# Patient Record
Sex: Male | Born: 1960 | Race: White | Hispanic: No | Marital: Single | Smoking: Current every day smoker
Health system: Southern US, Community
[De-identification: ages and names within clinical notes are randomized; demographics above are authoritative.]

## PROBLEM LIST (undated history)

## (undated) DIAGNOSIS — R079 Chest pain, unspecified: Secondary | ICD-10-CM

## (undated) DIAGNOSIS — I10 Essential (primary) hypertension: Secondary | ICD-10-CM

## (undated) DIAGNOSIS — F329 Major depressive disorder, single episode, unspecified: Secondary | ICD-10-CM

## (undated) DIAGNOSIS — M549 Dorsalgia, unspecified: Secondary | ICD-10-CM

## (undated) DIAGNOSIS — N200 Calculus of kidney: Secondary | ICD-10-CM

## (undated) DIAGNOSIS — F32A Depression, unspecified: Secondary | ICD-10-CM

## (undated) DIAGNOSIS — K219 Gastro-esophageal reflux disease without esophagitis: Secondary | ICD-10-CM

## (undated) DIAGNOSIS — M5030 Other cervical disc degeneration, unspecified cervical region: Secondary | ICD-10-CM

## (undated) DIAGNOSIS — Z87442 Personal history of urinary calculi: Secondary | ICD-10-CM

## (undated) DIAGNOSIS — I82409 Acute embolism and thrombosis of unspecified deep veins of unspecified lower extremity: Secondary | ICD-10-CM

## (undated) HISTORY — DX: Depression, unspecified: F32.A

## (undated) HISTORY — DX: Chest pain, unspecified: R07.9

## (undated) HISTORY — DX: Essential (primary) hypertension: I10

## (undated) HISTORY — DX: Dorsalgia, unspecified: M54.9

## (undated) HISTORY — DX: Calculus of kidney: N20.0

## (undated) HISTORY — PX: VASECTOMY: SHX75

## (undated) HISTORY — DX: Gastro-esophageal reflux disease without esophagitis: K21.9

## (undated) HISTORY — DX: Other cervical disc degeneration, unspecified cervical region: M50.30

## (undated) HISTORY — DX: Acute embolism and thrombosis of unspecified deep veins of unspecified lower extremity: I82.409

## (undated) HISTORY — DX: Major depressive disorder, single episode, unspecified: F32.9

---

## 2005-06-01 ENCOUNTER — Emergency Department (HOSPITAL_COMMUNITY): Admission: EM | Admit: 2005-06-01 | Discharge: 2005-06-01 | Payer: Self-pay | Admitting: Emergency Medicine

## 2005-06-09 ENCOUNTER — Ambulatory Visit (HOSPITAL_COMMUNITY): Admission: RE | Admit: 2005-06-09 | Discharge: 2005-06-09 | Payer: Self-pay | Admitting: Urology

## 2008-05-23 ENCOUNTER — Emergency Department (HOSPITAL_BASED_OUTPATIENT_CLINIC_OR_DEPARTMENT_OTHER): Admission: EM | Admit: 2008-05-23 | Discharge: 2008-05-23 | Payer: Self-pay | Admitting: Emergency Medicine

## 2012-10-25 ENCOUNTER — Encounter: Payer: Self-pay | Admitting: Cardiovascular Disease

## 2012-10-25 ENCOUNTER — Encounter: Payer: Self-pay | Admitting: *Deleted

## 2012-10-27 ENCOUNTER — Ambulatory Visit (INDEPENDENT_AMBULATORY_CARE_PROVIDER_SITE_OTHER): Payer: BC Managed Care – PPO | Admitting: Cardiovascular Disease

## 2012-10-27 ENCOUNTER — Encounter: Payer: Self-pay | Admitting: Cardiovascular Disease

## 2012-10-27 VITALS — BP 130/82 | HR 65 | Ht 71.0 in | Wt 232.1 lb

## 2012-10-27 DIAGNOSIS — M549 Dorsalgia, unspecified: Secondary | ICD-10-CM | POA: Insufficient documentation

## 2012-10-27 DIAGNOSIS — R0602 Shortness of breath: Secondary | ICD-10-CM

## 2012-10-27 DIAGNOSIS — I10 Essential (primary) hypertension: Secondary | ICD-10-CM

## 2012-10-27 DIAGNOSIS — R079 Chest pain, unspecified: Secondary | ICD-10-CM

## 2012-10-27 NOTE — Progress Notes (Signed)
Patient ID: Jeffery Gross, male   DOB: 04/12/1961, 52 y.o.   MRN: [id number] 52 yo referred by Prudy Feeler PA for chest pain and dyspnea.  Has chronic back pain and may need lumbar fusion with Dr Channing Mutters.  Chronic problem over past year. 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  Smokes occasional cigar.  Pleuritic like pain in chest when he breathes it feels tight. Pain not necessarily exertional Can get it sitting on couch if he takes deep breath.  Intermitant but persistant and thinks its getting worse over last 2 months. No help with inhalers. No trauma, CT disease or other arthritides.  CRF;s HTN on Rx  ROS: Denies fever, malais, weight loss, blurry vision, decreased visual acuity, cough, sputum, SOB, hemoptysis, pleuritic pain, palpitaitons, heartburn, abdominal pain, melena, lower extremity edema, claudication, or rash.  All other systems reviewed and negative   General: Affect appropriate Healthy:  appears stated age HEENT: normal Neck supple with no adenopathy JVP normal no bruits no thyromegaly Lungs clear with no wheezing and good diaphragmatic motion Heart:  S1/S2 no murmur,rub, gallop or click PMI normal Abdomen: benighn, BS positve, no tenderness, no AAA no bruit.  No HSM or HJR Distal pulses intact with no bruits No edema Neuro non-focal Skin warm and dry No muscular weakness  Medications Current Outpatient Prescriptions  Medication Sig Dispense Refill  . ALPRAZolam (XANAX) 0.5 MG tablet Take 0.5 mg by mouth at bedtime as needed for sleep.      Marland Kitchen amitriptyline (ELAVIL) 50 MG tablet Take 50 mg by mouth at bedtime.      Marland Kitchen amLODipine (NORVASC) 10 MG tablet Take 10 mg by mouth daily.      . cetirizine (ZYRTEC) 10 MG tablet Take 10 mg by mouth daily.      . citalopram (CELEXA) 40 MG tablet Take 40 mg by mouth daily.      . clonazePAM (KLONOPIN) 0.5 MG tablet Take 0.5 mg by mouth 2 (two) times daily as needed for anxiety.      Marland Kitchen doxycycline (VIBRAMYCIN) 100 MG  capsule Take 100 mg by mouth 2 (two) times daily. For 10 days      . hydrochlorothiazide (HYDRODIURIL) 25 MG tablet Take 25 mg by mouth daily.      Marland Kitchen HYDROcodone-acetaminophen (NORCO) 10-325 MG per tablet Take 1 tablet by mouth every 6 (six) hours as needed for pain.       No current facility-administered medications for this visit.    Allergies Sulfacetamide sodium  Family History: No family history on file.  Social History: History   Social History  . Marital Status: Divorced    Spouse Name: N/A    Number of Children: N/A  . Years of Education: N/A   Occupational History  . Not on file.   Social History Main Topics  . Smoking status: Current Some Day Smoker  . Smokeless tobacco: Not on file  . Alcohol Use: Not on file  . Drug Use: Not on file  . Sexually Active: Not on file   Other Topics Concern  . Not on file   Social History Narrative  . No narrative on file    Electrocardiogram:  NSR rate 65 normal ECG  Assessment and Plan

## 2012-10-27 NOTE — Assessment & Plan Note (Signed)
If tests show no significant cardiopulmonary disease f/u with Dr Channing Mutters in [place] to consider spinal fusion

## 2012-10-27 NOTE — Assessment & Plan Note (Signed)
More pleuritic and associated with difficulty breathing  Needs back surgery. Unable to walk on treadmill due to back pain.  F/U lexiscan myovue.  Will order CXR and PFT;s as well.

## 2012-10-27 NOTE — Patient Instructions (Signed)
Your physician recommends that you schedule a follow-up appointment in:AS NEEDED Your physician recommends that you continue on your current medications as directed. Please refer to the Current Medication list given to you today.  Your physician has recommended that you have a pulmonary function test. Pulmonary Function Tests are a group of tests that measure how well air moves in and out of your lungs.  A chest x-ray takes a picture of the organs and structures inside the chest, including the heart, lungs, and blood vessels. This test can show several things, including, whether the heart is enlarges; whether fluid is building up in the lungs; and whether pacemaker / defibrillator leads are still in place. Your physician has requested that you have a lexiscan myoview. For further information please visit [email or web address] Please follow instruction sheet, as given.

## 2012-10-27 NOTE — Assessment & Plan Note (Signed)
Well controlled.  Continue current medications and low sodium Dash type diet.    

## 2012-10-28 ENCOUNTER — Ambulatory Visit (HOSPITAL_COMMUNITY)
Admission: RE | Admit: 2012-10-28 | Discharge: 2012-10-28 | Disposition: A | Discharge status: Home / Self Care | Payer: BC Managed Care – PPO | Source: Ambulatory Visit | Attending: Cardiovascular Disease | Admitting: Cardiovascular Disease

## 2012-10-28 DIAGNOSIS — R05 Cough: Secondary | ICD-10-CM | POA: Insufficient documentation

## 2012-10-28 DIAGNOSIS — R0602 Shortness of breath: Secondary | ICD-10-CM | POA: Insufficient documentation

## 2012-10-28 DIAGNOSIS — I10 Essential (primary) hypertension: Secondary | ICD-10-CM | POA: Insufficient documentation

## 2012-10-28 DIAGNOSIS — F17200 Nicotine dependence, unspecified, uncomplicated: Secondary | ICD-10-CM | POA: Insufficient documentation

## 2012-10-28 DIAGNOSIS — R059 Cough, unspecified: Secondary | ICD-10-CM | POA: Insufficient documentation

## 2012-10-28 MED ORDER — ALBUTEROL SULFATE (5 MG/ML) 0.5% IN NEBU
2.5000 mg | INHALATION_SOLUTION | Freq: Once | RESPIRATORY_TRACT | Status: AC
  Administered 2012-10-28: 2.5 mg via RESPIRATORY_TRACT

## 2012-11-02 ENCOUNTER — Telehealth: Payer: Self-pay | Admitting: *Deleted

## 2012-11-02 ENCOUNTER — Telehealth: Payer: Self-pay | Admitting: Cardiovascular Disease

## 2012-11-02 NOTE — Telephone Encounter (Signed)
Follow up  ° ° ° °Returning call back to nurse  °

## 2012-11-02 NOTE — Telephone Encounter (Signed)
PT'S SIG OTHER   AWARE OF CXR RESULTS .Zack Seal

## 2012-11-02 NOTE — Telephone Encounter (Signed)
PT AWARE OF PFT RESULTS OKAY PER  DR Eden Emms .Jeffery Gross

## 2012-11-03 ENCOUNTER — Encounter (HOSPITAL_COMMUNITY): Payer: BC Managed Care – PPO

## 2012-11-16 ENCOUNTER — Ambulatory Visit (HOSPITAL_COMMUNITY): Payer: BC Managed Care – PPO | Attending: Cardiology | Admitting: Radiology

## 2012-11-16 VITALS — BP 129/99 | Ht 72.0 in | Wt 228.0 lb

## 2012-11-16 DIAGNOSIS — Z01810 Encounter for preprocedural cardiovascular examination: Secondary | ICD-10-CM | POA: Insufficient documentation

## 2012-11-16 DIAGNOSIS — R079 Chest pain, unspecified: Secondary | ICD-10-CM | POA: Insufficient documentation

## 2012-11-16 DIAGNOSIS — I10 Essential (primary) hypertension: Secondary | ICD-10-CM | POA: Insufficient documentation

## 2012-11-16 DIAGNOSIS — F17200 Nicotine dependence, unspecified, uncomplicated: Secondary | ICD-10-CM | POA: Insufficient documentation

## 2012-11-16 MED ORDER — TECHNETIUM TC 99M SESTAMIBI GENERIC - CARDIOLITE
11.0000 | Freq: Once | INTRAVENOUS | Status: AC | PRN
  Administered 2012-11-16: 11 via INTRAVENOUS

## 2012-11-16 MED ORDER — REGADENOSON 0.4 MG/5ML IV SOLN
0.4000 mg | Freq: Once | INTRAVENOUS | Status: AC
  Administered 2012-11-16: 0.4 mg via INTRAVENOUS

## 2012-11-16 MED ORDER — TECHNETIUM TC 99M SESTAMIBI GENERIC - CARDIOLITE
33.0000 | Freq: Once | INTRAVENOUS | Status: AC | PRN
  Administered 2012-11-16: 33 via INTRAVENOUS

## 2012-11-16 NOTE — Progress Notes (Signed)
[facility] SITE 3 NUCLEAR MED [address] [place], [place] [address] [phone]    Cardiology Nuclear Med Study  Jeffery Gross is a 52 y.o. male     MRN : [id number]     DOB: 11-Jul-1961  Procedure Date: 11/16/2012  Nuclear Med Background Indication for Stress Test:  Evaluation for Ischemia , and Pending Surgical Clearance for  Back surgery by Dr. Channing Mutters History:  No CAD Hx Cardiac Risk Factors: Hypertension and Smoker  Symptoms:  Chest Pain   Nuclear Pre-Procedure Caffeine/Decaff Intake:  None > 12 hrs NPO After: 8:00am   Lungs:  clear O2 Sat: 94% on room air. IV 0.9% NS with Angio Cath:  20g  IV Site: R Antecubital x 1, tolerated well IV Started by:  Irean Hong, RN  Chest Size (in):  46 Cup Size: n/a  Height: 6' (1.829 m)  Weight:  228 lb (103.42 kg)  BMI:  Body mass index is 30.92 kg/(m^2). Tech Comments:  n/a    Nuclear Med Study 1 or 2 day study: 1 day  Stress Test Type:  Lexiscan  Reading MD: Willa Rough, MD  Order Authorizing Provider:  Charlton Haws, MD  Resting Radionuclide: Technetium 48m Sestamibi  Resting Radionuclide Dose: 11.0 mCi   Stress Radionuclide:  Technetium 54m Sestamibi  Stress Radionuclide Dose: 33.0 mCi           Stress Protocol Rest HR: 61 Stress HR: 88  Rest BP: 129/99 Stress BP: 139/82  Exercise Time (min): n/a METS: n/a   Predicted Max HR: 169 bpm % Max HR: 52.07 bpm Rate Pressure Product: 21308   Dose of Adenosine (mg):  n/a Dose of Lexiscan: 0.4 mg  Dose of Atropine (mg): n/a Dose of Dobutamine: n/a mcg/kg/min (at max HR)  Stress Test Technologist: Milana Na, EMT-P  Nuclear Technologist:  Domenic Polite, CNMT     Rest Procedure:  Myocardial perfusion imaging was performed at rest 45 minutes following the intravenous administration of Technetium 34m Sestamibi. Rest ECG: Normal EKG  Stress Procedure:  The patient received IV Lexiscan 0.4 mg over 15-seconds.  Technetium 44m Sestamibi injected at 30-seconds.  This patient had sob and was dizzy with the Lexiscan injection. Quantitative spect images were obtained after a 45 minute delay. Stress ECG: No significant change from baseline ECG  QPS Raw Data Images:  Patient motion noted; appropriate software correction applied. Stress Images:  Normal homogeneous uptake in all areas of the myocardium. Rest Images:  Normal homogeneous uptake in all areas of the myocardium. Subtraction (SDS):  No evidence of ischemia. Transient Ischemic Dilatation (Normal <1.22):  1.05 Lung/Heart Ratio (Normal <0.45):  0.10  Quantitative Gated Spect Images QGS EDV:  126 ml QGS ESV:  55 ml  Impression Exercise Capacity:  Lexiscan with no exercise. BP Response:  Normal blood pressure response. Clinical Symptoms:  Shortness of breath ECG Impression:  No significant ST segment change suggestive of ischemia. Comparison with Prior Nuclear Study: No images to compare  Overall Impression:  Normal stress nuclear study.  LV Ejection Fraction: 56%.  LV Wall Motion:  NL LV Function; NL Wall Motion.  Willa Rough, MD

## 2016-01-03 ENCOUNTER — Inpatient Hospital Stay (HOSPITAL_COMMUNITY): Payer: BLUE CROSS/BLUE SHIELD | Admitting: Anesthesiology

## 2016-01-03 ENCOUNTER — Encounter (HOSPITAL_COMMUNITY): Payer: Self-pay | Admitting: *Deleted

## 2016-01-03 ENCOUNTER — Encounter (HOSPITAL_COMMUNITY)
Admission: EM | Disposition: A | Discharge status: Rehabilitation Facility | Payer: Self-pay | Source: Home / Self Care | Attending: Family Medicine

## 2016-01-03 ENCOUNTER — Inpatient Hospital Stay (HOSPITAL_COMMUNITY): Payer: BLUE CROSS/BLUE SHIELD

## 2016-01-03 ENCOUNTER — Emergency Department (HOSPITAL_COMMUNITY): Payer: BLUE CROSS/BLUE SHIELD

## 2016-01-03 ENCOUNTER — Emergency Department (HOSPITAL_COMMUNITY)
Admit: 2016-01-03 | Discharge: 2016-01-03 | Disposition: A | Discharge status: Home / Self Care | Payer: BLUE CROSS/BLUE SHIELD | Attending: Emergency Medicine | Admitting: Emergency Medicine

## 2016-01-03 ENCOUNTER — Inpatient Hospital Stay (HOSPITAL_COMMUNITY)
Admission: EM | Admit: 2016-01-03 | Discharge: 2016-01-23 | DRG: 907 | Disposition: A | Discharge status: Rehabilitation Facility | Payer: BLUE CROSS/BLUE SHIELD | Attending: Family Medicine | Admitting: Family Medicine

## 2016-01-03 DIAGNOSIS — R300 Dysuria: Secondary | ICD-10-CM | POA: Insufficient documentation

## 2016-01-03 DIAGNOSIS — T796XXA Traumatic ischemia of muscle, initial encounter: Secondary | ICD-10-CM | POA: Diagnosis not present

## 2016-01-03 DIAGNOSIS — R7401 Elevation of levels of liver transaminase levels: Secondary | ICD-10-CM | POA: Insufficient documentation

## 2016-01-03 DIAGNOSIS — T79A22A Traumatic compartment syndrome of left lower extremity, initial encounter: Principal | ICD-10-CM | POA: Diagnosis present

## 2016-01-03 DIAGNOSIS — D638 Anemia in other chronic diseases classified elsewhere: Secondary | ICD-10-CM | POA: Diagnosis not present

## 2016-01-03 DIAGNOSIS — T79A21A Traumatic compartment syndrome of right lower extremity, initial encounter: Secondary | ICD-10-CM | POA: Diagnosis present

## 2016-01-03 DIAGNOSIS — I82431 Acute embolism and thrombosis of right popliteal vein: Secondary | ICD-10-CM | POA: Diagnosis present

## 2016-01-03 DIAGNOSIS — M545 Low back pain, unspecified: Secondary | ICD-10-CM | POA: Insufficient documentation

## 2016-01-03 DIAGNOSIS — F419 Anxiety disorder, unspecified: Secondary | ICD-10-CM | POA: Diagnosis present

## 2016-01-03 DIAGNOSIS — G8918 Other acute postprocedural pain: Secondary | ICD-10-CM | POA: Diagnosis not present

## 2016-01-03 DIAGNOSIS — Z89612 Acquired absence of left leg above knee: Secondary | ICD-10-CM | POA: Diagnosis not present

## 2016-01-03 DIAGNOSIS — N179 Acute kidney failure, unspecified: Secondary | ICD-10-CM | POA: Insufficient documentation

## 2016-01-03 DIAGNOSIS — F329 Major depressive disorder, single episode, unspecified: Secondary | ICD-10-CM | POA: Diagnosis present

## 2016-01-03 DIAGNOSIS — W109XXA Fall (on) (from) unspecified stairs and steps, initial encounter: Secondary | ICD-10-CM | POA: Diagnosis present

## 2016-01-03 DIAGNOSIS — Z452 Encounter for adjustment and management of vascular access device: Secondary | ICD-10-CM | POA: Insufficient documentation

## 2016-01-03 DIAGNOSIS — M6282 Rhabdomyolysis: Secondary | ICD-10-CM | POA: Diagnosis present

## 2016-01-03 DIAGNOSIS — M79A29 Nontraumatic compartment syndrome of unspecified lower extremity: Secondary | ICD-10-CM | POA: Diagnosis present

## 2016-01-03 DIAGNOSIS — E875 Hyperkalemia: Secondary | ICD-10-CM | POA: Diagnosis present

## 2016-01-03 DIAGNOSIS — M79605 Pain in left leg: Secondary | ICD-10-CM | POA: Diagnosis present

## 2016-01-03 DIAGNOSIS — E872 Acidosis: Secondary | ICD-10-CM | POA: Diagnosis present

## 2016-01-03 DIAGNOSIS — R740 Nonspecific elevation of levels of transaminase and lactic acid dehydrogenase [LDH]: Secondary | ICD-10-CM

## 2016-01-03 DIAGNOSIS — L259 Unspecified contact dermatitis, unspecified cause: Secondary | ICD-10-CM | POA: Diagnosis not present

## 2016-01-03 DIAGNOSIS — N170 Acute kidney failure with tubular necrosis: Secondary | ICD-10-CM | POA: Diagnosis not present

## 2016-01-03 DIAGNOSIS — E860 Dehydration: Secondary | ICD-10-CM | POA: Diagnosis present

## 2016-01-03 DIAGNOSIS — R109 Unspecified abdominal pain: Secondary | ICD-10-CM

## 2016-01-03 DIAGNOSIS — I10 Essential (primary) hypertension: Secondary | ICD-10-CM | POA: Diagnosis present

## 2016-01-03 DIAGNOSIS — K5903 Drug induced constipation: Secondary | ICD-10-CM | POA: Insufficient documentation

## 2016-01-03 DIAGNOSIS — E8770 Fluid overload, unspecified: Secondary | ICD-10-CM | POA: Diagnosis not present

## 2016-01-03 DIAGNOSIS — K5900 Constipation, unspecified: Secondary | ICD-10-CM | POA: Diagnosis present

## 2016-01-03 DIAGNOSIS — D62 Acute posthemorrhagic anemia: Secondary | ICD-10-CM | POA: Insufficient documentation

## 2016-01-03 DIAGNOSIS — R55 Syncope and collapse: Secondary | ICD-10-CM | POA: Insufficient documentation

## 2016-01-03 DIAGNOSIS — G8929 Other chronic pain: Secondary | ICD-10-CM | POA: Diagnosis present

## 2016-01-03 DIAGNOSIS — J45909 Unspecified asthma, uncomplicated: Secondary | ICD-10-CM | POA: Insufficient documentation

## 2016-01-03 DIAGNOSIS — T796XXS Traumatic ischemia of muscle, sequela: Secondary | ICD-10-CM | POA: Diagnosis not present

## 2016-01-03 DIAGNOSIS — R1084 Generalized abdominal pain: Secondary | ICD-10-CM | POA: Diagnosis not present

## 2016-01-03 DIAGNOSIS — L8990 Pressure ulcer of unspecified site, unspecified stage: Secondary | ICD-10-CM | POA: Insufficient documentation

## 2016-01-03 DIAGNOSIS — M549 Dorsalgia, unspecified: Secondary | ICD-10-CM | POA: Diagnosis present

## 2016-01-03 DIAGNOSIS — R339 Retention of urine, unspecified: Secondary | ICD-10-CM | POA: Diagnosis not present

## 2016-01-03 DIAGNOSIS — I82401 Acute embolism and thrombosis of unspecified deep veins of right lower extremity: Secondary | ICD-10-CM

## 2016-01-03 DIAGNOSIS — E8809 Other disorders of plasma-protein metabolism, not elsewhere classified: Secondary | ICD-10-CM | POA: Insufficient documentation

## 2016-01-03 DIAGNOSIS — Z720 Tobacco use: Secondary | ICD-10-CM | POA: Insufficient documentation

## 2016-01-03 DIAGNOSIS — F17200 Nicotine dependence, unspecified, uncomplicated: Secondary | ICD-10-CM | POA: Diagnosis present

## 2016-01-03 DIAGNOSIS — W19XXXA Unspecified fall, initial encounter: Secondary | ICD-10-CM | POA: Diagnosis not present

## 2016-01-03 DIAGNOSIS — G546 Phantom limb syndrome with pain: Secondary | ICD-10-CM | POA: Diagnosis not present

## 2016-01-03 DIAGNOSIS — M7989 Other specified soft tissue disorders: Secondary | ICD-10-CM

## 2016-01-03 DIAGNOSIS — E871 Hypo-osmolality and hyponatremia: Secondary | ICD-10-CM | POA: Diagnosis present

## 2016-01-03 DIAGNOSIS — W19XXXD Unspecified fall, subsequent encounter: Secondary | ICD-10-CM | POA: Diagnosis not present

## 2016-01-03 DIAGNOSIS — Z89611 Acquired absence of right leg above knee: Secondary | ICD-10-CM | POA: Diagnosis not present

## 2016-01-03 DIAGNOSIS — M79609 Pain in unspecified limb: Secondary | ICD-10-CM

## 2016-01-03 DIAGNOSIS — E46 Unspecified protein-calorie malnutrition: Secondary | ICD-10-CM | POA: Insufficient documentation

## 2016-01-03 DIAGNOSIS — N189 Chronic kidney disease, unspecified: Secondary | ICD-10-CM | POA: Insufficient documentation

## 2016-01-03 HISTORY — PX: FASCIOTOMY: SHX132

## 2016-01-03 LAB — I-STAT CHEM 8, ED
BUN: 68 mg/dL — ABNORMAL HIGH (ref 6–20)
Calcium, Ion: 0.74 mmol/L — ABNORMAL LOW (ref 1.12–1.23)
Chloride: 96 mmol/L — ABNORMAL LOW (ref 101–111)
Creatinine, Ser: 5.4 mg/dL — ABNORMAL HIGH (ref 0.61–1.24)
Glucose, Bld: 106 mg/dL — ABNORMAL HIGH (ref 65–99)
HCT: 54 % — ABNORMAL HIGH (ref 39.0–52.0)
Hemoglobin: 18.4 g/dL — ABNORMAL HIGH (ref 13.0–17.0)
Potassium: 5.5 mmol/L — ABNORMAL HIGH (ref 3.5–5.1)
Sodium: 122 mmol/L — ABNORMAL LOW (ref 135–145)
TCO2: 20 mmol/L (ref 0–100)

## 2016-01-03 LAB — SURGICAL PCR SCREEN
MRSA, PCR: POSITIVE — AB
Staphylococcus aureus: POSITIVE — AB

## 2016-01-03 LAB — CBC WITH DIFFERENTIAL/PLATELET
Basophils Absolute: 0 10*3/uL (ref 0.0–0.1)
Basophils Relative: 0 %
EOS PCT: 0 %
Eosinophils Absolute: 0 10*3/uL (ref 0.0–0.7)
HEMATOCRIT: 46.7 % (ref 39.0–52.0)
Hemoglobin: 16.6 g/dL (ref 13.0–17.0)
Lymphocytes Relative: 5 %
Lymphs Abs: 1 10*3/uL (ref 0.7–4.0)
MCH: 28 pg (ref 26.0–34.0)
MCHC: 35.5 g/dL (ref 30.0–36.0)
MCV: 78.9 fL (ref 78.0–100.0)
Monocytes Absolute: 2.2 10*3/uL — ABNORMAL HIGH (ref 0.1–1.0)
Monocytes Relative: 11 %
NEUTROS ABS: 16.7 10*3/uL — AB (ref 1.7–7.7)
NEUTROS PCT: 84 %
Platelets: 197 10*3/uL (ref 150–400)
RBC: 5.92 MIL/uL — ABNORMAL HIGH (ref 4.22–5.81)
RDW: 13 % (ref 11.5–15.5)
WBC: 20 10*3/uL — ABNORMAL HIGH (ref 4.0–10.5)

## 2016-01-03 LAB — I-STAT TROPONIN, ED: Troponin i, poc: 0 ng/mL (ref 0.00–0.08)

## 2016-01-03 LAB — URINALYSIS, ROUTINE W REFLEX MICROSCOPIC
Bilirubin Urine: NEGATIVE
Glucose, UA: 250 mg/dL — AB
Ketones, ur: NEGATIVE mg/dL
LEUKOCYTES UA: NEGATIVE
NITRITE: NEGATIVE
PH: 5.5 (ref 5.0–8.0)
Protein, ur: >300 mg/dL — AB
Specific Gravity, Urine: 1.024 (ref 1.005–1.030)

## 2016-01-03 LAB — URINE MICROSCOPIC-ADD ON

## 2016-01-03 LAB — GLUCOSE, CAPILLARY: GLUCOSE-CAPILLARY: 98 mg/dL (ref 65–99)

## 2016-01-03 LAB — I-STAT CG4 LACTIC ACID, ED
LACTIC ACID, VENOUS: 1.3 mmol/L (ref 0.5–2.0)
Lactic Acid, Venous: 2.72 mmol/L (ref 0.5–2.0)

## 2016-01-03 LAB — CK: Total CK: >50000 U/L — ABNORMAL HIGH (ref 49–397)

## 2016-01-03 SURGERY — FASCIOTOMY, UPPER EXTREMITY
Anesthesia: General | Laterality: Bilateral

## 2016-01-03 MED ORDER — LIDOCAINE HCL (CARDIAC) 20 MG/ML IV SOLN
INTRAVENOUS | Status: DC | PRN
  Administered 2016-01-03: 50 mg via INTRAVENOUS

## 2016-01-03 MED ORDER — ONDANSETRON HCL 4 MG/2ML IJ SOLN: 4.0000 mg | Freq: Once | INTRAMUSCULAR | Status: DC | PRN

## 2016-01-03 MED ORDER — FENTANYL CITRATE (PF) 250 MCG/5ML IJ SOLN
INTRAMUSCULAR | Status: AC
  Filled 2016-01-03: qty 5

## 2016-01-03 MED ORDER — OXYCODONE HCL 5 MG PO TABS
15.0000 mg | ORAL_TABLET | Freq: Three times a day (TID) | ORAL | Status: DC | PRN
  Administered 2016-01-03 – 2016-01-14 (×20): 15 mg via ORAL
  Filled 2016-01-03 (×23): qty 3

## 2016-01-03 MED ORDER — MORPHINE SULFATE (PF) 4 MG/ML IV SOLN
4.0000 mg | Freq: Once | INTRAVENOUS | Status: AC
  Administered 2016-01-03: 4 mg via INTRAVENOUS
  Filled 2016-01-03: qty 1

## 2016-01-03 MED ORDER — RIVAROXABAN 15 MG PO TABS: 15.0000 mg | ORAL_TABLET | Freq: Two times a day (BID) | ORAL | Status: DC

## 2016-01-03 MED ORDER — PROPOFOL 10 MG/ML IV BOLUS
INTRAVENOUS | Status: AC
  Filled 2016-01-03: qty 20

## 2016-01-03 MED ORDER — PROPOFOL 10 MG/ML IV BOLUS
INTRAVENOUS | Status: DC | PRN
  Administered 2016-01-03: 180 mg via INTRAVENOUS

## 2016-01-03 MED ORDER — LACTATED RINGERS IV SOLN
INTRAVENOUS | Status: DC
  Administered 2016-01-03: 18:00:00 via INTRAVENOUS

## 2016-01-03 MED ORDER — SODIUM CHLORIDE 0.9 % IV BOLUS (SEPSIS)
1000.0000 mL | Freq: Once | INTRAVENOUS | Status: AC
  Administered 2016-01-03: 1000 mL via INTRAVENOUS

## 2016-01-03 MED ORDER — ONDANSETRON HCL 4 MG/2ML IJ SOLN
4.0000 mg | Freq: Once | INTRAMUSCULAR | Status: AC
  Administered 2016-01-03: 4 mg via INTRAVENOUS
  Filled 2016-01-03: qty 2

## 2016-01-03 MED ORDER — CITALOPRAM HYDROBROMIDE 40 MG PO TABS
40.0000 mg | ORAL_TABLET | Freq: Every day | ORAL | Status: DC
  Administered 2016-01-04 – 2016-01-12 (×8): 40 mg via ORAL
  Filled 2016-01-03: qty 1
  Filled 2016-01-03 (×4): qty 2
  Filled 2016-01-03: qty 1
  Filled 2016-01-03: qty 2
  Filled 2016-01-03: qty 1

## 2016-01-03 MED ORDER — AMITRIPTYLINE HCL 50 MG PO TABS
50.0000 mg | ORAL_TABLET | Freq: Every day | ORAL | Status: DC
  Administered 2016-01-03 – 2016-01-22 (×20): 50 mg via ORAL
  Filled 2016-01-03 (×20): qty 1

## 2016-01-03 MED ORDER — AMLODIPINE BESYLATE 10 MG PO TABS
10.0000 mg | ORAL_TABLET | Freq: Every day | ORAL | Status: DC
  Administered 2016-01-04 – 2016-01-05 (×2): 10 mg via ORAL
  Filled 2016-01-03 (×2): qty 1

## 2016-01-03 MED ORDER — SODIUM CHLORIDE 0.9 % IR SOLN
Status: DC | PRN
  Administered 2016-01-03 (×2): 3000 mL

## 2016-01-03 MED ORDER — FENTANYL CITRATE (PF) 100 MCG/2ML IJ SOLN
INTRAMUSCULAR | Status: DC | PRN
  Administered 2016-01-03: 50 ug via INTRAVENOUS
  Administered 2016-01-03: 100 ug via INTRAVENOUS
  Administered 2016-01-03: 50 ug via INTRAVENOUS

## 2016-01-03 MED ORDER — PANTOPRAZOLE SODIUM 40 MG PO TBEC
40.0000 mg | DELAYED_RELEASE_TABLET | Freq: Every day | ORAL | Status: DC
  Administered 2016-01-04 – 2016-01-23 (×19): 40 mg via ORAL
  Filled 2016-01-03 (×19): qty 1

## 2016-01-03 MED ORDER — SODIUM CHLORIDE 0.9 % IR SOLN
Status: DC | PRN
  Administered 2016-01-03: 1000 mL

## 2016-01-03 MED ORDER — HYDROMORPHONE HCL 1 MG/ML IJ SOLN: 0.2500 mg | INTRAMUSCULAR | Status: DC | PRN

## 2016-01-03 MED ORDER — ONDANSETRON HCL 4 MG/2ML IJ SOLN
INTRAMUSCULAR | Status: AC
Start: 2016-01-03 — End: 2016-01-03
  Filled 2016-01-03: qty 4

## 2016-01-03 MED ORDER — IOPAMIDOL (ISOVUE-300) INJECTION 61%
INTRAVENOUS | Status: AC
  Filled 2016-01-03: qty 100

## 2016-01-03 MED ORDER — ONDANSETRON HCL 4 MG/2ML IJ SOLN
INTRAMUSCULAR | Status: DC | PRN
  Administered 2016-01-03: 4 mg via INTRAVENOUS

## 2016-01-03 MED ORDER — SUCCINYLCHOLINE CHLORIDE 20 MG/ML IJ SOLN
INTRAMUSCULAR | Status: DC | PRN
  Administered 2016-01-03: 120 mg via INTRAVENOUS

## 2016-01-03 MED ORDER — HEPARIN (PORCINE) IN NACL 100-0.45 UNIT/ML-% IJ SOLN
1950.0000 [IU]/h | INTRAMUSCULAR | Status: AC
  Administered 2016-01-03: 1650 [IU]/h via INTRAVENOUS
  Administered 2016-01-04: 1950 [IU]/h via INTRAVENOUS
  Filled 2016-01-03 (×2): qty 250

## 2016-01-03 MED ORDER — MIDAZOLAM HCL 2 MG/2ML IJ SOLN
INTRAMUSCULAR | Status: AC
  Filled 2016-01-03: qty 2

## 2016-01-03 MED ORDER — CEFAZOLIN SODIUM-DEXTROSE 2-4 GM/100ML-% IV SOLN
2.0000 g | INTRAVENOUS | Status: AC
  Administered 2016-01-03: 2 g via INTRAVENOUS
  Filled 2016-01-03 (×2): qty 100

## 2016-01-03 MED ORDER — RISPERIDONE 1 MG PO TABS
1.0000 mg | ORAL_TABLET | Freq: Every day | ORAL | Status: DC
  Administered 2016-01-03 – 2016-01-22 (×20): 1 mg via ORAL
  Filled 2016-01-03 (×23): qty 1

## 2016-01-03 MED ORDER — MIDAZOLAM HCL 5 MG/5ML IJ SOLN
INTRAMUSCULAR | Status: DC | PRN
  Administered 2016-01-03: 2 mg via INTRAVENOUS

## 2016-01-03 MED ORDER — SODIUM CHLORIDE 0.9 % IV SOLN
INTRAVENOUS | Status: DC
  Administered 2016-01-03 – 2016-01-04 (×3): via INTRAVENOUS

## 2016-01-03 SURGICAL SUPPLY — 64 items
BANDAGE ELASTIC 3 VELCRO ST LF (GAUZE/BANDAGES/DRESSINGS) IMPLANT
BANDAGE ESMARK 6X9 LF (GAUZE/BANDAGES/DRESSINGS) IMPLANT
BLADE SURG 10 STRL SS (BLADE) ×3 IMPLANT
BNDG CMPR 9X6 STRL LF SNTH (GAUZE/BANDAGES/DRESSINGS) ×2
BNDG COHESIVE 1X5 TAN STRL LF (GAUZE/BANDAGES/DRESSINGS) IMPLANT
BNDG COHESIVE 4X5 TAN STRL (GAUZE/BANDAGES/DRESSINGS) ×5 IMPLANT
BNDG COHESIVE 6X5 TAN STRL LF (GAUZE/BANDAGES/DRESSINGS) ×6 IMPLANT
BNDG CONFORM 3 STRL LF (GAUZE/BANDAGES/DRESSINGS) IMPLANT
BNDG ESMARK 6X9 LF (GAUZE/BANDAGES/DRESSINGS) ×6
BNDG GAUZE STRTCH 6 (GAUZE/BANDAGES/DRESSINGS) ×3 IMPLANT
CORDS BIPOLAR (ELECTRODE) IMPLANT
COVER SURGICAL LIGHT HANDLE (MISCELLANEOUS) ×3 IMPLANT
CUFF TOURNIQUET SINGLE 24IN (TOURNIQUET CUFF) IMPLANT
CUFF TOURNIQUET SINGLE 34IN LL (TOURNIQUET CUFF) ×6 IMPLANT
CUFF TOURNIQUET SINGLE 44IN (TOURNIQUET CUFF) IMPLANT
DRAPE EXTREMITY BILATERAL (DRAPES) ×2 IMPLANT
DRAPE IMP U-DRAPE 54X76 (DRAPES) ×4 IMPLANT
DRAPE INCISE IOBAN 66X45 STRL (DRAPES) ×12 IMPLANT
DRAPE SURG 17X23 STRL (DRAPES) IMPLANT
DRAPE U-SHAPE 47X51 STRL (DRAPES) ×5 IMPLANT
DURAPREP 26ML APPLICATOR (WOUND CARE) ×5 IMPLANT
ELECT CAUTERY BLADE 6.4 (BLADE) ×5 IMPLANT
ELECT REM PT RETURN 9FT ADLT (ELECTROSURGICAL) ×3
ELECTRODE REM PT RTRN 9FT ADLT (ELECTROSURGICAL) IMPLANT
FACESHIELD WRAPAROUND (MASK) IMPLANT
FACESHIELD WRAPAROUND OR TEAM (MASK) IMPLANT
GAUZE SPONGE 4X4 12PLY STRL (GAUZE/BANDAGES/DRESSINGS) ×2 IMPLANT
GAUZE XEROFORM 1X8 LF (GAUZE/BANDAGES/DRESSINGS) ×1 IMPLANT
GAUZE XEROFORM 5X9 LF (GAUZE/BANDAGES/DRESSINGS) ×1 IMPLANT
GLOVE SKINSENSE NS SZ7.5 (GLOVE) ×6
GLOVE SKINSENSE STRL SZ7.5 (GLOVE) ×2 IMPLANT
GOWN STRL REIN XL XLG (GOWN DISPOSABLE) ×6 IMPLANT
HANDPIECE INTERPULSE COAX TIP (DISPOSABLE)
KIT BASIN OR (CUSTOM PROCEDURE TRAY) ×3 IMPLANT
KIT ROOM TURNOVER OR (KITS) ×3 IMPLANT
MANIFOLD NEPTUNE II (INSTRUMENTS) ×3 IMPLANT
NS IRRIG 1000ML POUR BTL (IV SOLUTION) ×6 IMPLANT
PACK ORTHO EXTREMITY (CUSTOM PROCEDURE TRAY) ×3 IMPLANT
PAD ABD 8X10 STRL (GAUZE/BANDAGES/DRESSINGS) ×3 IMPLANT
PAD ARMBOARD 7.5X6 YLW CONV (MISCELLANEOUS) ×6 IMPLANT
PADDING CAST ABS 4INX4YD NS (CAST SUPPLIES)
PADDING CAST ABS COTTON 4X4 ST (CAST SUPPLIES) ×2 IMPLANT
PADDING CAST COTTON 6X4 STRL (CAST SUPPLIES) ×1 IMPLANT
SET CYSTO W/LG BORE CLAMP LF (SET/KITS/TRAYS/PACK) ×2 IMPLANT
SET HNDPC FAN SPRY TIP SCT (DISPOSABLE) IMPLANT
SPONGE LAP 18X18 X RAY DECT (DISPOSABLE) ×6 IMPLANT
STOCKINETTE IMPERVIOUS 9X36 MD (GAUZE/BANDAGES/DRESSINGS) ×1 IMPLANT
STOCKINETTE IMPERVIOUS LG (DRAPES) ×4 IMPLANT
SUT ETHILON 2 0 FS 18 (SUTURE) ×9 IMPLANT
SUT ETHILON 2 0 PSLX (SUTURE) ×1 IMPLANT
SUT ETHILON 3 0 PS 1 (SUTURE) ×2 IMPLANT
SUT VIC AB 2-0 CT1 36 (SUTURE) ×1 IMPLANT
SUT VIC AB 2-0 FS1 27 (SUTURE) ×2 IMPLANT
SYR CONTROL 10ML LL (SYRINGE) IMPLANT
TOWEL OR 17X24 6PK STRL BLUE (TOWEL DISPOSABLE) ×3 IMPLANT
TOWEL OR 17X26 10 PK STRL BLUE (TOWEL DISPOSABLE) ×3 IMPLANT
TUBE ANAEROBIC SPECIMEN COL (MISCELLANEOUS) IMPLANT
TUBE CONNECTING 12'X1/4 (SUCTIONS) ×1
TUBE CONNECTING 12X1/4 (SUCTIONS) ×2 IMPLANT
TUBE FEEDING 5FR 15 INCH (TUBING) IMPLANT
TUBING CYSTO DISP (UROLOGICAL SUPPLIES) ×3 IMPLANT
UNDERPAD 30X30 INCONTINENT (UNDERPADS AND DIAPERS) ×6 IMPLANT
WATER STERILE IRR 1000ML POUR (IV SOLUTION) ×3 IMPLANT
YANKAUER SUCT BULB TIP NO VENT (SUCTIONS) ×3 IMPLANT

## 2016-01-03 NOTE — Progress Notes (Signed)
Pt admitted to the unit at 1627. Pt mental status is A&O x4. Pt oriented to room, staff, and call bell. Skin is intact except where otherwise charted. Full assessment charted in [facility]. Call bell within reach. Visitor guidelines reviewed w/ pt and/or family.

## 2016-01-03 NOTE — Op Note (Addendum)
   Date of Surgery: 01/03/2016  INDICATIONS: [name] is a 55 y.o.-year-old male who sustained bilateral lower leg compartment syndrome from rhabdomyolysis; he was indicated for fasciotomy due to his compartment syndrome and came to the operating room today for this procedure. The patient did consent to the procedure after discussion of the risks and benefits.   PREOPERATIVE DIAGNOSIS: bilateral lower leg compartment syndrome  POSTOPERATIVE DIAGNOSIS: Same.  PROCEDURE:  1. Bilateral lower leg Four-compartment fasciotomy bilateral lower leg with debridement of nonviable muscle 2. Application of wound vac >50 sq cm  SURGEON: N. Glee ArvinMichael Whit Bruni, M.D.  ASSISTANT: none  ANESTHESIA: general  IV FLUIDS AND URINE: See anesthesia.  ESTIMATED BLOOD LOSS: minimal mL.  IMPLANTS: None  DRAINS: None.  COMPLICATIONS: None.  DESCRIPTION OF PROCEDURE: The patient was brought to the operating room and placed supine on the operating table.  The patient had been signed prior to the procedure and this was documented. The patient had the anesthesia placed by the anesthesiologist.  The prep verification and incision time-outs were performed to confirm that this was the correct patient, site, side and location. The patient had SCDs in place on the opposite lower extremity. The patient did receive antibiotics prior to the incision and was redosed during the procedure as needed at indicated intervals.  The lower extremity was prepped and draped in the standard fashion.  The bony landmarks were palpated and the incisions were marked on the skin. The incisions were taken down through the skin and subcutaneous tissue and fascia for all compartments was opened extensively.  All compartments were found to be released and pressure visually relieved.  Nonviable muscle was debrided with rongeur from each of the compartments.  There was only a small amount of nonviable muscle from each compartment that was debrided.  The  wounds were copiously irrigated with saline, then cleaned and dried a final time and a sterile dressing consisting of the KCI-VAC was placed.  The patient was then wrapped in an ACE. The patient was then transferred back to the bed and left the operating room in stable condition.  All sponge and instrument counts were correct.  POSTOPERATIVE PLAN: [name] will remain non-weight bearing with the leg elevated.  he will return to the operating room in 48-72 hrs when the swelling has gone down.  [name] will receive DVT prophylaxis based on other medications, activity level, and risk ratio of bleeding to thrombosis per the primary team; if possible, we would prefer subcutaneous heparin.

## 2016-01-03 NOTE — ED Notes (Signed)
Patient transported to CT 

## 2016-01-03 NOTE — Anesthesia Postprocedure Evaluation (Signed)
Anesthesia Post Note  Patient: Jeffery Gross  Procedure(s) Performed: Procedure(s) (LRB): FASCIOTOMY (Bilateral)  Patient location during evaluation: PACU Anesthesia Type: General Level of consciousness: awake and alert Pain management: pain level controlled Vital Signs Assessment: post-procedure vital signs reviewed and stable Respiratory status: spontaneous breathing, nonlabored ventilation, respiratory function stable and patient connected to nasal cannula oxygen Cardiovascular status: blood pressure returned to baseline and stable Postop Assessment: no signs of nausea or vomiting Anesthetic complications: no    Last Vitals:  Filed Vitals:   01/03/16 1951 01/03/16 2002  BP: 146/81 116/81  Pulse: 93 101  Temp: 36.6 C   Resp: 13 19    Last Pain:  Filed Vitals:   01/03/16 2003  PainSc: 0-No pain                 Kalvin Buss,JAMES TERRILL

## 2016-01-03 NOTE — Anesthesia Preprocedure Evaluation (Signed)
Anesthesia Evaluation  Patient identified by MRN, date of birth, ID band Patient awake    Reviewed: Allergy & Precautions, NPO status , Patient's Chart, lab work & pertinent test results  Airway Mallampati: II  TM Distance: >3 FB Neck ROM: Full    Dental  (+) Teeth Intact, Dental Advisory Given   Pulmonary Current Smoker,    breath sounds clear to auscultation       Cardiovascular hypertension,  Rhythm:Regular Rate:Normal     Neuro/Psych    GI/Hepatic   Endo/Other    Renal/GU      Musculoskeletal   Abdominal   Peds  Hematology   Anesthesia Other Findings   Reproductive/Obstetrics                             Anesthesia Physical Anesthesia Plan  ASA: III and emergent  Anesthesia Plan: General   Post-op Pain Management:    Induction: Intravenous and Rapid sequence  Airway Management Planned: Oral ETT  Additional Equipment:   Intra-op Plan:   Post-operative Plan: Extubation in OR  Informed Consent: I have reviewed the patients History and Physical, chart, labs and discussed the procedure including the risks, benefits and alternatives for the proposed anesthesia with the patient or authorized representative who has indicated his/her understanding and acceptance.   Dental advisory given  Plan Discussed with: CRNA and Anesthesiologist  Anesthesia Plan Comments:         Anesthesia Quick Evaluation

## 2016-01-03 NOTE — Anesthesia Procedure Notes (Signed)
Procedure Name: Intubation Date/Time: 01/03/2016 5:58 PM Performed by: Little IshikawaMERCER, Chastidy Ranker L Pre-anesthesia Checklist: Patient identified, Timeout performed, Emergency Drugs available, Suction available and Patient being monitored Patient Re-evaluated:Patient Re-evaluated prior to inductionOxygen Delivery Method: Circle system utilized Preoxygenation: Pre-oxygenation with 100% oxygen Intubation Type: IV induction Ventilation: Mask ventilation without difficulty Laryngoscope Size: Glidescope Grade View: Grade I Tube type: Oral Tube size: 7.5 mm Number of attempts: 1 Airway Equipment and Method: Rigid stylet Placement Confirmation: ETT inserted through vocal cords under direct vision,  positive ETCO2 and breath sounds checked- equal and bilateral Secured at: 22 cm Tube secured with: Tape Dental Injury: Teeth and Oropharynx as per pre-operative assessment  Difficulty Due To: Difficulty was anticipated and Difficult Airway- due to limited oral opening

## 2016-01-03 NOTE — Progress Notes (Signed)
ANTICOAGULATION CONSULT NOTE - Initial Consult  Pharmacy Consult for Heparin Indication: VTE treatment  Allergies  Allergen Reactions  . Sulfacetamide Sodium     edema    Patient Measurements: Height: 6\' 2"  (188 cm) Weight: 200 lb 9.9 oz (91 kg) IBW/kg (Calculated) : 82.2 Heparin Dosing Weight:  91 kg  Vital Signs: Temp: 97.9 F (36.6 C) (06/15 1951) Temp Source: Oral (06/15 1657) BP: 152/76 mmHg (06/15 2040) Pulse Rate: 97 (06/15 2040)  Labs:  Recent Labs  01/03/16 1142 01/03/16 1149  HGB 16.6 18.4*  HCT 46.7 54.0*  PLT 197  --   CREATININE  --  5.40*  CKTOTAL >50000*  --     Estimated Creatinine Clearance: 18.2 mL/min (by C-G formula based on Cr of 5.4).   Medical History: Past Medical History  Diagnosis Date  . Chest pain   . DDD (degenerative disc disease), cervical   . Back pain   . HTN (hypertension)   . Depression     Medications:  Prescriptions prior to admission  Medication Sig Dispense Refill Last Dose  . ALPRAZolam (XANAX) 0.5 MG tablet Take 0.5 mg by mouth at bedtime as needed for sleep.   Past Week at Unknown time  . amitriptyline (ELAVIL) 50 MG tablet Take 50 mg by mouth at bedtime.   Past Week at Unknown time  . amLODipine (NORVASC) 10 MG tablet Take 10 mg by mouth daily.   Past Week at Unknown time  . cetirizine (ZYRTEC) 10 MG tablet Take 10 mg by mouth daily.   Past Week at Unknown time  . citalopram (CELEXA) 40 MG tablet Take 40 mg by mouth daily.   Past Week at Unknown time  . clonazePAM (KLONOPIN) 0.5 MG tablet Take 0.5 mg by mouth 2 (two) times daily as needed for anxiety.   Past Week at Unknown time  . cyclobenzaprine (FLEXERIL) 10 MG tablet Take 10 mg by mouth every 8 (eight) hours as needed. Muscle spasms  0 Past Week at Unknown time  . hydrochlorothiazide (HYDRODIURIL) 25 MG tablet Take 25 mg by mouth daily.   Past Week at Unknown time  . montelukast (SINGULAIR) 10 MG tablet Take 10 mg by mouth daily as needed. allergies  11 Past  Week at Unknown time  . oxyCODONE (ROXICODONE) 15 MG immediate release tablet Take 15 mg by mouth every 8 (eight) hours as needed. pain  0 Past Week at Unknown time  . pantoprazole (PROTONIX) 40 MG tablet Take 40 mg by mouth daily.  11 Past Week at Unknown time  . risperiDONE (RISPERDAL) 1 MG tablet Take 1 mg by mouth at bedtime.  5 Past Week at Unknown time    Assessment: B/L lower limb pain for 2 days after he sustained a fall. Possibly syncope. Pain too severe to walk for last 48h. Reports dark colored urine. Patient is found to have Rhabdo, AKI, hyponatremia, hyperkalemia, +DVT. Ortho was consulted for BLE compartment syndrome. 6/15 he was taken emergently to OR for bilateral lower leg Four-compartment fasciotomy bilateral lower leg.  Labs: CK>50,000, LA 2.72, Na 122, K=5.5, Scr 5.4, WBC 20, H/H 16.6/46.7, Plts 197 UA with glucose, elevated protein, and Hgb  Goal of Therapy:  Heparin level 0.3-0.7 units/ml Monitor platelets by anticoagulation protocol: Yes   Plan:  No Xarelto given today--d/c'd Start IV heparin (no bolus) at 1650 units/hr Heparin level in 8 hrs Daily HL and CBC   Kimble Hitchens S. Merilynn Finlandobertson, PharmD, BCPS Clinical Staff Pharmacist Pager [phone]  Pasty Spillersobertson, Makailee Nudelman Stillinger  01/03/2016,9:11 PM

## 2016-01-03 NOTE — Discharge Summary (Signed)
Family Medicine Teaching [facility] Discharge Summary  Patient name: Jeffery Gross Medical record number: [id number] Date of birth: Oct 02, 1960 Age: 55 y.o. Gender: male Date of Admission: 01/03/2016  Date of Discharge: 01/23/2016 Admitting Physician: Doreene Eland, MD  Primary Care Provider: Remus Loffler, PA Consultants: Nephrology, Orthopedic Surgery   Indication for Hospitalization: Rhabdomyolysis    Discharge Diagnoses/Problem List:  Rhabdomyolysis  Compartment Syndrome Renal Insufficiency Anemia HTN  Chronic back pain  Disposition: Stable  Discharge Condition: Stable  Discharge Exam:  Blood pressure 106/65, pulse 85, temperature 97.9 F (36.6 C), temperature source Oral, resp. rate 17, height 6\' 1"  (1.854 m), weight 164 lb 3.9 oz (74.5 kg), SpO2 98 %.  General: No acute distress Eyes: Nl EOM, PEERLA,  ENTM: MMM Neck: supple, nl ROM Cardiovascular: RRR, no murmurs Respiratory: Clear bilaterally, no wheezes, no rales, no rhonchi Abdomen: Soft, non-tender MSK: bilateral above the knee amputations, dressings in place.  Skin: Warm and dry Neuro: Alert and oriented x4. CN 2-12 intact. Nl ROM of bilateral UE. Sensation intact throughout bilateral UE and bilateral LE.  Psych: Mood depressed and affect congruent with mood.   Brief Hospital Course:  Jeffery Gross is a 54-y.o. male with PMH of DDD, chronic back pain (s/p spinal fusion at L4-5), HTN, Asthma, and Depression who presented with B/L lower limb pain after he sustained a fall with presumed syncope. He was unable to walk and stayed in bed for 3 days until seeking evaluation. In the ED pt was found to have rhabdomyolysis and acute renal failure. Fluid resuscitation was started, and patient was found to have a right lower extremity DVT on lower extremity U/S. Imaging -- including CT abdomen pelvis, CT head, CXR, and pelvic xray -- was negative for acute abnormalities. Pt was admitted and started on Xarelto for DVT,  IVFs were continued, and further work-up of syncope was initiated with carotid u/s, echocardiogram and TSH -- all of which resulted with noncontributory findings. Nephrology was consulted due to patient's CK being greater than 50,0000 and creatinine being elevated. Initially they recommended IVFs. Orthopedic Surgery was consulted for evaluation of lower extremity compartment syndrome and recommended emergent bilateral lower extremity fasciotomies. Hospital course progressed as follows below:  Syncope: Likely orthostatic vs vasovagal. One time incident without any further episodes of syncope, lightheadedness or dizziness. CT head negative for any acute abnormalities. Echo performed showing EF 55-60% mild concentric hypertrophy. EKG was normal sinus. Carotid doppler U/S stenosis of 1-39% in right and left internal carotids. BP remained stable. MRI not possible due to pt having hardware in place from previous back surgery (discussed with Radiology).   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 Pt was taken to OR for urgent bilateral lower leg fasciotomies 01/03/16. He then had serial irrigation/debridements on 01/05/16 and 01/07/16. Due to degree of necrotic tissue in RLE, Orthopedic Surgery recommended right AKA with consideration for left AKA. During surgery, both lower limbs were deemed to be unsalvageable, so the left lower extremity was amputated in addition the right. Pt underwent bilateral AKAs 01/09/16 without complication. Wound vacuums were placed and subsequently changed to dry dressings. Pain was initially managed with IV fentanyl and dilaudid but weaned both to PO regimen of gabapentin and oxycodone. Pt was discharged to CIR for continued therapy.  Acute Renal Failure 2/2 Rhabdomyolysis: Related to being down after fall for extended period of time. Pt was given IVFs, and Nephrology  was consulted.  Initially CK could not be measured but on Day 2 of hospitalization, sample was able to be diluted enough to measure level at 420,003 U/L. CK trended downward over the course of the hospital stay. Nephrology recommended hemodialysis due to patient being oliguric in the setting of persistent IVF administration and having hyperphosphatemia. Creatinine was trended over the course of his stay and decreased. SCr was as high as 9.98 on Day 4 of hospitalization, which subsequently trended down. Lasix was started and Pt regained his ability to urinate, but continued to require HD for elevated creatinine. Pt's temporary HD catheter was changed to a tunneled catheter on 6/30. His renal function gradually improved and he was no longer requiring dialysis at the time of discharge to CIR.   Constipation: During the course of pt's hospital stay he did not have a bowel movement for the first 8 days of hospitalization. Likely related to patient's opioid drug medications. Pt was given senna and Miralax for treatment. Constipation resolved.  DVT: Started on Xarelto then transitioned to heparin, per pharmacy recommendations. Pt continued on heparin until surgeries planned. Post-op, pt was started on ASA per surgery's recommendation. No need to continue heparin due to DVT burden significantly decreased post amputation and risks outweigh the benefits (discussed with Vascular Surgery 6/23).  Normocytic Anemia: In setting of surgical blood loss and acute renal failure. Pt received a total of 8 units pRBCs during the course of his hospitalization. Work-up of his anemia included: FOBT negative, PT/INR 15.3/1.19, Iron 8 (L), TIBC 245 (L), Ferritin 209 (nl), LDH 384 (H), indirect bilirubin normal, reticulocytes 2.4%. Peripheral smear showing normocytic anemia. Pt did not notice bleeding from anywhere.   Scrotal swelling: Likely related to volume overload with aggressive IVFs for rhabdomyolysis. He did not have any pain associated with  the swelling. IVF were discontinued and pt was given dose of lasix in addition to intermittent dialysis to remove fluid. Urology was consulted and recommended elevating the scrotum above the level of the pelvis. This improved significantly by the time of discharge.   HTN: Pt's BP remained stable throughout the admission while holding at home HTN medications. Continued to hold medications due to BP remaining stable without them and follow up outpatient for need to continue medications.   Issues for Follow Up:  1. Discharged to CIR for continued rehab s/p bilateral AKA 2. Recommend rechecking H/H as an outpatient to ensure that his anemia is continuing to improve. 3. Will need removal of IJ temporary dialysis catheter after verification of consistent renal recovery prior to discharge home.  4. F/u HTN - HCTZ stopped here due to AKI. BP remained in normal range after discontinuing this medication  Significant Procedures: Multiple rounds of HD; IR converted temp HD cath to tunneled cath on 6/30. Fasciotomy on 6/17, bilateral AKA on 6/21  Significant Labs and Imaging:   Recent Labs Lab 01/20/16 0627 01/21/16 0323 01/22/16 0429  WBC 7.6 8.3 9.0  HGB 10.1* 9.4* 10.2*  HCT 32.2* 29.7* 32.0*  PLT 326 319 311    Recent Labs Lab 01/18/16 0530 01/19/16 0527 01/20/16 0627 01/21/16 0323 01/22/16 0409 01/23/16 0552  NA 137 139 139 141 138 139  K 4.0 3.9 4.1 3.7 3.9 3.9  CL 94* 94* 98* 92* 98* 99*  CO2 GLUCOSE 97 106* 101* 113* 104* 103*  BUN 55* 62* 37* 55* 63* 68*  CREATININE 7.09* 6.63* 3.80* 4.13* 3.84* 3.52*  CALCIUM 8.5* 8.9 8.9  9.9 9.3 9.5  PHOS 8.0*  --  5.5*  --  6.3* 5.7*  ALBUMIN 1.9*  --  2.1*  --  2.7* 2.7*    Results/Tests Pending at Time of Discharge: None  Discharge Medications:    Medication List    STOP taking these medications        clonazePAM 0.5 MG tablet  Commonly known as:  KLONOPIN     hydrochlorothiazide 25 MG tablet  Commonly  known as:  HYDRODIURIL      TAKE these medications        ALPRAZolam 0.5 MG tablet  Commonly known as:  XANAX  Take 0.5 mg by mouth at bedtime as needed for sleep.     amitriptyline 50 MG tablet  Commonly known as:  ELAVIL  Take 50 mg by mouth at bedtime.     amLODipine 10 MG tablet  Commonly known as:  NORVASC  Take 10 mg by mouth daily.     cetirizine 10 MG tablet  Commonly known as:  ZYRTEC  Take 10 mg by mouth daily.     citalopram 40 MG tablet  Commonly known as:  CELEXA  Take 40 mg by mouth daily.     cyclobenzaprine 10 MG tablet  Commonly known as:  FLEXERIL  Take 10 mg by mouth every 8 (eight) hours as needed. Muscle spasms     montelukast 10 MG tablet  Commonly known as:  SINGULAIR  Take 10 mg by mouth daily as needed. allergies     Oxycodone HCl 10 MG Tabs  Take 1 tablet (10 mg total) by mouth every 8 (eight) hours as needed for moderate pain.     pantoprazole 40 MG tablet  Commonly known as:  PROTONIX  Take 40 mg by mouth daily.     pregabalin 50 MG capsule  Commonly known as:  LYRICA  Take 1 capsule (50 mg total) by mouth daily.     risperiDONE 1 MG tablet  Commonly known as:  RISPERDAL  Take 1 mg by mouth at bedtime.        Discharge Instructions: Please refer to Patient Instructions section of EMR for full details.  Patient was counseled important signs and symptoms that should prompt return to medical care, changes in medications, dietary instructions, activity restrictions, and follow up appointments.   Follow-Up Appointments: Follow-up Information    Follow up with DUDA,MARCUS V, MD In 1 week.   Specialty:  Orthopedic Surgery   Contact information:   [address] [place] [address] [phone]      Prepared by: Willadean CarolKaty Mayo, MD  Signed, Ardith Darkaleb M Miri Jose, MD 01/23/2016, 12:33 PM [facility]

## 2016-01-03 NOTE — Consult Note (Signed)
ORTHOPAEDIC CONSULTATION  REQUESTING PHYSICIAN: Doreene Eland, MD  Chief Complaint: BLE compartment syndrome  HPI: Jeffery Gross is a 55 y.o. male who presents with BLE compartment syndrome.  Patient has been down on the floor at home for 2 days and wasn't able to get up.  Briefly lost consciousness.  Pain is extreme in BLE with paresthesias in bilateral feet.  Denies headache.  Pain is severe with any movement of foot.  Past Medical History  Diagnosis Date  . Chest pain   . DDD (degenerative disc disease), cervical   . Back pain   . HTN (hypertension)   . Depression    Past Surgical History  Procedure Laterality Date  . Vasectomy     Social History   Social History  . Marital Status: Divorced    Spouse Name: N/A  . Number of Children: N/A  . Years of Education: N/A   Social History Main Topics  . Smoking status: Current Some Day Smoker    Types: Cigars  . Smokeless tobacco: None  . Alcohol Use: 0.0 oz/week    0 Standard drinks or equivalent per week     Comment: 12 pack a month  . Drug Use: None  . Sexual Activity: Not Asked   Other Topics Concern  . None   Social History Narrative   Family History  Problem Relation Age of Onset  . Hypertension Father    - negative except otherwise stated in the family history section Allergies  Allergen Reactions  . Sulfacetamide Sodium     edema   Prior to Admission medications   Medication Sig Start Date End Date Taking? Authorizing Provider  ALPRAZolam Prudy Feeler) 0.5 MG tablet Take 0.5 mg by mouth at bedtime as needed for sleep.   Yes Historical Provider, MD  amitriptyline (ELAVIL) 50 MG tablet Take 50 mg by mouth at bedtime.   Yes Historical Provider, MD  amLODipine (NORVASC) 10 MG tablet Take 10 mg by mouth daily.   Yes Historical Provider, MD  cetirizine (ZYRTEC) 10 MG tablet Take 10 mg by mouth daily.   Yes Historical Provider, MD  citalopram (CELEXA) 40 MG tablet Take 40 mg by mouth daily.   Yes Historical  Provider, MD  clonazePAM (KLONOPIN) 0.5 MG tablet Take 0.5 mg by mouth 2 (two) times daily as needed for anxiety.   Yes Historical Provider, MD  cyclobenzaprine (FLEXERIL) 10 MG tablet Take 10 mg by mouth every 8 (eight) hours as needed. Muscle spasms 11/17/15  Yes Historical Provider, MD  hydrochlorothiazide (HYDRODIURIL) 25 MG tablet Take 25 mg by mouth daily.   Yes Historical Provider, MD  montelukast (SINGULAIR) 10 MG tablet Take 10 mg by mouth daily as needed. allergies 12/25/15  Yes Historical Provider, MD  oxyCODONE (ROXICODONE) 15 MG immediate release tablet Take 15 mg by mouth every 8 (eight) hours as needed. pain 12/24/15  Yes Historical Provider, MD  pantoprazole (PROTONIX) 40 MG tablet Take 40 mg by mouth daily. 10/26/15  Yes Historical Provider, MD  risperiDONE (RISPERDAL) 1 MG tablet Take 1 mg by mouth at bedtime. 10/26/15  Yes Historical Provider, MD   Ct Abdomen Pelvis Wo Contrast  01/03/2016  CLINICAL DATA:  Bilateral hip and lower back pain after fall down stairs 2 days ago. EXAM: CT ABDOMEN AND PELVIS WITHOUT CONTRAST TECHNIQUE: Multidetector CT imaging of the abdomen and pelvis was performed following the standard protocol without IV contrast. COMPARISON:  CT scan of June 09, 2005. FINDINGS: Status post surgical posterior  fusion of L5-S1. Visualized lung bases are unremarkable. Dilated gallbladder is noted without cholelithiasis or evidence of cholecystitis. No focal abnormality is noted in the liver, spleen or pancreas on these unenhanced images. Adrenal glands and kidneys appear normal. No hydronephrosis or renal obstruction is noted. No renal or ureteral calculi are noted. The appendix appears normal. There is no evidence of bowel obstruction. No abnormal fluid collection is noted. Urinary bladder appears normal. No significant adenopathy is noted. IMPRESSION: No significant abnormality seen in the abdomen or pelvis. Electronically Signed   By: Lupita Raider, M.D.   On: 01/03/2016 12:50    Dg Chest 2 View  01/03/2016  CLINICAL DATA:  Injury with fall yesterday, bilateral leg pain, some shortness of breath. EXAM: CHEST  2 VIEW COMPARISON:  Chest x-ray dated 05/08/2014 FINDINGS: Heart size is normal. Mild scarring at the left lung base. Lungs otherwise clear. No pleural effusion or pneumothorax seen. Right costophrenic angle excluded on the AP view. Mild degenerative spurring and mild chronic-appearing compression deformities within the mid and lower thoracic spine. No acute-appearing osseous abnormality. IMPRESSION: 1. No acute findings. 2. Chronic/incidental findings detailed above. Electronically Signed   By: Bary Richard M.D.   On: 01/03/2016 11:33   Dg Pelvis 1-2 Views  01/03/2016  CLINICAL DATA:  Bilateral leg pain status post fall. EXAM: PELVIS - 1-2 VIEW COMPARISON:  None. FINDINGS: There is no evidence of pelvic fracture or diastasis. No pelvic bone lesions are seen. L4-L5 spinal fusion is noted without evidence of hardware breakage. IMPRESSION: No acute fracture or dislocation identified about the pelvis. Electronically Signed   By: Ted Mcalpine M.D.   On: 01/03/2016 11:33   Ct Head Wo Contrast  01/03/2016  CLINICAL DATA:  Status post fall with bruising of the right face and difficulty opening patient's mouth. EXAM: CT HEAD WITHOUT CONTRAST CT MAXILLOFACIAL WITHOUT CONTRAST TECHNIQUE: Multidetector CT imaging of the head and maxillofacial structures were performed using the standard protocol without intravenous contrast. Multiplanar CT image reconstructions of the maxillofacial structures were also generated. COMPARISON:  CT of the head 08/04/2013 FINDINGS: CT HEAD FINDINGS No mass effect or midline shift. No evidence of acute intracranial hemorrhage, or infarction. No abnormal extra-axial fluid collections. Gray-white matter differentiation is normal. Basal cisterns are preserved. No depressed skull fractures. CT MAXILLOFACIAL FINDINGS The globes and extraocular muscles  appear symmetrical. No air fluid levels in the paranasal sinuses. The frontal bones, orbital rims, maxillary antral walls, nasal bones, nasal septum, nasal spine, maxilla, pterygoid plates, zygomatic arches, temporomandibular joints, and mandibles appear intact. Prior plate and screw fixation of the anterior left maxillary wall and left orbit is noted. There is mild residual depression of the anterior left maxillary wall. No displaced fractures are identified. Visualized thyroid cartilage and hyoid bone appear intact. IMPRESSION: No acute intracranial abnormality. No evidence of acute facial fractures. Prior plate and screw fixation of the left anterior maxillary wall and left orbit, with mild residual depression of the left anterior maxillary wall. Electronically Signed   By: Ted Mcalpine M.D.   On: 01/03/2016 12:45   Ct Maxillofacial Wo Cm  01/03/2016  CLINICAL DATA:  Status post fall with bruising of the right face and difficulty opening patient's mouth. EXAM: CT HEAD WITHOUT CONTRAST CT MAXILLOFACIAL WITHOUT CONTRAST TECHNIQUE: Multidetector CT imaging of the head and maxillofacial structures were performed using the standard protocol without intravenous contrast. Multiplanar CT image reconstructions of the maxillofacial structures were also generated. COMPARISON:  CT of the head 08/04/2013  FINDINGS: CT HEAD FINDINGS No mass effect or midline shift. No evidence of acute intracranial hemorrhage, or infarction. No abnormal extra-axial fluid collections. Gray-white matter differentiation is normal. Basal cisterns are preserved. No depressed skull fractures. CT MAXILLOFACIAL FINDINGS The globes and extraocular muscles appear symmetrical. No air fluid levels in the paranasal sinuses. The frontal bones, orbital rims, maxillary antral walls, nasal bones, nasal septum, nasal spine, maxilla, pterygoid plates, zygomatic arches, temporomandibular joints, and mandibles appear intact. Prior plate and screw fixation  of the anterior left maxillary wall and left orbit is noted. There is mild residual depression of the anterior left maxillary wall. No displaced fractures are identified. Visualized thyroid cartilage and hyoid bone appear intact. IMPRESSION: No acute intracranial abnormality. No evidence of acute facial fractures. Prior plate and screw fixation of the left anterior maxillary wall and left orbit, with mild residual depression of the left anterior maxillary wall. Electronically Signed   By: Ted Mcalpineobrinka  Dimitrova M.D.   On: 01/03/2016 12:45   - pertinent xrays, CT, MRI studies were reviewed and independently interpreted  Positive ROS: All other systems have been reviewed and were otherwise negative with the exception of those mentioned in the HPI and as above.  Physical Exam: General: Alert, no acute distress Cardiovascular: No pedal edema Respiratory: No cyanosis, no use of accessory musculature GI: No organomegaly, abdomen is soft and non-tender Skin: No lesions in the area of chief complaint Neurologic: Sensation intact distally Psychiatric: Patient is competent for consent with normal mood and affect Lymphatic: No axillary or cervical lymphadenopathy  MUSCULOSKELETAL:  - tight BLE muscular compartments with significant pain with passive movement of ankle and toes - 2+ pulses - foot wwp  Assessment: BLE compartment syndrome from rhabdo  Plan: - to OR emergently for fasciotomies - informed consent obtained  Thank you for the consult and the opportunity to see Jeffery Gross  N. Glee ArvinMichael Xu, MD [facility] [phone] 4:54 PM

## 2016-01-03 NOTE — ED Notes (Signed)
Attempted report 

## 2016-01-03 NOTE — ED Notes (Signed)
Pt fell down porch stairs Tues.  Does not recall fall.  Had friend call [facility] b/c he couldn't get out of bed.  EMS states pt ao x 4.  Urinating "what looks like runny diarrhea".  No pedal pulses per [facility] and feet are cold, but above ankles is hot. Bil hip pain and lower back pain.  Bruise to R face and difficulty opening mouth. VS stable per ems 150/90, rr 20, 99%, 99hr.

## 2016-01-03 NOTE — ED Notes (Signed)
2 L NS started while pt was in CT pt to be transported to Vascular after CT complete

## 2016-01-03 NOTE — Transfer of Care (Signed)
Immediate Anesthesia Transfer of Care Note  Patient: Jeffery Gross  Procedure(s) Performed: Procedure(s): FASCIOTOMY (Bilateral)  Patient Location: PACU  Anesthesia Type:General  Level of Consciousness: awake, alert  and oriented  Airway & Oxygen Therapy: Patient Spontanous Breathing  Post-op Assessment: Report given to RN and Post -op Vital signs reviewed and stable  Post vital signs: Reviewed and stable  Last Vitals:  Filed Vitals:   01/03/16 1545 01/03/16 1657  BP: 151/82 156/97  Pulse: 94 99  Temp:  36.8 C  Resp: 20 18    Last Pain:  Filed Vitals:   01/03/16 1905  PainSc: 8          Complications: No apparent anesthesia complications

## 2016-01-03 NOTE — Progress Notes (Signed)
VASCULAR LAB PRELIMINARY  PRELIMINARY  PRELIMINARY  PRELIMINARY  Bilateral lower extremity venous duplex completed.    Preliminary report:  There is acute DVT noted in the right popliteal, PT, and Peroneal veins.  All other veins appear thrombus free.   Called report to Roxy Horsemanobert Browning, PA-C  Everett Ehrler, RVT 01/03/2016, 1:03 PM

## 2016-01-03 NOTE — ED Provider Notes (Signed)
CSN: [id number]     Arrival date & time 01/03/16  1035 History   First MD Initiated Contact with Patient 01/03/16 1038     Chief Complaint  Patient presents with  . Fall     (Consider location/radiation/quality/duration/timing/severity/associated sxs/prior Treatment) HPI Comments: Patient presents emergency department with chief complaint of bilateral lower extremity pain. Patient reportedly fell down his porch stairs on Tuesday. Patient does not remember falling. He states that he has been unable to ambulate since because of weakness and pain in his lower extremities. Reports that he has been having dark urine.  Additionally, patient complains of pain in his jaw. He states that it hurts when he opens his mouth. He has not taken for his symptoms. There is no other associated symptoms.  The history is provided by the patient. No language interpreter was used.    Past Medical History  Diagnosis Date  . Chest pain   . DDD (degenerative disc disease), cervical   . Back pain   . HTN (hypertension)   . Depression    Past Surgical History  Procedure Laterality Date  . Vasectomy     No family history on file. Social History  Substance Use Topics  . Smoking status: Current Some Day Smoker  . Smokeless tobacco: None  . Alcohol Use: No    Review of Systems  All other systems reviewed and are negative.     Allergies  Sulfacetamide sodium  Home Medications   Prior to Admission medications   Medication Sig Start Date End Date Taking? Authorizing Provider  ALPRAZolam Prudy Feeler(XANAX) 0.5 MG tablet Take 0.5 mg by mouth at bedtime as needed for sleep.    Historical Provider, MD  amitriptyline (ELAVIL) 50 MG tablet Take 50 mg by mouth at bedtime.    Historical Provider, MD  amLODipine (NORVASC) 10 MG tablet Take 10 mg by mouth daily.    Historical Provider, MD  cetirizine (ZYRTEC) 10 MG tablet Take 10 mg by mouth daily.    Historical Provider, MD  citalopram (CELEXA) 40 MG tablet Take 40 mg  by mouth daily.    Historical Provider, MD  clonazePAM (KLONOPIN) 0.5 MG tablet Take 0.5 mg by mouth 2 (two) times daily as needed for anxiety.    Historical Provider, MD  doxycycline (VIBRAMYCIN) 100 MG capsule Take 100 mg by mouth 2 (two) times daily. For 10 days    Historical Provider, MD  hydrochlorothiazide (HYDRODIURIL) 25 MG tablet Take 25 mg by mouth daily.    Historical Provider, MD  HYDROcodone-acetaminophen (NORCO) 10-325 MG per tablet Take 1 tablet by mouth every 6 (six) hours as needed for pain.    Historical Provider, MD   BP 145/91 mmHg  Pulse 85  Temp(Src) 97.8 F (36.6 C) (Oral)  Resp 14  SpO2 100% Physical Exam  Constitutional: He is oriented to person, place, and time. He appears well-developed and well-nourished.  HENT:  Head: Normocephalic and atraumatic.  Eyes: Conjunctivae and EOM are normal. Pupils are equal, round, and reactive to light. Right eye exhibits no discharge. Left eye exhibits no discharge. No scleral icterus.  Neck: Normal range of motion. Neck supple. No JVD present.  Cardiovascular: Normal rate, regular rhythm, normal heart sounds and intact distal pulses.  Exam reveals no gallop and no friction rub.   No murmur heard. Distal pulses are present with Doppler  Pulmonary/Chest: Effort normal and breath sounds normal. No respiratory distress. He has no wheezes. He has no rales. He exhibits no tenderness.  Abdominal:  Soft. He exhibits no distension and no mass. There is no tenderness. There is no rebound and no guarding.  Musculoskeletal: Normal range of motion. He exhibits no edema or tenderness.  Bilateral lower extremities are tender to palpation, no bony abnormality or deformity, range of motion and strength limited by pain  Neurological: He is alert and oriented to person, place, and time.  Sensation intact throughout  Skin: Skin is warm and dry.  Bilateral calves are swollen, mildly erythematous, and tender  Psychiatric: He has a normal mood and  affect. His behavior is normal. Judgment and thought content normal.  Nursing note and vitals reviewed.   ED Course  Procedures (including critical care time) Results for orders placed or performed during the hospital encounter of 01/03/16  CBC with Differential/Platelet  Result Value Ref Range   WBC 20.0 (H) 4.0 - 10.5 K/uL   RBC 5.92 (H) 4.22 - 5.81 MIL/uL   Hemoglobin 16.6 13.0 - 17.0 g/dL   HCT 16.1 09.6 - 04.5 %   MCV 78.9 78.0 - 100.0 fL   MCH 28.0 26.0 - 34.0 pg   MCHC 35.5 30.0 - 36.0 g/dL   RDW 40.9 81.1 - 91.4 %   Platelets 197 150 - 400 K/uL   Neutrophils Relative % 84 %   Neutro Abs 16.7 (H) 1.7 - 7.7 K/uL   Lymphocytes Relative 5 %   Lymphs Abs 1.0 0.7 - 4.0 K/uL   Monocytes Relative 11 %   Monocytes Absolute 2.2 (H) 0.1 - 1.0 K/uL   Eosinophils Relative 0 %   Eosinophils Absolute 0.0 0.0 - 0.7 K/uL   Basophils Relative 0 %   Basophils Absolute 0.0 0.0 - 0.1 K/uL  CK  Result Value Ref Range   Total CK >50000 (H) 49 - 397 U/L  I-stat chem 8, ed  Result Value Ref Range   Sodium 122 (L) 135 - 145 mmol/L   Potassium 5.5 (H) 3.5 - 5.1 mmol/L   Chloride 96 (L) 101 - 111 mmol/L   BUN 68 (H) 6 - 20 mg/dL   Creatinine, Ser 7.82 (H) 0.61 - 1.24 mg/dL   Glucose, Bld 956 (H) 65 - 99 mg/dL   Calcium, Ion 2.13 (L) 1.12 - 1.23 mmol/L   TCO2 20 0 - 100 mmol/L   Hemoglobin 18.4 (H) 13.0 - 17.0 g/dL   HCT 08.6 (H) 57.8 - 46.9 %   Comment NOTIFIED PHYSICIAN   I-stat troponin, ED  Result Value Ref Range   Troponin i, poc 0.00 0.00 - 0.08 ng/mL   Comment 3          I-Stat CG4 Lactic Acid, ED  Result Value Ref Range   Lactic Acid, Venous 2.72 (HH) 0.5 - 2.0 mmol/L   Comment NOTIFIED PHYSICIAN    Ct Abdomen Pelvis Wo Contrast  01/03/2016  CLINICAL DATA:  Bilateral hip and lower back pain after fall down stairs 2 days ago. EXAM: CT ABDOMEN AND PELVIS WITHOUT CONTRAST TECHNIQUE: Multidetector CT imaging of the abdomen and pelvis was performed following the standard protocol  without IV contrast. COMPARISON:  CT scan of June 09, 2005. FINDINGS: Status post surgical posterior fusion of L5-S1. Visualized lung bases are unremarkable. Dilated gallbladder is noted without cholelithiasis or evidence of cholecystitis. No focal abnormality is noted in the liver, spleen or pancreas on these unenhanced images. Adrenal glands and kidneys appear normal. No hydronephrosis or renal obstruction is noted. No renal or ureteral calculi are noted. The appendix appears normal. There is  no evidence of bowel obstruction. No abnormal fluid collection is noted. Urinary bladder appears normal. No significant adenopathy is noted. IMPRESSION: No significant abnormality seen in the abdomen or pelvis. Electronically Signed   By: Lupita Raider, M.D.   On: 01/03/2016 12:50   Dg Chest 2 View  01/03/2016  CLINICAL DATA:  Injury with fall yesterday, bilateral leg pain, some shortness of breath. EXAM: CHEST  2 VIEW COMPARISON:  Chest x-ray dated 05/08/2014 FINDINGS: Heart size is normal. Mild scarring at the left lung base. Lungs otherwise clear. No pleural effusion or pneumothorax seen. Right costophrenic angle excluded on the AP view. Mild degenerative spurring and mild chronic-appearing compression deformities within the mid and lower thoracic spine. No acute-appearing osseous abnormality. IMPRESSION: 1. No acute findings. 2. Chronic/incidental findings detailed above. Electronically Signed   By: Bary Richard M.D.   On: 01/03/2016 11:33   Dg Pelvis 1-2 Views  01/03/2016  CLINICAL DATA:  Bilateral leg pain status post fall. EXAM: PELVIS - 1-2 VIEW COMPARISON:  None. FINDINGS: There is no evidence of pelvic fracture or diastasis. No pelvic bone lesions are seen. L4-L5 spinal fusion is noted without evidence of hardware breakage. IMPRESSION: No acute fracture or dislocation identified about the pelvis. Electronically Signed   By: Ted Mcalpine M.D.   On: 01/03/2016 11:33   Ct Head Wo  Contrast  01/03/2016  CLINICAL DATA:  Status post fall with bruising of the right face and difficulty opening patient's mouth. EXAM: CT HEAD WITHOUT CONTRAST CT MAXILLOFACIAL WITHOUT CONTRAST TECHNIQUE: Multidetector CT imaging of the head and maxillofacial structures were performed using the standard protocol without intravenous contrast. Multiplanar CT image reconstructions of the maxillofacial structures were also generated. COMPARISON:  CT of the head 08/04/2013 FINDINGS: CT HEAD FINDINGS No mass effect or midline shift. No evidence of acute intracranial hemorrhage, or infarction. No abnormal extra-axial fluid collections. Gray-white matter differentiation is normal. Basal cisterns are preserved. No depressed skull fractures. CT MAXILLOFACIAL FINDINGS The globes and extraocular muscles appear symmetrical. No air fluid levels in the paranasal sinuses. The frontal bones, orbital rims, maxillary antral walls, nasal bones, nasal septum, nasal spine, maxilla, pterygoid plates, zygomatic arches, temporomandibular joints, and mandibles appear intact. Prior plate and screw fixation of the anterior left maxillary wall and left orbit is noted. There is mild residual depression of the anterior left maxillary wall. No displaced fractures are identified. Visualized thyroid cartilage and hyoid bone appear intact. IMPRESSION: No acute intracranial abnormality. No evidence of acute facial fractures. Prior plate and screw fixation of the left anterior maxillary wall and left orbit, with mild residual depression of the left anterior maxillary wall. Electronically Signed   By: Ted Mcalpine M.D.   On: 01/03/2016 12:45   Ct Maxillofacial Wo Cm  01/03/2016  CLINICAL DATA:  Status post fall with bruising of the right face and difficulty opening patient's mouth. EXAM: CT HEAD WITHOUT CONTRAST CT MAXILLOFACIAL WITHOUT CONTRAST TECHNIQUE: Multidetector CT imaging of the head and maxillofacial structures were performed using  the standard protocol without intravenous contrast. Multiplanar CT image reconstructions of the maxillofacial structures were also generated. COMPARISON:  CT of the head 08/04/2013 FINDINGS: CT HEAD FINDINGS No mass effect or midline shift. No evidence of acute intracranial hemorrhage, or infarction. No abnormal extra-axial fluid collections. Gray-white matter differentiation is normal. Basal cisterns are preserved. No depressed skull fractures. CT MAXILLOFACIAL FINDINGS The globes and extraocular muscles appear symmetrical. No air fluid levels in the paranasal sinuses. The frontal bones, orbital rims,  maxillary antral walls, nasal bones, nasal septum, nasal spine, maxilla, pterygoid plates, zygomatic arches, temporomandibular joints, and mandibles appear intact. Prior plate and screw fixation of the anterior left maxillary wall and left orbit is noted. There is mild residual depression of the anterior left maxillary wall. No displaced fractures are identified. Visualized thyroid cartilage and hyoid bone appear intact. IMPRESSION: No acute intracranial abnormality. No evidence of acute facial fractures. Prior plate and screw fixation of the left anterior maxillary wall and left orbit, with mild residual depression of the left anterior maxillary wall. Electronically Signed   By: Ted Mcalpine M.D.   On: 01/03/2016 12:45    I have personally reviewed and evaluated these images and lab results as part of my medical decision-making.   EKG Interpretation   Date/Time:  Thursday January 03 2016 11:29:30 EDT Ventricular Rate:  72 PR Interval:  153 QRS Duration: 99 QT Interval:  370 QTC Calculation: 405 R Axis:   -17 Text Interpretation:  Age not entered, assumed to be  55 years old for  purpose of ECG interpretation Sinus rhythm Borderline left axis deviation  No significant change since last tracing Confirmed by NGUYEN, EMILY  [phone]) on 01/03/2016 1:30:45 PM      MDM   Final diagnoses:   Traumatic rhabdomyolysis, initial encounter (HCC)  DVT (deep venous thrombosis), right  AKI (acute kidney injury) (HCC)   Patient with mechanical fall down 4 steps 4 days ago. Complains of bilateral lower extremity pain. Also complains of right jaw pain.  DP/DT pulses are not palpable, but are present with Doppler.    The patient has been in bed for the past 4 days. Has myalgias of the lower extremities, no evidence of bony abnormality or deformity. Concern for rhabdomyolysis, will check CK. We'll also check bilateral DVT studies. Will give fluids and reassess.  Patient seen by and discussed with Dr. Cyndie Chime.  Sodium is 122, potassium 5.5, no arrhythmias seen on EKG, no ischemic changes on EKG, creatinine is 5.4, calcium Ion 0.74, H&H appears to be hemoconcentrated at 18.4/54    Patient has received 2 L of fluid, will give an additional liter.   Notified that CK is >50k.  CT and plain films are as above. No focal findings. I suspect that the patient's lower extremity myalgias are secondary to rhabdomyolysis. Will consult internal medicine for admission.  Appreciate family medicine team for admitting the patient.  Notify the patient has right lower extremity DVT in peroneal, PT, and popliteal veins.  Discussed this with family medicine.  CRITICAL CARE Performed by: Roxy Horseman   Total critical care time: 40 minutes  Critical care time was exclusive of separately billable procedures and treating other patients.  Critical care was necessary to treat or prevent imminent or life-threatening deterioration.  Critical care was time spent personally by me on the following activities: development of treatment plan with patient and/or surrogate as well as nursing, discussions with consultants, evaluation of patient's response to treatment, examination of patient, obtaining history from patient or surrogate, ordering and performing treatments and interventions, ordering and review of  laboratory studies, ordering and review of radiographic studies, pulse oximetry and re-evaluation of patient's condition.     Roxy Horseman, PA-C 01/03/16 1405  Leta Baptist, MD 01/09/16 [phone]

## 2016-01-03 NOTE — Care Management Note (Signed)
Case Management Note  Patient Details  Name: Jeffery Gross MRN: [id number] Date of Birth: 11-02-1960  Subjective/Objective:                  55 y.o. male presenting with fall found to have rhabdomyolysis and right DVT. PMH is significant for chronic back pain, HTN, depression and anxiety. /From home.  Action/Plan: Follow for disposition needs.   Expected Discharge Date:                  Expected Discharge Plan:     In-House Referral:     Discharge planning Services     Post Acute Care Choice:    Choice offered to:     DME Arranged:    DME Agency:     HH Arranged:    HH Agency:     Status of Service:     Medicare Important Message Given:    Date Medicare IM Given:    Medicare IM give by:    Date Additional Medicare IM Given:    Additional Medicare Important Message give by:     If discussed at Long Length of Stay Meetings, dates discussed:    Additional Comments:  Oletta CohnWood, Matti Minney, RN 01/03/2016, 3:52 PM
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
## 2016-01-03 NOTE — Progress Notes (Addendum)
Paged MD to verify pt's fluid orders; NS at 26115ml/hr and simultaneous LR at 4210ml/hr. MD verified that NS should be at 23115ml/hr and for LR to be discontinued. Will continue to monitor.  At 1:30am, pt due to void. Pt bladder scanned. 55ml found. Will continue to monitor pt for swelling/edema and potential urine output. Pt is on continuous fluid infusion.  Will attempt another bladder scan again at 6:00am. MD notified. Advised to carry out the plan. MD also mentioned that nephrology may be consulted. Will continue to monitor.  At 3:15am, Infusion rate for NS changed to 14925ml/hr from 23115ml/hr. Will continue to monitor.  At 6:05am, pt was bladder scanned again, about 50ml was found. New Creatine value of 6.44. No new swelling was observed at this time, just the +1 edema, That had been present at initial assessment. MD notified. Will continue to monitor.

## 2016-01-04 ENCOUNTER — Inpatient Hospital Stay (HOSPITAL_COMMUNITY): Payer: BLUE CROSS/BLUE SHIELD

## 2016-01-04 ENCOUNTER — Encounter (HOSPITAL_COMMUNITY): Payer: Self-pay | Admitting: Orthopaedic Surgery

## 2016-01-04 DIAGNOSIS — R55 Syncope and collapse: Secondary | ICD-10-CM

## 2016-01-04 LAB — COMPREHENSIVE METABOLIC PANEL
ALT: 624 U/L — AB (ref 17–63)
AST: 1428 U/L — AB (ref 15–41)
Albumin: 2.1 g/dL — ABNORMAL LOW (ref 3.5–5.0)
Alkaline Phosphatase: 55 U/L (ref 38–126)
Anion gap: 15 (ref 5–15)
BUN: 76 mg/dL — AB (ref 6–20)
CHLORIDE: 95 mmol/L — AB (ref 101–111)
CO2: 15 mmol/L — AB (ref 22–32)
CREATININE: 6.44 mg/dL — AB (ref 0.61–1.24)
Calcium: 6.8 mg/dL — ABNORMAL LOW (ref 8.9–10.3)
GFR calc Af Amer: 10 mL/min — ABNORMAL LOW (ref >60)
GFR calc non Af Amer: 9 mL/min — ABNORMAL LOW (ref >60)
Glucose, Bld: 110 mg/dL — ABNORMAL HIGH (ref 65–99)
Potassium: 4.6 mmol/L (ref 3.5–5.1)
SODIUM: 125 mmol/L — AB (ref 135–145)
Total Bilirubin: 0.4 mg/dL (ref 0.3–1.2)
Total Protein: 4.8 g/dL — ABNORMAL LOW (ref 6.5–8.1)

## 2016-01-04 LAB — CK: Total CK: >50000 U/L — ABNORMAL HIGH (ref 49–397)

## 2016-01-04 LAB — ECHOCARDIOGRAM COMPLETE
EWDT: 206 ms
FS: 37 % (ref 28–44)
Height: 74 in
IV/PV OW: 1
LA ID, A-P, ES: 38 mm
LA diam end sys: 38 mm
LA diam index: 1.74 cm/m2
LAVOLA4C: 45.8 mL
LV TDI E'LATERAL: 13.5
LV e' LATERAL: 13.5 cm/s
LVOT area: 3.46 cm2
LVOTD: 21 mm
MV Dec: 206
MVPKEVEL: 1 m/s
PW: 13 mm — AB (ref 0.6–1.1)
RV TAPSE: 23.5 mm
TDI e' medial: 11.5
Weight: 3209.9 oz

## 2016-01-04 LAB — LACTIC ACID, PLASMA: LACTIC ACID, VENOUS: 1 mmol/L (ref 0.5–2.0)

## 2016-01-04 LAB — CBC
HCT: 36.2 % — ABNORMAL LOW (ref 39.0–52.0)
Hemoglobin: 12.2 g/dL — ABNORMAL LOW (ref 13.0–17.0)
MCH: 27.2 pg (ref 26.0–34.0)
MCHC: 33.7 g/dL (ref 30.0–36.0)
MCV: 80.8 fL (ref 78.0–100.0)
PLATELETS: 166 10*3/uL (ref 150–400)
RBC: 4.48 MIL/uL (ref 4.22–5.81)
RDW: 13.2 % (ref 11.5–15.5)
WBC: 13.1 10*3/uL — AB (ref 4.0–10.5)

## 2016-01-04 LAB — HEPARIN LEVEL (UNFRACTIONATED)
HEPARIN UNFRACTIONATED: 0.33 [IU]/mL (ref 0.30–0.70)
Heparin Unfractionated: <0.1 IU/mL — ABNORMAL LOW (ref 0.30–0.70)
Heparin Unfractionated: 0.15 IU/mL — ABNORMAL LOW (ref 0.30–0.70)

## 2016-01-04 LAB — TSH: TSH: 1.191 u[IU]/mL (ref 0.350–4.500)

## 2016-01-04 LAB — PROTIME-INR
INR: 1.2 (ref 0.00–1.49)
Prothrombin Time: 15.4 seconds — ABNORMAL HIGH (ref 11.6–15.2)

## 2016-01-04 MED ORDER — SODIUM BICARBONATE 8.4 % IV SOLN
INTRAVENOUS | Status: DC
  Administered 2016-01-04 – 2016-01-08 (×12): via INTRAVENOUS
  Filled 2016-01-04 (×22): qty 150

## 2016-01-04 MED ORDER — SODIUM CHLORIDE 0.9 % IV SOLN
INTRAVENOUS | Status: DC
  Administered 2016-01-04 – 2016-01-07 (×10): via INTRAVENOUS
  Administered 2016-01-08: 1000 mL via INTRAVENOUS

## 2016-01-04 MED ORDER — HYDROMORPHONE HCL 1 MG/ML IJ SOLN
1.0000 mg | INTRAMUSCULAR | Status: DC | PRN
  Administered 2016-01-04 – 2016-01-07 (×9): 1 mg via INTRAVENOUS
  Filled 2016-01-04 (×8): qty 1

## 2016-01-04 MED ORDER — ADULT MULTIVITAMIN W/MINERALS CH
1.0000 | ORAL_TABLET | Freq: Every day | ORAL | Status: DC
  Administered 2016-01-04 – 2016-01-08 (×4): 1 via ORAL
  Filled 2016-01-04 (×4): qty 1

## 2016-01-04 MED ORDER — ENSURE ENLIVE PO LIQD
237.0000 mL | Freq: Three times a day (TID) | ORAL | Status: DC
  Administered 2016-01-04 – 2016-01-08 (×4): 237 mL via ORAL

## 2016-01-04 MED ORDER — HEPARIN BOLUS VIA INFUSION
2000.0000 [IU] | Freq: Once | INTRAVENOUS | Status: DC
  Filled 2016-01-04: qty 2000

## 2016-01-04 MED ORDER — CEFAZOLIN SODIUM-DEXTROSE 2-4 GM/100ML-% IV SOLN
2.0000 g | INTRAVENOUS | Status: DC
  Filled 2016-01-04 (×2): qty 100

## 2016-01-04 NOTE — Progress Notes (Signed)
ANTICOAGULATION CONSULT NOTE - Follow Up Consult  Pharmacy Consult for heparin Indication: VTE   Labs:  Recent Labs  01/03/16 1142 01/03/16 1149 01/04/16 0244  HGB 16.6 18.4* 12.2*  HCT 46.7 54.0* 36.2*  PLT 197  --  166  HEPARINUNFRC  --   --  <0.10*  CREATININE  --  5.40*  --   CKTOTAL >50000*  --   --      Assessment/Plan:  Heparin level undetectable but gtt started late and lab drawn early so cannot interpret correctly; will continue for now and check another level prior to changing rate.  Jeffery Gross, PharmD, BCPS  01/04/2016,4:04 AM

## 2016-01-04 NOTE — Progress Notes (Signed)
   Subjective:  Patient reports pain as moderate.  Improved.  Objective:   VITALS:   Filed Vitals:   01/03/16 2002 01/03/16 2040 01/03/16 2144 01/04/16 0508  BP: 116/81 152/76  147/84  Pulse: 101 97  98  Temp:   98.6 F (37 C) 98.1 F (36.7 C)  TempSrc:   Oral Oral  Resp: 19 19  20   Height:      Weight:      SpO2: 99% 100%  95%    VAC with good seal and suction Compartments soft with improved swelling   Lab Results  Component Value Date   WBC 13.1* 01/04/2016   HGB 12.2* 01/04/2016   HCT 36.2* 01/04/2016   MCV 80.8 01/04/2016   PLT 166 01/04/2016     Assessment/Plan:  1 Day Post-Op   - back to OR Saturday for serial debridement and possible closure of fasciotomies - NPO after midnight  Cheral AlmasXu, Candace Ramus Michael 01/04/2016, 6:56 AM [phone]
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
## 2016-01-04 NOTE — Progress Notes (Signed)
FPTS Interim Progress Note  S: Evaluated patient follow surgery for compartment syndrome. Doing well at that time. Stated he wanted to get some sleep. Nurse with concern regarding output over the past 3 hours.   O: BP 152/76 mmHg  Pulse 97  Temp(Src) 98.6 F (37 C) (Oral)  Resp 19  Ht 6\' 2"  (1.88 m)  Wt 200 lb 9.9 oz (91 kg)  BMI 25.75 kg/m2  SpO2 100%  Lungs: CTAB, no signs of fluid overload  Wound vacs in place on bilaterally legs   A/P:  No UOP over the past 6 hours now, concern for anuric renal failure in the setting of severe rhabdomyolysis  (SCr 5.4). Bladder scan with only 55 ccs of fluid.  - Will watch closely, discussed with nursing any signs of volume overload, please call  - Urine bladder scan at 6 AM  - Will reduce fluids to 125 ml due to concern for fluid overload if anuric  - Nephrology to see patient in the AM    Elea Holtzclaw Mayra ReelZahra Tifanie Gardiner, MD 01/04/2016, 2:34 AM PGY-1[facility] Service pager [phone]

## 2016-01-04 NOTE — Consult Note (Addendum)
WOC wound consult note WOC consult requested for wounds. Ortho team is following for assessment and plan of care for Vac dressings to legs and plans to change in the OR on Sat, according to the EMR.  Please refer to their team for further questions. Left leg with medial and lateral vac dressings intact with good seal to 125mm cont suction. Right leg with medial and lateral vac dressings intact with good seal to 125mm cont suction.  Each leg with dressings Yed together to a separate machine, mod amt pink drainage in the canisters.  Pt denies any further wounds; sacrum/buttocks and bilat heels assessed and no pressure injuries present.   Please re-consult if further assistance is needed.  Thank-you,  Cammie Mcgeeawn Sheyla Zaffino MSN, RN, CWOCN, [place]AP, CNS [phone]

## 2016-01-04 NOTE — Progress Notes (Signed)
*  PRELIMINARY RESULTS* Echocardiogram 2D Echocardiogram has been performed.  Jeryl Columbialliott, Cohen Boettner 01/04/2016, 11:41 AM

## 2016-01-04 NOTE — Progress Notes (Signed)
OT Cancellation Note  Patient Details Name: Jeffery Gross MRN: [id number] DOB: [birth year]   Cancelled Treatment:    Reason Eval/Treat Not Completed: Fatigue/lethargy limiting ability to participate. Will attempt tomorrow.   [facility] LLC[facility],HILLARY  Mikael Skoda, OTR/L  [phone] 01/04/2016 01/04/2016, 3:39 PM

## 2016-01-04 NOTE — Progress Notes (Signed)
ANTICOAGULATION CONSULT NOTE - Follow Up Consult  Pharmacy Consult for heparin Indication: bilateral DVT's  Allergies  Allergen Reactions  . Sulfacetamide Sodium     edema    Patient Measurements: Height: 6\' 2"  (188 cm) Weight: 200 lb 9.9 oz (91 kg) IBW/kg (Calculated) : 82.2 Heparin Dosing Weight: 91 kg  Vital Signs: BP: 150/79 mmHg (06/16 0930)  Labs:  Recent Labs  01/03/16 1142 01/03/16 1149 01/04/16 0244 01/04/16 0746 01/04/16 1201 01/04/16 1633  HGB 16.6 18.4* 12.2*  --   --   --   HCT 46.7 54.0* 36.2*  --   --   --   PLT 197  --  166  --   --   --   LABPROT  --   --   --   --  15.4*  --   INR  --   --   --   --  1.20  --   HEPARINUNFRC  --   --  <0.10* 0.15*  --  0.33  CREATININE  --  5.40* 6.44*  --   --   --   CKTOTAL >50000*  --  >50000*  --   --   --     Estimated Creatinine Clearance: 15.2 mL/min (by C-G formula based on Cr of 6.44).   Medications:  Infusions:  . sodium chloride 100 mL/hr at 01/04/16 0913  . heparin 1,950 Units/hr (01/04/16 1703)  .  sodium bicarbonate  infusion 1000 mL 100 mL/hr at 01/04/16 [id number]    Assessment: 55 y/o male with B/L lower limb pain for 2 days after he sustained a fall. Pain too severe to walk for last 48h. Reports dark colored urine. Patient is found to have Rhabdo, AKI, hyponatremia, hyperkalemia, +DVT. Ortho was consulted for BLE compartment syndrome. 6/15 he was taken emergently to OR for bilateral lower leg four-compartment fasciotomy. Plan is back to OR Sat for serial debridement and possible closure of fasciotomies.  PM heparin level therapeutic  Goal of Therapy:  Heparin level 0.3-0.7 units/ml Monitor platelets by anticoagulation protocol: Yes   Plan:  - Continue heparin drip at 1950 units/hr - Daily HL and CBC - Monitor for s/sx of bleeding - F/U after surgery tomorrow - F/U plan for oral anticoag therapy  Thank you Okey RegalLisa Anamika Kueker, PharmD [phone] 01/04/2016 5:13 PM

## 2016-01-04 NOTE — Progress Notes (Signed)
Initial Nutrition Assessment  DOCUMENTATION CODES:   Not applicable  INTERVENTION:   -Ensure Enlive po TID, each supplement provides 350 kcal and 20 grams of protein -MVI daily  NUTRITION DIAGNOSIS:   Increased nutrient needs related to wound healing as evidenced by estimated needs.  GOAL:   Patient will meet greater than or equal to 90% of their needs  MONITOR:   PO intake, Supplement acceptance, Labs, Weight trends, Skin, I & O's  REASON FOR ASSESSMENT:   Malnutrition Screening Tool    ASSESSMENT:   Jeffery Gross is a 55 y.o. male presenting with fall found to have rhabdomyolysis and right DVT. PMH is significant for chronic back pain, HTN, depression and anxiety.   Pt admitted with rhabdomyolysis, DVT, and bilateral lower leg compartment syndrome.   S/p Procedure 6/15:  1. Bilateral lower leg Four-compartment fasciotomy bilateral lower leg 2. Application of wound vac >50 sq cm  Spoke with pt at bedside. He reports poor appetite over the past week secondary to leg pain. Due to pain, pt was minimally conversant but unable to provide further details for diet recall. Meal completion 50% at breakfast. Pt reports he feels more comfortable drinking liquids at this time, noted multiple gingerale cans at bedside.   Pt reports he suspects he may have lost a few pounds, but denies any significant weight loss. Noted UBW of 230#, however, weight loss appears to be distant per wt hx.   Pt with wound vac to bilateral legs.Pt understanding regarding the importance of good PO intake to promote healing. Pt very receptive to RD recommendations and is amenable to Ensure.   Nutrition-Focused physical exam completed. Findings are no fat depletion, no muscle depletion, and mild edema.   Labs reviewed.   Diet Order:  Diet Heart Room service appropriate?: Yes; Fluid consistency:: Thin  Skin:  Wound (see comment) (wound vac bilateral legs)  Last BM:  PTA  Height:   Ht Readings  from Last 1 Encounters:  01/03/16 6\' 2"  (1.88 m)    Weight:   Wt Readings from Last 1 Encounters:  01/03/16 200 lb 9.9 oz (91 kg)    Ideal Body Weight:  86.4 kg  BMI:  Body mass index is 25.75 kg/(m^2).  Estimated Nutritional Needs:   Kcal:  2200-2400  Protein:  120-135 grams  Fluid:  2.2-2.4 L  EDUCATION NEEDS:   Education needs addressed  Keaja Reaume A. Mayford KnifeWilliams, RD, LDN, CDE Pager: [phone] After hours Pager: [phone]

## 2016-01-04 NOTE — Progress Notes (Addendum)
ANTICOAGULATION CONSULT NOTE - Follow Up Consult  Pharmacy Consult for heparin Indication: bilateral DVT's  Allergies  Allergen Reactions  . Sulfacetamide Sodium     edema    Patient Measurements: Height: 6\' 2"  (188 cm) Weight: 200 lb 9.9 oz (91 kg) IBW/kg (Calculated) : 82.2 Heparin Dosing Weight: 91 kg  Vital Signs: Temp: 98.1 F (36.7 C) (06/16 0508) Temp Source: Oral (06/16 0508) BP: 147/84 mmHg (06/16 0508) Pulse Rate: 98 (06/16 0508)  Labs:  Recent Labs  01/03/16 1142 01/03/16 1149 01/04/16 0244 01/04/16 0746  HGB 16.6 18.4* 12.2*  --   HCT 46.7 54.0* 36.2*  --   PLT 197  --  166  --   HEPARINUNFRC  --   --  <0.10* 0.15*  CREATININE  --  5.40* 6.44*  --   CKTOTAL >50000*  --  >50000*  --     Estimated Creatinine Clearance: 15.2 mL/min (by C-G formula based on Cr of 6.44).   Medications:  Infusions:  . sodium chloride 75 mL/hr at 01/04/16 0712  . heparin 1,650 Units/hr (01/03/16 2301)  .  sodium bicarbonate  infusion 1000 mL      Assessment: 55 y/o male with B/L lower limb pain for 2 days after he sustained a fall. Pain too severe to walk for last 48h. Reports dark colored urine. Patient is found to have Rhabdo, AKI, hyponatremia, hyperkalemia, +DVT. Ortho was consulted for BLE compartment syndrome. 6/15 he was taken emergently to OR for bilateral lower leg four-compartment fasciotomy. Plan is back to OR Sat for serial debridement and possible closure of fasciotomies.  HL is subtherapeutic at 0.15 on 1650 units/hr. No bleeding noted, Hb down to 12.2, platelets are low normal. Spoke with RN who stated no problems with bleeding or infusion.  Goal of Therapy:  Heparin level 0.3-0.7 units/ml Monitor platelets by anticoagulation protocol: Yes   Plan:  - Increase heparin drip to 1950 units/hr - 8 hr HL - Daily HL and CBC - Monitor for s/sx of bleeding - F/U after surgery tomorrow - F/U plan for oral anticoag therapy  [facility] CenterJennifer [place], Pharm.D.,  BCPS Clinical Pharmacist Pager: [phone] 01/04/2016 8:40 AM

## 2016-01-04 NOTE — H&P (Signed)
H&P update  The surgical history has been reviewed and remains accurate without interval change.  The patient was re-examined and patient's physiologic condition has not changed significantly in the last 30 days. The condition still exists that makes this procedure necessary. The treatment plan remains the same, without new options for care.  No new pharmacological allergies or types of therapy has been initiated that would change the plan or the appropriateness of the plan.  The patient and/or family understand the potential benefits and risks.  Mayra ReelN. Michael Clytie Shetley, MD 01/04/2016 11:00 AM

## 2016-01-04 NOTE — Progress Notes (Signed)
PT Cancellation Note  Patient Details Name: Jeffery Gross MRN: [id number] DOB: 03/11/1961   Cancelled Treatment:    Reason Eval/Treat Not Completed: Patient declined, as he had just woken up and started eating (slept through lunch). Family present asking for therapist to return later. Pt was encouraged to participate but continued to decline. Will check back as schedule allows to complete PT eval.    [name], [name] 01/04/2016, 2:04 PM   Conni SlipperLaura Hermen [name], PT, DPT Acute Rehabilitation Services Pager: [phone]

## 2016-01-04 NOTE — Consult Note (Signed)
[facility] Renal Consultation Note  Requesting MD: Gwendlyn Deutscher Indication for Consultation: AKI with rhabdo   HPI:  Jeffery Gross is a 55 y.o. male with past mental history significant for hypertension, depression and chronic pain.  He presented to the emergency department on 6/15 with a history of a fall on 6/13 being unable to ambulate after that, essentially staying in bed for 48 hours with minimal by mouth intake.  Initial labs showed a creatinine of 5.4, potassium 5.5, sodium 122 with a CK of over 50,000 and a lactate of 2.7.  He has been hydrated overnight at least 2400- today labs show creatinine of 6.44 but potassium has normalized and sodium is increased. CK still greater than 50,000. There has been minimal urine output.  Renal ultrasound is within normal limits.  Urinalysis shows large blood and is brown in color which is not surprising.  He had bilateral compartment syndrome and required operative intervention which was done yesterday.  Right now patient is complaining of pain but no nausea or shortness of breath. Also said that he took 3 ibuprofen before presenting to the hospital  CREATININE, SER  Date/Time Value Ref Range Status  01/04/2016 02:44 AM 6.44* 0.61 - 1.24 mg/dL Final  01/03/2016 11:49 AM 5.40* 0.61 - 1.24 mg/dL Final     PMHx:   Past Medical History  Diagnosis Date  . Chest pain   . DDD (degenerative disc disease), cervical   . Back pain   . HTN (hypertension)   . Depression     Past Surgical History  Procedure Laterality Date  . Vasectomy      Family Hx:  Family History  Problem Relation Age of Onset  . Hypertension Father     Social History:  reports that he has been smoking Cigars.  He does not have any smokeless tobacco history on file. He reports that he drinks alcohol. His drug history is not on file.  Allergies:  Allergies  Allergen Reactions  . Sulfacetamide Sodium     edema    Medications: Prior to Admission medications    Medication Sig Start Date End Date Taking? Authorizing Provider  ALPRAZolam Duanne Moron) 0.5 MG tablet Take 0.5 mg by mouth at bedtime as needed for sleep.   Yes Historical Provider, MD  amitriptyline (ELAVIL) 50 MG tablet Take 50 mg by mouth at bedtime.   Yes Historical Provider, MD  amLODipine (NORVASC) 10 MG tablet Take 10 mg by mouth daily.   Yes Historical Provider, MD  cetirizine (ZYRTEC) 10 MG tablet Take 10 mg by mouth daily.   Yes Historical Provider, MD  citalopram (CELEXA) 40 MG tablet Take 40 mg by mouth daily.   Yes Historical Provider, MD  clonazePAM (KLONOPIN) 0.5 MG tablet Take 0.5 mg by mouth 2 (two) times daily as needed for anxiety.   Yes Historical Provider, MD  cyclobenzaprine (FLEXERIL) 10 MG tablet Take 10 mg by mouth every 8 (eight) hours as needed. Muscle spasms 11/17/15  Yes Historical Provider, MD  hydrochlorothiazide (HYDRODIURIL) 25 MG tablet Take 25 mg by mouth daily.   Yes Historical Provider, MD  montelukast (SINGULAIR) 10 MG tablet Take 10 mg by mouth daily as needed. allergies 12/25/15  Yes Historical Provider, MD  oxyCODONE (ROXICODONE) 15 MG immediate release tablet Take 15 mg by mouth every 8 (eight) hours as needed. pain 12/24/15  Yes Historical Provider, MD  pantoprazole (PROTONIX) 40 MG tablet Take 40 mg by mouth daily. 10/26/15  Yes Historical Provider, MD  risperiDONE (  RISPERDAL) 1 MG tablet Take 1 mg by mouth at bedtime. 10/26/15  Yes Historical Provider, MD    I have reviewed the patient's current medications.  Labs:  Results for orders placed or performed during the hospital encounter of 01/03/16 (from the past 48 hour(s))  CBC with Differential/Platelet     Status: Abnormal   Collection Time: 01/03/16 11:42 AM  Result Value Ref Range   WBC 20.0 (H) 4.0 - 10.5 K/uL   RBC 5.92 (H) 4.22 - 5.81 MIL/uL   Hemoglobin 16.6 13.0 - 17.0 g/dL   HCT 46.7 39.0 - 52.0 %   MCV 78.9 78.0 - 100.0 fL   MCH 28.0 26.0 - 34.0 pg   MCHC 35.5 30.0 - 36.0 g/dL   RDW 13.0 11.5 -  15.5 %   Platelets 197 150 - 400 K/uL   Neutrophils Relative % 84 %   Neutro Abs 16.7 (H) 1.7 - 7.7 K/uL   Lymphocytes Relative 5 %   Lymphs Abs 1.0 0.7 - 4.0 K/uL   Monocytes Relative 11 %   Monocytes Absolute 2.2 (H) 0.1 - 1.0 K/uL   Eosinophils Relative 0 %   Eosinophils Absolute 0.0 0.0 - 0.7 K/uL   Basophils Relative 0 %   Basophils Absolute 0.0 0.0 - 0.1 K/uL  CK     Status: Abnormal   Collection Time: 01/03/16 11:42 AM  Result Value Ref Range   Total CK >50000 (H) 49 - 397 U/L    Comment: RESULTS CONFIRMED BY MANUAL DILUTION  I-stat troponin, ED     Status: None   Collection Time: 01/03/16 11:46 AM  Result Value Ref Range   Troponin i, poc 0.00 0.00 - 0.08 ng/mL   Comment 3            Comment: Due to the release kinetics of cTnI, a negative result within the first hours of the onset of symptoms does not rule out myocardial infarction with certainty. If myocardial infarction is still suspected, repeat the test at appropriate intervals.   I-stat chem 8, ed     Status: Abnormal   Collection Time: 01/03/16 11:49 AM  Result Value Ref Range   Sodium 122 (L) 135 - 145 mmol/L   Potassium 5.5 (H) 3.5 - 5.1 mmol/L   Chloride 96 (L) 101 - 111 mmol/L   BUN 68 (H) 6 - 20 mg/dL   Creatinine, Ser 5.40 (H) 0.61 - 1.24 mg/dL   Glucose, Bld 106 (H) 65 - 99 mg/dL   Calcium, Ion 0.74 (L) 1.12 - 1.23 mmol/L   TCO2 20 0 - 100 mmol/L   Hemoglobin 18.4 (H) 13.0 - 17.0 g/dL   HCT 54.0 (H) 39.0 - 52.0 %   Comment NOTIFIED PHYSICIAN   I-Stat CG4 Lactic Acid, ED     Status: Abnormal   Collection Time: 01/03/16 11:49 AM  Result Value Ref Range   Lactic Acid, Venous 2.72 (HH) 0.5 - 2.0 mmol/L   Comment NOTIFIED PHYSICIAN   Urinalysis, Routine w reflex microscopic (not at [facility])     Status: Abnormal   Collection Time: 01/03/16  2:12 PM  Result Value Ref Range   Color, Urine BROWN (A) YELLOW    Comment: BIOCHEMICALS MAY BE AFFECTED BY COLOR   APPearance TURBID (A) CLEAR   Specific  Gravity, Urine 1.024 1.005 - 1.030   pH 5.5 5.0 - 8.0   Glucose, UA 250 (A) NEGATIVE mg/dL   Hgb urine dipstick LARGE (A) NEGATIVE   Bilirubin Urine NEGATIVE  NEGATIVE   Ketones, ur NEGATIVE NEGATIVE mg/dL   Protein, ur >300 (A) NEGATIVE mg/dL   Nitrite NEGATIVE NEGATIVE   Leukocytes, UA NEGATIVE NEGATIVE  Urine microscopic-add on     Status: Abnormal   Collection Time: 01/03/16  2:12 PM  Result Value Ref Range   Squamous Epithelial / LPF 6-30 (A) NONE SEEN   WBC, UA 0-5 0 - 5 WBC/hpf   RBC / HPF 6-30 0 - 5 RBC/hpf   Bacteria, UA FEW (A) NONE SEEN   Urine-Other AMORPHOUS URATES/PHOSPHATES     Comment: URINALYSIS PERFORMED ON SUPERNATANT  I-Stat CG4 Lactic Acid, ED     Status: None   Collection Time: 01/03/16  2:27 PM  Result Value Ref Range   Lactic Acid, Venous 1.30 0.5 - 2.0 mmol/L  Surgical pcr screen     Status: Abnormal   Collection Time: 01/03/16  5:05 PM  Result Value Ref Range   MRSA, PCR POSITIVE (A) NEGATIVE    Comment: RESULT CALLED TO, READ BACK BY AND VERIFIED WITHHenrietta Dine RN 2151 01/03/16 A BROWNING    Staphylococcus aureus POSITIVE (A) NEGATIVE    Comment:        The Xpert SA Assay (FDA approved for NASAL specimens in patients over 73 years of age), is one component of a comprehensive surveillance program.  Test performance has been validated by [facility] for patients greater than or equal to 27 year old. It is not intended to diagnose infection nor to guide or monitor treatment.   Glucose, capillary     Status: None   Collection Time: 01/03/16  5:06 PM  Result Value Ref Range   Glucose-Capillary 98 65 - 99 mg/dL  CK     Status: Abnormal   Collection Time: 01/04/16  2:44 AM  Result Value Ref Range   Total CK >50000 (H) 49 - 397 U/L    Comment: RESULTS CONFIRMED BY MANUAL DILUTION  Lactic acid, plasma     Status: None   Collection Time: 01/04/16  2:44 AM  Result Value Ref Range   Lactic Acid, Venous 1.0 0.5 - 2.0 mmol/L  Comprehensive metabolic  panel     Status: Abnormal   Collection Time: 01/04/16  2:44 AM  Result Value Ref Range   Sodium 125 (L) 135 - 145 mmol/L   Potassium 4.6 3.5 - 5.1 mmol/L   Chloride 95 (L) 101 - 111 mmol/L   CO2 15 (L) 22 - 32 mmol/L   Glucose, Bld 110 (H) 65 - 99 mg/dL   BUN 76 (H) 6 - 20 mg/dL   Creatinine, Ser 6.44 (H) 0.61 - 1.24 mg/dL   Calcium 6.8 (L) 8.9 - 10.3 mg/dL   Total Protein 4.8 (L) 6.5 - 8.1 g/dL   Albumin 2.1 (L) 3.5 - 5.0 g/dL   AST 1428 (H) 15 - 41 U/L   ALT 624 (H) 17 - 63 U/L   Alkaline Phosphatase 55 38 - 126 U/L   Total Bilirubin 0.4 0.3 - 1.2 mg/dL   GFR calc non Af Amer 9 (L) >60 mL/min   GFR calc Af Amer 10 (L) >60 mL/min    Comment: (NOTE) The eGFR has been calculated using the CKD EPI equation. This calculation has not been validated in all clinical situations. eGFR's persistently <60 mL/min signify possible Chronic Kidney Disease.    Anion gap 15 5 - 15  TSH     Status: None   Collection Time: 01/04/16  2:44 AM  Result Value  Ref Range   TSH 1.191 0.350 - 4.500 uIU/mL  Heparin level (unfractionated)     Status: Abnormal   Collection Time: 01/04/16  2:44 AM  Result Value Ref Range   Heparin Unfractionated <0.10 (L) 0.30 - 0.70 IU/mL    Comment:        IF HEPARIN RESULTS ARE BELOW EXPECTED VALUES, AND PATIENT DOSAGE HAS BEEN CONFIRMED, SUGGEST FOLLOW UP TESTING OF ANTITHROMBIN III LEVELS.   CBC     Status: Abnormal   Collection Time: 01/04/16  2:44 AM  Result Value Ref Range   WBC 13.1 (H) 4.0 - 10.5 K/uL   RBC 4.48 4.22 - 5.81 MIL/uL   Hemoglobin 12.2 (L) 13.0 - 17.0 g/dL    Comment: DELTA CHECK NOTED REPEATED TO VERIFY    HCT 36.2 (L) 39.0 - 52.0 %   MCV 80.8 78.0 - 100.0 fL   MCH 27.2 26.0 - 34.0 pg   MCHC 33.7 30.0 - 36.0 g/dL   RDW 13.2 11.5 - 15.5 %   Platelets 166 150 - 400 K/uL  Heparin level (unfractionated)     Status: Abnormal   Collection Time: 01/04/16  7:46 AM  Result Value Ref Range   Heparin Unfractionated 0.15 (L) 0.30 - 0.70 IU/mL     Comment:        IF HEPARIN RESULTS ARE BELOW EXPECTED VALUES, AND PATIENT DOSAGE HAS BEEN CONFIRMED, SUGGEST FOLLOW UP TESTING OF ANTITHROMBIN III LEVELS.      ROS:  A comprehensive review of systems was negative except for: Musculoskeletal: positive for Bilateral leg pain  Physical Exam: Filed Vitals:   01/04/16 0508 01/04/16 0930  BP: 147/84 150/79  Pulse: 98   Temp: 98.1 F (36.7 C)   Resp: 20      General: Slightly chronically ill appearing white male alert and in no acute distress HEENT: Pupils are equally round and reactive to light, extraocular motions are intact, mucous membranes are dry. Does have some swelling to the right side of his face Neck: No JVD Heart: Tachycardic  Lungs: Anterior exam mostly clear  Abdomen: Slightly distended but nontender  Extremities: Some dependent edema. Has lower extremity bilateral wounds after fasciotomy  Skin: Warm and dry  Neuro:Alert and nonfocal  Foley bag with 100 mL of dark-colored urine    Assessment/Plan: 55 year old white male with hypertension and chronic pain who presents with rhabdo after his fall and immobility in the setting of narcotics and Klonopin 1.Renal- AKI most likely due to rhabdomyolysis after fall and staying down. Baseline renal function is not known.  Renal ultrasound was unremarkable and urinalysis basically consistent with rhabdo. I agree with aggressive IV hydration at this time including some fluids with sodium bicarbonate. Currently receiving fluids at 200/h with no evidence of volume overload.  Does have a small amount of urine in his Foley bag.  There are no absolute indications for dialysis at this time. I have discussed with the patient that this likely will get worse before it gets better there is a chance that he would require dialysis before this is all said and done. Continue to check kidney function and CK labs daily 2. Hypertension/volume  - blood pressure is not low. Difficult to know how much  anxiety is affecting. Continuing amlodipine for now. IV fluids will likely cause blood pressure go higher. No need to add another agent at this time 3. Anemia  - not an issue at this time    thank you for this consultation. I will  continue to follow with you   Gurshaan Matsuoka A 01/04/2016, 11:49 AM

## 2016-01-05 ENCOUNTER — Inpatient Hospital Stay (HOSPITAL_COMMUNITY): Payer: BLUE CROSS/BLUE SHIELD | Admitting: Anesthesiology

## 2016-01-05 ENCOUNTER — Encounter (HOSPITAL_COMMUNITY)
Admission: EM | Disposition: A | Discharge status: Rehabilitation Facility | Payer: Self-pay | Source: Home / Self Care | Attending: Family Medicine

## 2016-01-05 ENCOUNTER — Encounter (HOSPITAL_COMMUNITY): Payer: Self-pay | Admitting: Anesthesiology

## 2016-01-05 DIAGNOSIS — N179 Acute kidney failure, unspecified: Secondary | ICD-10-CM | POA: Insufficient documentation

## 2016-01-05 DIAGNOSIS — T796XXA Traumatic ischemia of muscle, initial encounter: Secondary | ICD-10-CM | POA: Insufficient documentation

## 2016-01-05 HISTORY — PX: I & D EXTREMITY: SHX5045

## 2016-01-05 LAB — CBC
HCT: 32.4 % — ABNORMAL LOW (ref 39.0–52.0)
Hemoglobin: 10.9 g/dL — ABNORMAL LOW (ref 13.0–17.0)
MCH: 26.4 pg (ref 26.0–34.0)
MCHC: 33.6 g/dL (ref 30.0–36.0)
MCV: 78.5 fL (ref 78.0–100.0)
PLATELETS: 157 10*3/uL (ref 150–400)
RBC: 4.13 MIL/uL — AB (ref 4.22–5.81)
RDW: 12.9 % (ref 11.5–15.5)
WBC: 9.3 10*3/uL (ref 4.0–10.5)

## 2016-01-05 LAB — RENAL FUNCTION PANEL
ALBUMIN: 1.7 g/dL — AB (ref 3.5–5.0)
Anion gap: 15 (ref 5–15)
BUN: 97 mg/dL — AB (ref 6–20)
CALCIUM: 6.7 mg/dL — AB (ref 8.9–10.3)
CHLORIDE: 90 mmol/L — AB (ref 101–111)
CO2: 20 mmol/L — ABNORMAL LOW (ref 22–32)
CREATININE: 8.64 mg/dL — AB (ref 0.61–1.24)
GFR, EST AFRICAN AMERICAN: 7 mL/min — AB (ref >60)
GFR, EST NON AFRICAN AMERICAN: 6 mL/min — AB (ref >60)
Glucose, Bld: 116 mg/dL — ABNORMAL HIGH (ref 65–99)
PHOSPHORUS: 10.7 mg/dL — AB (ref 2.5–4.6)
Potassium: 4.5 mmol/L (ref 3.5–5.1)
SODIUM: 125 mmol/L — AB (ref 135–145)

## 2016-01-05 LAB — POCT I-STAT 7, (LYTES, BLD GAS, ICA,H+H)
Acid-base deficit: 5 mmol/L — ABNORMAL HIGH (ref 0.0–2.0)
BICARBONATE: 21.2 meq/L (ref 20.0–24.0)
CALCIUM ION: 0.82 mmol/L — AB (ref 1.12–1.23)
HEMATOCRIT: 26 % — AB (ref 39.0–52.0)
Hemoglobin: 8.8 g/dL — ABNORMAL LOW (ref 13.0–17.0)
O2 SAT: 100 %
PH ART: 7.329 — AB (ref 7.350–7.450)
Patient temperature: 36.1
Potassium: 4.4 mmol/L (ref 3.5–5.1)
SODIUM: 124 mmol/L — AB (ref 135–145)
TCO2: 22 mmol/L (ref 0–100)
pCO2 arterial: 39.8 mmHg (ref 35.0–45.0)
pO2, Arterial: 436 mmHg — ABNORMAL HIGH (ref 80.0–100.0)

## 2016-01-05 LAB — ABO/RH: ABO/RH(D): O POS

## 2016-01-05 LAB — CK: CK TOTAL: 420003 U/L — AB (ref 49–397)

## 2016-01-05 SURGERY — IRRIGATION AND DEBRIDEMENT EXTREMITY
Anesthesia: General | Laterality: Bilateral

## 2016-01-05 MED ORDER — HEPARIN (PORCINE) IN NACL 100-0.45 UNIT/ML-% IJ SOLN
1950.0000 [IU]/h | INTRAMUSCULAR | Status: DC
  Administered 2016-01-06: 1950 [IU]/h via INTRAVENOUS
  Filled 2016-01-05: qty 250

## 2016-01-05 MED ORDER — ONDANSETRON HCL 4 MG/2ML IJ SOLN
INTRAMUSCULAR | Status: AC
  Filled 2016-01-05: qty 2

## 2016-01-05 MED ORDER — SODIUM CHLORIDE 0.9 % IR SOLN
Status: DC | PRN
  Administered 2016-01-05: 3000 mL

## 2016-01-05 MED ORDER — MIDAZOLAM HCL 5 MG/5ML IJ SOLN
INTRAMUSCULAR | Status: DC | PRN
  Administered 2016-01-05: 2 mg via INTRAVENOUS

## 2016-01-05 MED ORDER — CEFAZOLIN SODIUM-DEXTROSE 2-3 GM-% IV SOLR
INTRAVENOUS | Status: DC | PRN
  Administered 2016-01-05: 2 g via INTRAVENOUS

## 2016-01-05 MED ORDER — PROPOFOL 10 MG/ML IV BOLUS
INTRAVENOUS | Status: AC
  Filled 2016-01-05: qty 20

## 2016-01-05 MED ORDER — PHENYLEPHRINE HCL 10 MG/ML IJ SOLN
INTRAMUSCULAR | Status: DC | PRN
  Administered 2016-01-05 (×2): 120 ug via INTRAVENOUS
  Administered 2016-01-05: 80 ug via INTRAVENOUS
  Administered 2016-01-05 (×4): 120 ug via INTRAVENOUS

## 2016-01-05 MED ORDER — MUPIROCIN 2 % EX OINT
1.0000 "application " | TOPICAL_OINTMENT | Freq: Two times a day (BID) | CUTANEOUS | Status: AC
  Administered 2016-01-05 – 2016-01-09 (×10): 1 via NASAL
  Filled 2016-01-05 (×3): qty 22

## 2016-01-05 MED ORDER — LIDOCAINE HCL (CARDIAC) 20 MG/ML IV SOLN
INTRAVENOUS | Status: DC | PRN
Start: 2016-01-05 — End: 2016-01-05
  Administered 2016-01-05: 10 mg via INTRAVENOUS

## 2016-01-05 MED ORDER — ROCURONIUM BROMIDE 50 MG/5ML IV SOLN
INTRAVENOUS | Status: AC
  Filled 2016-01-05: qty 1

## 2016-01-05 MED ORDER — SODIUM CHLORIDE 0.9 % IV SOLN
INTRAVENOUS | Status: DC | PRN
  Administered 2016-01-05: 08:00:00 via INTRAVENOUS

## 2016-01-05 MED ORDER — PHENYLEPHRINE HCL 10 MG/ML IJ SOLN
20.0000 mg | INTRAVENOUS | Status: DC | PRN
  Administered 2016-01-05: 30 ug/min via INTRAVENOUS

## 2016-01-05 MED ORDER — ONDANSETRON HCL 4 MG/2ML IJ SOLN
INTRAMUSCULAR | Status: DC | PRN
  Administered 2016-01-05: 4 mg via INTRAVENOUS

## 2016-01-05 MED ORDER — FENTANYL CITRATE (PF) 250 MCG/5ML IJ SOLN
INTRAMUSCULAR | Status: AC
  Filled 2016-01-05: qty 5

## 2016-01-05 MED ORDER — SODIUM CHLORIDE 0.9 % IV SOLN
Freq: Once | INTRAVENOUS | Status: AC
  Administered 2016-01-09 (×2): via INTRAVENOUS

## 2016-01-05 MED ORDER — PROPOFOL 10 MG/ML IV BOLUS
INTRAVENOUS | Status: DC | PRN
  Administered 2016-01-05: 200 mg via INTRAVENOUS

## 2016-01-05 MED ORDER — HYDROMORPHONE HCL 1 MG/ML IJ SOLN
2.0000 mg | Freq: Once | INTRAMUSCULAR | Status: AC
  Administered 2016-01-05: 2 mg via INTRAVENOUS
  Filled 2016-01-05: qty 2

## 2016-01-05 MED ORDER — FENTANYL CITRATE (PF) 100 MCG/2ML IJ SOLN
INTRAMUSCULAR | Status: DC | PRN
  Administered 2016-01-05: 25 ug via INTRAVENOUS

## 2016-01-05 MED ORDER — CHLORHEXIDINE GLUCONATE CLOTH 2 % EX PADS
6.0000 | MEDICATED_PAD | Freq: Every day | CUTANEOUS | Status: AC
  Administered 2016-01-05 – 2016-01-09 (×5): 6 via TOPICAL

## 2016-01-05 MED ORDER — MIDAZOLAM HCL 2 MG/2ML IJ SOLN
INTRAMUSCULAR | Status: AC
  Filled 2016-01-05: qty 2

## 2016-01-05 SURGICAL SUPPLY — 63 items
BANDAGE ELASTIC 3 VELCRO ST LF (GAUZE/BANDAGES/DRESSINGS) IMPLANT
BLADE SURG 10 STRL SS (BLADE) ×5 IMPLANT
BNDG COHESIVE 4X5 TAN STRL (GAUZE/BANDAGES/DRESSINGS) ×3 IMPLANT
BNDG CONFORM 3 STRL LF (GAUZE/BANDAGES/DRESSINGS) IMPLANT
BNDG GAUZE STRTCH 6 (GAUZE/BANDAGES/DRESSINGS) ×9 IMPLANT
CANISTER WOUND CARE 500ML ATS (WOUND CARE) ×6 IMPLANT
CONNECTOR Y ATS VAC SYSTEM (MISCELLANEOUS) ×6 IMPLANT
CORDS BIPOLAR (ELECTRODE) IMPLANT
COVER SURGICAL LIGHT HANDLE (MISCELLANEOUS) ×3 IMPLANT
CUFF TOURNIQUET SINGLE 34IN LL (TOURNIQUET CUFF) ×6 IMPLANT
DRAPE EXTREMITY BILATERAL (DRAPES) ×2 IMPLANT
DRAPE IMP U-DRAPE 54X76 (DRAPES) ×2 IMPLANT
DRAPE INCISE IOBAN 85X60 (DRAPES) ×6 IMPLANT
DRAPE PROXIMA HALF (DRAPES) ×4 IMPLANT
DRAPE SURG 17X23 STRL (DRAPES) IMPLANT
DRAPE U-SHAPE 47X51 STRL (DRAPES) ×3 IMPLANT
DRSG VAC ATS LRG SENSATRAC (GAUZE/BANDAGES/DRESSINGS) ×6 IMPLANT
ELECT CAUTERY BLADE 6.4 (BLADE) ×3 IMPLANT
ELECT REM PT RETURN 9FT ADLT (ELECTROSURGICAL)
ELECTRODE REM PT RTRN 9FT ADLT (ELECTROSURGICAL) IMPLANT
FACESHIELD WRAPAROUND (MASK) ×3 IMPLANT
FACESHIELD WRAPAROUND OR TEAM (MASK) IMPLANT
GAUZE SPONGE 4X4 12PLY STRL (GAUZE/BANDAGES/DRESSINGS) ×6 IMPLANT
GAUZE XEROFORM 1X8 LF (GAUZE/BANDAGES/DRESSINGS) ×1 IMPLANT
GAUZE XEROFORM 5X9 LF (GAUZE/BANDAGES/DRESSINGS) ×1 IMPLANT
GLOVE BIO SURGEON STRL SZ7 (GLOVE) ×2 IMPLANT
GLOVE BIO SURGEON STRL SZ8 (GLOVE) ×2 IMPLANT
GLOVE BIOGEL PI IND STRL 6.5 (GLOVE) IMPLANT
GLOVE BIOGEL PI INDICATOR 6.5 (GLOVE) ×4
GLOVE SKINSENSE NS SZ7.5 (GLOVE) ×4
GLOVE SKINSENSE STRL SZ7.5 (GLOVE) ×2 IMPLANT
GOWN STRL REIN XL XLG (GOWN DISPOSABLE) ×6 IMPLANT
KIT BASIN OR (CUSTOM PROCEDURE TRAY) ×3 IMPLANT
KIT ROOM TURNOVER OR (KITS) ×3 IMPLANT
MANIFOLD NEPTUNE II (INSTRUMENTS) ×3 IMPLANT
NS IRRIG 1000ML POUR BTL (IV SOLUTION) ×4 IMPLANT
PACK ORTHO EXTREMITY (CUSTOM PROCEDURE TRAY) ×3 IMPLANT
PAD ABD 8X10 STRL (GAUZE/BANDAGES/DRESSINGS) ×3 IMPLANT
PAD ARMBOARD 7.5X6 YLW CONV (MISCELLANEOUS) ×6 IMPLANT
PAD NEG PRESSURE SENSATRAC (MISCELLANEOUS) ×6 IMPLANT
PADDING CAST ABS 4INX4YD NS (CAST SUPPLIES) ×4
PADDING CAST ABS COTTON 4X4 ST (CAST SUPPLIES) ×2 IMPLANT
PADDING CAST COTTON 6X4 STRL (CAST SUPPLIES) ×3 IMPLANT
SPONGE LAP 18X18 X RAY DECT (DISPOSABLE) ×10 IMPLANT
STOCKINETTE IMPERVIOUS 9X36 MD (GAUZE/BANDAGES/DRESSINGS) ×3 IMPLANT
SUT ETHILON 2 0 FS 18 (SUTURE) ×3 IMPLANT
SUT ETHILON 2 0 PSLX (SUTURE) ×1 IMPLANT
SUT ETHILON 3 0 PS 1 (SUTURE) ×2 IMPLANT
SUT SILK 2 0 (SUTURE) ×3
SUT SILK 2-0 18XBRD TIE 12 (SUTURE) IMPLANT
SUT VIC AB 2-0 CT1 36 (SUTURE) ×1 IMPLANT
SUT VIC AB 2-0 FS1 27 (SUTURE) ×2 IMPLANT
SYR CONTROL 10ML LL (SYRINGE) IMPLANT
TOWEL OR 17X24 6PK STRL BLUE (TOWEL DISPOSABLE) ×3 IMPLANT
TOWEL OR 17X26 10 PK STRL BLUE (TOWEL DISPOSABLE) ×3 IMPLANT
TUBE ANAEROBIC SPECIMEN COL (MISCELLANEOUS) IMPLANT
TUBE CONNECTING 12'X1/4 (SUCTIONS) ×1
TUBE CONNECTING 12X1/4 (SUCTIONS) ×2 IMPLANT
TUBE FEEDING 5FR 15 INCH (TUBING) IMPLANT
TUBING CYSTO DISP (UROLOGICAL SUPPLIES) ×3 IMPLANT
UNDERPAD 30X30 INCONTINENT (UNDERPADS AND DIAPERS) ×6 IMPLANT
WATER STERILE IRR 1000ML POUR (IV SOLUTION) ×3 IMPLANT
YANKAUER SUCT BULB TIP NO VENT (SUCTIONS) ×3 IMPLANT

## 2016-01-05 NOTE — Transfer of Care (Signed)
Immediate Anesthesia Transfer of Care Note  Patient: Jeffery Gross  Procedure(s) Performed: Procedure(s): IRRIGATION AND DEBRIDEMENT BILATERAL LOWER EXTREMITIES; WOUND VAC CHANGE (Bilateral)  Patient Location: PACU  Anesthesia Type:General  Level of Consciousness: awake, alert  and patient cooperative  Airway & Oxygen Therapy: Patient Spontanous Breathing  Post-op Assessment: Report given to RN and Post -op Vital signs reviewed and stable  Post vital signs: Reviewed and stable  Last Vitals:  Filed Vitals:   01/04/16 2311 01/05/16 0611  BP: 125/87 134/77  Pulse: 94 90  Temp: 36.5 C   Resp: 16 15    Last Pain:  Filed Vitals:   01/05/16 0946  PainSc: Asleep      Patients Stated Pain Goal: 3 (01/05/16 0357)  Complications: No apparent anesthesia complications

## 2016-01-05 NOTE — Progress Notes (Signed)
PT Cancellation Note  Patient Details Name: Jeffery Gross MRN: [id number] DOB: 03-18-1961   Cancelled Treatment:    Reason Eval/Treat Not Completed: Patient at procedure or test/unavailable (Pt in OR). Will try again tomorrow.   Edu On 01/05/2016, 9:11 AM  [facility] Oneisha Ammons PT [phone]

## 2016-01-05 NOTE — Progress Notes (Signed)
OT Cancellation Note  Patient Details Name: Jeffery Gross MRN: [id number] DOB: 1960/12/04   Cancelled Treatment:    Reason Eval/Treat Not Completed: Medical issues which prohibited therapy. Pt returned to OR. Transferred to 2H. Will follow up on Monday.   [facility] Center[facility],HILLARY  Latorria Zeoli, OTR/L  [phone] 01/05/2016 01/05/2016, 11:56 AM

## 2016-01-05 NOTE — Op Note (Addendum)
   Date of Surgery: 01/05/2016  INDICATIONS: [name] is a 55 y.o.-year-old male with a bilateral lower leg compartment syndrome s/p 4 compartment fasciotomies;  The patient did consent to the procedure after discussion of the risks and benefits.  PREOPERATIVE DIAGNOSIS: bilateral lower leg fasciotomies  POSTOPERATIVE DIAGNOSIS: Same.  PROCEDURE: 1. Irrigation and debridement of muscle and skin of right lower extremity medial wound 27 cm x 5 cm x 5 cm; lateral wound 35 cm x 8 cm x 7 cm 2. Irrigation and debridement of muscle and skin of left lower extremity medial wound 35 cm x 7 cm x 2 cm; lateral wound 15 cm x 5 cm x 1 cm 3. Application of wound VAC greater than 50 cm  SURGEON: Jeffery Gross, M.D.  ASSIST: None.  ANESTHESIA:  general  IV FLUIDS AND URINE: See anesthesia.  ESTIMATED BLOOD LOSS: See anesthesia record  IMPLANTS: None  DRAINS: Bilateral lower leg wound VAC  COMPLICATIONS: None.  DESCRIPTION OF PROCEDURE: The patient was brought to the operating room and placed supine on the operating table.  The patient had been signed prior to the procedure and this was documented. The patient had the anesthesia placed by the anesthesiologist.  A time-out was performed to confirm that this was the correct patient, site, side and location. The patient did receive antibiotics prior to the incision and was re-dosed during the procedure as needed at indicated intervals.  A tourniquet was placed.  The patient had the operative extremities prepped and draped in the standard surgical fashion.    We first evaluated the right lower extremity. After inspecting the muscles in each of the 4 muscular compartments it was obvious that essentially all of the muscles had necrosis. The muscles did not contract with the Bovie stimulation. The muscles were nonviable. There was also necrotic odor to the muscle.  Sharp excisional debridement of the muscles in all 4 lower leg compartments was performed.  Essentially I skeletonized the right lower extremity because I had to debride most of the tissue. There was no evidence of viability of the right lower leg.  After thorough debridement we thoroughly irrigated the right lower leg with normal saline. A wound VAC was placed and turned to -125 mmHg.  We then turned our attention to the left lower extremity. The anterior and lateral compartments of the lower leg were fully viable. The muscle had a healthy appearance. The muscle also contracted with Bovie stimulation. Very superficial debridement of the muscle was performed back to a bleeding surface. I then inspected the posterior compartments and had evidence of nonviable muscle. This was sharply debrided with a rongeur and knife back to bleeding surfaces. Overall the posterior compartments appeared to be viable. No other compartments were able to be closed. After thorough debridement of the left lower extremity this was thoroughly irrigated with normal saline. A wound VAC was reapplied. Patient tolerated procedure well and no many complications.  POSTOPERATIVE PLAN: The patient is in critical condition. We will have the patient admitted to the ICU for closer monitoring. The patient will likely need an above the knee amputation on the right side. It is still too early to decide how the left lower leg will turn out.  Jeffery ReelN. Jeffery Jeffery Vadala, MD [facility] [phone] 9:28 AM

## 2016-01-05 NOTE — Anesthesia Postprocedure Evaluation (Signed)
Anesthesia Post Note  Patient: Jeffery Gross  Procedure(s) Performed: Procedure(s) (LRB): IRRIGATION AND DEBRIDEMENT BILATERAL LOWER EXTREMITIES; WOUND VAC CHANGE (Bilateral)  Patient location during evaluation: PACU Anesthesia Type: General Level of consciousness: awake Pain management: pain level controlled Vital Signs Assessment: post-procedure vital signs reviewed and stable Respiratory status: spontaneous breathing Cardiovascular status: stable Postop Assessment: no signs of nausea or vomiting Anesthetic complications: no    Last Vitals:  Filed Vitals:   01/05/16 1635 01/05/16 1700  BP: 129/58 121/73  Pulse: 93 97  Temp:  36.7 C  Resp: 14 16    Last Pain:  Filed Vitals:   01/05/16 1759  PainSc: 8                  Dinesha Twiggs

## 2016-01-05 NOTE — Progress Notes (Signed)
Wound VAC dressing change to only the lateral RLE. 2 pieces of black sponge used in dressing.

## 2016-01-05 NOTE — Progress Notes (Signed)
Called by Tresa EndoKelly, RN to evaluate RLE. Wound vac blocked and RLE is more tense than previous exam. Pt states he is having more pain. 2+ DP pulse in RLE. Per RN, she paged orthopedics and was instructed to use common sense. Given the increased swelling and pain, will change wound vac tonight. Ordered an additional one time dose of Dilaudid 2mg  x 1 to be given as needed during the wound vac change.  Willadean CarolKaty Jerran Tappan, MD PGY-1

## 2016-01-05 NOTE — Progress Notes (Signed)
ANTICOAGULATION CONSULT NOTE - Follow Up Consult  Pharmacy Consult for Heparin Indication: Bilateral DVTs  Allergies  Allergen Reactions  . Sulfacetamide Sodium     edema    Patient Measurements: Height: 6\' 1"  (185.4 cm) Weight: 218 lb 11.1 oz (99.2 kg) IBW/kg (Calculated) : 79.9 Heparin Dosing Weight: 99 kg  Vital Signs: Temp: 98 F (36.7 C) (06/17 1700) Temp Source: Oral (06/17 1700) BP: 143/96 mmHg (06/17 2100) Pulse Rate: 97 (06/17 2100)  Labs:  Recent Labs  01/03/16 1142 01/03/16 1149 01/04/16 0244 01/04/16 0746 01/04/16 1201 01/04/16 1633 01/05/16 0350 01/05/16 0855  HGB 16.6 18.4* 12.2*  --   --   --  10.9* 8.8*  HCT 46.7 54.0* 36.2*  --   --   --  32.4* 26.0*  PLT 197  --  166  --   --   --  157  --   LABPROT  --   --   --   --  15.4*  --   --   --   INR  --   --   --   --  1.20  --   --   --   HEPARINUNFRC  --   --  <0.10* 0.15*  --  0.33  --   --   CREATININE  --  5.40* 6.44*  --   --   --  8.64*  --   CKTOTAL >50000*  --  >50000*  --   --   --  [id number]*  --     Estimated Creatinine Clearance: 12.1 mL/min (by C-G formula based on Cr of 8.64).   Assessment: 55 y/o male with B/L lower limb pain for 2 days after he sustained a fall. Pain too severe to walk for last 48h. Reports dark colored urine. Patient is found to have Rhabdo, AKI, hyponatremia, hyperkalemia, +DVT. Ortho was consulted for BLE compartment syndrome. 6/15 he was taken emergently to OR for bilateral lower leg four-compartment fasciotomy. The patient is s/p repeat fasciotomy I&D earlier today with application of wound vac.  This evening, pharmacy has been consulted to resume heparin ~24 hours after surgery. Surgery was completed around 1000 this AM. Will resume at the previously known therapeutic rate around 1000 on 6/18. Noted Hgb drop likely d/t ABLA with surgery - will monitor closely.   Goal of Therapy:  Heparin level 0.3-0.7 units/ml Monitor platelets by anticoagulation protocol: Yes    Plan:  1. Restart heparin at a rate of 1950 units/hr (19.5 ml/hr) starting on 6/18 @ 1000 2. Will continue to monitor for any signs/symptoms of bleeding and will follow up with heparin level in 8 hours after restarting  Georgina PillionElizabeth Leslieanne Cobarrubias, PharmD, BCPS Clinical Pharmacist Pager: [phone] 01/05/2016 9:58 PM

## 2016-01-05 NOTE — Progress Notes (Signed)
Pt with wound vac to RLE alarming that negative pressure dressing with blockage. It is noted at this time that the right lower extremity is taunt and more swollen then when previously assessed 3.5 hrs prior. Also there is serous drainage pooling beneath and around the black sponge of the VAC dressing. Pt with increased pain to RLE. DP pulse still 2+ at this time. Orthopedic MD on call notified and RN was instructed to disconnect the VAC. Pt is due in the OR later this am. Primary MD paged to bedside to assessed pt's RLE and advise RN to change dressing. Will change once supplies arrive to department. Will continue to monitor.

## 2016-01-05 NOTE — Anesthesia Preprocedure Evaluation (Addendum)
Anesthesia Evaluation  Patient identified by MRN, date of birth, ID band Patient awake    Reviewed: Allergy & Precautions, NPO status , Patient's Chart, lab work & pertinent test results  History of Anesthesia Complications Negative for: history of anesthetic complications  Airway Mallampati: III  TM Distance: >3 FB Neck ROM: Full    Dental  (+) Teeth Intact   Pulmonary Current Smoker,    breath sounds clear to auscultation       Cardiovascular hypertension, Pt. on medications (-) angina(-) Past MI and (-) CHF  Rhythm:Regular     Neuro/Psych PSYCHIATRIC DISORDERS Depression  Neuromuscular disease    GI/Hepatic negative GI ROS,   Endo/Other    Renal/GU ARFRenal disease     Musculoskeletal  (+) Arthritis ,   Abdominal   Peds  Hematology  (+) anemia ,   Anesthesia Other Findings rhabdo after fall with arf, elevated liver enzymes, patient appears underresuscitated upon arrival to short stay in setting of ARF, will attempt to volume optimize and avoid hypotension/pressors as tolerated intraop   Reproductive/Obstetrics                            Anesthesia Physical Anesthesia Plan  ASA: III  Anesthesia Plan: General   Post-op Pain Management:    Induction: Intravenous  Airway Management Planned: Oral ETT  Additional Equipment: None  Intra-op Plan:   Post-operative Plan: Extubation in OR  Informed Consent: I have reviewed the patients History and Physical, chart, labs and discussed the procedure including the risks, benefits and alternatives for the proposed anesthesia with the patient or authorized representative who has indicated his/her understanding and acceptance.   Dental advisory given  Plan Discussed with: CRNA and Surgeon  Anesthesia Plan Comments:         Anesthesia Quick Evaluation

## 2016-01-05 NOTE — Progress Notes (Signed)
Subjective:  Had to go back to OR for additional debridement- worried that he will need amputations- pt does not seem uremic- only 140 of urine- latest CK was 420,000  Objective Vital signs in last 24 hours: Filed Vitals:   01/05/16 0611 01/05/16 0935 01/05/16 0945 01/05/16 1000  BP: 134/77  116/61 107/70  Pulse: 90 89 86   Temp:  97.7 F (36.5 C)    TempSrc:      Resp: 15 15 10 9   Height:      Weight:      SpO2: 100% 98%  100%   Weight change: 8.2 kg (18 lb 1.2 oz)  Intake/Output Summary (Last 24 hours) at 01/05/16 1044 Last data filed at 01/05/16 0945  Gross per 24 hour  Intake 5106.58 ml  Output   1900 ml  Net 3206.58 ml    Assessment/Plan: 55 year old white male with hypertension and chronic pain who presents with rhabdo after his fall and immobility in the setting of narcotics and Klonopin 1.Renal- AKI most likely due to rhabdomyolysis after fall and staying down. Baseline renal function is not known. Renal ultrasound was unremarkable and urinalysis basically consistent with rhabdo. I agree with aggressive IV hydration at this time including some fluids with sodium bicarbonate. Currently receiving fluids at 300/h with no evidence of volume overload. Does have a small amount of urine in his Foley bag. There are no absolute indications for dialysis at this time. I have discussed with the patient that this likely will get worse before it gets better - if this does not turn around in the next 24-48 hours suspect he will need HD support.  Continue to check kidney function and CK labs daily 2. Hypertension/volume - blood pressure is not low. Difficult to know how much anxiety is affecting. Continuing amlodipine for now. IV fluids will likely cause blood pressure go higher. No need to add another agent at this time 3. Anemia - decreasing with volume   Aasir Daigler A    Labs: Basic Metabolic Panel:  Recent Labs Lab 01/03/16 1149 01/04/16 0244 01/05/16 0350  NA 122*  125* 125*  K 5.5* 4.6 4.5  CL 96* 95* 90*  CO2  --  15* 20*  GLUCOSE 106* 110* 116*  BUN 68* 76* 97*  CREATININE 5.40* 6.44* 8.64*  CALCIUM  --  6.8* 6.7*  PHOS  --   --  10.7*   Liver Function Tests:  Recent Labs Lab 01/04/16 0244 01/05/16 0350  AST 1428*  --   ALT 624*  --   ALKPHOS 55  --   BILITOT 0.4  --   PROT 4.8*  --   ALBUMIN 2.1* 1.7*   No results for input(s): LIPASE, AMYLASE in the last 168 hours. No results for input(s): AMMONIA in the last 168 hours. CBC:  Recent Labs Lab 01/03/16 1142 01/03/16 1149 01/04/16 0244 01/05/16 0350  WBC 20.0*  --  13.1* 9.3  NEUTROABS 16.7*  --   --   --   HGB 16.6 18.4* 12.2* 10.9*  HCT 46.7 54.0* 36.2* 32.4*  MCV 78.9  --  80.8 78.5  PLT 197  --  166 157   Cardiac Enzymes:  Recent Labs Lab 01/03/16 1142 01/04/16 0244 01/05/16 0350  CKTOTAL >50000* >50000* 420003*   CBG:  Recent Labs Lab 01/03/16 1706  GLUCAP 98    Iron Studies: No results for input(s): IRON, TIBC, TRANSFERRIN, FERRITIN in the last 72 hours. Studies/Results: Ct Abdomen Pelvis Wo Contrast  01/03/2016  CLINICAL DATA:  Bilateral hip and lower back pain after fall down stairs 2 days ago. EXAM: CT ABDOMEN AND PELVIS WITHOUT CONTRAST TECHNIQUE: Multidetector CT imaging of the abdomen and pelvis was performed following the standard protocol without IV contrast. COMPARISON:  CT scan of June 09, 2005. FINDINGS: Status post surgical posterior fusion of L5-S1. Visualized lung bases are unremarkable. Dilated gallbladder is noted without cholelithiasis or evidence of cholecystitis. No focal abnormality is noted in the liver, spleen or pancreas on these unenhanced images. Adrenal glands and kidneys appear normal. No hydronephrosis or renal obstruction is noted. No renal or ureteral calculi are noted. The appendix appears normal. There is no evidence of bowel obstruction. No abnormal fluid collection is noted. Urinary bladder appears normal. No significant  adenopathy is noted. IMPRESSION: No significant abnormality seen in the abdomen or pelvis. Electronically Signed   By: Lupita Raider, M.D.   On: 01/03/2016 12:50   Dg Chest 2 View  01/03/2016  CLINICAL DATA:  Injury with fall yesterday, bilateral leg pain, some shortness of breath. EXAM: CHEST  2 VIEW COMPARISON:  Chest x-ray dated 05/08/2014 FINDINGS: Heart size is normal. Mild scarring at the left lung base. Lungs otherwise clear. No pleural effusion or pneumothorax seen. Right costophrenic angle excluded on the AP view. Mild degenerative spurring and mild chronic-appearing compression deformities within the mid and lower thoracic spine. No acute-appearing osseous abnormality. IMPRESSION: 1. No acute findings. 2. Chronic/incidental findings detailed above. Electronically Signed   By: Bary Richard M.D.   On: 01/03/2016 11:33   Dg Pelvis 1-2 Views  01/03/2016  CLINICAL DATA:  Bilateral leg pain status post fall. EXAM: PELVIS - 1-2 VIEW COMPARISON:  None. FINDINGS: There is no evidence of pelvic fracture or diastasis. No pelvic bone lesions are seen. L4-L5 spinal fusion is noted without evidence of hardware breakage. IMPRESSION: No acute fracture or dislocation identified about the pelvis. Electronically Signed   By: Ted Mcalpine M.D.   On: 01/03/2016 11:33   Ct Head Wo Contrast  01/03/2016  CLINICAL DATA:  Status post fall with bruising of the right face and difficulty opening patient's mouth. EXAM: CT HEAD WITHOUT CONTRAST CT MAXILLOFACIAL WITHOUT CONTRAST TECHNIQUE: Multidetector CT imaging of the head and maxillofacial structures were performed using the standard protocol without intravenous contrast. Multiplanar CT image reconstructions of the maxillofacial structures were also generated. COMPARISON:  CT of the head 08/04/2013 FINDINGS: CT HEAD FINDINGS No mass effect or midline shift. No evidence of acute intracranial hemorrhage, or infarction. No abnormal extra-axial fluid collections.  Gray-white matter differentiation is normal. Basal cisterns are preserved. No depressed skull fractures. CT MAXILLOFACIAL FINDINGS The globes and extraocular muscles appear symmetrical. No air fluid levels in the paranasal sinuses. The frontal bones, orbital rims, maxillary antral walls, nasal bones, nasal septum, nasal spine, maxilla, pterygoid plates, zygomatic arches, temporomandibular joints, and mandibles appear intact. Prior plate and screw fixation of the anterior left maxillary wall and left orbit is noted. There is mild residual depression of the anterior left maxillary wall. No displaced fractures are identified. Visualized thyroid cartilage and hyoid bone appear intact. IMPRESSION: No acute intracranial abnormality. No evidence of acute facial fractures. Prior plate and screw fixation of the left anterior maxillary wall and left orbit, with mild residual depression of the left anterior maxillary wall. Electronically Signed   By: Ted Mcalpine M.D.   On: 01/03/2016 12:45   US Renal  01/04/2016  CLINICAL DATA:  Urinary retention. EXAM: RENAL / URINARY TRACT ULTRASOUND  COMPLETE COMPARISON:  CT abdomen and pelvis 01/03/2016 FINDINGS: Right Kidney: Length: 12.2 cm. Echogenicity within normal limits. No mass or hydronephrosis visualized. Left Kidney: Length: 12.2 cm. Echogenicity within normal limits. No mass or hydronephrosis visualized. Bladder: Bladder is decompressed, resulting in limited evaluation. IMPRESSION: Normal ultrasound appearance of the kidneys. Bladder is decompressed. Electronically Signed   By: Burman Nieves M.D.   On: 01/04/2016 02:06   Dg Tibia/fibula Left Port  01/03/2016  CLINICAL DATA:  Nontraumatic compartment syndrome of the leg. EXAM: PORTABLE LEFT TIBIA AND FIBULA - 2 VIEW COMPARISON:  MRI of the lower extremity 01/26/2015 FINDINGS: There is no evidence of fracture or subluxation. Soft tissues of the left leg adjacent to the mid tibia and fibula are demonstrate  heterogeneous appearance with pockets of soft tissue emphysema. Wound VACs are seen medially and laterally. IMPRESSION: No evidence of fracture. Soft tissue swelling and emphysema at the level of the left mid tibia and fibula. This may be due to postprocedural changes, however necrotizing soft tissue infection cannot be excluded. These results will be called to the ordering clinician or representative by the Radiologist Assistant, and communication documented in the PACS or zVision Dashboard. Electronically Signed   By: Ted Mcalpine M.D.   On: 01/03/2016 20:35   Dg Tibia/fibula Right Port  01/03/2016  CLINICAL DATA:  Non traumatic compartment syndrome of the leg. EXAM: PORTABLE RIGHT TIBIA AND FIBULA - 2 VIEW COMPARISON:  None. FINDINGS: No fractures involving the right tibia or fibula. The knee and ankle are located. Soft tissue changes in the lower leg compatible with fasciotomies and placement of a wound VAC. IMPRESSION: No acute bone abnormality in the right lower leg. Surgical changes as described. Electronically Signed   By: Richarda Overlie M.D.   On: 01/03/2016 20:32   Ct Maxillofacial Wo Cm  01/03/2016  CLINICAL DATA:  Status post fall with bruising of the right face and difficulty opening patient's mouth. EXAM: CT HEAD WITHOUT CONTRAST CT MAXILLOFACIAL WITHOUT CONTRAST TECHNIQUE: Multidetector CT imaging of the head and maxillofacial structures were performed using the standard protocol without intravenous contrast. Multiplanar CT image reconstructions of the maxillofacial structures were also generated. COMPARISON:  CT of the head 08/04/2013 FINDINGS: CT HEAD FINDINGS No mass effect or midline shift. No evidence of acute intracranial hemorrhage, or infarction. No abnormal extra-axial fluid collections. Gray-white matter differentiation is normal. Basal cisterns are preserved. No depressed skull fractures. CT MAXILLOFACIAL FINDINGS The globes and extraocular muscles appear symmetrical. No air fluid  levels in the paranasal sinuses. The frontal bones, orbital rims, maxillary antral walls, nasal bones, nasal septum, nasal spine, maxilla, pterygoid plates, zygomatic arches, temporomandibular joints, and mandibles appear intact. Prior plate and screw fixation of the anterior left maxillary wall and left orbit is noted. There is mild residual depression of the anterior left maxillary wall. No displaced fractures are identified. Visualized thyroid cartilage and hyoid bone appear intact. IMPRESSION: No acute intracranial abnormality. No evidence of acute facial fractures. Prior plate and screw fixation of the left anterior maxillary wall and left orbit, with mild residual depression of the left anterior maxillary wall. Electronically Signed   By: Ted Mcalpine M.D.   On: 01/03/2016 12:45   Medications: Infusions: . sodium chloride Stopped (01/05/16 0946)  .  sodium bicarbonate  infusion 1000 mL 100 mL/hr at 01/05/16 1002    Scheduled Medications: . sodium chloride   Intravenous Once  . [MAR Hold] amitriptyline  50 mg Oral QHS  . [MAR Hold] amLODipine  10 mg Oral Daily  .  ceFAZolin (ANCEF) IV  2 g Intravenous To SS-Surg  . [MAR Hold] Chlorhexidine Gluconate Cloth  6 each Topical Q0600  . [MAR Hold] citalopram  40 mg Oral Daily  . [MAR Hold] feeding supplement (ENSURE ENLIVE)  237 mL Oral TID BM  . [MAR Hold] multivitamin with minerals  1 tablet Oral Daily  . [MAR Hold] mupirocin ointment  1 application Nasal BID  . [MAR Hold] pantoprazole  40 mg Oral Daily  . [MAR Hold] risperiDONE  1 mg Oral QHS    have reviewed scheduled and prn medications.  Physical Exam: General: anxious Heart: RRR Lungs: mostly clear Abdomen: soft, non tender Extremities: s/p fasciotomies- open wounds- no edema    01/05/2016,10:44 AM  LOS: 2 days
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
## 2016-01-06 ENCOUNTER — Inpatient Hospital Stay (HOSPITAL_COMMUNITY): Payer: BLUE CROSS/BLUE SHIELD

## 2016-01-06 DIAGNOSIS — R339 Retention of urine, unspecified: Secondary | ICD-10-CM | POA: Insufficient documentation

## 2016-01-06 DIAGNOSIS — N179 Acute kidney failure, unspecified: Secondary | ICD-10-CM

## 2016-01-06 LAB — CK: Total CK: 26520 U/L — ABNORMAL HIGH (ref 49–397)

## 2016-01-06 LAB — CBC
HEMATOCRIT: 22.6 % — AB (ref 39.0–52.0)
HEMOGLOBIN: 7.8 g/dL — AB (ref 13.0–17.0)
MCH: 27.3 pg (ref 26.0–34.0)
MCHC: 34.5 g/dL (ref 30.0–36.0)
MCV: 79 fL (ref 78.0–100.0)
PLATELETS: 186 10*3/uL (ref 150–400)
RBC: 2.86 MIL/uL — AB (ref 4.22–5.81)
RDW: 13 % (ref 11.5–15.5)
WBC: 10 10*3/uL (ref 4.0–10.5)

## 2016-01-06 LAB — RENAL FUNCTION PANEL
ANION GAP: 15 (ref 5–15)
Albumin: 1.3 g/dL — ABNORMAL LOW (ref 3.5–5.0)
BUN: 101 mg/dL — ABNORMAL HIGH (ref 6–20)
CHLORIDE: 88 mmol/L — AB (ref 101–111)
CO2: 22 mmol/L (ref 22–32)
Calcium: 6.1 mg/dL — CL (ref 8.9–10.3)
Creatinine, Ser: 9.34 mg/dL — ABNORMAL HIGH (ref 0.61–1.24)
GFR calc non Af Amer: 6 mL/min — ABNORMAL LOW (ref >60)
GFR, EST AFRICAN AMERICAN: 6 mL/min — AB (ref >60)
Glucose, Bld: 134 mg/dL — ABNORMAL HIGH (ref 65–99)
POTASSIUM: 4.2 mmol/L (ref 3.5–5.1)
Phosphorus: 10.6 mg/dL — ABNORMAL HIGH (ref 2.5–4.6)
Sodium: 125 mmol/L — ABNORMAL LOW (ref 135–145)

## 2016-01-06 LAB — HEPARIN LEVEL (UNFRACTIONATED)

## 2016-01-06 MED ORDER — LIDOCAINE HCL (PF) 1 % IJ SOLN
INTRAMUSCULAR | Status: AC
  Administered 2016-01-06: 10 mL
  Filled 2016-01-06: qty 30

## 2016-01-06 MED ORDER — HEPARIN (PORCINE) IN NACL 100-0.45 UNIT/ML-% IJ SOLN
1950.0000 [IU]/h | INTRAMUSCULAR | Status: DC
  Administered 2016-01-07: 1950 [IU]/h via INTRAVENOUS
  Filled 2016-01-06: qty 250

## 2016-01-06 MED ORDER — HEPARIN SODIUM (PORCINE) 1000 UNIT/ML DIALYSIS
1000.0000 [IU] | INTRAMUSCULAR | Status: DC | PRN
  Administered 2016-01-06 (×2): 1000 [IU] via INTRAVENOUS_CENTRAL
  Filled 2016-01-06: qty 1

## 2016-01-06 NOTE — Procedures (Signed)
Central Venous Catheter Insertion Procedure Note Jeffery Gross [id number] Nov 28, 1960  Procedure: Insertion of Central Venous Catheter / Dialysis Catheter Indications: dialysis  Procedure Details Consent: Risks of procedure as well as the alternatives and risks of each were explained to the (patient/caregiver).  Consent for procedure obtained. Time Out: Verified patient identification, verified procedure, site/side was marked, verified correct patient position, special equipment/implants available, medications/allergies/relevent history reviewed, required imaging and test results available.  Performed  Maximum sterile technique was used including antiseptics, cap, gloves, gown, hand hygiene, mask and sheet. Skin prep: Chlorhexidine; local anesthetic administered A antimicrobial bonded/coated 20cm triple lumen (dual with pigtail) dialysis catheter was placed in the left internal jugular vein using the Seldinger technique.  After local anesthesia, the finder needle was advanced into the vessel under ultrasound guidance. Dark red non-pulsatile blood was aspirated. The wire was threaded into the vessel and needle removed. The wire was confirmed in the vein with ultrasound. A nick was made in the skin and the tract was dilated x 2. The catheter was advanced over the wire into the vessel. The wire was removed. All ports aspirated and flushed easily. The catheter was sutured in place and biopatch and dressing were applied. No complications. Post procedure CXR is pending. EBL <5cc.    Evaluation Blood flow good Complications: No apparent complications Patient did tolerate procedure well. Chest X-ray ordered to verify placement.  CXR: pending.  Jeffery SickleSarah Gross E. Cuong Moorman, MD Pulmonary and Critical Care 01/06/2016 8:16 PM

## 2016-01-06 NOTE — Progress Notes (Signed)
Critical Calcium 6.1, albumin 1.3 and hgb 7.8 paged to Dr. Nancy MarusMayo with confirmation call returned. No new orders received at this time.

## 2016-01-06 NOTE — Progress Notes (Signed)
ANTICOAGULATION CONSULT NOTE - Follow Up Consult  Pharmacy Consult for Heparin Indication: Bilateral DVTs  Allergies  Allergen Reactions  . Sulfacetamide Sodium     edema    Patient Measurements: Height: 6\' 1"  (185.4 cm) Weight: 230 lb 13.2 oz (104.7 kg) IBW/kg (Calculated) : 79.9 Heparin Dosing Weight: 99 kg  Vital Signs: Temp: 98.3 F (36.8 C) (06/18 1619) Temp Source: Oral (06/18 1619) BP: 147/67 mmHg (06/18 1723) Pulse Rate: 81 (06/18 1129)  Labs:  Recent Labs  01/04/16 0244 01/04/16 0746 01/04/16 1201 01/04/16 1633 01/05/16 0350 01/05/16 0855 01/06/16 0217 01/06/16 1822  HGB 12.2*  --   --   --  10.9* 8.8* 7.8*  --   HCT 36.2*  --   --   --  32.4* 26.0* 22.6*  --   PLT 166  --   --   --  157  --  186  --   LABPROT  --   --  15.4*  --   --   --   --   --   INR  --   --  1.20  --   --   --   --   --   HEPARINUNFRC <0.10* 0.15*  --  0.33  --   --   --  <0.10*  CREATININE 6.44*  --   --   --  8.64*  --  9.34*  --   CKTOTAL >50000*  --   --   --  [id number]*  --  26520*  --     Estimated Creatinine Clearance: 11.5 mL/min (by C-G formula based on Cr of 9.34).   Assessment: 55 y/o male with B/L lower limb pain for 2 days after he sustained a fall. Pain too severe to walk for last 48h. Reports dark colored urine. Patient is found to have Rhabdo, AKI, hyponatremia, hyperkalemia, +DVT. Ortho was consulted for BLE compartment syndrome. 6/15 he was taken emergently to OR for bilateral lower leg four-compartment fasciotomy. The patient is s/p repeat fasciotomy I&D earlier today with application of wound vac.  Pt was restarted on heparin this morning but was held this evening for catheter insertion and hematoma. Heparin level was drawn during this time and is undetectable. Will restart heparin at previous rate at 2300.  Goal of Therapy:  Heparin level 0.3-0.7 units/ml Monitor platelets by anticoagulation protocol: Yes   Plan:  Restart heparin at 1950 units/hr 8h  HL Daily HL/CBC Monitor s/sx of bleeding   Arlean Hoppingorey M. Newman PiesBall, PharmD, BCPS Clinical Pharmacist Pager [phone] 01/06/2016 9:05 PM

## 2016-01-06 NOTE — Progress Notes (Signed)
PT Cancellation Note  Patient Details Name: Rip HarbourScott D Vanallen MRN: [id number] DOB: Apr 09, 1961   Cancelled Treatment:    Reason Eval/Treat Not Completed: Medical issues which prohibited therapy.  Per most recent Pharmacy note, heparin restarted this morning at 10:00 am.  Will await Pharmacy follow up with heparin level before initiating activity as pt w/ DVT.   Encarnacion ChuAshley Nicky Milhouse PT, DPT  Pager: [phone] Phone: [phone] 01/06/2016, 1:01 PM
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
## 2016-01-06 NOTE — Progress Notes (Signed)
Subjective:  In ICU- 7 liters positive - CK is down from 420,000 to 26,000 ?  Still very minimal UOP Objective Vital signs in last 24 hours: Filed Vitals:   01/06/16 0400 01/06/16 0500 01/06/16 0807 01/06/16 0809  BP: 144/72 116/66  128/55  Pulse: 87 89  82  Temp: 98.9 F (37.2 C)  98 F (36.7 C)   TempSrc: Oral  Oral   Resp: 14 14  12   Height:      Weight:    104.7 kg (230 lb 13.2 oz)  SpO2: 97% 96%  98%   Weight change:   Intake/Output Summary (Last 24 hours) at 01/06/16 0830 Last data filed at 01/06/16 0800  Gross per 24 hour  Intake 9813.34 ml  Output   1250 ml  Net 8563.34 ml    Assessment/Plan: 55 year old white male with hypertension and chronic pain who presents with rhabdo after his fall and immobility in the setting of narcotics and Klonopin 1.Renal- AKI most likely due to rhabdomyolysis after fall and staying down. Baseline renal function is not known. Renal ultrasound was unremarkable and urinalysis basically consistent with rhabdo. I agree with aggressive IV hydration at this time including some fluids with sodium bicarbonate. Some volume overload but on room air so would continue. Does have Gross small amount of urine in his Foley bag. There are no absolute indications for dialysis at this time. I have discussed with the patient that this likely will get worse before it gets better -he will need an intermittent HD treatment tomorrow- I have discussed with his mother and will ask CCM to place line. Continue to check kidney function and CK labs daily 2. Hypertension/volume - blood pressure is OK  for now. IV fluids will likely cause blood pressure go higher. No need to add  agent at this time 3. Anemia - decreasing with volume   Jeffery Gross    Labs: Basic Metabolic Panel:  Recent Labs Lab 01/04/16 0244 01/05/16 0350 01/05/16 0855 01/06/16 0217  NA 125* 125* 124* 125*  K 4.6 4.5 4.4 4.2  CL 95* 90*  --  88*  CO2 15* 20*  --  22  GLUCOSE 110* 116*   --  134*  BUN 76* 97*  --  101*  CREATININE 6.44* 8.64*  --  9.34*  CALCIUM 6.8* 6.7*  --  6.1*  PHOS  --  10.7*  --  10.6*   Liver Function Tests:  Recent Labs Lab 01/04/16 0244 01/05/16 0350 01/06/16 0217  AST 1428*  --   --   ALT 624*  --   --   ALKPHOS 55  --   --   BILITOT 0.4  --   --   PROT 4.8*  --   --   ALBUMIN 2.1* 1.7* 1.3*   No results for input(s): LIPASE, AMYLASE in the last 168 hours. No results for input(s): AMMONIA in the last 168 hours. CBC:  Recent Labs Lab 01/03/16 1142  01/04/16 0244 01/05/16 0350 01/05/16 0855 01/06/16 0217  WBC 20.0*  --  13.1* 9.3  --  10.0  NEUTROABS 16.7*  --   --   --   --   --   HGB 16.6  < > 12.2* 10.9* 8.8* 7.8*  HCT 46.7  < > 36.2* 32.4* 26.0* 22.6*  MCV 78.9  --  80.8 78.5  --  79.0  PLT 197  --  166 157  --  186  < > = values in this interval  not displayed. Cardiac Enzymes:  Recent Labs Lab 01/03/16 1142 01/04/16 0244 01/05/16 0350 01/06/16 0217  CKTOTAL >50000* >50000* 420003* 26520*   CBG:  Recent Labs Lab 01/03/16 1706  GLUCAP 98    Iron Studies: No results for input(s): IRON, TIBC, TRANSFERRIN, FERRITIN in the last 72 hours. Studies/Results: No results found. Medications: Infusions: . sodium chloride 150 mL/hr at 01/05/16 2248  . heparin    .  sodium bicarbonate  infusion 1000 mL 150 mL/hr at 01/06/16 0811    Scheduled Medications: . sodium chloride   Intravenous Once  . amitriptyline  50 mg Oral QHS  . amLODipine  10 mg Oral Daily  . Chlorhexidine Gluconate Cloth  6 each Topical Q0600  . citalopram  40 mg Oral Daily  . feeding supplement (ENSURE ENLIVE)  237 mL Oral TID BM  . multivitamin with minerals  1 tablet Oral Daily  . mupirocin ointment  1 application Nasal BID  . pantoprazole  40 mg Oral Daily  . risperiDONE  1 mg Oral QHS    have reviewed scheduled and prn medications.  Physical Exam: General: more somnolent this AM- will wake but only for Gross few seconds Heart:  RRR Lungs: mostly clear Abdomen: soft, non tender Extremities: s/p fasciotomies- open wounds- min edema    01/06/2016,8:30 AM  LOS: 3 days

## 2016-01-07 ENCOUNTER — Inpatient Hospital Stay (HOSPITAL_COMMUNITY): Payer: BLUE CROSS/BLUE SHIELD | Admitting: Certified Registered Nurse Anesthetist

## 2016-01-07 ENCOUNTER — Encounter (HOSPITAL_COMMUNITY)
Admission: EM | Disposition: A | Discharge status: Rehabilitation Facility | Payer: Self-pay | Source: Home / Self Care | Attending: Family Medicine

## 2016-01-07 ENCOUNTER — Encounter (HOSPITAL_COMMUNITY): Payer: Self-pay | Admitting: Orthopaedic Surgery

## 2016-01-07 HISTORY — PX: I & D EXTREMITY: SHX5045

## 2016-01-07 LAB — RENAL FUNCTION PANEL
ALBUMIN: 1.1 g/dL — AB (ref 3.5–5.0)
Anion gap: 14 (ref 5–15)
BUN: 108 mg/dL — AB (ref 6–20)
CO2: 25 mmol/L (ref 22–32)
CREATININE: 9.98 mg/dL — AB (ref 0.61–1.24)
Calcium: 6 mg/dL — CL (ref 8.9–10.3)
Chloride: 88 mmol/L — ABNORMAL LOW (ref 101–111)
GFR calc Af Amer: 6 mL/min — ABNORMAL LOW (ref >60)
GFR, EST NON AFRICAN AMERICAN: 5 mL/min — AB (ref >60)
Glucose, Bld: 136 mg/dL — ABNORMAL HIGH (ref 65–99)
PHOSPHORUS: 10.2 mg/dL — AB (ref 2.5–4.6)
Potassium: 4.3 mmol/L (ref 3.5–5.1)
Sodium: 127 mmol/L — ABNORMAL LOW (ref 135–145)

## 2016-01-07 LAB — CBC
HCT: 20.1 % — ABNORMAL LOW (ref 39.0–52.0)
Hemoglobin: 7 g/dL — ABNORMAL LOW (ref 13.0–17.0)
MCH: 27.1 pg (ref 26.0–34.0)
MCHC: 34.8 g/dL (ref 30.0–36.0)
MCV: 77.9 fL — ABNORMAL LOW (ref 78.0–100.0)
PLATELETS: 199 10*3/uL (ref 150–400)
RBC: 2.58 MIL/uL — ABNORMAL LOW (ref 4.22–5.81)
RDW: 13 % (ref 11.5–15.5)
WBC: 9.7 10*3/uL (ref 4.0–10.5)

## 2016-01-07 LAB — HEMOGLOBIN AND HEMATOCRIT, BLOOD
HCT: 24.8 % — ABNORMAL LOW (ref 39.0–52.0)
Hemoglobin: 8.4 g/dL — ABNORMAL LOW (ref 13.0–17.0)

## 2016-01-07 LAB — PREPARE RBC (CROSSMATCH)

## 2016-01-07 LAB — CK: CK TOTAL: 15910 U/L — AB (ref 49–397)

## 2016-01-07 LAB — MAGNESIUM: Magnesium: 2.3 mg/dL (ref 1.7–2.4)

## 2016-01-07 SURGERY — IRRIGATION AND DEBRIDEMENT EXTREMITY
Anesthesia: General | Site: Leg Lower | Laterality: Left

## 2016-01-07 MED ORDER — SODIUM CHLORIDE 0.9 % IR SOLN
Status: DC | PRN
  Administered 2016-01-07: 3000 mL

## 2016-01-07 MED ORDER — FENTANYL CITRATE (PF) 100 MCG/2ML IJ SOLN
INTRAMUSCULAR | Status: AC
  Administered 2016-01-07: 10:00:00
  Filled 2016-01-07: qty 2

## 2016-01-07 MED ORDER — ROCURONIUM BROMIDE 50 MG/5ML IV SOLN
INTRAVENOUS | Status: AC
  Filled 2016-01-07: qty 1

## 2016-01-07 MED ORDER — PHENYLEPHRINE 40 MCG/ML (10ML) SYRINGE FOR IV PUSH (FOR BLOOD PRESSURE SUPPORT)
PREFILLED_SYRINGE | INTRAVENOUS | Status: AC
  Filled 2016-01-07: qty 10

## 2016-01-07 MED ORDER — PENTAFLUOROPROP-TETRAFLUOROETH EX AERO: 1.0000 "application " | INHALATION_SPRAY | CUTANEOUS | Status: DC | PRN

## 2016-01-07 MED ORDER — SODIUM CHLORIDE 0.9 % IV SOLN
Freq: Once | INTRAVENOUS | Status: AC
  Administered 2016-01-07: 14:00:00 via INTRAVENOUS

## 2016-01-07 MED ORDER — MEPERIDINE HCL 25 MG/ML IJ SOLN: 6.2500 mg | INTRAMUSCULAR | Status: DC | PRN

## 2016-01-07 MED ORDER — FENTANYL CITRATE (PF) 100 MCG/2ML IJ SOLN
75.0000 ug | INTRAMUSCULAR | Status: DC | PRN
Start: 2016-01-07 — End: 2016-01-10
  Administered 2016-01-07 – 2016-01-09 (×10): 75 ug via INTRAVENOUS
  Administered 2016-01-09: 50 ug via INTRAVENOUS
  Administered 2016-01-09: 25 ug via INTRAVENOUS
  Administered 2016-01-09: 75 ug via INTRAVENOUS
  Administered 2016-01-09 (×2): 50 ug via INTRAVENOUS
  Administered 2016-01-10 (×2): 75 ug via INTRAVENOUS
  Filled 2016-01-07 (×12): qty 2

## 2016-01-07 MED ORDER — SODIUM CHLORIDE 0.9 % IV SOLN: 100.0000 mL | INTRAVENOUS | Status: DC | PRN

## 2016-01-07 MED ORDER — PROPOFOL 10 MG/ML IV BOLUS
INTRAVENOUS | Status: AC
  Filled 2016-01-07: qty 20

## 2016-01-07 MED ORDER — FENTANYL CITRATE (PF) 250 MCG/5ML IJ SOLN
INTRAMUSCULAR | Status: AC
  Filled 2016-01-07: qty 5

## 2016-01-07 MED ORDER — ETOMIDATE 2 MG/ML IV SOLN
INTRAVENOUS | Status: DC | PRN
  Administered 2016-01-07: 14 mg via INTRAVENOUS

## 2016-01-07 MED ORDER — MIDAZOLAM HCL 2 MG/2ML IJ SOLN
INTRAMUSCULAR | Status: AC
  Filled 2016-01-07: qty 2

## 2016-01-07 MED ORDER — HYDROMORPHONE HCL 1 MG/ML IJ SOLN
INTRAMUSCULAR | Status: AC
  Filled 2016-01-07: qty 1

## 2016-01-07 MED ORDER — SUCCINYLCHOLINE CHLORIDE 20 MG/ML IJ SOLN
INTRAMUSCULAR | Status: DC | PRN
  Administered 2016-01-07: 120 mg via INTRAVENOUS

## 2016-01-07 MED ORDER — CEFAZOLIN SODIUM-DEXTROSE 2-4 GM/100ML-% IV SOLN
INTRAVENOUS | Status: AC
  Filled 2016-01-07: qty 100

## 2016-01-07 MED ORDER — HYDROMORPHONE HCL 1 MG/ML IJ SOLN
0.2500 mg | INTRAMUSCULAR | Status: DC | PRN
  Administered 2016-01-07: 0.5 mg via INTRAVENOUS

## 2016-01-07 MED ORDER — ONDANSETRON HCL 4 MG/2ML IJ SOLN
4.0000 mg | Freq: Once | INTRAMUSCULAR | Status: DC | PRN
Start: 2016-01-07 — End: 2016-01-07

## 2016-01-07 MED ORDER — LIDOCAINE-PRILOCAINE 2.5-2.5 % EX CREA: 1.0000 "application " | TOPICAL_CREAM | CUTANEOUS | Status: DC | PRN

## 2016-01-07 MED ORDER — HEPARIN (PORCINE) IN NACL 100-0.45 UNIT/ML-% IJ SOLN
2500.0000 [IU]/h | INTRAMUSCULAR | Status: DC
  Administered 2016-01-08: 2200 [IU]/h via INTRAVENOUS
  Administered 2016-01-08: 1950 [IU]/h via INTRAVENOUS
  Administered 2016-01-09: 2500 [IU]/h via INTRAVENOUS
  Filled 2016-01-07 (×3): qty 250

## 2016-01-07 MED ORDER — DEXTROSE 5 % IV SOLN
10.0000 mg | INTRAVENOUS | Status: DC | PRN
  Administered 2016-01-07: 20 ug/min via INTRAVENOUS

## 2016-01-07 MED ORDER — ONDANSETRON HCL 4 MG/2ML IJ SOLN
INTRAMUSCULAR | Status: AC
  Filled 2016-01-07: qty 2

## 2016-01-07 MED ORDER — EPINEPHRINE HCL 1 MG/ML IJ SOLN
INTRAMUSCULAR | Status: AC
  Filled 2016-01-07: qty 1

## 2016-01-07 MED ORDER — FENTANYL CITRATE (PF) 250 MCG/5ML IJ SOLN
INTRAMUSCULAR | Status: DC | PRN
  Administered 2016-01-07: 150 ug via INTRAVENOUS

## 2016-01-07 MED ORDER — LIDOCAINE HCL (PF) 1 % IJ SOLN: 5.0000 mL | INTRAMUSCULAR | Status: DC | PRN

## 2016-01-07 MED ORDER — LACTATED RINGERS IV SOLN
INTRAVENOUS | Status: DC | PRN
  Administered 2016-01-07: 18:00:00 via INTRAVENOUS

## 2016-01-07 MED ORDER — ONDANSETRON HCL 4 MG/2ML IJ SOLN
INTRAMUSCULAR | Status: DC | PRN
  Administered 2016-01-07: 4 mg via INTRAVENOUS

## 2016-01-07 MED ORDER — HYDROMORPHONE HCL 1 MG/ML IJ SOLN
INTRAMUSCULAR | Status: AC
  Administered 2016-01-07: 0.5 mg via INTRAVENOUS
  Filled 2016-01-07: qty 1

## 2016-01-07 MED ORDER — ALTEPLASE 2 MG IJ SOLR: 2.0000 mg | Freq: Once | INTRAMUSCULAR | Status: DC | PRN

## 2016-01-07 MED ORDER — MINERAL OIL LIGHT 100 % EX OIL
TOPICAL_OIL | CUTANEOUS | Status: AC
  Filled 2016-01-07: qty 25

## 2016-01-07 MED ORDER — LIDOCAINE HCL (CARDIAC) 20 MG/ML IV SOLN
INTRAVENOUS | Status: DC | PRN
  Administered 2016-01-07: 100 mg via INTRAVENOUS

## 2016-01-07 SURGICAL SUPPLY — 47 items
BANDAGE ELASTIC 6 VELCRO ST LF (GAUZE/BANDAGES/DRESSINGS) IMPLANT
BLADE DERMATOME SS (BLADE) ×1 IMPLANT
BLADE SURG ROTATE 9660 (MISCELLANEOUS) IMPLANT
BNDG GAUZE ELAST 4 BULKY (GAUZE/BANDAGES/DRESSINGS) IMPLANT
CANISTER SUCTION 2500CC (MISCELLANEOUS) IMPLANT
CANISTER WOUND CARE 500ML ATS (WOUND CARE) ×2 IMPLANT
CONNECTOR Y ATS VAC SYSTEM (MISCELLANEOUS) ×2 IMPLANT
COVER SURGICAL LIGHT HANDLE (MISCELLANEOUS) ×3 IMPLANT
DERMACARRIERS GRAFT 1 TO 1.5 (DISPOSABLE)
DRAPE INCISE IOBAN 66X45 STRL (DRAPES) ×4 IMPLANT
DRESSING TELFA 8X3 (GAUZE/BANDAGES/DRESSINGS) ×1 IMPLANT
DRSG ADAPTIC 3X8 NADH LF (GAUZE/BANDAGES/DRESSINGS) IMPLANT
DRSG PAD ABDOMINAL 8X10 ST (GAUZE/BANDAGES/DRESSINGS) IMPLANT
DRSG VAC ATS LRG SENSATRAC (GAUZE/BANDAGES/DRESSINGS) ×2 IMPLANT
ELECT CAUTERY BLADE 6.4 (BLADE) ×1 IMPLANT
ELECT REM PT RETURN 9FT ADLT (ELECTROSURGICAL) ×3
ELECTRODE REM PT RTRN 9FT ADLT (ELECTROSURGICAL) IMPLANT
FACESHIELD WRAPAROUND (MASK) ×3 IMPLANT
FACESHIELD WRAPAROUND OR TEAM (MASK) IMPLANT
GAUZE SPONGE 4X4 12PLY STRL (GAUZE/BANDAGES/DRESSINGS) IMPLANT
GAUZE XEROFORM 5X9 LF (GAUZE/BANDAGES/DRESSINGS) IMPLANT
GLOVE BIO SURGEON STRL SZ8 (GLOVE) ×2 IMPLANT
GLOVE ORTHO TXT STRL SZ7.5 (GLOVE) ×3 IMPLANT
GOWN STRL REIN XL XLG (GOWN DISPOSABLE) ×4 IMPLANT
GRAFT DERMACARRIERS 1 TO 1.5 (DISPOSABLE) IMPLANT
HANDPIECE INTERPULSE COAX TIP (DISPOSABLE)
KIT BASIN OR (CUSTOM PROCEDURE TRAY) ×3 IMPLANT
KIT ROOM TURNOVER OR (KITS) ×3 IMPLANT
MANIFOLD NEPTUNE II (INSTRUMENTS) ×2 IMPLANT
NS IRRIG 1000ML POUR BTL (IV SOLUTION) ×1 IMPLANT
PACK ORTHO EXTREMITY (CUSTOM PROCEDURE TRAY) ×3 IMPLANT
PAD ARMBOARD 7.5X6 YLW CONV (MISCELLANEOUS) ×6 IMPLANT
PAD NEG PRESSURE SENSATRAC (MISCELLANEOUS) ×2 IMPLANT
PADDING CAST COTTON 6X4 STRL (CAST SUPPLIES) IMPLANT
SET CYSTO W/LG BORE CLAMP LF (SET/KITS/TRAYS/PACK) IMPLANT
SET HNDPC FAN SPRY TIP SCT (DISPOSABLE) IMPLANT
SPONGE LAP 18X18 X RAY DECT (DISPOSABLE) ×5 IMPLANT
STAPLER VISISTAT 35W (STAPLE) IMPLANT
SUT MNCRL AB 4-0 PS2 18 (SUTURE) ×1 IMPLANT
TOWEL OR 17X24 6PK STRL BLUE (TOWEL DISPOSABLE) ×3 IMPLANT
TOWEL OR 17X26 10 PK STRL BLUE (TOWEL DISPOSABLE) ×3 IMPLANT
TUBE CONNECTING 12'X1/4 (SUCTIONS)
TUBE CONNECTING 12X1/4 (SUCTIONS) IMPLANT
TUBING CYSTO DISP (UROLOGICAL SUPPLIES) ×3 IMPLANT
UNDERPAD 30X30 INCONTINENT (UNDERPADS AND DIAPERS) ×3 IMPLANT
WATER STERILE IRR 1000ML POUR (IV SOLUTION) ×1 IMPLANT
YANKAUER SUCT BULB TIP NO VENT (SUCTIONS) IMPLANT

## 2016-01-07 NOTE — Anesthesia Postprocedure Evaluation (Signed)
Anesthesia Post Note  Patient: Jeffery Gross  Procedure(s) Performed: Procedure(s) (LRB): Irrigation and Debridement of Left Lower Extremity with Application of Wound Vac (Left)  Patient location during evaluation: PACU Anesthesia Type: General Level of consciousness: awake and alert Pain management: pain level controlled Vital Signs Assessment: post-procedure vital signs reviewed and stable Respiratory status: spontaneous breathing, nonlabored ventilation, respiratory function stable and patient connected to nasal cannula oxygen Cardiovascular status: blood pressure returned to baseline and stable Postop Assessment: no signs of nausea or vomiting Anesthetic complications: no    Last Vitals:  Filed Vitals:   01/07/16 1930 01/07/16 2015  BP: 124/66 136/61  Pulse: 91 95  Temp: 36.6 C   Resp: 12 14    Last Pain:  Filed Vitals:   01/07/16 2032  PainSc: 9                  Kennieth RadFitzgerald, Devory Mckinzie E

## 2016-01-07 NOTE — Consult Note (Signed)
CH spoke with RN regarding paperwork for HCPOA and to have it given to pt who was unavailable.  When completed by pt, please contact CH. Erline LevineMichael I Glendine Swetz 4:50 PM

## 2016-01-07 NOTE — Transfer of Care (Signed)
Immediate Anesthesia Transfer of Care Note  Patient: Jeffery HarbourScott D Alewine  Procedure(s) Performed: Procedure(s): IRRIGATION AND DEBRIDEMENT EXTREMITY POSSIBLE STSG (Left)  Patient Location: PACU  Anesthesia Type:General  Level of Consciousness: awake  Airway & Oxygen Therapy: Patient Spontanous Breathing and Patient connected to face mask oxygen  Post-op Assessment: Report given to RN, Post -op Vital signs reviewed and stable and Patient moving all extremities X 4  Post vital signs: Reviewed and stable  Last Vitals:  Filed Vitals:   01/07/16 1605 01/07/16 1700  BP: 143/71   Pulse: 96 90  Temp: 37.2 C   Resp: 17 14    Last Pain:  Filed Vitals:   01/07/16 1841  PainSc: 4       Patients Stated Pain Goal: 2 (01/07/16 0945)  Complications: No apparent anesthesia complications

## 2016-01-07 NOTE — Progress Notes (Addendum)
   Subjective:  Patient reports pain as mild.  No events. Got dialysis catheter yesterday.  Objective:   VITALS:   Filed Vitals:   01/06/16 2030 01/06/16 2341 01/07/16 0400 01/07/16 0600  BP: 122/63 122/73 128/61 133/73  Pulse:   90 94  Temp: 98.9 F (37.2 C) 98.8 F (37.1 C)    TempSrc: Oral Oral    Resp: 21  15 15   Height:      Weight:      SpO2: 95% 93% 96% 95%    BLE exam stable with VACs   Lab Results  Component Value Date   WBC 9.7 01/07/2016   HGB 7.0* 01/07/2016   HCT 20.1* 01/07/2016   MCV 77.9* 01/07/2016   PLT 199 01/07/2016     Assessment/Plan:      - Expected postop acute blood loss anemia - will monitor for symptoms - heparin drip for BLE DVTs - heparin drip to be turned off at 8 am - to dialysis today - NPO - to OR today for LLE I&D and skin grafting  - I have discussed with patient regarding need for right AKA.  Patient is aware.  I will have my partner Dr. Lajoyce Cornersuda perform right AKA on Wednesday.    Cheral AlmasXu, Tyyonna Soucy Michael 01/07/2016, 7:47 AM [phone]

## 2016-01-07 NOTE — Progress Notes (Signed)
OT Cancellation Note  Patient Details Name: Jeffery Gross MRN: [id number] DOB: 13-Oct-1960   Cancelled Treatment:    Reason Eval/Treat Not Completed: Patient not medically ready - Pt in HD this am.  To OR tomorrow for I&D Lt LE and possibly AKA Rt LE Wed.  Please reorder OT once pt medically appropriate.  Thanks!  Jeffery Gross, OTR/L [phone]   Jeffery Gross, Jeffery Gross 01/07/2016, 11:09 AM

## 2016-01-07 NOTE — Progress Notes (Signed)
PT Cancellation Note  Patient Details Name: Jeffery Gross MRN: [id number] DOB: [birth year]   Cancelled Treatment:    Reason Eval/Treat Not Completed: Patient not medically ready. Noted plans for OR today (including skin graft) and OR Wednesday for AKA. Please re-order PT when medically appropriate. Thank you.   Aleaha Fickling 01/07/2016, 8:08 AM  Pager [phone]

## 2016-01-07 NOTE — Progress Notes (Signed)
Pt transferred to HD at 0745 this morning.  Emotional support given to pt.

## 2016-01-07 NOTE — Progress Notes (Signed)
Pt called out to this RN, complaint of scrotal edema, no complaints of pain at this time. Pt bladder scan shows 0 cc urine; MD called to bedside to evaluate pt. Verbal order given to cut continuous NS rate down to 50 cc/hr. Will continue to monitor closely.

## 2016-01-07 NOTE — Progress Notes (Signed)
   01/07/16 1200  Clinical Encounter Type  Visited With Patient  Visit Type Initial  Referral From Family  Consult/Referral To Nurse  Chaplain visited patient in HD; too recently medicated to be able to converse. Will return this afternoon. Kittie Krizan, Chaplain

## 2016-01-07 NOTE — Anesthesia Procedure Notes (Signed)
Procedure Name: Intubation Date/Time: 01/07/2016 5:54 PM Performed by: Little IshikawaMERCER, Raidon Swanner L Pre-anesthesia Checklist: Patient identified, Emergency Drugs available, Suction available, Patient being monitored and Timeout performed Patient Re-evaluated:Patient Re-evaluated prior to inductionOxygen Delivery Method: Circle system utilized Preoxygenation: Pre-oxygenation with 100% oxygen Intubation Type: IV induction Ventilation: Mask ventilation without difficulty Laryngoscope Size: Glidescope and 4 Grade View: Grade I Tube type: Oral Tube size: 7.5 mm Airway Equipment and Method: Rigid stylet Placement Confirmation: ETT inserted through vocal cords under direct vision,  positive ETCO2 and breath sounds checked- equal and bilateral Secured at: 22 cm Tube secured with: Tape Dental Injury: Teeth and Oropharynx as per pre-operative assessment  Difficulty Due To: Difficulty was anticipated and Difficult Airway- due to limited oral opening

## 2016-01-07 NOTE — Progress Notes (Signed)
Was called to patient room by nurse to evaluate scrotal swelling. Scrotum is swollen to about the size of a grapefruit. On exam he has a soft edematous scrotum without excessive warmth or erythema. Not painful. Foley catheter in place.   From reviewing chart patient is up 30lbs since admission. Also noted to have positvie net balance of +18L. Likely source of scrotol edema is anasarca and fluid overload.   Will monitor. Fluids decreased as patient tolerating PO. HD to help with removing fluid since patient is anuric. Can hopefully start to decrease IVFs as rhabdo resolves.   Caryl AdaJazma Malyia Moro, DO 01/07/2016, 11:09 PM PGY-2, [facility]

## 2016-01-07 NOTE — Anesthesia Preprocedure Evaluation (Addendum)
Anesthesia Evaluation  Patient identified by MRN, date of birth, ID band Patient awake    Reviewed: Allergy & Precautions, NPO status , Patient's Chart, lab work & pertinent test results  History of Anesthesia Complications Negative for: history of anesthetic complications  Airway Mallampati: IV  TM Distance: >3 FB Neck ROM: Full  Mouth opening: Limited Mouth Opening  Dental  (+) Teeth Intact   Pulmonary Current Smoker,    Pulmonary exam normal breath sounds clear to auscultation   rales    Cardiovascular hypertension, Pt. on medications (-) angina(-) Past MI and (-) CHF Normal cardiovascular exam Rhythm:Regular     Neuro/Psych PSYCHIATRIC DISORDERS  Neuromuscular disease    GI/Hepatic negative GI ROS,   Endo/Other    Renal/GU ARFRenal disease     Musculoskeletal  (+) Arthritis ,   Abdominal   Peds  Hematology  (+) anemia ,   Anesthesia Other Findings Fluids at current rate; transfuse PRBC cc for cc May risk pulm edema with resuscitation ; will intubate this time Limited mouth opening noted; dry membranes. Easy LMA prior   Reproductive/Obstetrics                          Anesthesia Physical  Anesthesia Plan  ASA: III  Anesthesia Plan: General   Post-op Pain Management:    Induction: Intravenous  Airway Management Planned: Oral ETT and Video Laryngoscope Planned  Additional Equipment: None  Intra-op Plan:   Post-operative Plan: Extubation in OR  Informed Consent: I have reviewed the patients History and Physical, chart, labs and discussed the procedure including the risks, benefits and alternatives for the proposed anesthesia with the patient or authorized representative who has indicated his/her understanding and acceptance.   Dental Advisory Given and Dental advisory given  Plan Discussed with: CRNA and Surgeon  Anesthesia Plan Comments: (Discussed GA with LMA, possible  sore throat, potential need to switch to ETT, N/V, pulmonary aspiration. Questions answered. )      Anesthesia Quick Evaluation

## 2016-01-07 NOTE — Op Note (Signed)
   Date of Surgery: 01/07/2016  INDICATIONS: [name] is a 55 y.o.-year-old male with a bilateral lower leg compartment syndrome s/p 4 compartment fasciotomies;  The patient did consent to the procedure after discussion of the risks and benefits.  PREOPERATIVE DIAGNOSIS: left lower leg fasciotomies  POSTOPERATIVE DIAGNOSIS: Same.  PROCEDURE: 1. Irrigation and debridement of muscle and skin of left lower extremity medial wound 35 cm x 7 cm x 2 cm; lateral wound 15 cm x 5 cm x 1 cm 2. Application of wound VAC greater than 50 cm  SURGEON: N. Glee ArvinMichael Riyad Keena, M.D.  ASSIST: None.  ANESTHESIA:  general  IV FLUIDS AND URINE: See anesthesia.  ESTIMATED BLOOD LOSS: See anesthesia record  IMPLANTS: None  DRAINS: Left lower leg wound VAC  COMPLICATIONS: None.  DESCRIPTION OF PROCEDURE: The patient was brought to the operating room and placed supine on the operating table.  The patient had been signed prior to the procedure and this was documented. The patient had the anesthesia placed by the anesthesiologist.  A time-out was performed to confirm that this was the correct patient, site, side and location. The patient did receive antibiotics prior to the incision and was re-dosed during the procedure as needed at indicated intervals.  A tourniquet was placed.  The patient had the operative extremities prepped and draped in the standard surgical fashion.    The anterior and lateral compartments of the lower leg were overall healthy and viable. The muscle had a healthy appearance. The muscle also contracted with Bovie stimulation. Superficial debridement of the muscle was performed back to a bleeding surface. I then inspected the posterior compartments and had evidence of nonviable muscle. This was sharply debrided with a rongeur and knife back to bleeding surfaces. Overall the posterior compartments appeared to be viable. No other compartments were able to be closed. After thorough debridement of the  left lower extremity this was thoroughly irrigated with normal saline. A wound VAC was reapplied. Patient tolerated procedure well and no many complications.  POSTOPERATIVE PLAN: The patient will return to the OR on Wednesday for amputation of his right lower leg and skin grafting of his left lower leg with Dr. Lajoyce Cornersuda.  [name], MD [facility] [phone]

## 2016-01-07 NOTE — Procedures (Signed)
Tolerating treatment, leg pains requiring narcotics which resulted in sedation. Jeffery Gross C

## 2016-01-07 NOTE — Progress Notes (Signed)
Dr. Nancy MarusMayo notified of critical Calcium 6.0 with albumin 1.1; hgb 7.0. No new orders at this time.

## 2016-01-07 NOTE — Progress Notes (Signed)
ANTICOAGULATION CONSULT NOTE - Follow Up Consult  Pharmacy Consult for Heparin Indication: Bilateral DVTs  Allergies  Allergen Reactions  . Sulfacetamide Sodium     edema    Patient Measurements: Height: 6\' 1"  (185.4 cm) Weight:  (unable to obtain; bed weight not accurate) IBW/kg (Calculated) : 79.9 Heparin Dosing Weight: 99 kg  Vital Signs: Temp: 97.9 F (36.6 C) (06/19 1930) Temp Source: Oral (06/19 1605) BP: 136/61 mmHg (06/19 2015) Pulse Rate: 95 (06/19 2015)  Labs:  Recent Labs  01/05/16 0350 01/05/16 0855 01/06/16 0217 01/06/16 1822 01/07/16 0445  HGB 10.9* 8.8* 7.8*  --  7.0*  HCT 32.4* 26.0* 22.6*  --  20.1*  PLT 157  --  186  --  199  HEPARINUNFRC  --   --   --  <0.10*  --   CREATININE 8.64*  --  9.34*  --  9.98*  CKTOTAL 420003*  --  [id number]*  --  15910*    Estimated Creatinine Clearance: 10.7 mL/min (by C-G formula based on Cr of 9.98).   Assessment: 55 y/o male with B/L lower limb pain for 2 days after he sustained a fall. Pain too severe to walk for last 48h. Reports dark colored urine. Patient is found to have Rhabdo, AKI, hyponatremia, hyperkalemia, +DVT. Ortho was consulted for BLE compartment syndrome. 6/15 he was taken emergently to OR for bilateral lower leg four-compartment fasciotomy. The patient is s/p repeat fasciotomy I&D earlier today with application of wound vac.  Per Dr. Roda ShuttersXu, restart heparin at 0800 on 6/20. He was previously on 1950 units/hr, with low-end threrapeutic anti-Xa level. Noted hgb has been trending down to 7 this morning.  Goal of Therapy:  Heparin level 0.3-0.7 units/ml Monitor platelets by anticoagulation protocol: Yes   Plan:  - restart IV heparin 1950 units/hr at 0800 on 6/20 - f/u 1600 heparin level - f/u AM cbc, monitor s/sx of bleeding.  Bayard HuggerMei Germany Chelf, PharmD, BCPS  Clinical Pharmacist  Pager: [phone]    01/07/2016 8:36 PM
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
## 2016-01-07 NOTE — Progress Notes (Signed)
ANTICOAGULATION CONSULT NOTE - Follow Up Consult  Pharmacy Consult for Heparin Indication: Bilateral DVTs  Allergies  Allergen Reactions  . Sulfacetamide Sodium     edema    Patient Measurements: Height: 6\' 1"  (185.4 cm) Weight: 230 lb 13.2 oz (104.7 kg) IBW/kg (Calculated) : 79.9 Heparin Dosing Weight: 99 kg  Vital Signs: Temp: 98.8 F (37.1 C) (06/18 2341) Temp Source: Oral (06/18 2341) BP: 133/73 mmHg (06/19 0600) Pulse Rate: 94 (06/19 0600)  Labs:  Recent Labs  01/04/16 1201 01/04/16 1633  01/05/16 0350 01/05/16 0855 01/06/16 0217 01/06/16 1822 01/07/16 0445  HGB  --   --   < > 10.9* 8.8* 7.8*  --  7.0*  HCT  --   --   < > 32.4* 26.0* 22.6*  --  20.1*  PLT  --   --   --  157  --  186  --  199  LABPROT 15.4*  --   --   --   --   --   --   --   INR 1.20  --   --   --   --   --   --   --   HEPARINUNFRC  --  0.33  --   --   --   --  <0.10*  --   CREATININE  --   --   --  8.64*  --  9.34*  --  9.98*  CKTOTAL  --   --   --  [id number]*  --  [id number]*  --  15910*  < > = values in this interval not displayed.  Estimated Creatinine Clearance: 10.7 mL/min (by C-G formula based on Cr of 9.98).   Assessment: 55 y/o male with B/L lower limb pain for 2 days after he sustained a fall. Pain too severe to walk for last 48h. Reports dark colored urine. Patient is found to have Rhabdo, AKI, hyponatremia, hyperkalemia, +DVT. Ortho was consulted for BLE compartment syndrome. 6/15 he was taken emergently to OR for bilateral lower leg four-compartment fasciotomy. The patient is s/p repeat fasciotomy I&D earlier today with application of wound vac.  Heparin previously running at 1950 units/hr, drip turned off for OR this AM before heparin level was collected.  No overt bleeding or complications noted.  Goal of Therapy:  Heparin level 0.3-0.7 units/ml Monitor platelets by anticoagulation protocol: Yes   Plan:  F/u plans to resume heparin after OR today.  Tad MooreJessica Royce Stegman, Pharm D, BCPS   Clinical Pharmacist Pager [phone]  01/07/2016 8:07 AM

## 2016-01-08 ENCOUNTER — Encounter (HOSPITAL_COMMUNITY): Payer: Self-pay | Admitting: Orthopaedic Surgery

## 2016-01-08 ENCOUNTER — Other Ambulatory Visit (HOSPITAL_COMMUNITY): Payer: Self-pay | Admitting: Family

## 2016-01-08 DIAGNOSIS — M6282 Rhabdomyolysis: Secondary | ICD-10-CM

## 2016-01-08 LAB — TYPE AND SCREEN
ABO/RH(D): O POS
ANTIBODY SCREEN: NEGATIVE
UNIT DIVISION: 0
UNIT DIVISION: 0

## 2016-01-08 LAB — RENAL FUNCTION PANEL
ANION GAP: 11 (ref 5–15)
Albumin: 1.1 g/dL — ABNORMAL LOW (ref 3.5–5.0)
BUN: 77 mg/dL — ABNORMAL HIGH (ref 6–20)
CHLORIDE: 89 mmol/L — AB (ref 101–111)
CO2: 30 mmol/L (ref 22–32)
CREATININE: 8.21 mg/dL — AB (ref 0.61–1.24)
Calcium: 6.5 mg/dL — ABNORMAL LOW (ref 8.9–10.3)
GFR, EST AFRICAN AMERICAN: 8 mL/min — AB (ref >60)
GFR, EST NON AFRICAN AMERICAN: 7 mL/min — AB (ref >60)
Glucose, Bld: 121 mg/dL — ABNORMAL HIGH (ref 65–99)
POTASSIUM: 4.3 mmol/L (ref 3.5–5.1)
Phosphorus: 7.4 mg/dL — ABNORMAL HIGH (ref 2.5–4.6)
Sodium: 130 mmol/L — ABNORMAL LOW (ref 135–145)

## 2016-01-08 LAB — CK: CK TOTAL: 9990 U/L — AB (ref 49–397)

## 2016-01-08 LAB — CBC
HCT: 23.6 % — ABNORMAL LOW (ref 39.0–52.0)
Hemoglobin: 8 g/dL — ABNORMAL LOW (ref 13.0–17.0)
MCH: 27.5 pg (ref 26.0–34.0)
MCHC: 33.9 g/dL (ref 30.0–36.0)
MCV: 81.1 fL (ref 78.0–100.0)
PLATELETS: 200 10*3/uL (ref 150–400)
RBC: 2.91 MIL/uL — ABNORMAL LOW (ref 4.22–5.81)
RDW: 13.5 % (ref 11.5–15.5)
WBC: 10.9 10*3/uL — AB (ref 4.0–10.5)

## 2016-01-08 LAB — CALCIUM, IONIZED: CALCIUM, IONIZED, SERUM: 3.6 mg/dL — AB (ref 4.5–5.6)

## 2016-01-08 LAB — HEPATITIS B SURFACE ANTIGEN: HEP B S AG: NEGATIVE

## 2016-01-08 LAB — HEPATITIS B SURFACE ANTIBODY,QUALITATIVE: Hep B S Ab: NONREACTIVE

## 2016-01-08 LAB — HEPATITIS B CORE ANTIBODY, TOTAL: Hep B Core Total Ab: NEGATIVE

## 2016-01-08 LAB — HEPARIN LEVEL (UNFRACTIONATED): HEPARIN UNFRACTIONATED: 0.16 [IU]/mL — AB (ref 0.30–0.70)

## 2016-01-08 MED ORDER — SODIUM CHLORIDE 0.9% FLUSH
10.0000 mL | Freq: Two times a day (BID) | INTRAVENOUS | Status: DC
  Administered 2016-01-08 – 2016-01-15 (×12): 10 mL
  Administered 2016-01-16: 3 mL
  Administered 2016-01-16 – 2016-01-22 (×8): 10 mL

## 2016-01-08 MED ORDER — CEFAZOLIN SODIUM-DEXTROSE 2-4 GM/100ML-% IV SOLN
2.0000 g | INTRAVENOUS | Status: DC
  Filled 2016-01-08: qty 100

## 2016-01-08 MED ORDER — SENNOSIDES-DOCUSATE SODIUM 8.6-50 MG PO TABS
1.0000 | ORAL_TABLET | Freq: Two times a day (BID) | ORAL | Status: DC
  Administered 2016-01-08 – 2016-01-13 (×12): 1 via ORAL
  Filled 2016-01-08 (×13): qty 1

## 2016-01-08 MED ORDER — SODIUM CHLORIDE 0.9% FLUSH
10.0000 mL | INTRAVENOUS | Status: DC | PRN
  Administered 2016-01-11: 20 mL
  Administered 2016-01-11 – 2016-01-12 (×2): 10 mL
  Filled 2016-01-08 (×3): qty 40

## 2016-01-08 MED ORDER — FUROSEMIDE 10 MG/ML IJ SOLN
160.0000 mg | Freq: Once | INTRAVENOUS | Status: AC
  Administered 2016-01-08: 160 mg via INTRAVENOUS
  Filled 2016-01-08: qty 16

## 2016-01-08 NOTE — Progress Notes (Signed)
Assessment/Plan: 55 year old white male with hypertension and chronic pain who presents with rhabdo after his fall and immobility in the setting of narcotics and Klonopin 1.Renal- AKI  due to rhabdomyolysis after fall and staying down.  Will dose furosemide once today, stop IVFs and plan IHD for volume in AM Wed. 2. Hypertension/volume - blood pressure is OK for now. IV fluids will likely cause blood pressure go higher. No need to add agent at this time 3. Anemia -   Subjective: Interval History: Had IHD yesterday  Objective: Vital signs in last 24 hours: Temp:  [97.7 F (36.5 C)-99.7 F (37.6 C)] 99.7 F (37.6 C) (06/20 0300) Pulse Rate:  [88-121] 89 (06/20 0600) Resp:  [11-23] 14 (06/20 0600) BP: (110-146)/(58-99) 119/63 mmHg (06/20 0600) SpO2:  [79 %-100 %] 91 % (06/20 0600) Weight change:   Intake/Output from previous day: 06/19 0701 - 06/20 0700 In: 6611.7 [I.V.:5991.7; Blood:620] Out: 1427 [Urine:75; Drains:1350; Blood:1] Intake/Output this shift:    General appearance: alert and cooperative Male genitalia: marked scrotal edema Extremities: edema anasarca  Lab Results:  Recent Labs  01/07/16 0445 01/07/16 2015 01/08/16 0400  WBC 9.7  --  10.9*  HGB 7.0* 8.4* 8.0*  HCT 20.1* 24.8* 23.6*  PLT 199  --  200   BMET:  Recent Labs  01/07/16 0445 01/08/16 0400  NA 127* 130*  K 4.3 4.3  CL 88* 89*  CO2 25 30  GLUCOSE 136* 121*  BUN 108* 77*  CREATININE 9.98* 8.21*  CALCIUM 6.0* 6.5*   No results for input(s): PTH in the last 72 hours. Iron Studies: No results for input(s): IRON, TIBC, TRANSFERRIN, FERRITIN in the last 72 hours. Studies/Results: Dg Chest Port 1 View  01/06/2016  CLINICAL DATA:  Central line placement. EXAM: PORTABLE CHEST 1 VIEW COMPARISON:  01/03/2016 FINDINGS: Interval placement of left IJ central venous catheter with tip over the SVC. Lungs are adequately inflated with mild linear density over the left base likely atelectasis. No  evidence of effusion no pneumothorax. Cardiomediastinal silhouette and remainder of the exam is unchanged. IMPRESSION: Linear atelectasis left base. Left IJ central venous catheter with tip over the SVC. No pneumothorax. Electronically Signed   By: Elberta Fortisaniel  Boyle M.D.   On: 01/06/2016 20:57    Scheduled: . sodium chloride   Intravenous Once  . amitriptyline  50 mg Oral QHS  . Chlorhexidine Gluconate Cloth  6 each Topical Q0600  . citalopram  40 mg Oral Daily  . feeding supplement (ENSURE ENLIVE)  237 mL Oral TID BM  . multivitamin with minerals  1 tablet Oral Daily  . mupirocin ointment  1 application Nasal BID  . pantoprazole  40 mg Oral Daily  . risperiDONE  1 mg Oral QHS     LOS: 5 days   Mathew Storck C 01/08/2016,8:26 AM

## 2016-01-08 NOTE — Progress Notes (Signed)
ANTICOAGULATION CONSULT NOTE - Follow Up Consult  Pharmacy Consult for Heparin Indication: RLE DVTs  Allergies  Allergen Reactions  . Sulfacetamide Sodium Other (See Comments)    edema    Patient Measurements: Height: 6\' 1"  (185.4 cm) Weight:  (unable to obtain; bed weight not accurate) IBW/kg (Calculated) : 79.9 Heparin Dosing Weight: 99 kg  Vital Signs: Temp: 98.6 F (37 C) (06/20 1600) Temp Source: Oral (06/20 1600) BP: 149/72 mmHg (06/20 1700) Pulse Rate: 100 (06/20 1700)  Labs:  Recent Labs  01/06/16 0217 01/06/16 1822 01/07/16 0445 01/07/16 2015 01/08/16 0400 01/08/16 1730  HGB 7.8*  --  7.0* 8.4* 8.0*  --   HCT 22.6*  --  20.1* 24.8* 23.6*  --   PLT 186  --  199  --  200  --   HEPARINUNFRC  --  <0.10*  --   --   --  0.16*  CREATININE 9.34*  --  9.98*  --  8.21*  --   CKTOTAL [id number]*  --  15910*  --  9990*  --     Estimated Creatinine Clearance: 13.1 mL/min (by C-G formula based on Cr of 8.21).   Assessment: 55 y/o male with B/L lower limb pain for 2 days after he sustained a fall. Pain too severe to walk for last 48h. Reports dark colored urine. Patient is found to have Rhabdo, AKI, hyponatremia, hyperkalemia, +RLE DVT. Ortho was consulted for BLE compartment syndrome. 6/15 he was taken emergently to OR for bilateral lower leg four-compartment fasciotomy. The patient is s/p repeat fasciotomy I&D with application of wound vac 6/19, plan for AKA vs BKA 6/21.  Heparin was restarted on 0800 on this morning. Heparin level is 0.16, subtherapeutic on 1950 units/hr. hgb 7 > 8 s/p 2 units of PRBC yesterday. Pltc wnl.   Goal of Therapy:  Heparin level 0.3-0.7 units/ml Monitor platelets by anticoagulation protocol: Yes   Plan:  - Increase IV heparin to 2200 units/hr - f/u 8 hr heparin level at 0230 - f/u AM cbc, monitor s/sx of bleeding. - f/u after amputation.   Jeffery HuggerMei Lakisha Gross, PharmD, BCPS  Clinical Pharmacist  Pager: [phone]    01/08/2016 6:07 PM

## 2016-01-08 NOTE — Progress Notes (Signed)
   01/08/16 1000  Clinical Encounter Type  Visited With Patient and family together  Visit Type Pre-op;Spiritual support  Referral From Chaplain  Consult/Referral To Chaplain  Spiritual Encounters  Spiritual Needs Prayer  [facility] responded to request from [facility] Michael to visit patient.  Patient requested prayer before surgery. [facility] prayed and provided ministry of presence. Rodney BoozeGail L Elbert Spickler 01/08/2016
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
## 2016-01-08 NOTE — Progress Notes (Signed)
Wound vac canisters changed without incidence. Will continue to monitor.

## 2016-01-08 NOTE — Progress Notes (Signed)
Patient ID: Jeffery Gross, male   DOB: 1960-08-22, 55 y.o.   MRN: [id number] I met with the patient and his friend this morning. Discussed that we will try to add him on for surgery or Wednesday tomorrow with a above-the-knee versus below the knee amputation the right depending on the soft tissue envelope and further debridement of the left lower extremity with anticipated split thickness skin graft to the left lower extremity.

## 2016-01-09 ENCOUNTER — Encounter (HOSPITAL_COMMUNITY)
Admission: EM | Disposition: A | Discharge status: Rehabilitation Facility | Payer: Self-pay | Source: Home / Self Care | Attending: Family Medicine

## 2016-01-09 ENCOUNTER — Encounter (HOSPITAL_COMMUNITY): Payer: Self-pay | Admitting: Anesthesiology

## 2016-01-09 ENCOUNTER — Inpatient Hospital Stay (HOSPITAL_COMMUNITY): Payer: BLUE CROSS/BLUE SHIELD | Admitting: Certified Registered Nurse Anesthetist

## 2016-01-09 HISTORY — PX: AMPUTATION: SHX166

## 2016-01-09 LAB — POCT I-STAT 4, (NA,K, GLUC, HGB,HCT)
GLUCOSE: 95 mg/dL (ref 65–99)
HCT: 23 % — ABNORMAL LOW (ref 39.0–52.0)
HEMOGLOBIN: 7.8 g/dL — AB (ref 13.0–17.0)
POTASSIUM: 4.5 mmol/L (ref 3.5–5.1)
Sodium: 131 mmol/L — ABNORMAL LOW (ref 135–145)

## 2016-01-09 LAB — RENAL FUNCTION PANEL
Albumin: 1.1 g/dL — ABNORMAL LOW (ref 3.5–5.0)
Anion gap: 16 — ABNORMAL HIGH (ref 5–15)
BUN: 88 mg/dL — ABNORMAL HIGH (ref 6–20)
CHLORIDE: 88 mmol/L — AB (ref 101–111)
CO2: 25 mmol/L (ref 22–32)
CREATININE: 9.61 mg/dL — AB (ref 0.61–1.24)
Calcium: 7.1 mg/dL — ABNORMAL LOW (ref 8.9–10.3)
GFR calc non Af Amer: 5 mL/min — ABNORMAL LOW (ref >60)
GFR, EST AFRICAN AMERICAN: 6 mL/min — AB (ref >60)
GLUCOSE: 97 mg/dL (ref 65–99)
Phosphorus: 8.5 mg/dL — ABNORMAL HIGH (ref 2.5–4.6)
Potassium: 5.4 mmol/L — ABNORMAL HIGH (ref 3.5–5.1)
Sodium: 129 mmol/L — ABNORMAL LOW (ref 135–145)

## 2016-01-09 LAB — CBC
HCT: 27.1 % — ABNORMAL LOW (ref 39.0–52.0)
Hemoglobin: 9.1 g/dL — ABNORMAL LOW (ref 13.0–17.0)
MCH: 27.1 pg (ref 26.0–34.0)
MCHC: 33.6 g/dL (ref 30.0–36.0)
MCV: 80.7 fL (ref 78.0–100.0)
PLATELETS: 201 10*3/uL (ref 150–400)
RBC: 3.36 MIL/uL — ABNORMAL LOW (ref 4.22–5.81)
RDW: 13.4 % (ref 11.5–15.5)
WBC: 12.3 10*3/uL — AB (ref 4.0–10.5)

## 2016-01-09 LAB — PREPARE RBC (CROSSMATCH)

## 2016-01-09 LAB — CK: CK TOTAL: 6427 U/L — AB (ref 49–397)

## 2016-01-09 LAB — HEPARIN LEVEL (UNFRACTIONATED): HEPARIN UNFRACTIONATED: 0.24 [IU]/mL — AB (ref 0.30–0.70)

## 2016-01-09 SURGERY — AMPUTATION BELOW KNEE
Anesthesia: General | Site: Leg Lower | Laterality: Bilateral

## 2016-01-09 MED ORDER — SODIUM CHLORIDE 0.9 % IV SOLN
INTRAVENOUS | Status: DC
Start: 2016-01-09 — End: 2016-01-12

## 2016-01-09 MED ORDER — MINERAL OIL LIGHT 100 % EX OIL
TOPICAL_OIL | CUTANEOUS | Status: AC
  Filled 2016-01-09: qty 25

## 2016-01-09 MED ORDER — LORAZEPAM 2 MG/ML IJ SOLN
1.0000 mg | INTRAMUSCULAR | Status: DC | PRN
  Administered 2016-01-10: 1 mg via INTRAVENOUS
  Filled 2016-01-09: qty 1

## 2016-01-09 MED ORDER — LIDOCAINE-PRILOCAINE 2.5-2.5 % EX CREA: 1.0000 "application " | TOPICAL_CREAM | CUTANEOUS | Status: DC | PRN

## 2016-01-09 MED ORDER — SODIUM CHLORIDE 0.9 % IV SOLN: 10.0000 mL/h | Freq: Once | INTRAVENOUS | Status: DC

## 2016-01-09 MED ORDER — ALBUMIN HUMAN 25 % IV SOLN
INTRAVENOUS | Status: AC
  Administered 2016-01-09: 12.5 g
  Filled 2016-01-09: qty 50

## 2016-01-09 MED ORDER — FENTANYL CITRATE (PF) 250 MCG/5ML IJ SOLN
INTRAMUSCULAR | Status: AC
  Filled 2016-01-09: qty 5

## 2016-01-09 MED ORDER — ARTIFICIAL TEARS OP OINT
TOPICAL_OINTMENT | OPHTHALMIC | Status: AC
  Filled 2016-01-09: qty 3.5

## 2016-01-09 MED ORDER — ALTEPLASE 2 MG IJ SOLR: 2.0000 mg | Freq: Once | INTRAMUSCULAR | Status: DC | PRN

## 2016-01-09 MED ORDER — SUGAMMADEX SODIUM 200 MG/2ML IV SOLN
INTRAVENOUS | Status: AC
  Filled 2016-01-09: qty 2

## 2016-01-09 MED ORDER — PROPOFOL 10 MG/ML IV BOLUS
INTRAVENOUS | Status: DC | PRN
  Administered 2016-01-09: 200 mg via INTRAVENOUS
  Administered 2016-01-09: 50 mg via INTRAVENOUS

## 2016-01-09 MED ORDER — PROPOFOL 10 MG/ML IV BOLUS
INTRAVENOUS | Status: AC
  Filled 2016-01-09: qty 20

## 2016-01-09 MED ORDER — HEPARIN SODIUM (PORCINE) 1000 UNIT/ML DIALYSIS: 20.0000 [IU]/kg | INTRAMUSCULAR | Status: DC | PRN

## 2016-01-09 MED ORDER — ONDANSETRON HCL 4 MG/2ML IJ SOLN: 4.0000 mg | Freq: Four times a day (QID) | INTRAMUSCULAR | Status: DC | PRN

## 2016-01-09 MED ORDER — LIDOCAINE 2% (20 MG/ML) 5 ML SYRINGE
INTRAMUSCULAR | Status: AC
  Filled 2016-01-09: qty 5

## 2016-01-09 MED ORDER — MUPIROCIN CALCIUM 2 % EX CREA
TOPICAL_CREAM | CUTANEOUS | Status: AC
  Filled 2016-01-09: qty 15

## 2016-01-09 MED ORDER — SODIUM CHLORIDE 0.9 % IV SOLN
INTRAVENOUS | Status: DC
  Administered 2016-01-09: 13:00:00 via INTRAVENOUS

## 2016-01-09 MED ORDER — SODIUM CHLORIDE 0.9 % IV SOLN: 100.0000 mL | INTRAVENOUS | Status: DC | PRN

## 2016-01-09 MED ORDER — ACETAMINOPHEN 325 MG PO TABS
650.0000 mg | ORAL_TABLET | Freq: Four times a day (QID) | ORAL | Status: DC | PRN
  Administered 2016-01-16: 650 mg via ORAL
  Filled 2016-01-09: qty 2

## 2016-01-09 MED ORDER — PHENYLEPHRINE HCL 10 MG/ML IJ SOLN
INTRAMUSCULAR | Status: DC | PRN
  Administered 2016-01-09: 80 ug via INTRAVENOUS

## 2016-01-09 MED ORDER — DEXMEDETOMIDINE BOLUS VIA INFUSION
0.7000 ug/kg | Freq: Once | INTRAVENOUS | Status: AC
  Administered 2016-01-09: 76.79 ug via INTRAVENOUS

## 2016-01-09 MED ORDER — METOCLOPRAMIDE HCL 5 MG/ML IJ SOLN: 5.0000 mg | Freq: Three times a day (TID) | INTRAMUSCULAR | Status: DC | PRN

## 2016-01-09 MED ORDER — PROPOFOL 500 MG/50ML IV EMUL
INTRAVENOUS | Status: AC
  Filled 2016-01-09: qty 50

## 2016-01-09 MED ORDER — ONDANSETRON HCL 4 MG/2ML IJ SOLN: 4.0000 mg | Freq: Once | INTRAMUSCULAR | Status: DC | PRN

## 2016-01-09 MED ORDER — CEFAZOLIN SODIUM 1 G IJ SOLR
INTRAMUSCULAR | Status: AC
  Filled 2016-01-09: qty 10

## 2016-01-09 MED ORDER — DEXTROSE 5 % IV SOLN: 500.0000 mg | Freq: Four times a day (QID) | INTRAVENOUS | Status: DC | PRN

## 2016-01-09 MED ORDER — MUPIROCIN 2 % EX OINT
TOPICAL_OINTMENT | CUTANEOUS | Status: AC
  Filled 2016-01-09: qty 22

## 2016-01-09 MED ORDER — METHOCARBAMOL 500 MG PO TABS
500.0000 mg | ORAL_TABLET | Freq: Four times a day (QID) | ORAL | Status: DC | PRN
  Administered 2016-01-10 – 2016-01-23 (×14): 500 mg via ORAL
  Filled 2016-01-09 (×14): qty 1

## 2016-01-09 MED ORDER — ACETAMINOPHEN 650 MG RE SUPP: 650.0000 mg | Freq: Four times a day (QID) | RECTAL | Status: DC | PRN

## 2016-01-09 MED ORDER — HYDROMORPHONE HCL 1 MG/ML IJ SOLN
0.2500 mg | INTRAMUSCULAR | Status: DC | PRN
  Administered 2016-01-09 (×4): 0.5 mg via INTRAVENOUS

## 2016-01-09 MED ORDER — METOCLOPRAMIDE HCL 10 MG PO TABS: 5.0000 mg | ORAL_TABLET | Freq: Three times a day (TID) | ORAL | Status: DC | PRN

## 2016-01-09 MED ORDER — LIDOCAINE HCL (CARDIAC) 20 MG/ML IV SOLN
INTRAVENOUS | Status: DC | PRN
  Administered 2016-01-09: 100 mg via INTRAVENOUS

## 2016-01-09 MED ORDER — LIDOCAINE HCL (PF) 1 % IJ SOLN: 5.0000 mL | INTRAMUSCULAR | Status: DC | PRN

## 2016-01-09 MED ORDER — CEFAZOLIN SODIUM-DEXTROSE 2-3 GM-% IV SOLR
INTRAVENOUS | Status: DC | PRN
  Administered 2016-01-09: 2 g via INTRAVENOUS

## 2016-01-09 MED ORDER — CHLORHEXIDINE GLUCONATE 4 % EX LIQD
60.0000 mL | Freq: Once | CUTANEOUS | Status: AC
  Administered 2016-01-09: 4 via TOPICAL

## 2016-01-09 MED ORDER — HYDROMORPHONE HCL 1 MG/ML IJ SOLN: 0.2500 mg | INTRAMUSCULAR | Status: DC | PRN

## 2016-01-09 MED ORDER — DEXAMETHASONE SODIUM PHOSPHATE 10 MG/ML IJ SOLN
INTRAMUSCULAR | Status: AC
  Filled 2016-01-09: qty 1

## 2016-01-09 MED ORDER — ONDANSETRON HCL 4 MG/2ML IJ SOLN
INTRAMUSCULAR | Status: AC
  Filled 2016-01-09: qty 2

## 2016-01-09 MED ORDER — SUCCINYLCHOLINE CHLORIDE 200 MG/10ML IV SOSY
PREFILLED_SYRINGE | INTRAVENOUS | Status: AC
  Filled 2016-01-09: qty 10

## 2016-01-09 MED ORDER — SUCCINYLCHOLINE CHLORIDE 20 MG/ML IJ SOLN
INTRAMUSCULAR | Status: DC | PRN
  Administered 2016-01-09: 140 mg via INTRAVENOUS

## 2016-01-09 MED ORDER — LIDOCAINE 2% (20 MG/ML) 5 ML SYRINGE
INTRAMUSCULAR | Status: AC
  Filled 2016-01-09: qty 10

## 2016-01-09 MED ORDER — HYDROMORPHONE HCL 1 MG/ML IJ SOLN
1.0000 mg | INTRAMUSCULAR | Status: DC | PRN
  Administered 2016-01-09 – 2016-01-10 (×2): 1 mg via INTRAVENOUS
  Filled 2016-01-09 (×2): qty 1

## 2016-01-09 MED ORDER — PENTAFLUOROPROP-TETRAFLUOROETH EX AERO: 1.0000 "application " | INHALATION_SPRAY | CUTANEOUS | Status: DC | PRN

## 2016-01-09 MED ORDER — PHENYLEPHRINE HCL 10 MG/ML IJ SOLN
10.0000 mg | INTRAVENOUS | Status: DC | PRN
  Administered 2016-01-09: 100 ug/min via INTRAVENOUS

## 2016-01-09 MED ORDER — ONDANSETRON HCL 4 MG PO TABS: 4.0000 mg | ORAL_TABLET | Freq: Four times a day (QID) | ORAL | Status: DC | PRN

## 2016-01-09 MED ORDER — GLYCOPYRROLATE 0.2 MG/ML IV SOSY
PREFILLED_SYRINGE | INTRAVENOUS | Status: AC
  Filled 2016-01-09: qty 3

## 2016-01-09 MED ORDER — HEPARIN SODIUM (PORCINE) 1000 UNIT/ML DIALYSIS: 1000.0000 [IU] | INTRAMUSCULAR | Status: DC | PRN

## 2016-01-09 MED ORDER — PHENYLEPHRINE HCL 10 MG/ML IJ SOLN
INTRAMUSCULAR | Status: AC
  Filled 2016-01-09: qty 1

## 2016-01-09 MED ORDER — HYDROMORPHONE HCL 1 MG/ML IJ SOLN
INTRAMUSCULAR | Status: AC
  Administered 2016-01-09: 0.5 mg via INTRAVENOUS
  Filled 2016-01-09: qty 2

## 2016-01-09 MED ORDER — 0.9 % SODIUM CHLORIDE (POUR BTL) OPTIME
TOPICAL | Status: DC | PRN
  Administered 2016-01-09: 1000 mL

## 2016-01-09 SURGICAL SUPPLY — 69 items
BLADE SAW RECIP 87.9 MT (BLADE) ×4 IMPLANT
BLADE SURG 21 STRL SS (BLADE) ×4 IMPLANT
BNDG CMPR 9X4 STRL LF SNTH (GAUZE/BANDAGES/DRESSINGS) ×2
BNDG COHESIVE 6X5 TAN STRL LF (GAUZE/BANDAGES/DRESSINGS) ×8 IMPLANT
BNDG ESMARK 4X9 LF (GAUZE/BANDAGES/DRESSINGS) ×4 IMPLANT
BNDG GAUZE ELAST 4 BULKY (GAUZE/BANDAGES/DRESSINGS) ×8 IMPLANT
BNDG GAUZE STRTCH 6 (GAUZE/BANDAGES/DRESSINGS) IMPLANT
CANISTER WOUND CARE 500ML ATS (WOUND CARE) ×6 IMPLANT
COVER SURGICAL LIGHT HANDLE (MISCELLANEOUS) ×8 IMPLANT
CUFF TOURNIQUET SINGLE 18IN (TOURNIQUET CUFF) IMPLANT
CUFF TOURNIQUET SINGLE 24IN (TOURNIQUET CUFF) ×3 IMPLANT
CUFF TOURNIQUET SINGLE 34IN LL (TOURNIQUET CUFF) IMPLANT
CUFF TOURNIQUET SINGLE 44IN (TOURNIQUET CUFF) IMPLANT
DERMACARRIERS GRAFT 1 TO 1.5 (DISPOSABLE)
DRAPE EXTREMITY BILATERAL (DRAPES) ×3 IMPLANT
DRAPE EXTREMITY T 121X128X90 (DRAPE) ×4 IMPLANT
DRAPE INCISE IOBAN 66X45 STRL (DRAPES) ×3 IMPLANT
DRAPE PROXIMA HALF (DRAPES) ×4 IMPLANT
DRAPE U-SHAPE 47X51 STRL (DRAPES) ×7 IMPLANT
DRSG ADAPTIC 3X8 NADH LF (GAUZE/BANDAGES/DRESSINGS) ×4 IMPLANT
DRSG MEPITEL 4X7.2 (GAUZE/BANDAGES/DRESSINGS) ×4 IMPLANT
DRSG PAD ABDOMINAL 8X10 ST (GAUZE/BANDAGES/DRESSINGS) ×4 IMPLANT
DURAPREP 26ML APPLICATOR (WOUND CARE) ×4 IMPLANT
ELECT CAUTERY BLADE 6.4 (BLADE) ×3 IMPLANT
ELECT REM PT RETURN 9FT ADLT (ELECTROSURGICAL) ×4
ELECTRODE REM PT RTRN 9FT ADLT (ELECTROSURGICAL) ×2 IMPLANT
GAUZE SPONGE 4X4 12PLY STRL (GAUZE/BANDAGES/DRESSINGS) ×4 IMPLANT
GLOVE BIOGEL PI IND STRL 6.5 (GLOVE) ×1 IMPLANT
GLOVE BIOGEL PI IND STRL 7.5 (GLOVE) ×1 IMPLANT
GLOVE BIOGEL PI IND STRL 9 (GLOVE) ×2 IMPLANT
GLOVE BIOGEL PI INDICATOR 6.5 (GLOVE) ×2
GLOVE BIOGEL PI INDICATOR 7.5 (GLOVE) ×2
GLOVE BIOGEL PI INDICATOR 9 (GLOVE) ×2
GLOVE SURG ORTHO 9.0 STRL STRW (GLOVE) ×4 IMPLANT
GLOVE SURG SS PI 6.5 STRL IVOR (GLOVE) ×3 IMPLANT
GOWN STRL REUS W/ TWL LRG LVL3 (GOWN DISPOSABLE) ×1 IMPLANT
GOWN STRL REUS W/ TWL XL LVL3 (GOWN DISPOSABLE) ×4 IMPLANT
GOWN STRL REUS W/TWL LRG LVL3 (GOWN DISPOSABLE) ×4
GOWN STRL REUS W/TWL XL LVL3 (GOWN DISPOSABLE) ×8
GRAFT DERMACARRIERS 1 TO 1.5 (DISPOSABLE) IMPLANT
KIT BASIN OR (CUSTOM PROCEDURE TRAY) ×4 IMPLANT
KIT ROOM TURNOVER OR (KITS) ×4 IMPLANT
MANIFOLD NEPTUNE II (INSTRUMENTS) ×4 IMPLANT
NDL HYPO 25GX1X1/2 BEV (NEEDLE) IMPLANT
NEEDLE HYPO 25GX1X1/2 BEV (NEEDLE) IMPLANT
NS IRRIG 1000ML POUR BTL (IV SOLUTION) ×4 IMPLANT
PACK GENERAL/GYN (CUSTOM PROCEDURE TRAY) ×1 IMPLANT
PACK ORTHO EXTREMITY (CUSTOM PROCEDURE TRAY) ×4 IMPLANT
PAD ARMBOARD 7.5X6 YLW CONV (MISCELLANEOUS) ×8 IMPLANT
PAD CAST 4YDX4 CTTN HI CHSV (CAST SUPPLIES) IMPLANT
PADDING CAST COTTON 4X4 STRL (CAST SUPPLIES)
PREVENA INCISION MGT 90 150 (MISCELLANEOUS) ×12 IMPLANT
SPONGE LAP 18X18 X RAY DECT (DISPOSABLE) ×3 IMPLANT
STAPLER VISISTAT 35W (STAPLE) IMPLANT
STOCKINETTE IMPERVIOUS LG (DRAPES) ×7 IMPLANT
SUCTION FRAZIER HANDLE 10FR (MISCELLANEOUS)
SUCTION TUBE FRAZIER 10FR DISP (MISCELLANEOUS) IMPLANT
SUT ETHILON 2 0 PSLX (SUTURE) ×18 IMPLANT
SUT ETHILON 4 0 PS 2 18 (SUTURE) IMPLANT
SUT SILK 2 0 (SUTURE) ×4
SUT SILK 2-0 18XBRD TIE 12 (SUTURE) ×2 IMPLANT
SUT VIC AB 1 CTX 27 (SUTURE) IMPLANT
SYR CONTROL 10ML LL (SYRINGE) IMPLANT
TOWEL OR 17X24 6PK STRL BLUE (TOWEL DISPOSABLE) ×4 IMPLANT
TOWEL OR 17X26 10 PK STRL BLUE (TOWEL DISPOSABLE) ×4 IMPLANT
TUBE CONNECTING 12'X1/4 (SUCTIONS)
TUBE CONNECTING 12X1/4 (SUCTIONS) IMPLANT
WATER STERILE IRR 1000ML POUR (IV SOLUTION) ×4 IMPLANT
YANKAUER SUCT BULB TIP NO VENT (SUCTIONS) ×3 IMPLANT

## 2016-01-09 NOTE — Progress Notes (Signed)
Dr. Noreene LarssonJoslin in at bedside. After test interval at 120 ml / hr blood to be completed in one hour.
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
## 2016-01-09 NOTE — Op Note (Signed)
01/03/2016 - 01/09/2016  2:32 PM  PATIENT:  Jeffery Gross    PRE-OPERATIVE DIAGNOSIS:  Bilateral Compartment Syndrome Legs  POST-OPERATIVE DIAGNOSIS:  Same  PROCEDURE:  Bilateral Above Knee Amputation, Apply Wound VAC 2  SURGEON:  Dakwan Pridgen V, MD  PHYSICIAN ASSISTANT:None ANESTHESIA:   General  PREOPERATIVE INDICATIONS:  Jeffery HarbourScott D Lucier is a  55 y.o. male with a diagnosis of Bilateral Compartment Syndrome Legs who failed conservative measures and elected for surgical management.    The risks benefits and alternatives were discussed with the patient preoperatively including but not limited to the risks of infection, bleeding, nerve injury, cardiopulmonary complications, the need for revision surgery, among others, and the patient was willing to proceed.  OPERATIVE IMPLANTS: wound VAC 2  OPERATIVE FINDINGS: all muscles in the left lower extremity were necrotic similarly all muscles in the right lower extremity were also necrotic.  OPERATIVE PROCEDURE: patient is a 55 year old gentleman who is status post bilateral compartment syndromes. Patient has undergone serial irrigation and debridements,and 5 she is. his renal function has been maintained by dialysis due to the  High myoglobin levels. Patient presents at this time for evaluation for the right lower extremity for transtibial  amputation versus above-the knee amputation and evaluation for the left lower extremity for placement debridement and placement of skin graft.  Patient brought the operating room and underwent general anesthetic. After adequate levels of anesthesia were obtained patient's bilateral lower extr using Betadine prep then prepped with DuraPrep and draped into a sterile field. A timeout was called. Examination of the right lower extremity showed all the muscle compartments in the right lower extremity to be dead the muscle  Had poor color had no contractility. Decision was made to proceed with above-knee amputation the  right. A fishmouth incision was made to the distal thigh. This was carried down to the bone which was resected. The vascular bundles were clamped the sciatic nerve was pulled cut and allowed to retract and the leg was delivered off the field. The vascular bundles were suture ligated with 2-0 silk. Electrocautery was used for hemostasis. Thedeep and superficial fascial layers and skin was closed using 2-0 nylon. A Prevena wound VAC was applied and this had a good suction fit. Attention was then focused on the left lower extremity. Examination showed all the muscles to be nonviable. They did not contract they had poor consistency they 41 graft and they had poor color. Both the soleus and gastrocnemius muscles were completely necrotic all the muscles in the deep posterior compartment with also necrotic and the anterior compartment had previously been debrided and this was also necrotic. At this time with the patient's massive amount of necrotic muscle in the left lower extremity and his requirement of dialysis to support his renal function and was determined that for life-saving intervention patient would require an above-the-knee amputation  To resect the necrotic muscle. A fishmouth incision was made through the distal thigh this was carried down the vascular bundles were clamped the sciatic nerve was pulled cut and allowed retract the bone was resecprocating saw. Hemostasis was obtained. The deep and superficial fascial layers and skin  Closed using 2-0 nylon. A Prevena wound VAC was applied this had a good suction fit patient was extubated taken to the PACU in stable condition.

## 2016-01-09 NOTE — Transfer of Care (Signed)
Immediate Anesthesia Transfer of Care Note  Patient: Jeffery Gross  Procedure(s) Performed: Procedure(s): Bilateral Above Knee Amputation, Apply Wound VAC (Bilateral)  Patient Location: PACU  Anesthesia Type:General  Level of Consciousness: awake  Airway & Oxygen Therapy: Patient Spontanous Breathing and Patient connected to face mask oxygen  Post-op Assessment: Report given to RN and Post -op Vital signs reviewed and stable  Post vital signs: Reviewed and stable  Last Vitals:  Filed Vitals:   01/09/16 1123 01/09/16 1435  BP: 118/63 147/73  Pulse: 92 98  Temp: 36.1 C   Resp: 14 14    Last Pain:  Filed Vitals:   01/09/16 1442  PainSc: Asleep      Patients Stated Pain Goal: 2 (01/08/16 1700)  Complications: No apparent anesthesia complications

## 2016-01-09 NOTE — Progress Notes (Signed)
ANTICOAGULATION CONSULT NOTE - Follow Up Consult  Pharmacy Consult for heparin Indication: DVT  Labs:  Recent Labs  01/06/16 1822  01/07/16 0445 01/07/16 2015 01/08/16 0400 01/08/16 1730 01/09/16 0320  HGB  --   < > 7.0* 8.4* 8.0*  --  9.1*  HCT  --   < > 20.1* 24.8* 23.6*  --  27.1*  PLT  --   --  199  --  200  --  201  HEPARINUNFRC <0.10*  --   --   --   --  0.16* 0.24*  CREATININE  --   --  9.98*  --  8.21*  --   --   CKTOTAL  --   --  15910*  --  9990*  --   --   < > = values in this interval not displayed.   Assessment: 55yo male remains subtherapeutic on heparin after rate increase but getting close to goal.  Goal of Therapy:  Heparin level 0.3-0.7 units/ml   Plan:  Will increase heparin gtt by 3 units/kg/hr to 2500 units/hr and check level in 8hr.  Vernard GamblesVeronda Jailene Cupit, PharmD, BCPS  01/09/2016,4:16 AM

## 2016-01-09 NOTE — Progress Notes (Signed)
Report given to Bridgett in preop.  Pt is still in HD at this time, but plan is for pt to go straight from HD to OR.

## 2016-01-09 NOTE — Anesthesia Procedure Notes (Signed)
Procedure Name: Intubation Date/Time: 01/09/2016 1:15 PM Performed by: Caren MacadamARTER, Darsh Vandevoort W Pre-anesthesia Checklist: Patient identified, Emergency Drugs available, Suction available and Patient being monitored Patient Re-evaluated:Patient Re-evaluated prior to inductionOxygen Delivery Method: Circle system utilized Preoxygenation: Pre-oxygenation with 100% oxygen Intubation Type: IV induction Ventilation: Mask ventilation without difficulty Grade View: Grade I Tube type: Oral Number of attempts: 1 Airway Equipment and Method: Stylet,  Oral airway and Video-laryngoscopy Placement Confirmation: ETT inserted through vocal cords under direct vision,  positive ETCO2 and breath sounds checked- equal and bilateral Secured at: 22 cm Tube secured with: Tape Dental Injury: Teeth and Oropharynx as per pre-operative assessment  Difficulty Due To: Difficult Airway- due to reduced neck mobility, Difficulty was anticipated and Difficult Airway- due to limited oral opening Comments: Elective glide scope, grade one view, ett passes with ease, =BBS, +etCO2.

## 2016-01-09 NOTE — Consult Note (Signed)
  ORTHOPAEDIC CONSULTATION  REQUESTING PHYSICIAN: Kehinde T Eniola, MD  Chief Complaint: Bilateral lower extremity department syndromes with extreme muscle necrosis of the right lower extremity and status post fasciotomies and debridement for both lower extremities.  HPI: Jeffery Gross is a 55 y.o. male who presents with bilateral compartment syndromes after being found down on the floor for several days. Patient has undergone multiple serial irrigation and debridement for both legs. Patient's right lower extremity presents at this time for definitive treatment of her above-the-knee versus below knee amputation in the left lower extremity presents at this time for evaluation for split-thickness skin graft.  Past Medical History  Diagnosis Date  . Chest pain   . DDD (degenerative disc disease), cervical   . Back pain   . HTN (hypertension)   . Depression    Past Surgical History  Procedure Laterality Date  . Vasectomy    . Fasciotomy Bilateral 01/03/2016    Procedure: FASCIOTOMY;  Surgeon: Naiping M Xu, MD;  Location: [facility] OR;  Service: Orthopedics;  Laterality: Bilateral;  . I&d extremity Bilateral 01/05/2016    Procedure: IRRIGATION AND DEBRIDEMENT BILATERAL LOWER EXTREMITIES; WOUND VAC CHANGE;  Surgeon: Naiping M Xu, MD;  Location: [facility] OR;  Service: Orthopedics;  Laterality: Bilateral;  . I&d extremity Left 01/07/2016    Procedure: Irrigation and Debridement of Left Lower Extremity with Application of Wound Vac;  Surgeon: Naiping M Xu, MD;  Location: [facility] OR;  Service: Orthopedics;  Laterality: Left;   Social History   Social History  . Marital Status: Divorced    Spouse Name: N/A  . Number of Children: N/A  . Years of Education: N/A   Social History Main Topics  . Smoking status: Current Some Day Smoker    Types: Cigars  . Smokeless tobacco: None  . Alcohol Use: 0.0 oz/week    0 Standard drinks or equivalent per week     Comment: 12 pack a month  . Drug Use: None  . Sexual  Activity: Not Asked   Other Topics Concern  . None   Social History Narrative   Family History  Problem Relation Age of Onset  . Hypertension Father    - negative except otherwise stated in the family history section Allergies  Allergen Reactions  . Sulfacetamide Sodium Other (See Comments)    edema   Prior to Admission medications   Medication Sig Start Date End Date Taking? Authorizing Provider  ALPRAZolam (XANAX) 0.5 MG tablet Take 0.5 mg by mouth at bedtime as needed for sleep.   Yes Historical Provider, MD  amitriptyline (ELAVIL) 50 MG tablet Take 50 mg by mouth at bedtime.   Yes Historical Provider, MD  amLODipine (NORVASC) 10 MG tablet Take 10 mg by mouth daily.   Yes Historical Provider, MD  cetirizine (ZYRTEC) 10 MG tablet Take 10 mg by mouth daily.   Yes Historical Provider, MD  citalopram (CELEXA) 40 MG tablet Take 40 mg by mouth daily.   Yes Historical Provider, MD  clonazePAM (KLONOPIN) 0.5 MG tablet Take 0.5 mg by mouth 2 (two) times daily as needed for anxiety.   Yes Historical Provider, MD  cyclobenzaprine (FLEXERIL) 10 MG tablet Take 10 mg by mouth every 8 (eight) hours as needed. Muscle spasms 11/17/15  Yes Historical Provider, MD  hydrochlorothiazide (HYDRODIURIL) 25 MG tablet Take 25 mg by mouth daily.   Yes Historical Provider, MD  montelukast (SINGULAIR) 10 MG tablet Take 10 mg by mouth daily as needed. allergies 12/25/15    Yes Historical Provider, MD  oxyCODONE (ROXICODONE) 15 MG immediate release tablet Take 15 mg by mouth every 8 (eight) hours as needed. pain 12/24/15  Yes Historical Provider, MD  pantoprazole (PROTONIX) 40 MG tablet Take 40 mg by mouth daily. 10/26/15  Yes Historical Provider, MD  risperiDONE (RISPERDAL) 1 MG tablet Take 1 mg by mouth at bedtime. 10/26/15  Yes Historical Provider, MD   No results found. - pertinent xrays, CT, MRI studies were reviewed and independently interpreted  Positive ROS: All other systems have been reviewed and were  otherwise negative with the exception of those mentioned in the HPI and as above.  Physical Exam: General: Alert, no acute distress Cardiovascular: No pedal edema Respiratory: No cyanosis, no use of accessory musculature GI: No organomegaly, abdomen is soft and non-tender Skin: Large fasciotomy sites both lower extremities with significant muscle loss to the right lower extremity Neurologic: Patient does not have protective sensation Psychiatric: Patient is competent for consent with normal mood and affect Lymphatic: No axillary or cervical lymphadenopathy  MUSCULOSKELETAL:  On examination patient has wound vacs in place on both lower extremities. By report patient has a good granulation tissue to the left lower extremity and has had severe muscle necrosis to the right lower extremity.  Assessment: Assessment: Status post multiple irrigation and debridement for compartment syndrome with muscle and tissue loss to the right lower extremity and open fasciotomies the left lower extremity.  Plan: Plan: We will plan for further irrigation and debridement of the left lower extremity with possible split-thickness skin graft and reapplied a wound VAC. Discussed the patient that his right lower extremity may not be salvageable and may require an above-the-knee amputation. At best patient will proceed with a below the knee amputation on the right.  Thank you for the consult and the opportunity to see [name]  Jeffery Gilbo, MD [facility] [phone] 6:40 AM

## 2016-01-09 NOTE — Progress Notes (Signed)
Family Medicine contacted to consider PCA for pt to provide continual pain management. To continue treating at this time with PO oxycodone, dilaudid, and IV fentanyl pushes. MD added order for ativan to treat anxiety symptoms. Will continue to monitor.

## 2016-01-09 NOTE — H&P (View-Only) (Signed)
ORTHOPAEDIC CONSULTATION  REQUESTING PHYSICIAN: Doreene Eland, MD  Chief Complaint: Bilateral lower extremity department syndromes with extreme muscle necrosis of the right lower extremity and status post fasciotomies and debridement for both lower extremities.  HPI: Jeffery Gross is a 55 y.o. male who presents with bilateral compartment syndromes after being found down on the floor for several days. Patient has undergone multiple serial irrigation and debridement for both legs. Patient's right lower extremity presents at this time for definitive treatment of her above-the-knee versus below knee amputation in the left lower extremity presents at this time for evaluation for split-thickness skin graft.  Past Medical History  Diagnosis Date  . Chest pain   . DDD (degenerative disc disease), cervical   . Back pain   . HTN (hypertension)   . Depression    Past Surgical History  Procedure Laterality Date  . Vasectomy    . Fasciotomy Bilateral 01/03/2016    Procedure: FASCIOTOMY;  Surgeon: Tarry Kos, MD;  Location: [facility] OR;  Service: Orthopedics;  Laterality: Bilateral;  . I&d extremity Bilateral 01/05/2016    Procedure: IRRIGATION AND DEBRIDEMENT BILATERAL LOWER EXTREMITIES; WOUND VAC CHANGE;  Surgeon: Tarry Kos, MD;  Location: [facility] OR;  Service: Orthopedics;  Laterality: Bilateral;  . I&d extremity Left 01/07/2016    Procedure: Irrigation and Debridement of Left Lower Extremity with Application of Wound Vac;  Surgeon: Tarry Kos, MD;  Location: [facility] OR;  Service: Orthopedics;  Laterality: Left;   Social History   Social History  . Marital Status: Divorced    Spouse Name: N/A  . Number of Children: N/A  . Years of Education: N/A   Social History Main Topics  . Smoking status: Current Some Day Smoker    Types: Cigars  . Smokeless tobacco: None  . Alcohol Use: 0.0 oz/week    0 Standard drinks or equivalent per week     Comment: 12 pack a month  . Drug Use: None  . Sexual  Activity: Not Asked   Other Topics Concern  . None   Social History Narrative   Family History  Problem Relation Age of Onset  . Hypertension Father    - negative except otherwise stated in the family history section Allergies  Allergen Reactions  . Sulfacetamide Sodium Other (See Comments)    edema   Prior to Admission medications   Medication Sig Start Date End Date Taking? Authorizing Provider  ALPRAZolam Prudy Feeler) 0.5 MG tablet Take 0.5 mg by mouth at bedtime as needed for sleep.   Yes Historical Provider, MD  amitriptyline (ELAVIL) 50 MG tablet Take 50 mg by mouth at bedtime.   Yes Historical Provider, MD  amLODipine (NORVASC) 10 MG tablet Take 10 mg by mouth daily.   Yes Historical Provider, MD  cetirizine (ZYRTEC) 10 MG tablet Take 10 mg by mouth daily.   Yes Historical Provider, MD  citalopram (CELEXA) 40 MG tablet Take 40 mg by mouth daily.   Yes Historical Provider, MD  clonazePAM (KLONOPIN) 0.5 MG tablet Take 0.5 mg by mouth 2 (two) times daily as needed for anxiety.   Yes Historical Provider, MD  cyclobenzaprine (FLEXERIL) 10 MG tablet Take 10 mg by mouth every 8 (eight) hours as needed. Muscle spasms 11/17/15  Yes Historical Provider, MD  hydrochlorothiazide (HYDRODIURIL) 25 MG tablet Take 25 mg by mouth daily.   Yes Historical Provider, MD  montelukast (SINGULAIR) 10 MG tablet Take 10 mg by mouth daily as needed. allergies 12/25/15  Yes Historical Provider, MD  oxyCODONE (ROXICODONE) 15 MG immediate release tablet Take 15 mg by mouth every 8 (eight) hours as needed. pain 12/24/15  Yes Historical Provider, MD  pantoprazole (PROTONIX) 40 MG tablet Take 40 mg by mouth daily. 10/26/15  Yes Historical Provider, MD  risperiDONE (RISPERDAL) 1 MG tablet Take 1 mg by mouth at bedtime. 10/26/15  Yes Historical Provider, MD   No results found. - pertinent xrays, CT, MRI studies were reviewed and independently interpreted  Positive ROS: All other systems have been reviewed and were  otherwise negative with the exception of those mentioned in the HPI and as above.  Physical Exam: General: Alert, no acute distress Cardiovascular: No pedal edema Respiratory: No cyanosis, no use of accessory musculature GI: No organomegaly, abdomen is soft and non-tender Skin: Large fasciotomy sites both lower extremities with significant muscle loss to the right lower extremity Neurologic: Patient does not have protective sensation Psychiatric: Patient is competent for consent with normal mood and affect Lymphatic: No axillary or cervical lymphadenopathy  MUSCULOSKELETAL:  On examination patient has wound vacs in place on both lower extremities. By report patient has a good granulation tissue to the left lower extremity and has had severe muscle necrosis to the right lower extremity.  Assessment: Assessment: Status post multiple irrigation and debridement for compartment syndrome with muscle and tissue loss to the right lower extremity and open fasciotomies the left lower extremity.  Plan: Plan: We will plan for further irrigation and debridement of the left lower extremity with possible split-thickness skin graft and reapplied a wound VAC. Discussed the patient that his right lower extremity may not be salvageable and may require an above-the-knee amputation. At best patient will proceed with a below the knee amputation on the right.  Thank you for the consult and the opportunity to see [name]  Marcus Duda, MD [facility] [phone] 6:40 AM

## 2016-01-09 NOTE — Progress Notes (Signed)
Nutrition Follow-Up  DOCUMENTATION CODES:   Not applicable  INTERVENTION:   -D/c Ensure Enlive po TID, each supplement provides 350 kcal and 20 grams of protein -D/c MVI daily. Pt maya benefit from renal MVI per MD discretion -Once diet is advanced, add Boost Breeze po TID, each supplement provides 250 kcal and 9 grams of protein  NUTRITION DIAGNOSIS:   Increased nutrient needs related to wound healing as evidenced by estimated needs.  Ongoing  GOAL:   Patient will meet greater than or equal to 90% of their needs  Progressing  MONITOR:   PO intake, Supplement acceptance, Labs, Weight trends, Skin, I & O's  REASON FOR ASSESSMENT:   Malnutrition Screening Tool    ASSESSMENT:   Jeffery Gross is a 55 y.o. male presenting with fall found to have rhabdomyolysis and right DVT. PMH is significant for chronic back pain, HTN, depression and anxiety.   Pt admitted with rhabdomyolysis, DVT, and bilateral lower leg compartment syndrome.   S/p Procedure 12/1515:  1. Bilateral lower leg Four-compartment fasciotomy bilateral lower leg 2. Application of wound vac >50 sq cm  S/p PROCEDURE on 01/05/16 and 01/07/16: 1. Irrigation and debridement of muscle and skin of left lower extremity medial wound 35 cm x 7 cm x 2 cm; lateral wound 15 cm x 5 cm x 1 cm 2. Application of wound VAC greater than 50 cm  Nephrology following. Pt underwent HD cath placement on 01/07/16 and HD was initiated. Pt currently in HD at time of visit.   Pt remains with wound vac to bilateral legs and has undergone several I&D's and wound vac changes since admission. Per orthopedics note, rt lower extremity may not be salvageable and will undergo rt AKA today.   Pt is currently NPO for surgery. Intake has been poor due to lethargy. Noted pt has also been refusing Ensure supplements as they make him "queasy". RD will add Boost Breeze supplement, due to complaints of queasiness and supplement bing more  renal-friendly.   Case discussed with RN.   Labs reviewed: Na: 129, K: 5.4, Phos: 8.5, GFR: 5, BUN/Creat: 88/9.61.   Diet Order:  Diet NPO time specified Diet NPO time specified  Skin:  Wound (see comment) (wound vac bilateral legs)  Last BM:  PTA  Height:   Ht Readings from Last 1 Encounters:  01/04/16 6\' 1"  (1.854 m)    Weight:   Wt Readings from Last 1 Encounters:  01/09/16 250 lb 14.1 oz (113.8 kg)    Ideal Body Weight:  86.4 kg  BMI:  Body mass index is 33.11 kg/(m^2).  Estimated Nutritional Needs:   Kcal:  2200-2400  Protein:  120-135 grams  Fluid:  2.2-2.4 L  EDUCATION NEEDS:   Education needs addressed  Americus Perkey A. Mayford KnifeWilliams, RD, LDN, CDE Pager: [phone] After hours Pager: [phone]

## 2016-01-09 NOTE — Interval H&P Note (Signed)
History and Physical Interval Note:  01/09/2016 6:43 AM  Jeffery Gross  has presented today for surgery, with the diagnosis of Bilateral Compartment Syndrome Legs  The various methods of treatment have been discussed with the patient and family. After consideration of risks, benefits and other options for treatment, the patient has consented to  Procedure(s): Right Below Knee Amputation versus Above Knee Amputation, Apply Wound VAC (Right) Split Thickness Skin Graft and Apply VAC Left Leg (Left) as a surgical intervention .  The patient's history has been reviewed, patient examined, no change in status, stable for surgery.  I have reviewed the patient's chart and labs.  Questions were answered to the patient's satisfaction.     DUDA,MARCUS V

## 2016-01-09 NOTE — Anesthesia Preprocedure Evaluation (Signed)
Anesthesia Evaluation  Patient identified by MRN, date of birth, ID band Patient awake    Reviewed: Allergy & Precautions, NPO status , Patient's Chart, lab work & pertinent test results  Airway Mallampati: II  TM Distance: >3 FB Neck ROM: Full    Dental  (+) Teeth Intact, Dental Advisory Given   Pulmonary Current Smoker,    breath sounds clear to auscultation       Cardiovascular hypertension,  Rhythm:Regular Rate:Normal     Neuro/Psych    GI/Hepatic   Endo/Other    Renal/GU      Musculoskeletal   Abdominal (+) + obese,   Peds  Hematology   Anesthesia Other Findings   Reproductive/Obstetrics                             Anesthesia Physical Anesthesia Plan  ASA: III  Anesthesia Plan: General   Post-op Pain Management:    Induction: Intravenous  Airway Management Planned: Oral ETT  Additional Equipment:   Intra-op Plan:   Post-operative Plan:   Informed Consent: I have reviewed the patients History and Physical, chart, labs and discussed the procedure including the risks, benefits and alternatives for the proposed anesthesia with the patient or authorized representative who has indicated his/her understanding and acceptance.   Dental advisory given  Plan Discussed with: CRNA and Anesthesiologist  Anesthesia Plan Comments:         Anesthesia Quick Evaluation

## 2016-01-09 NOTE — Progress Notes (Signed)
Dr. Noreene LarssonJoslin in at bedside. Pt crying after Dilaudid 2 mg IV. Precedex IV given by Dr. Noreene LarssonJoslin. Pt calmer

## 2016-01-09 NOTE — Procedures (Signed)
Tolerating hemodialysis, preop K 5.4.  For amputation.  WIll try to get some fluid off preop Tolerating treatment. Jeffery Gross C

## 2016-01-09 NOTE — Anesthesia Postprocedure Evaluation (Signed)
Anesthesia Post Note  Patient: Jeffery Gross  Procedure(s) Performed: Procedure(s) (LRB): Bilateral Above Knee Amputation, Apply Wound VAC (Bilateral)  Patient location during evaluation: PACU Anesthesia Type: General Level of consciousness: awake, oriented and awake and alert Pain management: pain level controlled Vital Signs Assessment: post-procedure vital signs reviewed and stable Respiratory status: spontaneous breathing, nonlabored ventilation and respiratory function stable Cardiovascular status: blood pressure returned to baseline Anesthetic complications: no    Last Vitals:  Filed Vitals:   01/09/16 1622 01/09/16 1637  BP: 135/68 144/69  Pulse: 91 88  Temp:  37.2 C  Resp: 20 14    Last Pain:  Filed Vitals:   01/09/16 1721  PainSc: 10-Worst pain ever                 Fabiana Dromgoole COKER

## 2016-01-10 ENCOUNTER — Encounter (HOSPITAL_COMMUNITY): Payer: Self-pay | Admitting: Orthopedic Surgery

## 2016-01-10 ENCOUNTER — Inpatient Hospital Stay (HOSPITAL_COMMUNITY): Payer: BLUE CROSS/BLUE SHIELD

## 2016-01-10 DIAGNOSIS — R1084 Generalized abdominal pain: Secondary | ICD-10-CM

## 2016-01-10 LAB — RENAL FUNCTION PANEL
Albumin: 1.3 g/dL — ABNORMAL LOW (ref 3.5–5.0)
Anion gap: 12 (ref 5–15)
BUN: 54 mg/dL — AB (ref 6–20)
CHLORIDE: 94 mmol/L — AB (ref 101–111)
CO2: 25 mmol/L (ref 22–32)
CREATININE: 7.47 mg/dL — AB (ref 0.61–1.24)
Calcium: 7.4 mg/dL — ABNORMAL LOW (ref 8.9–10.3)
GFR calc Af Amer: 9 mL/min — ABNORMAL LOW (ref >60)
GFR, EST NON AFRICAN AMERICAN: 7 mL/min — AB (ref >60)
Glucose, Bld: 94 mg/dL (ref 65–99)
Phosphorus: 8.1 mg/dL — ABNORMAL HIGH (ref 2.5–4.6)
Potassium: 4.7 mmol/L (ref 3.5–5.1)
Sodium: 131 mmol/L — ABNORMAL LOW (ref 135–145)

## 2016-01-10 LAB — BLOOD PRODUCT ORDER (VERBAL) VERIFICATION

## 2016-01-10 LAB — CBC
HCT: 22.6 % — ABNORMAL LOW (ref 39.0–52.0)
Hemoglobin: 7.7 g/dL — ABNORMAL LOW (ref 13.0–17.0)
MCH: 28.1 pg (ref 26.0–34.0)
MCHC: 34.1 g/dL (ref 30.0–36.0)
MCV: 82.5 fL (ref 78.0–100.0)
PLATELETS: 253 10*3/uL (ref 150–400)
RBC: 2.74 MIL/uL — ABNORMAL LOW (ref 4.22–5.81)
RDW: 13.4 % (ref 11.5–15.5)
WBC: 10.7 10*3/uL — AB (ref 4.0–10.5)

## 2016-01-10 LAB — CK: CK TOTAL: 3921 U/L — AB (ref 49–397)

## 2016-01-10 LAB — HEMOGLOBIN AND HEMATOCRIT, BLOOD
HEMATOCRIT: 22.7 % — AB (ref 39.0–52.0)
Hemoglobin: 7.7 g/dL — ABNORMAL LOW (ref 13.0–17.0)

## 2016-01-10 MED ORDER — FENTANYL CITRATE (PF) 100 MCG/2ML IJ SOLN
75.0000 ug | INTRAMUSCULAR | Status: DC | PRN
  Administered 2016-01-16 – 2016-01-21 (×9): 75 ug via INTRAVENOUS
  Filled 2016-01-10 (×8): qty 2

## 2016-01-10 MED ORDER — GABAPENTIN 600 MG PO TABS
300.0000 mg | ORAL_TABLET | Freq: Every day | ORAL | Status: DC
  Administered 2016-01-10 – 2016-01-19 (×10): 300 mg via ORAL
  Filled 2016-01-10 (×11): qty 1

## 2016-01-10 MED ORDER — POLYETHYLENE GLYCOL 3350 17 G PO PACK
17.0000 g | PACK | Freq: Every day | ORAL | Status: DC
  Administered 2016-01-10 – 2016-01-12 (×3): 17 g via ORAL
  Filled 2016-01-10 (×2): qty 1

## 2016-01-10 NOTE — Progress Notes (Signed)
Assessment/Plan: 55 year old white male with hypertension and chronic pain who presents with rhabdo after his fall and immobility in the setting of narcotics and Klonopin 1. Oliguric AKIdue to ATN due to rhabdomyolysis after fall and staying down. Dialysis requiring.  May benefit from LTAC, not OP HD appropriate. HD Friday. 2. Hypertension/volume   3. Anemia  4. S/P Bilat AKAs  Subjective: Interval History: s/p bilat AKAs yesterday  Objective: Vital signs in last 24 hours: Temp:  [97 F (36.1 C)-98.9 F (37.2 C)] 97.5 F (36.4 C) (06/22 0752) Pulse Rate:  [47-109] 93 (06/22 0752) Resp:  [11-22] 20 (06/22 0752) BP: (97-147)/(56-84) 129/65 mmHg (06/22 0752) SpO2:  [90 %-100 %] 95 % (06/22 0752) Weight:  [95.1 kg (209 lb 10.5 oz)-109.7 kg (241 lb 13.5 oz)] 95.1 kg (209 lb 10.5 oz) (06/22 0343) Weight change:   Intake/Output from previous day: 06/21 0701 - 06/22 0700 In: 1844.8 [P.O.:60; I.V.:803.8; Blood:981] Out: 5000 [Urine:100; Blood:1000] Intake/Output this shift:    General appearance: alert, cooperative and moderate distress GI: soft, non-tender; bowel sounds normal; no masses,  no organomegaly Extremities: bilat AKAs  Lab Results:  Recent Labs  01/09/16 0320 01/09/16 1251 01/10/16 0300  WBC 12.3*  --  10.7*  HGB 9.1* 7.8* 7.7*  HCT 27.1* 23.0* 22.6*  PLT 201  --  253   BMET:  Recent Labs  01/09/16 0320 01/09/16 1251 01/10/16 0300  NA 129* 131* 131*  K 5.4* 4.5 4.7  CL 88*  --  94*  CO2 25  --  25  GLUCOSE 97 95 94  BUN 88*  --  54*  CREATININE 9.61*  --  7.47*  CALCIUM 7.1*  --  7.4*   No results for input(s): PTH in the last 72 hours. Iron Studies: No results for input(s): IRON, TIBC, TRANSFERRIN, FERRITIN in the last 72 hours. Studies/Results: No results found.  Scheduled: . amitriptyline  50 mg Oral QHS  . citalopram  40 mg Oral Daily  . pantoprazole  40 mg Oral Daily  . risperiDONE  1 mg Oral QHS  . senna-docusate  1 tablet Oral BID   . sodium chloride flush  10-40 mL Intracatheter Q12H    LOS: 7 days   Cohen Doleman C 01/10/2016,10:00 AM  -
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
## 2016-01-10 NOTE — Progress Notes (Signed)
Inpatient Rehabilitation  Per OT/PT request, patient was screened by Fae PippinMelissa Ventura Hollenbeck for appropriateness for an Inpatient Acute Rehab consult.  At this time we are planning to follow along for increased therapy participation/tolerance as well as renal MD's determination of acute versus chronic HD needs.  Please call with questions.      Charlane FerrettiMelissa Freddie Dymek, M.A., CCC/SLP Admission Coordinator  [facility]  Cell [phone]

## 2016-01-10 NOTE — Progress Notes (Signed)
Late entry due to floor events  Post Op rounding  Patient was still in PACU and very sleepy, but awakened easily. He was getting precedex for anxiety in the PACU  BP 134/71 mmHg  Pulse 98  Temp(Src) 98.2 F (36.8 C) (Oral)  Resp 18  Ht 6\' 1"  (1.854 m)  Wt 209 lb 10.5 oz (95.1 kg)  BMI 27.67 kg/m2  SpO2 95%  Exam:  Gen: somnolent Pulm/CV- CTAB/RRR Extremities- s/p bilateral AKAs  A/P 55 y/o M presenting with rhabdomyolysis, compartment syndrome and found to be in acute renal failure - Post Op- will continue fentanyl dilaudid IV, PO oxycodone - given anxiety in PACU antivan PRN started - will follow post op labs - ADAT   Deseray Daponte A. Kennon RoundsHaney MD, MS Family Medicine Resident PGY-2 Pager [phone]

## 2016-01-10 NOTE — Evaluation (Signed)
Physical Therapy Evaluation Patient Details Name: Jeffery Gross MRN: [id number] DOB: February 09, 1961 Today's Date: 01/10/2016   History of Present Illness  Jeffery Gross is a 55 y.o. 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 PMH is significant for chronic back pain, back surgery, HTN, depression and anxiety.     Clinical Impression  Patient is s/p above surgery resulting in functional limitations due to the deficits listed below (see PT Problem List). Patient should progress well once pain control improves and anxiety decreases. Patient will benefit from skilled PT to increase their independence and safety with mobility to allow discharge to the venue listed below.       Follow Up Recommendations CIR    Equipment Recommendations  Other (comment) (TBA)    Recommendations for Other Services Rehab consult     Precautions / Restrictions Precautions Precautions: Fall      Mobility  Bed Mobility Overal bed mobility: Needs Assistance;+2 for physical assistance;+ 2 for safety/equipment Bed Mobility: Rolling;Sidelying to Sit;Sit to Supine Rolling: Max assist;+2 for physical assistance;+2 for safety/equipment Sidelying to sit: Max assist;+2 for physical assistance;+2 for safety/equipment;HOB elevated   Sit to supine: Mod assist;+2 for physical assistance;HOB elevated;+2 for safety/equipment   General bed mobility comments: initiated elevating HOB, pt could not tolerate full elevation; pt able to reach across to partial roll and use of bed pad under pelvis to rotate and sit EOB; pt controlled upper body to return to supine  Transfers                    Ambulation/Gait                Stairs            Wheelchair Mobility    Modified Rankin (Stroke Patients Only)        Balance Overall balance assessment: Needs assistance Sitting-balance support: Bilateral upper extremity supported Sitting balance-Leahy Scale: Poor Sitting balance - Comments: pt with hands behind torso and posterior lean due to pain of residual limbs Postural control: Posterior lean                                   Pertinent Vitals/Pain Pain Assessment: 0-10 Pain Score: 10-Worst pain ever Pain Location: bil legs Pain Descriptors / Indicators: Grimacing;Operative site guarding Pain Intervention(s): Limited activity within patient's tolerance;Monitored during session;Premedicated before session;Repositioned;Relaxation    Home Living Family/patient expects to be discharged to:: Unsure Living Arrangements: Non-relatives/Friends               Additional Comments: Patient unsure where he will go; one home has 17 steps to enter    Prior Function Level of Independence: Independent         Comments: Economistindustrial plumber     Hand Dominance   Dominant Hand: Right    Extremity/Trunk Assessment   Upper Extremity Assessment: Defer to OT evaluation;Overall WFL for tasks assessed           Lower Extremity Assessment: RLE deficits/detail;LLE deficits/detail RLE Deficits / Details: edematous, VAC dressing along end of residual limb; pt unable to actively flex hip/raise leg due to pain LLE Deficits / Details: edematous, VAC dressing along end of residual limb; pt unable to actively flex hip/raise leg due to  pain  Cervical / Trunk Assessment: Normal  Communication   Communication: No difficulties  Cognition Arousal/Alertness: Lethargic;Suspect due to medications Behavior During Therapy: Anxious Overall Cognitive Status: Within Functional Limits for tasks assessed                      General Comments General comments (skin integrity, edema, etc.): Good sense of humor and incr anxiety related to anticipated pain. Distraction partially successful in  achieving EOB    Exercises        Assessment/Plan    PT Assessment Patient needs continued PT services  PT Diagnosis Acute pain;Other (comment) (new bil AKA)   PT Problem List Decreased range of motion;Decreased activity tolerance;Decreased balance;Decreased mobility;Decreased knowledge of use of DME;Obesity;Pain;Decreased skin integrity  PT Treatment Interventions DME instruction;Functional mobility training;Therapeutic activities;Therapeutic exercise;Balance training;Patient/family education   PT Goals (Current goals can be found in the Care Plan section) Acute Rehab PT Goals Patient Stated Goal: pain relief PT Goal Formulation: With patient Time For Goal Achievement: 01/24/16 Potential to Achieve Goals: Good    Frequency Min 3X/week   Barriers to discharge Decreased caregiver support      Co-evaluation PT/OT/SLP Co-Evaluation/Treatment: Yes Reason for Co-Treatment: Complexity of the patient's impairments (multi-system involvement);For patient/therapist safety           End of Session   Activity Tolerance: Patient limited by pain Patient left: in bed;with call bell/phone within reach Nurse Communication: Mobility status         Time: 1122-1200 PT Time Calculation (min) (ACUTE ONLY): 38 min   Charges:   PT Evaluation $PT Eval Low Complexity: 1 Procedure PT Treatments $Therapeutic Activity: 8-22 mins   PT G Codes:        Anahlia Iseminger 01/10/2016, 12:23 PM Pager [phone]

## 2016-01-10 NOTE — Evaluation (Signed)
Occupational Therapy Evaluation Patient Details Name: Jeffery Gross MRN: [id number] DOB: Jan 18, 1961 Today's Date: 01/10/2016    History of Present Illness Jeffery Gross is a 55 y.o. 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 PMH is significant for chronic back pain, back surgery, HTN, depression and anxiety.    Clinical Impression   Pt was independent prior to admission. Present with increased pain B LEs, decreased activity tolerance, poor sitting balance, anxiety and generalized weakness. He is dependent in bathing, dressing and toileting and requires 2 person assist for bed level mobility. He was able to tolerate 3 minutes supported at EOB. Will follow acutely.    Follow Up Recommendations  CIR    Equipment Recommendations  3 in 1 bedside comode;Wheelchair (measurements OT);Wheelchair cushion (measurements OT);Tub/shower seat (drop arm)    Recommendations for Other Services       Precautions / Restrictions Precautions Precautions: Fall Precaution Comments: B LE wound vacs Restrictions Weight Bearing Restrictions: No      Mobility Bed Mobility Overal bed mobility: Needs Assistance;+2 for physical assistance;+ 2 for safety/equipment Bed Mobility: Rolling;Sidelying to Sit;Sit to Supine Rolling: Max assist;+2 for physical assistance;+2 for safety/equipment Sidelying to sit: Max assist;+2 for physical assistance;+2 for safety/equipment;HOB elevated   Sit to supine: Mod assist;+2 for physical assistance;HOB elevated;+2 for safety/equipment   General bed mobility comments: initiated elevating HOB, pt could not tolerate full elevation; pt able to reach across to partial roll and use of bed pad under pelvis to rotate and sit EOB; pt controlled upper body to return to supine  Transfers                       Balance Overall balance assessment: Needs assistance Sitting-balance support: Bilateral upper extremity supported Sitting balance-Leahy Scale: Poor Sitting balance - Comments: pt with hands behind torso and posterior lean due to pain of residual limbs Postural control: Posterior lean                                  ADL Overall ADL's : Needs assistance/impaired Eating/Feeding: Set up   Grooming: Wash/dry hands;Wash/dry face;Bed level;Minimal assistance   Upper Body Bathing: Total assistance;Sitting   Lower Body Bathing: Total assistance;+2 for physical assistance;Bed level   Upper Body Dressing : Moderate assistance;Bed level   Lower Body Dressing: +2 for physical assistance;Total assistance;Bed level                       Vision     Perception     Praxis      Pertinent Vitals/Pain Pain Assessment: 0-10 Pain Score: 10-Worst pain ever Pain Location: B LEs Pain Descriptors / Indicators: Crying;Grimacing;Guarding;Operative site guarding Pain Intervention(s): Repositioned;Premedicated before session;Monitored during session;Limited activity within patient's tolerance     Hand Dominance Right   Extremity/Trunk Assessment Upper Extremity Assessment Upper Extremity Assessment: Overall WFL for tasks assessed (good strength, poor endurance)   Lower Extremity Assessment Lower Extremity Assessment: Defer to PT evaluation RLE Deficits / Details: edematous, VAC dressing along end of residual limb; pt unable to actively flex hip/raise leg due to pain RLE: Unable to fully assess due to pain LLE Deficits / Details: edematous, VAC dressing along end of residual limb; pt unable to actively  flex hip/raise leg due to pain   Cervical / Trunk Assessment Cervical / Trunk Assessment: Normal   Communication Communication Communication: No difficulties   Cognition Arousal/Alertness: Lethargic;Suspect due to medications Behavior During  Therapy: Anxious Overall Cognitive Status: Within Functional Limits for tasks assessed                     General Comments       Exercises       Shoulder Instructions      Home Living Family/patient expects to be discharged to:: Unsure Living Arrangements: Non-relatives/Friends                               Additional Comments: Patient unsure where he will go; one home has 17 steps to enter      Prior Functioning/Environment Level of Independence: Independent        Comments: industrial plumber    OT Diagnosis: Generalized weakness;Acute pain (B LE amputations)   OT Problem List: Decreased strength;Decreased activity tolerance;Impaired balance (sitting and/or standing);Decreased knowledge of use of DME or AE;Obesity;Pain   OT Treatment/Interventions: Self-care/ADL training;DME and/or AE instruction;Patient/family education;Therapeutic exercise;Therapeutic activities;Balance training    OT Goals(Current goals can be found in the care plan section) Acute Rehab OT Goals Patient Stated Goal: pain relief OT Goal Formulation: With patient Time For Goal Achievement: 01/24/16 Potential to Achieve Goals: Good ADL Goals Pt Will Perform Grooming: with supervision;sitting Pt Will Perform Upper Body Bathing: with min assist;sitting Pt Will Perform Upper Body Dressing: with supervision;sitting Pt Will Transfer to Toilet: with min assist;with +2 assist;anterior/posterior transfer;bedside commode Pt/caregiver will Perform Home Exercise Program: Both right and left upper extremity;With theraband;Independently (to increase endurance, level 3) Additional ADL Goal #1: Pt will perform bed mobility with min assist in preparation for ADL. Additional ADL Goal #2: Pt will sit EOB x 15 minutes with supervision as a precursor to ADL.  OT Frequency: Min 2X/week   Barriers to D/C: Inaccessible home environment;Decreased caregiver support  pt is "between homes"        Co-evaluation PT/OT/SLP Co-Evaluation/Treatment: Yes Reason for Co-Treatment: For patient/therapist safety;Complexity of the patient's impairments (multi-system involvement)   OT goals addressed during session: Strengthening/ROM      End of Session Nurse Communication: Mobility status  Activity Tolerance: Patient limited by pain Patient left: in bed;with call bell/phone within reach   Time: 1122-1159 OT Time Calculation (min): 37 min Charges:  OT General Charges $OT Visit: 1 Procedure OT Evaluation $OT Eval Moderate Complexity: 1 Procedure G-Codes:    Evern BioMayberry, Basem Yannuzzi Lynn 01/10/2016, 2:53 PM [phone]

## 2016-01-10 NOTE — Progress Notes (Signed)
Patient ID: Jeffery Gross, male   DOB: 03/12/1961, 55 y.o.   MRN: [id number] Postoperative day 1 bilateral above-the-knee amputations due to massive necrotic gangrenous changes of all muscle groups in both legs.  The wound vacs have very minimal amount of drainage.  The source of the patient's pain has been removed, his renal function should improve rapidly, and would recommend aggressively weaning off the IV pain medication.  Plan for discharge to skilled nursing. Plan for discharge with portable Prevena wound vacs.

## 2016-01-11 DIAGNOSIS — Z452 Encounter for adjustment and management of vascular access device: Secondary | ICD-10-CM | POA: Insufficient documentation

## 2016-01-11 LAB — RENAL FUNCTION PANEL
ALBUMIN: 1.3 g/dL — AB (ref 3.5–5.0)
Anion gap: 11 (ref 5–15)
BUN: 72 mg/dL — AB (ref 6–20)
CO2: 27 mmol/L (ref 22–32)
Calcium: 7.4 mg/dL — ABNORMAL LOW (ref 8.9–10.3)
Chloride: 94 mmol/L — ABNORMAL LOW (ref 101–111)
Creatinine, Ser: 9.93 mg/dL — ABNORMAL HIGH (ref 0.61–1.24)
GFR calc Af Amer: 6 mL/min — ABNORMAL LOW (ref >60)
GFR calc non Af Amer: 5 mL/min — ABNORMAL LOW (ref >60)
GLUCOSE: 105 mg/dL — AB (ref 65–99)
POTASSIUM: 5 mmol/L (ref 3.5–5.1)
Phosphorus: 9.3 mg/dL — ABNORMAL HIGH (ref 2.5–4.6)
SODIUM: 132 mmol/L — AB (ref 135–145)

## 2016-01-11 LAB — CBC
HCT: 21.2 % — ABNORMAL LOW (ref 39.0–52.0)
Hemoglobin: 7.2 g/dL — ABNORMAL LOW (ref 13.0–17.0)
MCH: 28.7 pg (ref 26.0–34.0)
MCHC: 34 g/dL (ref 30.0–36.0)
MCV: 84.5 fL (ref 78.0–100.0)
Platelets: 249 10*3/uL (ref 150–400)
RBC: 2.51 MIL/uL — ABNORMAL LOW (ref 4.22–5.81)
RDW: 13.5 % (ref 11.5–15.5)
WBC: 11.6 10*3/uL — ABNORMAL HIGH (ref 4.0–10.5)

## 2016-01-11 LAB — GLUCOSE, CAPILLARY
Glucose-Capillary: 134 mg/dL — ABNORMAL HIGH (ref 65–99)
Glucose-Capillary: 136 mg/dL — ABNORMAL HIGH (ref 65–99)

## 2016-01-11 MED ORDER — ALTEPLASE 2 MG IJ SOLR
2.0000 mg | Freq: Once | INTRAMUSCULAR | Status: AC
  Administered 2016-01-11: 2 mg

## 2016-01-11 MED ORDER — ASPIRIN 81 MG PO CHEW
81.0000 mg | CHEWABLE_TABLET | Freq: Every day | ORAL | Status: DC
  Administered 2016-01-12 – 2016-01-15 (×4): 81 mg via ORAL
  Filled 2016-01-11 (×4): qty 1

## 2016-01-11 MED ORDER — ALTEPLASE 2 MG IJ SOLR
INTRAMUSCULAR | Status: AC
  Filled 2016-01-11: qty 4

## 2016-01-11 NOTE — Progress Notes (Signed)
Physical Therapy Treatment Patient Details Name: Jeffery HarbourScott D Caraveo MRN: [id number] DOB: September 05, 1960 Today's Date: 01/11/2016    History of Present Illness Jeffery Gross is a 55 y.o. 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 PMH is significant for chronic back pain, back surgery, HTN, depression and anxiety.     PT Comments    Pt admitted with above diagnosis. Pt currently with functional limitations due to balance and endurance deficits. Pt was able to tolerate anterior postterior transfer with +2 assist.  Had some pain but did better than yesterday. Continue to recommend Rehab.  Pt will benefit from skilled PT to increase their independence and safety with mobility to allow discharge to the venue listed below.    Follow Up Recommendations  CIR     Equipment Recommendations  Other (comment) (TBA)    Recommendations for Other Services Rehab consult     Precautions / Restrictions Precautions Precautions: Fall Precaution Comments: B LE wound vacs Restrictions Weight Bearing Restrictions: No    Mobility  Bed Mobility Overal bed mobility: Needs Assistance;+2 for physical assistance;+ 2 for safety/equipment Bed Mobility: Supine to Sit     Supine to sit: +2 for physical assistance;Max assist;HOB elevated     General bed mobility comments: HOB up in sitting position, pt used his R UE to pull up on R rail, assisted with pad to rotate hips and perform AP transfer to chair  Transfers Overall transfer level: Needs assistance   Transfers: Licensed conveyancerAnterior-Posterior Transfer       Anterior-Posterior transfers: +2 physical assistance;Max assist   General transfer comment: left amputee lift pad and pillow under pt  Ambulation/Gait                 Stairs            Wheelchair Mobility     Modified Rankin (Stroke Patients Only)       Balance Overall balance assessment: Needs assistance Sitting-balance support: Bilateral upper extremity supported Sitting balance-Leahy Scale: Poor Sitting balance - Comments: posterior lean to minimize weight on scrotum and LEs due to pain Postural control: Posterior lean                          Cognition Arousal/Alertness: Awake/alert Behavior During Therapy: Anxious Overall Cognitive Status: Within Functional Limits for tasks assessed                      Exercises      General Comments General comments (skin integrity, edema, etc.): Pt continues to use humor as distraction of pain.       Pertinent Vitals/Pain Pain Assessment: Faces Faces Pain Scale: Hurts whole lot Pain Location: LEs with transfer Pain Descriptors / Indicators: Moaning;Grimacing;Guarding Pain Intervention(s): Limited activity within patient's tolerance;Monitored during session;Premedicated before session;Repositioned  VSS    Home Living                      Prior Function            PT Goals (current goals can now be found in the care plan section) Acute Rehab PT Goals Patient Stated Goal: pain relief Progress towards PT goals: Progressing toward goals    Frequency  Min 3X/week    PT Plan Current plan remains appropriate  Co-evaluation PT/OT/SLP Co-Evaluation/Treatment: Yes Reason for Co-Treatment: Complexity of the patient's impairments (multi-system involvement) PT goals addressed during session: Mobility/safety with mobility OT goals addressed during session: ADL's and self-care     End of Session   Activity Tolerance: Patient limited by pain Patient left: in chair;with call bell/phone within reach     Time: [id number] PT Time Calculation (min) (ACUTE ONLY): 24 min  Charges:  $Therapeutic Activity: 8-22 mins                    G CodesTawni Gross:      Jeffery Gross 01/11/2016, 12:52 PM Jeffery Gross,PT Acute  Rehabilitation [phone] (pager)

## 2016-01-11 NOTE — Progress Notes (Signed)
Expand All Collapse All   Assessment/Plan: 55 year old white male with hypertension and chronic pain who presents with rhabdo after his fall and immobility in the setting of narcotics and Klonopin 1. Oliguric AKIdue to ATN due to rhabdomyolysis after fall and staying down. Dialysis requiring. May benefit from LTAC, not OP HD appropriate at this time. HD Friday, today. 2. Hypertension/volume  3. Anemia  4. S/P Bilat AKAs     Subjective: Interval History: Nothing new Objective: Vital signs in last 24 hours: Temp:  [97.6 F (36.4 C)-99 F (37.2 C)] 97.7 F (36.5 C) (06/23 0555) Pulse Rate:  [86-111] 86 (06/23 0555) Resp:  [16-20] 18 (06/23 0555) BP: (118-148)/(68-88) 134/80 mmHg (06/23 0555) SpO2:  [89 %-100 %] 96 % (06/23 0555) Weight:  [94.9 kg (209 lb 3.5 oz)] 94.9 kg (209 lb 3.5 oz) (06/23 0040) Weight change: -18.9 kg (-41 lb 10.7 oz)  Intake/Output from previous day: 06/22 0701 - 06/23 0700 In: 1260 [P.O.:1260] Out: 425 [Urine:375; Drains:50] Intake/Output this shift:    General appearance: alert GI: soft, non-tender; bowel sounds normal; no masses,  no organomegaly Extremities: bilat AKAs  Lab Results:  Recent Labs  01/10/16 0300 01/10/16 1737 01/11/16 0530  WBC 10.7*  --  11.6*  HGB 7.7* 7.7* 7.2*  HCT 22.6* 22.7* 21.2*  PLT 253  --  249   BMET:  Recent Labs  01/10/16 0300 01/11/16 0530  NA 131* 132*  K 4.7 5.0  CL 94* 94*  CO2 25 27  GLUCOSE 94 105*  BUN 54* 72*  CREATININE 7.47* 9.93*  CALCIUM 7.4* 7.4*   No results for input(s): PTH in the last 72 hours. Iron Studies: No results for input(s): IRON, TIBC, TRANSFERRIN, FERRITIN in the last 72 hours. Studies/Results: Dg Abd 1 View  01/10/2016  CLINICAL DATA:  Abdominal pain generalized. EXAM: ABDOMEN - 1 VIEW COMPARISON:  CT 01/03/2016 FINDINGS: Bowel gas pattern is nonobstructive. There is mild fecal retention throughout the colon. No free peritoneal air. No mass or mass effect. Mild  degenerate change of the spine. Fusion hardware intact over the approximate L5-S1 level. IMPRESSION: Nonobstructive bowel gas pattern. Electronically Signed   By: Elberta Fortisaniel  Boyle M.D.   On: 01/10/2016 13:46    Scheduled: . amitriptyline  50 mg Oral QHS  . citalopram  40 mg Oral Daily  . gabapentin  300 mg Oral Daily  . pantoprazole  40 mg Oral Daily  . polyethylene glycol  17 g Oral Daily  . risperiDONE  1 mg Oral QHS  . senna-docusate  1 tablet Oral BID  . sodium chloride flush  10-40 mL Intracatheter Q12H    LOS: 8 days   Talesha Ellithorpe C 01/11/2016,9:37 AM

## 2016-01-11 NOTE — Progress Notes (Signed)
Occupational Therapy Treatment Patient Details Name: Jeffery Gross MRN: [id number] DOB: Dec 31, 1960 Today's Date: 01/11/2016    History of present illness Jeffery Gross is a 55 y.o. 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 PMH is significant for chronic back pain, back surgery, HTN, depression and anxiety.    OT comments  Pt with improved tolerance of activity. Able to perform AP transfer to chair with +2 max assist. Pt also performed UB dressing and grooming with min assist in recliner. Will continue to follow.  Follow Up Recommendations  CIR    Equipment Recommendations  Wheelchair (measurements OT);Wheelchair cushion (measurements OT);Tub/shower seat (drop arm commode)    Recommendations for Other Services      Precautions / Restrictions Precautions Precautions: Fall Precaution Comments: B LE wound vacs       Mobility Bed Mobility Overal bed mobility: Needs Assistance;+2 for physical assistance;+ 2 for safety/equipment Bed Mobility: Supine to Sit     Supine to sit: +2 for physical assistance;Max assist;HOB elevated     General bed mobility comments: HOB up in sitting position, pt used his R UE to pull up on R rail, assisted with pad to rotate hips and perform AP transfer to chair  Transfers Overall transfer level: Needs assistance   Transfers: Licensed conveyancerAnterior-Posterior Transfer       Anterior-Posterior transfers: +2 physical assistance;Max assist   General transfer comment: left amputee lift pad and pillow under pt    Balance     Sitting balance-Leahy Scale: Poor Sitting balance - Comments: posterior lean to minimize weight on scrotum and LEs due to pain                           ADL Overall ADL's : Needs assistance/impaired     Grooming: Wash/dry hands;Wash/dry  face;Brushing hair;Set up;Sitting (shampoo cap, supported sitting)           Upper Body Dressing : Minimal assistance;Sitting                            Vision                     Perception     Praxis      Cognition   Behavior During Therapy: Anxious Overall Cognitive Status: Within Functional Limits for tasks assessed                       Extremity/Trunk Assessment               Exercises     Shoulder Instructions       General Comments      Pertinent Vitals/ Pain       Pain Assessment: Faces Faces Pain Scale: Hurts whole lot Pain Location: LEs with transfer Pain Descriptors / Indicators: Moaning;Grimacing;Guarding Pain Intervention(s): Limited activity within patient's tolerance;Monitored during session;Premedicated before session;Repositioned;Relaxation  Home Living                                          Prior Functioning/Environment              Frequency Min 2X/week  Progress Toward Goals  OT Goals(current goals can now be found in the care plan section)  Progress towards OT goals: Progressing toward goals  Acute Rehab OT Goals Patient Stated Goal: pain relief Time For Goal Achievement: 01/24/16 Potential to Achieve Goals: Good  Plan Discharge plan remains appropriate    Co-evaluation    PT/OT/SLP Co-Evaluation/Treatment: Yes Reason for Co-Treatment: For patient/therapist safety   OT goals addressed during session: ADL's and self-care      End of Session     Activity Tolerance Patient tolerated treatment well   Patient Left in chair;with call bell/phone within reach;with family/visitor present   Nurse Communication Need for lift equipment (nurse premedicated pt)        Time: [id number] OT Time Calculation (min): 27 min  Charges: OT General Charges $OT Visit: 1 Procedure OT Treatments $Self Care/Home Management : 8-22 mins  Evern BioMayberry, Maris Bena Lynn 01/11/2016, 11:53 AM   [phone]
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
## 2016-01-11 NOTE — Progress Notes (Signed)
Received patient from 2H,patient asleep but easily arousable with verbal stimuli. Patient with surgical wound vac on left and right thigh stump. Scrotum very edematous,MASD noted on posterior side. Foley catheter in place. Stage 2 pressure injury on right and left buttock/thigh area. Moisture barrier applied prn for incontinence. Will continue to monitor patient. Natalie Leclaire, Drinda Buttsharito Joselita, RCharity fundraiser

## 2016-01-12 DIAGNOSIS — W19XXXD Unspecified fall, subsequent encounter: Secondary | ICD-10-CM

## 2016-01-12 DIAGNOSIS — L8990 Pressure ulcer of unspecified site, unspecified stage: Secondary | ICD-10-CM | POA: Insufficient documentation

## 2016-01-12 DIAGNOSIS — W19XXXA Unspecified fall, initial encounter: Secondary | ICD-10-CM | POA: Insufficient documentation

## 2016-01-12 LAB — RENAL FUNCTION PANEL
ALBUMIN: 1.2 g/dL — AB (ref 3.5–5.0)
ANION GAP: 7 (ref 5–15)
BUN: 44 mg/dL — ABNORMAL HIGH (ref 6–20)
CALCIUM: 7 mg/dL — AB (ref 8.9–10.3)
CO2: 28 mmol/L (ref 22–32)
CREATININE: 6.76 mg/dL — AB (ref 0.61–1.24)
Chloride: 97 mmol/L — ABNORMAL LOW (ref 101–111)
GFR calc non Af Amer: 8 mL/min — ABNORMAL LOW (ref >60)
GFR, EST AFRICAN AMERICAN: 10 mL/min — AB (ref >60)
GLUCOSE: 103 mg/dL — AB (ref 65–99)
PHOSPHORUS: 6.6 mg/dL — AB (ref 2.5–4.6)
Potassium: 4.6 mmol/L (ref 3.5–5.1)
SODIUM: 132 mmol/L — AB (ref 135–145)

## 2016-01-12 LAB — PREPARE RBC (CROSSMATCH)

## 2016-01-12 LAB — TYPE AND SCREEN
ABO/RH(D): O POS
ANTIBODY SCREEN: NEGATIVE

## 2016-01-12 LAB — HEMOGLOBIN AND HEMATOCRIT, BLOOD
HEMATOCRIT: 24.2 % — AB (ref 39.0–52.0)
HEMOGLOBIN: 8 g/dL — AB (ref 13.0–17.0)

## 2016-01-12 LAB — CBC
HCT: 18.6 % — ABNORMAL LOW (ref 39.0–52.0)
Hemoglobin: 6.2 g/dL — CL (ref 13.0–17.0)
MCH: 27.7 pg (ref 26.0–34.0)
MCHC: 32.8 g/dL (ref 30.0–36.0)
MCV: 84.5 fL (ref 78.0–100.0)
PLATELETS: 239 10*3/uL (ref 150–400)
RBC: 2.2 MIL/uL — ABNORMAL LOW (ref 4.22–5.81)
RDW: 13.7 % (ref 11.5–15.5)
WBC: 12.1 10*3/uL — AB (ref 4.0–10.5)

## 2016-01-12 LAB — GLUCOSE, CAPILLARY: Glucose-Capillary: 157 mg/dL — ABNORMAL HIGH (ref 65–99)

## 2016-01-12 MED ORDER — CITALOPRAM HYDROBROMIDE 40 MG PO TABS
40.0000 mg | ORAL_TABLET | Freq: Every day | ORAL | Status: DC
  Administered 2016-01-13 – 2016-01-22 (×10): 40 mg via ORAL
  Filled 2016-01-12 (×10): qty 1

## 2016-01-12 MED ORDER — SODIUM CHLORIDE 0.9 % IV SOLN: Freq: Once | INTRAVENOUS | Status: DC

## 2016-01-12 MED ORDER — HEPARIN SODIUM (PORCINE) 5000 UNIT/ML IJ SOLN
5000.0000 [IU] | Freq: Three times a day (TID) | INTRAMUSCULAR | Status: DC
  Administered 2016-01-13 – 2016-01-17 (×9): 5000 [IU] via SUBCUTANEOUS
  Filled 2016-01-12 (×9): qty 1

## 2016-01-12 MED ORDER — OXYCODONE HCL 5 MG PO TABS
ORAL_TABLET | ORAL | Status: AC
  Administered 2016-01-12: 15 mg
  Filled 2016-01-12: qty 3

## 2016-01-12 NOTE — Progress Notes (Signed)
Assessment/Plan: 55 year old white male with hypertension and chronic pain who presents with rhabdo after his fall and immobility in the setting of narcotics and Klonopin 1. Oliguric AKIdue to ATN due to rhabdomyolysis after fall and staying down. Dialysis requiring, last HD Friday 2. Hypertension/volume  3. Anemia Will  transfuse 4. S/P Bilat AKAs, for Rehab 5 Scrotal swelling, due to severe 3rd spacing of fluid.  Scrotal elevation, dialysis, may need to ask GU for help  Subjective: C/o scrotal swelling  Objective: Vital signs in last 24 hours: Temp:  [98.3 F (36.8 C)-98.7 F (37.1 C)] 98.5 F (36.9 C) (06/24 0901) Pulse Rate:  [74-98] 84 (06/24 0901) Resp:  [12-21] 20 (06/24 0901) BP: (103-139)/(61-82) 127/61 mmHg (06/24 0901) SpO2:  [95 %-100 %] 100 % (06/24 0901) Weight:  [98.5 kg (217 lb 2.5 oz)-99.5 kg (219 lb 5.7 oz)] 98.5 kg (217 lb 2.5 oz) (06/23 1938) Weight change: 4.6 kg (10 lb 2.3 oz)  Intake/Output from previous day: 06/23 0701 - 06/24 0700 In: 840 [P.O.:840] Out: 1475 [Urine:475] Intake/Output this shift: Total I/O In: 360 [P.O.:360] Out: -   General appearance: alert and cooperative Male genitalia: marked scotal swelling Extremities: Bilat AKAs with wound vac  Lab Results:  Recent Labs  01/11/16 0530 01/12/16 0453  WBC 11.6* 12.1*  HGB 7.2* 6.2*  HCT 21.2* 18.6*  PLT 249 239   BMET:  Recent Labs  01/11/16 0530 01/12/16 0454  NA 132* 132*  K 5.0 4.6  CL 94* 97*  CO2 27 28  GLUCOSE 105* 103*  BUN 72* 44*  CREATININE 9.93* 6.76*  CALCIUM 7.4* 7.0*   No results for input(s): PTH in the last 72 hours. Iron Studies: No results for input(s): IRON, TIBC, TRANSFERRIN, FERRITIN in the last 72 hours. Studies/Results: Dg Abd 1 View  01/10/2016  CLINICAL DATA:  Abdominal pain generalized. EXAM: ABDOMEN - 1 VIEW COMPARISON:  CT 01/03/2016 FINDINGS: Bowel gas pattern is nonobstructive. There is mild fecal retention throughout the colon. No  free peritoneal air. No mass or mass effect. Mild degenerate change of the spine. Fusion hardware intact over the approximate L5-S1 level. IMPRESSION: Nonobstructive bowel gas pattern. Electronically Signed   By: Elberta Fortisaniel  Boyle M.D.   On: 01/10/2016 13:46    Scheduled: . sodium chloride   Intravenous Once  . sodium chloride   Intravenous Once  . amitriptyline  50 mg Oral QHS  . aspirin  81 mg Oral Daily  . citalopram  40 mg Oral Daily  . gabapentin  300 mg Oral Daily  . heparin subcutaneous  5,000 Units Subcutaneous Q8H  . pantoprazole  40 mg Oral Daily  . polyethylene glycol  17 g Oral Daily  . risperiDONE  1 mg Oral QHS  . senna-docusate  1 tablet Oral BID  . sodium chloride flush  10-40 mL Intracatheter Q12H     LOS: 9 days   Recie Cirrincione C 01/12/2016,11:42 AM
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
## 2016-01-13 LAB — TYPE AND SCREEN
ABO/RH(D): O POS
ANTIBODY SCREEN: NEGATIVE
UNIT DIVISION: 0
UNIT DIVISION: 0
Unit division: 0
Unit division: 0

## 2016-01-13 LAB — RENAL FUNCTION PANEL
ANION GAP: 9 (ref 5–15)
Albumin: 1.4 g/dL — ABNORMAL LOW (ref 3.5–5.0)
BUN: 30 mg/dL — AB (ref 6–20)
CO2: 28 mmol/L (ref 22–32)
Calcium: 7.3 mg/dL — ABNORMAL LOW (ref 8.9–10.3)
Chloride: 98 mmol/L — ABNORMAL LOW (ref 101–111)
Creatinine, Ser: 5.35 mg/dL — ABNORMAL HIGH (ref 0.61–1.24)
GFR calc Af Amer: 13 mL/min — ABNORMAL LOW (ref >60)
GFR calc non Af Amer: 11 mL/min — ABNORMAL LOW (ref >60)
GLUCOSE: 114 mg/dL — AB (ref 65–99)
POTASSIUM: 4.3 mmol/L (ref 3.5–5.1)
Phosphorus: 5.6 mg/dL — ABNORMAL HIGH (ref 2.5–4.6)
Sodium: 135 mmol/L (ref 135–145)

## 2016-01-13 LAB — CBC
HEMATOCRIT: 23.4 % — AB (ref 39.0–52.0)
HEMOGLOBIN: 7.8 g/dL — AB (ref 13.0–17.0)
MCH: 28 pg (ref 26.0–34.0)
MCHC: 33.3 g/dL (ref 30.0–36.0)
MCV: 83.9 fL (ref 78.0–100.0)
Platelets: 255 10*3/uL (ref 150–400)
RBC: 2.79 MIL/uL — ABNORMAL LOW (ref 4.22–5.81)
RDW: 14.4 % (ref 11.5–15.5)
WBC: 14.5 10*3/uL — ABNORMAL HIGH (ref 4.0–10.5)

## 2016-01-13 MED ORDER — FUROSEMIDE 10 MG/ML IJ SOLN
160.0000 mg | Freq: Three times a day (TID) | INTRAVENOUS | Status: DC
  Administered 2016-01-13 – 2016-01-17 (×12): 160 mg via INTRAVENOUS
  Filled 2016-01-13 (×14): qty 16

## 2016-01-13 MED ORDER — BISACODYL 10 MG RE SUPP
10.0000 mg | Freq: Once | RECTAL | Status: AC
  Administered 2016-01-13: 10 mg via RECTAL
  Filled 2016-01-13: qty 1

## 2016-01-13 MED ORDER — DIPHENHYDRAMINE HCL 25 MG PO CAPS
25.0000 mg | ORAL_CAPSULE | Freq: Four times a day (QID) | ORAL | Status: DC | PRN
  Administered 2016-01-13 – 2016-01-21 (×4): 25 mg via ORAL
  Filled 2016-01-13 (×4): qty 1

## 2016-01-13 MED ORDER — POLYETHYLENE GLYCOL 3350 17 G PO PACK
17.0000 g | PACK | Freq: Two times a day (BID) | ORAL | Status: DC
Start: 2016-01-13 — End: 2016-01-14
  Administered 2016-01-13 (×2): 17 g via ORAL
  Filled 2016-01-13 (×3): qty 1

## 2016-01-13 NOTE — Progress Notes (Signed)
Patient states he does not remember when his last BM was. Patient is already receiving senna and miralax and per MD has been refusing enemas or suppositories. IMTS notified and new orders were received for double doses of miralax. Diet also changed from carb mod to renal as patient is not a diabetic.  Leanna BattlesEckelmann, Latara Micheli Eileen, RN.

## 2016-01-13 NOTE — Progress Notes (Signed)
While administering pt's suppository and assessing pt's skin, it was noted that patient has a generalized rash on his thighs, back, abdomen, chest, and bilateral arms. It is red, raised in some areas, and itchy in some areas. Dr. Doroteo GlassmanPhelps notified. Awaiting new orders.  Leanna BattlesEckelmann, Khalfani Weideman Eileen, RN.
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
## 2016-01-13 NOTE — Progress Notes (Signed)
Assessment/Plan: 55 year old white male with hypertension and chronic pain who presents with rhabdo after his fall and immobility in the setting of narcotics and Klonopin 1. 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 Dialysis requiring, last HD Friday.  Will give furosemide! 2. Hypertension/volume  3. Anemia s/p PRBCs 4. S/P Bilat AKAs, for Rehab 5 Scrotal swelling, due to severe 3rd spacing of fluid. Scrotal elevation, dialysis/diuresis, may need to ask GU for help  Subjective: Interval History: Making urine now  Objective: Vital signs in last 24 hours: Temp:  [97.7 F (36.5 C)-99.4 F (37.4 C)] 98 F (36.7 C) (06/25 0905) Pulse Rate:  [59-95] 59 (06/25 0905) Resp:  [12-18] 18 (06/25 0905) BP: (98-134)/(55-85) 111/65 mmHg (06/25 0905) SpO2:  [91 %-100 %] 91 % (06/25 0905) Weight:  [95 kg (209 lb 7 oz)-96.8 kg (213 lb 6.5 oz)] 95.482 kg (210 lb 8 oz) (06/24 2028) Weight change: -2.7 kg (-5 lb 15.2 oz)  Intake/Output from previous day: 06/24 0701 - 06/25 0700 In: 1440 [P.O.:1440] Out: 5106 [Urine:1125] Intake/Output this shift: Total I/O In: 120 [P.O.:120] Out: 0   General appearance: alert and cooperative Chest wall: no tenderness Male genitalia: marked swelling Extremities: edema 2-3+  Lab Results:  Recent Labs  01/12/16 0453 01/12/16 1947 01/13/16 0430  WBC 12.1*  --  14.5*  HGB 6.2* 8.0* 7.8*  HCT 18.6* 24.2* 23.4*  PLT 239  --  255   BMET:  Recent Labs  01/12/16 0454 01/13/16 0430  NA 132* 135  K 4.6 4.3  CL 97* 98*  CO2 28 28  GLUCOSE 103* 114*  BUN 44* 30*  CREATININE 6.76* 5.35*  CALCIUM 7.0* 7.3*   No results for input(s): PTH in the last 72 hours. Iron Studies: No results for input(s): IRON, TIBC, TRANSFERRIN, FERRITIN in the last 72 hours. Studies/Results: No results found.  Scheduled: . sodium chloride   Intravenous Once  . sodium chloride   Intravenous Once  . amitriptyline  50 mg Oral QHS   . aspirin  81 mg Oral Daily  . citalopram  40 mg Oral QHS  . gabapentin  300 mg Oral Daily  . heparin subcutaneous  5,000 Units Subcutaneous Q8H  . pantoprazole  40 mg Oral Daily  . polyethylene glycol  17 g Oral BID  . risperiDONE  1 mg Oral QHS  . senna-docusate  1 tablet Oral BID  . sodium chloride flush  10-40 mL Intracatheter Q12H    LOS: 10 days   Suriah Peragine C 01/13/2016,11:20 AM

## 2016-01-13 NOTE — Progress Notes (Signed)
Orthopedic Tech Progress Note Patient Details:  Jeffery Gross 05-02-1961 [id number]  Ortho Devices Ortho Device/Splint Location: applied ohf to bed Ortho Device/Splint Interventions: Ordered, Application   Jennye MoccasinHughes, Elveta Rape Craig 01/13/2016, 10:18 PM

## 2016-01-13 NOTE — Progress Notes (Signed)
Recevied call from nurse about diffuse rash on patient. Went to examine patient. Rash noted on arms, legs, and abdomen. Unable to assess patient's back while he was on bed pan. Rash appears to be a contact dermatitis/ allergic dermatitis. Has areas of raised papules and coalescing. Patient is not concerned about rash and doesn't know when it first appeared. It does not bother him. Order given for prn benadryl. Will continue to monitor.

## 2016-01-14 LAB — URINALYSIS, ROUTINE W REFLEX MICROSCOPIC
BILIRUBIN URINE: NEGATIVE
Glucose, UA: NEGATIVE mg/dL
Ketones, ur: NEGATIVE mg/dL
NITRITE: NEGATIVE
Protein, ur: NEGATIVE mg/dL
SPECIFIC GRAVITY, URINE: 1.008 (ref 1.005–1.030)
pH: 7.5 (ref 5.0–8.0)

## 2016-01-14 LAB — CBC
HCT: 22.7 % — ABNORMAL LOW (ref 39.0–52.0)
Hemoglobin: 7.5 g/dL — ABNORMAL LOW (ref 13.0–17.0)
MCH: 28 pg (ref 26.0–34.0)
MCHC: 33 g/dL (ref 30.0–36.0)
MCV: 84.7 fL (ref 78.0–100.0)
PLATELETS: 302 10*3/uL (ref 150–400)
RBC: 2.68 MIL/uL — AB (ref 4.22–5.81)
RDW: 14.3 % (ref 11.5–15.5)
WBC: 17.9 10*3/uL — AB (ref 4.0–10.5)

## 2016-01-14 LAB — CBC WITH DIFFERENTIAL/PLATELET
BASOS PCT: 0 %
Basophils Absolute: 0 10*3/uL (ref 0.0–0.1)
Eosinophils Absolute: 0.7 10*3/uL (ref 0.0–0.7)
Eosinophils Relative: 4 %
HEMATOCRIT: 22 % — AB (ref 39.0–52.0)
Hemoglobin: 7.3 g/dL — ABNORMAL LOW (ref 13.0–17.0)
Lymphocytes Relative: 7 %
Lymphs Abs: 1.2 10*3/uL (ref 0.7–4.0)
MCH: 28.3 pg (ref 26.0–34.0)
MCHC: 33.2 g/dL (ref 30.0–36.0)
MCV: 85.3 fL (ref 78.0–100.0)
MONO ABS: 0.9 10*3/uL (ref 0.1–1.0)
MONOS PCT: 5 %
NEUTROS ABS: 16.2 10*3/uL — AB (ref 1.7–7.7)
Neutrophils Relative %: 84 %
Platelets: 315 10*3/uL (ref 150–400)
RBC: 2.58 MIL/uL — ABNORMAL LOW (ref 4.22–5.81)
RDW: 14.1 % (ref 11.5–15.5)
WBC: 19.1 10*3/uL — ABNORMAL HIGH (ref 4.0–10.5)

## 2016-01-14 LAB — HEPATIC FUNCTION PANEL
ALT: 10 U/L — ABNORMAL LOW (ref 17–63)
AST: 62 U/L — ABNORMAL HIGH (ref 15–41)
Albumin: 1.5 g/dL — ABNORMAL LOW (ref 3.5–5.0)
Alkaline Phosphatase: 66 U/L (ref 38–126)
Total Bilirubin: 0.3 mg/dL (ref 0.3–1.2)
Total Protein: 3.8 g/dL — ABNORMAL LOW (ref 6.5–8.1)

## 2016-01-14 LAB — RENAL FUNCTION PANEL
ALBUMIN: 1.3 g/dL — AB (ref 3.5–5.0)
Anion gap: 8 (ref 5–15)
BUN: 41 mg/dL — AB (ref 6–20)
CO2: 24 mmol/L (ref 22–32)
CREATININE: 6.45 mg/dL — AB (ref 0.61–1.24)
Calcium: 6.9 mg/dL — ABNORMAL LOW (ref 8.9–10.3)
Chloride: 101 mmol/L (ref 101–111)
GFR, EST AFRICAN AMERICAN: 10 mL/min — AB (ref >60)
GFR, EST NON AFRICAN AMERICAN: 9 mL/min — AB (ref >60)
Glucose, Bld: 99 mg/dL (ref 65–99)
PHOSPHORUS: 5.4 mg/dL — AB (ref 2.5–4.6)
POTASSIUM: 4.5 mmol/L (ref 3.5–5.1)
Sodium: 133 mmol/L — ABNORMAL LOW (ref 135–145)

## 2016-01-14 LAB — PROTIME-INR
INR: 1.19 (ref 0.00–1.49)
Prothrombin Time: 15.3 seconds — ABNORMAL HIGH (ref 11.6–15.2)

## 2016-01-14 LAB — IRON AND TIBC
IRON: 8 ug/dL — AB (ref 45–182)
SATURATION RATIOS: 3 % — AB (ref 17.9–39.5)
TIBC: 245 ug/dL — AB (ref 250–450)
UIBC: 237 ug/dL

## 2016-01-14 LAB — FERRITIN: FERRITIN: 209 ng/mL (ref 24–336)

## 2016-01-14 LAB — URINE MICROSCOPIC-ADD ON: Bacteria, UA: NONE SEEN

## 2016-01-14 LAB — LACTATE DEHYDROGENASE: LDH: 384 U/L — ABNORMAL HIGH (ref 98–192)

## 2016-01-14 LAB — OCCULT BLOOD X 1 CARD TO LAB, STOOL: Fecal Occult Bld: NEGATIVE

## 2016-01-14 LAB — RETICULOCYTES
RBC.: 2.58 MIL/uL — ABNORMAL LOW (ref 4.22–5.81)
Retic Count, Absolute: 61.9 10*3/uL (ref 19.0–186.0)
Retic Ct Pct: 2.4 % (ref 0.4–3.1)

## 2016-01-14 LAB — AMMONIA: AMMONIA: 17 umol/L (ref 9–35)

## 2016-01-14 MED ORDER — OXYCODONE HCL 5 MG PO TABS
10.0000 mg | ORAL_TABLET | Freq: Three times a day (TID) | ORAL | Status: DC | PRN
Start: 2016-01-14 — End: 2016-01-23
  Administered 2016-01-15 – 2016-01-23 (×18): 10 mg via ORAL
  Filled 2016-01-14 (×20): qty 2

## 2016-01-14 MED ORDER — POLYETHYLENE GLYCOL 3350 17 G PO PACK
17.0000 g | PACK | Freq: Every day | ORAL | Status: DC | PRN
  Administered 2016-01-18 – 2016-01-23 (×2): 17 g via ORAL
  Filled 2016-01-14 (×2): qty 1

## 2016-01-14 MED ORDER — SENNOSIDES-DOCUSATE SODIUM 8.6-50 MG PO TABS
1.0000 | ORAL_TABLET | Freq: Every evening | ORAL | Status: DC | PRN
  Administered 2016-01-16 – 2016-01-21 (×4): 1 via ORAL
  Filled 2016-01-14 (×4): qty 1

## 2016-01-14 NOTE — Progress Notes (Signed)
Patient refusing laxatives at this time but is requesting a stool softener.  MD notified.

## 2016-01-14 NOTE — Progress Notes (Addendum)
Nutrition Follow-up  DOCUMENTATION CODES:   Not applicable  INTERVENTION:  Provide Boost Breeze po BID, each supplement provides 250 kcal and 9 grams of protein.  Encourage adequate PO intake.   NUTRITION DIAGNOSIS:   Increased nutrient needs related to wound healing as evidenced by estimated needs; ongoing  GOAL:   Patient will meet greater than or equal to 90% of their needs; progressing  MONITOR:   PO intake, Supplement acceptance, Labs, Weight trends, Skin, I & O's  REASON FOR ASSESSMENT:   Malnutrition Screening Tool    ASSESSMENT:   Jeffery Gross is a 55 y.o. male presenting with fall found to have rhabdomyolysis and right DVT. PMH is significant for chronic back pain, HTN, depression and anxiety.   Pt admitted with rhabdomyolysis, DVT, and bilateral lower leg compartment syndrome.   S/p Procedure 12/1515:  1. Bilateral lower leg Four-compartment fasciotomy bilateral lower leg 2. Application of wound vac >50 sq cm  S/p PROCEDURE on 01/05/16 and 01/07/16: 1. Irrigation and debridement of muscle and skin of left lower extremity medial wound 35 cm x 7 cm x 2 cm; lateral wound 15 cm x 5 cm x 1 cm 2. Application of wound VAC greater than 50 cm  S/p PROCEDURE 6/21: Bilateral Above Knee Amputation, Apply Wound VAC 2  Nephrology following. Pt with AKI due to ATN from rhabdomyolysis.  Pt underwent HD cath placement on 01/07/16 and HD was initiated.   Meal completion has been mostly 100%. Pt was asleep during time of visit and did not wake. Pt increased nutrient needs. RD to order Boost Breeze to aid in wound healing. Noted pt does not like Ensure as it causes abdominal pains.   Phosphorous elevated at 5.4.  Diet Order:  Diet renal with fluid restriction Fluid restriction:: 1200 mL Fluid; Room service appropriate?: Yes; Fluid consistency:: Thin  Skin:  Wound (see comment) (Stage II on R thigh, sacrum, scrotum; Bilat AKA w/ woundVACs)  Last BM:  6/25  Height:    Ht Readings from Last 1 Encounters:  01/04/16 6\' 1"  (1.854 m)    Weight:   Wt Readings from Last 1 Encounters:  01/12/16 210 lb 8 oz (95.482 kg)    Ideal Body Weight:  86.4 kg  BMI:  Body mass index is 27.78 kg/(m^2).  Estimated Nutritional Needs:   Kcal:  2200-2400  Protein:  120-135 grams  Fluid:  1.2 L/day  EDUCATION NEEDS:   Education needs addressed  Roslyn SmilingStephanie Deion Forgue, MS, RD, LDN Pager # [phone] After hours/ weekend pager # [phone]
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
## 2016-01-14 NOTE — Progress Notes (Signed)
Went to evaluate Jeffery Gross after report of increased drowsiness. Initially was very sleepy with reluctance to open eyes. A family member and nurse then arrived and Jeffery Gross became alert and was conversing normally. Reports increased fatigue, which he suspects is due to physical therapy. Will decrease narcotics and continue to monitor.  Dr. Caroleen Hammanumley 01/14/16, 3:30PM

## 2016-01-14 NOTE — Progress Notes (Signed)
Physical Therapy Treatment Patient Details Name: Jeffery Gross MRN: [id number] DOB: 1961-03-06 Today's Date: 01/14/2016    History of Present Illness Jeffery Gross is a 55 y.o. 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 PMH is significant for chronic back pain, back surgery, HTN, depression and anxiety.     PT Comments    Noting very good progress with functional mobility and activity tolerance; Able to pull self to sitting, perform weight shifts and chair push ups, and performed anterior-posterior transfer to use BSC instead of bed pan;   Good progress; Continue to recommend comprehensive inpatient rehab (CIR) for post-acute therapy needs.   Will look toward lateral scoot, sliding board transfers in ensuing sessions;   Of note, using a pillow case as a "hammock" for his scrotum worked well for more comfort with transfers.  Follow Up Recommendations  CIR     Equipment Recommendations  Wheelchair (measurements PT);Wheelchair cushion (measurements PT)    Recommendations for Other Services Rehab consult     Precautions / Restrictions Precautions Precautions: Fall Precaution Comments: B LE wound vacs Restrictions Weight Bearing Restrictions: Yes RLE Weight Bearing: Non weight bearing LLE Weight Bearing: Non weight bearing    Mobility  Bed Mobility Overal bed mobility: Needs Assistance Bed Mobility: Supine to Sit Rolling: Min guard   Supine to sit: Min guard     General bed mobility comments: Used bil rails to pull self to long sit in the bed; minguard assist for safety as pt lateral and reciprocal scooted his hips to EOB in prep for Ant/Post transfer; Used bed rails for stability; Noted good lateral lean R and L  Transfers Overall transfer level: Needs assistance   Transfers:  Licensed conveyancerAnterior-Posterior Transfer       Anterior-Posterior transfers: +2 safety/equipment;Min assist;+2 physical assistance;Max assist   General transfer comment: Very nice Ant/Post transfer [facility], with cues for technique, and assist also to stabilize BSC; Needing considerably more assist going from [facility] to drop-arm recliner, slightly "uphill" as the recliner seat was padded for comfort; Max assist of 2 for that transfer  Ambulation/Gait                 Stairs            Wheelchair Mobility    Modified Rankin (Stroke Patients Only)       Balance     Sitting balance-Leahy Scale: Fair Sitting balance - Comments: Able to come to fully upright sitting, and work on lateral leans with elbow prop and even opposite hip hike                            Cognition Arousal/Alertness: Awake/alert Behavior During Therapy: WFL for tasks assessed/performed Overall Cognitive Status: Within Functional Limits for tasks assessed                      Exercises Other Exercises Other Exercises: Lateral leans and hip hikes Other Exercises: Chair push ups X10    General Comments General comments (skin integrity, edema, etc.): A caller who pt identified as is parole officer called during session, and pt requested that I give her some information about his status; with pt's permission, I briefly informed his parole officer of his injuries and status  Pertinent Vitals/Pain Pain Assessment: Faces Faces Pain Scale: Hurts even more Pain Location: Spasm-like pain Bil LEs, R worse than L  Pain Descriptors / Indicators: Aching;Burning;Spasm Pain Intervention(s): Monitored during session;Premedicated before session;Repositioned    Home Living                      Prior Function            PT Goals (current goals can now be found in the care plan section) Acute Rehab PT Goals Patient Stated Goal: [facility] Goal Formulation: With  patient Time For Goal Achievement: 01/24/16 Potential to Achieve Goals: Good Progress towards PT goals: Progressing toward goals (May meet goals next session and need an upgrade)    Frequency  Min 3X/week    PT Plan Current plan remains appropriate    Co-evaluation             End of Session Equipment Utilized During Treatment:  (bed pads) Activity Tolerance: Patient tolerated treatment well Patient left: in chair;with call bell/phone within reach;with nursing/sitter in room     Time: [phone] (minus approx 10 minutes on BSC) PT Time Calculation (min) (ACUTE ONLY): 60 min  Charges:  $Therapeutic Activity: 38-52 mins                    G Codes:      Olen PelGarrigan, Merdith Boyd Hamff 01/14/2016, 11:25 AM  Van ClinesHolly Zair Borawski, PT  Acute Rehabilitation Services Pager [phone] Office [phone]

## 2016-01-14 NOTE — Progress Notes (Signed)
Patient ID: Jeffery HarbourScott D Sahakian, male   DOB: Mar 02, 1961, 55 y.o.   MRN: [id number] Patient without complaints this morning. The wound vacs are functioning well no additional drainage. I am not sure why patient's white cell count is elevated. Amputation level was above the zone of injury.

## 2016-01-14 NOTE — Progress Notes (Signed)
  [facility] Progress Note    Assessment/ Plan:   55 year old white male with hypertension and chronic pain who presents with rhabdo after his fall and immobility in the setting of narcotics and Klonopin  1. 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 Dialysis requiring, last HD Friday. Responded nicely to furosemide. - UOP very good even with cr still rising. - repeat chem panel in AM and if UOP still great would like to hold HD. 2. Hypertension/volume  3. Anemia s/p PRBCs 4. S/P Bilat AKAs, for Rehab 5 Scrotal swelling, due to severe 3rd spacing of fluid. Scrotal elevation, dialysis/diuresis, may need to ask GU for help  Subjective:   Minor cough nonproductive. Denies f/c/n/v. Good appetite.   Objective:   BP 123/77 mmHg  Pulse 84  Temp(Src) 98.2 F (36.8 C) (Oral)  Resp 18  Ht 6\' 1"  (1.854 m)  Wt 95.482 kg (210 lb 8 oz)  BMI 27.78 kg/m2  SpO2 98%  Intake/Output Summary (Last 24 hours) at 01/14/16 1157 Last data filed at 01/14/16 1100  Gross per 24 hour  Intake   1862 ml  Output    800 ml  Net   1062 ml   Weight change:   Physical Exam: General appearance: alert and cooperative  Chest wall: no tenderness  Neck: LIJ temp Male genitalia: marked swelling  Extremities: B/L AKA's Positive foley  Imaging: No results found.  Labs: BMET  Recent Labs Lab 01/08/16 0400 01/09/16 0320 01/09/16 1251 01/10/16 0300 01/11/16 0530 01/12/16 0454 01/13/16 0430 01/14/16 0400  NA 130* 129* 131* 131* 132* 132* 135 133*  K 4.3 5.4* 4.5 4.7 5.0 4.6 4.3 4.5  CL 89* 88*  --  94* 94* 97* 98* 101  CO2 30 25  --  25 27 28 28 24   GLUCOSE 121* 97 95 94 105* 103* 114* 99  BUN 77* 88*  --  54* 72* 44* 30* 41*  CREATININE 8.21* 9.61*  --  7.47* 9.93* 6.76* 5.35* 6.45*  CALCIUM 6.5* 7.1*  --  7.4* 7.4* 7.0* 7.3* 6.9*  PHOS 7.4* 8.5*  --  8.1* 9.3* 6.6* 5.6* 5.4*   CBC  Recent Labs Lab 01/11/16 0530  01/12/16 0453 01/12/16 1947 01/13/16 0430 01/14/16 0825  WBC 11.6* 12.1*  --  14.5* 17.9*  HGB 7.2* 6.2* 8.0* 7.8* 7.5*  HCT 21.2* 18.6* 24.2* 23.4* 22.7*  MCV 84.5 84.5  --  83.9 84.7  PLT 249 239  --  255 302    Medications:    . sodium chloride   Intravenous Once  . sodium chloride   Intravenous Once  . amitriptyline  50 mg Oral QHS  . aspirin  81 mg Oral Daily  . citalopram  40 mg Oral QHS  . furosemide  160 mg Intravenous Q8H  . gabapentin  300 mg Oral Daily  . heparin subcutaneous  5,000 Units Subcutaneous Q8H  . pantoprazole  40 mg Oral Daily  . risperiDONE  1 mg Oral QHS  . sodium chloride flush  10-40 mL Intracatheter Q12H      Paulene FloorJames Fatou Dunnigan, MD 01/14/2016, 11:57 AM

## 2016-01-15 DIAGNOSIS — T796XXA Traumatic ischemia of muscle, initial encounter: Secondary | ICD-10-CM

## 2016-01-15 DIAGNOSIS — Z452 Encounter for adjustment and management of vascular access device: Secondary | ICD-10-CM

## 2016-01-15 DIAGNOSIS — W19XXXA Unspecified fall, initial encounter: Secondary | ICD-10-CM

## 2016-01-15 DIAGNOSIS — L8990 Pressure ulcer of unspecified site, unspecified stage: Secondary | ICD-10-CM

## 2016-01-15 DIAGNOSIS — I82401 Acute embolism and thrombosis of unspecified deep veins of right lower extremity: Secondary | ICD-10-CM

## 2016-01-15 LAB — GLUCOSE, CAPILLARY: Glucose-Capillary: 122 mg/dL — ABNORMAL HIGH (ref 65–99)

## 2016-01-15 LAB — RENAL FUNCTION PANEL
ANION GAP: 13 (ref 5–15)
Albumin: 1.6 g/dL — ABNORMAL LOW (ref 3.5–5.0)
BUN: 58 mg/dL — ABNORMAL HIGH (ref 6–20)
CO2: 27 mmol/L (ref 22–32)
Calcium: 8.1 mg/dL — ABNORMAL LOW (ref 8.9–10.3)
Chloride: 92 mmol/L — ABNORMAL LOW (ref 101–111)
Creatinine, Ser: 8.39 mg/dL — ABNORMAL HIGH (ref 0.61–1.24)
GFR calc Af Amer: 7 mL/min — ABNORMAL LOW (ref >60)
GFR calc non Af Amer: 6 mL/min — ABNORMAL LOW (ref >60)
GLUCOSE: 93 mg/dL (ref 65–99)
PHOSPHORUS: 6.7 mg/dL — AB (ref 2.5–4.6)
POTASSIUM: 4.8 mmol/L (ref 3.5–5.1)
Sodium: 132 mmol/L — ABNORMAL LOW (ref 135–145)

## 2016-01-15 LAB — HEPATIC FUNCTION PANEL
ALBUMIN: 1.6 g/dL — AB (ref 3.5–5.0)
ALT: 11 U/L — AB (ref 17–63)
AST: 53 U/L — AB (ref 15–41)
Alkaline Phosphatase: 61 U/L (ref 38–126)
Total Bilirubin: 0.4 mg/dL (ref 0.3–1.2)
Total Protein: 4.2 g/dL — ABNORMAL LOW (ref 6.5–8.1)

## 2016-01-15 LAB — CBC
HEMATOCRIT: 22.3 % — AB (ref 39.0–52.0)
HEMOGLOBIN: 7.5 g/dL — AB (ref 13.0–17.0)
MCH: 28.5 pg (ref 26.0–34.0)
MCHC: 33.6 g/dL (ref 30.0–36.0)
MCV: 84.8 fL (ref 78.0–100.0)
Platelets: 342 10*3/uL (ref 150–400)
RBC: 2.63 MIL/uL — ABNORMAL LOW (ref 4.22–5.81)
RDW: 14 % (ref 11.5–15.5)
WBC: 12.5 10*3/uL — ABNORMAL HIGH (ref 4.0–10.5)

## 2016-01-15 MED ORDER — SODIUM CHLORIDE 0.9 % IV SOLN: 100.0000 mL | INTRAVENOUS | Status: DC | PRN

## 2016-01-15 MED ORDER — PENTAFLUOROPROP-TETRAFLUOROETH EX AERO: 1.0000 "application " | INHALATION_SPRAY | CUTANEOUS | Status: DC | PRN

## 2016-01-15 MED ORDER — LIDOCAINE-PRILOCAINE 2.5-2.5 % EX CREA: 1.0000 "application " | TOPICAL_CREAM | CUTANEOUS | Status: DC | PRN

## 2016-01-15 MED ORDER — ALTEPLASE 2 MG IJ SOLR: 2.0000 mg | Freq: Once | INTRAMUSCULAR | Status: DC | PRN

## 2016-01-15 MED ORDER — LIDOCAINE HCL (PF) 1 % IJ SOLN: 5.0000 mL | INTRAMUSCULAR | Status: DC | PRN

## 2016-01-15 NOTE — Progress Notes (Signed)
  [facility] Progress Note    Assessment/ Plan:   55 year old white male with hypertension and chronic pain who presents with rhabdo after his fall and immobility in the setting of narcotics and Klonopin  1. 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 Dialysis requiring, last HD Friday. Responded nicely to furosemide. - UOP very good even with cr still rising but is on high doses of Lasix. - Unclear what true weight is but he certainly still has plenty of fluid onboard by PE. - True EDW may be in the 76-82kg range (based on extrapolations of the weight lost after the b/l AKA's). - bec of worsening renal function + ~18kg up on extrapolated EDW will dialyze for clearance and UF today so we can decrease the diuretic dose hopefully by thur. 2. Hypertension/volume  3. Anemia s/p PRBCs 4. S/P Bilat AKAs, for Rehab 5 Scrotal swelling, due to severe 3rd spacing of fluid. Scrotal elevation, dialysis/diuresis, may need to ask GU for help  Subjective:   Minor cough nonproductive. Denies f/c/n/v. Good appetite.   Objective:   BP 128/81 mmHg  Pulse 87  Temp(Src) 98 F (36.7 C) (Oral)  Resp 20  Ht 6\' 1"  (1.854 m)  Wt 95.482 kg (210 lb 8 oz)  BMI 27.78 kg/m2  SpO2 98%  Intake/Output Summary (Last 24 hours) at 01/15/16 0901 Last data filed at 01/15/16 [id number]  Gross per 24 hour  Intake    678 ml  Output   2750 ml  Net  -2072 ml   Weight change:   Physical Exam: General appearance: alert and cooperative  Chest wall: no tenderness  Neck: LIJ temp Male genitalia: marked swelling  Extremities: B/L AKA's Positive foley  Imaging: No results found.  Labs: BMET  Recent Labs Lab 01/09/16 0320 01/09/16 1251 01/10/16 0300 01/11/16 0530 01/12/16 0454 01/13/16 0430 01/14/16 0400 01/15/16 0608  NA 129* 131* 131* 132* 132* 135 133* 132*  K 5.4* 4.5 4.7 5.0 4.6 4.3 4.5 4.8  CL 88*  --  94* 94* 97* 98* 101 92*  CO2 25   --  25 27 28 28 24 27   GLUCOSE 97 95 94 105* 103* 114* 99 93  BUN 88*  --  54* 72* 44* 30* 41* 58*  CREATININE 9.61*  --  7.47* 9.93* 6.76* 5.35* 6.45* 8.39*  CALCIUM 7.1*  --  7.4* 7.4* 7.0* 7.3* 6.9* 8.1*  PHOS 8.5*  --  8.1* 9.3* 6.6* 5.6* 5.4* 6.7*   CBC  Recent Labs Lab 01/13/16 0430 01/14/16 0825 01/14/16 1550 01/15/16 0608  WBC 14.5* 17.9* 19.1* 12.5*  NEUTROABS  --   --  16.2*  --   HGB 7.8* 7.5* 7.3* 7.5*  HCT 23.4* 22.7* 22.0* 22.3*  MCV 83.9 84.7 85.3 84.8  PLT 255 302 315 342    Medications:    . sodium chloride   Intravenous Once  . sodium chloride   Intravenous Once  . amitriptyline  50 mg Oral QHS  . aspirin  81 mg Oral Daily  . citalopram  40 mg Oral QHS  . furosemide  160 mg Intravenous Q8H  . gabapentin  300 mg Oral Daily  . heparin subcutaneous  5,000 Units Subcutaneous Q8H  . pantoprazole  40 mg Oral Daily  . risperiDONE  1 mg Oral QHS  . sodium chloride flush  10-40 mL Intracatheter Q12H      Paulene FloorJames Sharlyn Odonnel, MD 01/15/2016, 9:01 AM
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
## 2016-01-15 NOTE — Progress Notes (Signed)
Spoke with Dr. Marlou PorchHerrick (Urology) regarding patient's scrotal swelling. Dr. Marlou PorchHerrick states that there is nothing to do from their perspective. The scrotal swelling will get better with him. He recommends taking a dish towel sized towel, folding it in half, rolling it into a burrito, and sticking it under the scrotum to elevate the scrotum above the level of the pelvis.  I will discuss this with Pt's RN.  Willadean CarolKaty Mayo, MD PGY-1

## 2016-01-16 DIAGNOSIS — R109 Unspecified abdominal pain: Secondary | ICD-10-CM

## 2016-01-16 LAB — SAVE SMEAR

## 2016-01-16 LAB — BASIC METABOLIC PANEL
ANION GAP: 10 (ref 5–15)
BUN: 37 mg/dL — ABNORMAL HIGH (ref 6–20)
CHLORIDE: 99 mmol/L — AB (ref 101–111)
CO2: 27 mmol/L (ref 22–32)
Calcium: 7.7 mg/dL — ABNORMAL LOW (ref 8.9–10.3)
Creatinine, Ser: 6.05 mg/dL — ABNORMAL HIGH (ref 0.61–1.24)
GFR calc Af Amer: 11 mL/min — ABNORMAL LOW (ref >60)
GFR, EST NON AFRICAN AMERICAN: 9 mL/min — AB (ref >60)
GLUCOSE: 107 mg/dL — AB (ref 65–99)
POTASSIUM: 4 mmol/L (ref 3.5–5.1)
Sodium: 136 mmol/L (ref 135–145)

## 2016-01-16 LAB — CBC
HEMATOCRIT: 20.7 % — AB (ref 39.0–52.0)
HEMOGLOBIN: 6.7 g/dL — AB (ref 13.0–17.0)
MCH: 27 pg (ref 26.0–34.0)
MCHC: 31.9 g/dL (ref 30.0–36.0)
MCV: 84.8 fL (ref 78.0–100.0)
Platelets: 345 10*3/uL (ref 150–400)
RBC: 2.44 MIL/uL — ABNORMAL LOW (ref 4.22–5.81)
RDW: 13.9 % (ref 11.5–15.5)
WBC: 8.4 10*3/uL (ref 4.0–10.5)

## 2016-01-16 LAB — HEMOGLOBIN AND HEMATOCRIT, BLOOD
HEMATOCRIT: 22.4 % — AB (ref 39.0–52.0)
Hemoglobin: 7.4 g/dL — ABNORMAL LOW (ref 13.0–17.0)

## 2016-01-16 MED ORDER — SODIUM CHLORIDE 0.9 % IV SOLN
510.0000 mg | Freq: Once | INTRAVENOUS | Status: AC
  Administered 2016-01-16: 510 mg via INTRAVENOUS
  Filled 2016-01-16: qty 17

## 2016-01-16 NOTE — Progress Notes (Signed)
Occupational Therapy Treatment Patient Details Name: Jeffery Gross Morrow MRN: [id number] DOB: 06-21-1961 Today's Date: 01/16/2016    History of present illness Jeffery Gross Seelye is a 55 y.o. male presenting with fall (?down x 2 days?) found to have rhabdomyolysis, right DVT and bilateral LE compartment syndrome requiring bil fasiciotomies 6/15, 6/17 I&Gross bil legs, began hemodialysis 6/19 due to AKI, 6/21 bilateral AKA. PMH is significant for chronic back pain, back surgery, HTN, depression and anxiety.    OT comments  Pt making good progress with functional goals. Pt able to transfer from bed - drop arm BS with lateral scoot technique and [facility] - recliner with slide board. OT will continue to follow acutely  Follow Up Recommendations  CIR    Equipment Recommendations  Wheelchair (measurements OT);Wheelchair cushion (measurements OT);Tub/shower seat    Recommendations for Other Services      Precautions / Restrictions Precautions Precautions: Fall Precaution Comments: B LE wound vacs Restrictions Weight Bearing Restrictions: Yes RLE Weight Bearing: Non weight bearing LLE Weight Bearing: Non weight bearing       Mobility Bed Mobility Overal bed mobility: Needs Assistance Bed Mobility: Supine to Sit;Rolling Rolling: Supervision;Min guard   Supine to sit: Supervision;HOB elevated (used rails)        Transfers Overall transfer level: Needs assistance Equipment used:  (slide board, drop arm BSC) Transfers: Lateral/Scoot Transfers          Lateral/Scoot Transfers: Mod assist;+2 safety/equipment General transfer comment: pt did very well with lateral scoot transfer from bed - BSC - recliner usinf slide board and was able to use B UEs to raise buttocks for therapists to adjust seating and pad underneath pt    Balance   Sitting-balance support: Bilateral upper extremity supported Sitting balance-Leahy Scale: Fair                             ADL       Grooming:  Wash/dry hands;Wash/dry face;Set up;Sitting   Upper Body Bathing: Minimal assitance;Sitting                                    Vision  no change from baseline                              Cognition   Behavior During Therapy: WFL for tasks assessed/performed Overall Cognitive Status: Within Functional Limits for tasks assessed                       Extremity/Trunk Assessment   generalized weakness                        General Comments  pt very pleasant and cooperative, jovial and motivated    Pertinent Vitals/ Pain       Pain Assessment: 0-10 Pain Score: 6  Pain Location: spasms B LEs, phantom pain Pain Descriptors / Indicators: Burning;Stabbing;Spasm Pain Intervention(s): Monitored during session;Premedicated before session;Repositioned                                                          Frequency Min  2X/week     Progress Toward Goals  OT Goals(current goals can now be found in the care plan section)  Progress towards OT goals: Progressing toward goals     Plan Frequency needs to be updated    Co-evaluation    PT/OT/SLP Co-Evaluation/Treatment: Yes Reason for Co-Treatment: Complexity of the patient's impairments (multi-system involvement);For patient/therapist safety   OT goals addressed during session: ADL's and self-care      End of Session Equipment Utilized During Treatment: Other (comment) (slide board, drop arm BSC)   Activity Tolerance Patient tolerated treatment well   Patient Left in chair;with call bell/phone within reach             Time: 1002-1047 OT Time Calculation (min): 45 min  Charges: OT General Charges $OT Visit: 1 Procedure OT Treatments $Therapeutic Activity: 8-22 mins  Galen ManilaSpencer, Cassadi Purdie Jeanette 01/16/2016, 1:58 PM
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
## 2016-01-16 NOTE — Progress Notes (Signed)
  [facility] Progress Note    Assessment/ Plan:   55 year old white male with hypertension and chronic pain who presents with rhabdo after his fall and immobility in the setting of narcotics and Klonopin  1. 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 Dialysis requiring, last HD Friday. Responded nicely to furosemide. - UOP very good even with cr still rising but is on high doses of Lasix. - Unclear what true weight is but he certainly still has plenty of fluid onboard by PE. - True EDW may be in the 76-82kg range (based on extrapolations of the weight lost after the b/l AKA's). - ~18kg up on extrapolated EDW.  -  will reassess in the AM for need for dialysis; if there are no absolute indications for dialysis in the AM would like to hold for Fri. 2. Hypertension/volume  3. Anemia s/p PRBCs 4. S/P Bilat AKAs, for Rehab 5 Scrotal swelling, due to severe 3rd spacing of fluid. Scrotal elevation, dialysis/diuresis, may need to ask GU for help  Subjective:   Minor cough nonproductive. Denies f/c/n/v. Good appetite.   Objective:   BP 109/61 mmHg  Pulse 79  Temp(Src) 98.2 F (36.8 C) (Oral)  Resp 18  Ht 6\' 1"  (1.854 m)  Wt 93.214 kg (205 lb 8 oz)  BMI 27.12 kg/m2  SpO2 96%  Intake/Output Summary (Last 24 hours) at 01/16/16 1223 Last data filed at 01/16/16 1001  Gross per 24 hour  Intake   1161 ml  Output   8810 ml  Net  -7649 ml   Weight change:   Physical Exam: General appearance: alert and cooperative  Chest wall: no tenderness  Neck: LIJ temp Male genitalia: marked swelling  Extremities: B/L AKA's Positive foley  Imaging: No results found.  Labs: BMET  Recent Labs Lab 01/10/16 0300 01/11/16 0530 01/12/16 0454 01/13/16 0430 01/14/16 0400 01/15/16 0608 01/16/16 0430  NA 131* 132* 132* 135 133* 132* 136  K 4.7 5.0 4.6 4.3 4.5 4.8 4.0  CL 94* 94* 97* 98* 101 92* 99*  CO2 25 27 28 28 24 27 27    GLUCOSE 94 105* 103* 114* 99 93 107*  BUN 54* 72* 44* 30* 41* 58* 37*  CREATININE 7.47* 9.93* 6.76* 5.35* 6.45* 8.39* 6.05*  CALCIUM 7.4* 7.4* 7.0* 7.3* 6.9* 8.1* 7.7*  PHOS 8.1* 9.3* 6.6* 5.6* 5.4* 6.7*  --    CBC  Recent Labs Lab 01/14/16 0825 01/14/16 1550 01/15/16 0608 01/16/16 0430 01/16/16 1129  WBC 17.9* 19.1* 12.5* 8.4  --   NEUTROABS  --  16.2*  --   --   --   HGB 7.5* 7.3* 7.5* 6.7* 7.4*  HCT 22.7* 22.0* 22.3* 20.7* 22.4*  MCV 84.7 85.3 84.8 84.8  --   PLT 302 315 342 345  --     Medications:    . sodium chloride   Intravenous Once  . sodium chloride   Intravenous Once  . amitriptyline  50 mg Oral QHS  . citalopram  40 mg Oral QHS  . furosemide  160 mg Intravenous Q8H  . gabapentin  300 mg Oral Daily  . heparin subcutaneous  5,000 Units Subcutaneous Q8H  . pantoprazole  40 mg Oral Daily  . risperiDONE  1 mg Oral QHS  . sodium chloride flush  10-40 mL Intracatheter Q12H      Paulene FloorJames Dhriti Fales, MD 01/16/2016, 12:23 PM

## 2016-01-16 NOTE — Progress Notes (Addendum)
Results for Torbert, Denyce RobertSCOTT D (MRN [id number]) as of 01/16/2016 06:01  Ref. Range 01/16/2016 04:30  Hemoglobin Latest Ref Range: 13.0-17.0 g/dL 6.7 (LL)  MD on call paged. Awaiting return call.  NO new orders.

## 2016-01-16 NOTE — Progress Notes (Signed)
Physical Therapy Treatment Patient Details Name: Jeffery Gross MRN: [id number] DOB: [birth year] Today's Date: 01/16/2016    History of Present Illness Jeffery Gross is a 55 y.o. 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 PMH is significant for chronic back pain, back surgery, HTN, depression and anxiety.     PT Comments    Continuing improvements with mobility; Gaining more repertoire of movement and transfers, worked on lateral scooting today with good progress;   It sounds like our dc planning is dependent on Jeffery Gross's status as to chronic or acute renal failure, and is contingent on the possible need for Outpt HD;   Will continue to follow acutely;   Follow Up Recommendations  CIR (While he would greatly benefit from intensive therapies at CIR, I do understand that dispo will be contingent on renal status)     Equipment Recommendations  Wheelchair (measurements PT);Wheelchair cushion (measurements PT);Other (comment) (perhaps sliding board)    Recommendations for Other Services       Precautions / Restrictions Precautions Precautions: Fall Precaution Comments: B LE wound vacs Restrictions Weight Bearing Restrictions: Yes RLE Weight Bearing: Non weight bearing LLE Weight Bearing: Non weight bearing    Mobility  Bed Mobility Overal bed mobility: Needs Assistance Bed Mobility: Supine to Sit;Rolling Rolling: Supervision;Min guard   Supine to sit: Supervision;HOB elevated     General bed mobility comments: Used bil rails to pull self to long sit in the bed; minguard assist for safety as pt lateral and reciprocal scooted his hips to EOB in prep for Lateral scoot transfer; Used bed rails for stability; Noted good lateral lean R and L  Transfers Overall transfer level: Needs  assistance Equipment used:  (slide board, drop arm BSC) Transfers: Lateral/Scoot Transfers          Lateral/Scoot Transfers: Mod assist;+2 safety/equipment General transfer comment: pt did very well with lateral scoot transfer from bed - BSC - recliner usinf slide board and was able to use B UEs to raise buttocks for therapists to adjust seating and pad underneath pt  Ambulation/Gait                 Stairs            Wheelchair Mobility    Modified Rankin (Stroke Patients Only)       Balance   Sitting-balance support: Bilateral upper extremity supported Sitting balance-Leahy Scale: Fair (approaching Good)                              Cognition Arousal/Alertness: Awake/alert Behavior During Therapy: WFL for tasks assessed/performed Overall Cognitive Status: Within Functional Limits for tasks assessed                      Exercises      General Comments General comments (skin integrity, edema, etc.): Educated pt on phantom pain and desensitization      Pertinent Vitals/Pain Pain Assessment: 0-10 Pain Score: 6  Pain Location: Spasms, pahntom pain Pain Descriptors / Indicators: Burning;Stabbing;Spasm Pain Intervention(s): Monitored during session;Premedicated before session    Home Living                      Prior Function  PT Goals (current goals can now be found in the care plan section) Acute Rehab PT Goals Patient Stated Goal: get better PT Goal Formulation: With patient Time For Goal Achievement: 01/24/16 Potential to Achieve Goals: Good Progress towards PT goals: Progressing toward goals    Frequency  Min 3X/week    PT Plan Current plan remains appropriate    Co-evaluation   Reason for Co-Treatment: Complexity of the patient's impairments (multi-system involvement);For patient/therapist safety   OT goals addressed during session: ADL's and self-care     End of Session Equipment Utilized  During Treatment:  (bed pad) Activity Tolerance: Patient tolerated treatment well Patient left: in chair;with call bell/phone within reach;with nursing/sitter in room     Time: 1002-1047 PT Time Calculation (min) (ACUTE ONLY): 45 min  Charges:  $Therapeutic Activity: 23-37 mins                    G Codes:      Olen PelGarrigan, Harlow Basley Hamff 01/16/2016, 4:39 PM  Van ClinesHolly Jerrel Tiberio, [place]PT  Acute Rehabilitation Services Pager [phone] Office [phone]

## 2016-01-17 ENCOUNTER — Encounter (HOSPITAL_COMMUNITY): Payer: Self-pay | Admitting: General Surgery

## 2016-01-17 DIAGNOSIS — R55 Syncope and collapse: Secondary | ICD-10-CM | POA: Insufficient documentation

## 2016-01-17 LAB — BASIC METABOLIC PANEL
ANION GAP: 10 (ref 5–15)
BUN: 45 mg/dL — AB (ref 6–20)
CALCIUM: 8 mg/dL — AB (ref 8.9–10.3)
CO2: 29 mmol/L (ref 22–32)
CREATININE: 6.73 mg/dL — AB (ref 0.61–1.24)
Chloride: 97 mmol/L — ABNORMAL LOW (ref 101–111)
GFR, EST AFRICAN AMERICAN: 10 mL/min — AB (ref >60)
GFR, EST NON AFRICAN AMERICAN: 8 mL/min — AB (ref >60)
GLUCOSE: 102 mg/dL — AB (ref 65–99)
POTASSIUM: 3.8 mmol/L (ref 3.5–5.1)
SODIUM: 136 mmol/L (ref 135–145)

## 2016-01-17 LAB — HEMOGLOBIN AND HEMATOCRIT, BLOOD
HCT: 20.6 % — ABNORMAL LOW (ref 39.0–52.0)
HEMOGLOBIN: 6.7 g/dL — AB (ref 13.0–17.0)

## 2016-01-17 LAB — PREPARE RBC (CROSSMATCH)

## 2016-01-17 LAB — PATHOLOGIST SMEAR REVIEW

## 2016-01-17 MED ORDER — SODIUM CHLORIDE 0.9 % IV SOLN: Freq: Once | INTRAVENOUS | Status: DC

## 2016-01-17 MED ORDER — HEPARIN SODIUM (PORCINE) 5000 UNIT/ML IJ SOLN
5000.0000 [IU] | Freq: Three times a day (TID) | INTRAMUSCULAR | Status: DC
  Administered 2016-01-19 – 2016-01-23 (×14): 5000 [IU] via SUBCUTANEOUS
  Filled 2016-01-17 (×11): qty 1

## 2016-01-17 MED ORDER — HEPARIN SODIUM (PORCINE) 5000 UNIT/ML IJ SOLN
5000.0000 [IU] | Freq: Three times a day (TID) | INTRAMUSCULAR | Status: AC
  Administered 2016-01-17 (×2): 5000 [IU] via SUBCUTANEOUS
  Filled 2016-01-17 (×2): qty 1

## 2016-01-17 NOTE — Progress Notes (Signed)
Results for Sampley, Denyce RobertSCOTT D (MRN [id number]) as of 01/17/2016 07:00  Ref. Range 01/17/2016 04:36  Hemoglobin Latest Ref Range: 13.0-17.0 g/dL 6.7 (LL)  MD on call paged. Awaiting return call.

## 2016-01-17 NOTE — Consult Note (Signed)
Chief Complaint: acute renal failure  Referring Physician:Dr. Otelia Santee  Supervising Physician: Markus Daft  Patient Status: In-pt   HPI: Jeffery Gross is an 55 y.o. male who was admitted with acute renal failure secondary to rhabdomyolysis after falling and unable to stand for 2 days.  He ended up with compartment syndrome of both lower extremities and underwent fasciotomies and subsequently bilateral BKAs.  He had a temporary HD catheter placed by CCM on 6-18.  A request is now made for conversion of temp to tunneled HD cath incase the patient's renal function does not recover.  Past Medical History:  Past Medical History  Diagnosis Date  . Chest pain   . DDD (degenerative disc disease), cervical   . Back pain   . HTN (hypertension)   . Depression     Past Surgical History:  Past Surgical History  Procedure Laterality Date  . Vasectomy    . Fasciotomy Bilateral 01/03/2016    Procedure: FASCIOTOMY;  Surgeon: Leandrew Koyanagi, MD;  Location: [place];  Service: Orthopedics;  Laterality: Bilateral;  . I&d extremity Bilateral 01/05/2016    Procedure: IRRIGATION AND DEBRIDEMENT BILATERAL LOWER EXTREMITIES; WOUND VAC CHANGE;  Surgeon: Leandrew Koyanagi, MD;  Location: [place];  Service: Orthopedics;  Laterality: Bilateral;  . I&d extremity Left 01/07/2016    Procedure: Irrigation and Debridement of Left Lower Extremity with Application of Wound Vac;  Surgeon: Leandrew Koyanagi, MD;  Location: [place];  Service: Orthopedics;  Laterality: Left;  . Amputation Bilateral 01/09/2016    Procedure: Bilateral Above Knee Amputation, Apply Wound VAC;  Surgeon: Newt Minion, MD;  Location: [place];  Service: Orthopedics;  Laterality: Bilateral;    Family History:  Family History  Problem Relation Age of Onset  . Hypertension Father     Social History:  reports that he has been smoking Cigars.  He does not have any smokeless tobacco history on file. He reports that he drinks alcohol. His drug history is not on  file.  Allergies:  Allergies  Allergen Reactions  . Sulfacetamide Sodium Other (See Comments)    edema    Medications:   Medication List    ASK your doctor about these medications        ALPRAZolam 0.5 MG tablet  Commonly known as:  XANAX  Take 0.5 mg by mouth at bedtime as needed for sleep.     amitriptyline 50 MG tablet  Commonly known as:  ELAVIL  Take 50 mg by mouth at bedtime.     amLODipine 10 MG tablet  Commonly known as:  NORVASC  Take 10 mg by mouth daily.     cetirizine 10 MG tablet  Commonly known as:  ZYRTEC  Take 10 mg by mouth daily.     citalopram 40 MG tablet  Commonly known as:  CELEXA  Take 40 mg by mouth daily.     clonazePAM 0.5 MG tablet  Commonly known as:  KLONOPIN  Take 0.5 mg by mouth 2 (two) times daily as needed for anxiety.     cyclobenzaprine 10 MG tablet  Commonly known as:  FLEXERIL  Take 10 mg by mouth every 8 (eight) hours as needed. Muscle spasms     hydrochlorothiazide 25 MG tablet  Commonly known as:  HYDRODIURIL  Take 25 mg by mouth daily.     montelukast 10 MG tablet  Commonly known as:  SINGULAIR  Take 10 mg by mouth daily as needed. allergies  oxyCODONE 15 MG immediate release tablet  Commonly known as:  ROXICODONE  Take 15 mg by mouth every 8 (eight) hours as needed. pain     pantoprazole 40 MG tablet  Commonly known as:  PROTONIX  Take 40 mg by mouth daily.     risperiDONE 1 MG tablet  Commonly known as:  RISPERDAL  Take 1 mg by mouth at bedtime.        Please HPI for pertinent positives, otherwise complete 10 system ROS negative.  Mallampati Score: MD Evaluation Airway: WNL Heart: WNL Abdomen: WNL Chest/ Lungs: WNL ASA  Classification: 3 Mallampati/Airway Score: Two  Physical Exam: BP 105/58 mmHg  Pulse 90  Temp(Src) 97.8 F (36.6 C) (Oral)  Resp 17  Ht 6\' 1"  (1.854 m)  Wt 205 lb 8 oz (93.214 kg)  BMI 27.12 kg/m2  SpO2 99% Body mass index is 27.12 kg/(m^2). General: pleasant, WD, WN  white male who is laying in bed in NAD HEENT: head is normocephalic, atraumatic.  Sclera are noninjected.  PERRL.  Ears and nose without any masses or lesions.  Mouth is pink. Temp cath present in left IJ Heart: regular, rate, and rhythm.  Normal s1,s2. No obvious murmurs, gallops, or rubs noted.  Palpable radial and pedal pulses bilaterally Lungs: CTAB, no wheezes, rhonchi, or rales noted.  Respiratory effort nonlabored Abd: soft, NT, ND, +BS, no masses, hernias, or organomegaly MS: upper extremities are symmetrical with no cyanosis, clubbing, or edema.  Bilateral BKAs with wound VACs present. Psych: A&Ox3 with an appropriate affect.   Labs: Results for orders placed or performed during the hospital encounter of 01/03/16 (from the past 48 hour(s))  Basic metabolic panel     Status: Abnormal   Collection Time: 01/16/16  4:30 AM  Result Value Ref Range   Sodium 136 135 - 145 mmol/L   Potassium 4.0 3.5 - 5.1 mmol/L   Chloride 99 (L) 101 - 111 mmol/L   CO2 27 22 - 32 mmol/L   Glucose, Bld 107 (H) 65 - 99 mg/dL   BUN 37 (H) 6 - 20 mg/dL   Creatinine, Ser 01/18/16 (H) 0.61 - 1.24 mg/dL   Calcium 7.7 (L) 8.9 - 10.3 mg/dL   GFR calc non Af Amer 9 (L) >60 mL/min   GFR calc Af Amer 11 (L) >60 mL/min    Comment: (NOTE) The eGFR has been calculated using the CKD EPI equation. This calculation has not been validated in all clinical situations. eGFR's persistently <60 mL/min signify possible Chronic Kidney Disease.    Anion gap 10 5 - 15  CBC     Status: Abnormal   Collection Time: 01/16/16  4:30 AM  Result Value Ref Range   WBC 8.4 4.0 - 10.5 K/uL   RBC 2.44 (L) 4.22 - 5.81 MIL/uL   Hemoglobin 6.7 (LL) 13.0 - 17.0 g/dL    Comment: REPEATED TO VERIFY CRITICAL RESULT CALLED TO, READ BACK BY AND VERIFIED WITH: L DESHAZO,RN 01/18/16 0551 WILDERK    HCT 20.7 (L) 39.0 - 52.0 %   MCV 84.8 78.0 - 100.0 fL   MCH 27.0 26.0 - 34.0 pg   MCHC 31.9 30.0 - 36.0 g/dL   RDW 892601 81.5 - 19.0 %   Platelets  345 150 - 400 K/uL  Hemoglobin and hematocrit, blood     Status: Abnormal   Collection Time: 01/16/16 11:29 AM  Result Value Ref Range   Hemoglobin 7.4 (L) 13.0 - 17.0 g/dL   HCT 01/18/16 (L)  39.0 - 52.0 %  Pathologist smear review     Status: None   Collection Time: 01/16/16 11:29 AM  Result Value Ref Range   Path Review Normocytic anemia     Comment: Reviewed by Kalman Drape, M.D. [phone]   Save smear     Status: None   Collection Time: 01/16/16 11:29 AM  Result Value Ref Range   Smear Review SMEAR STAINED AND AVAILABLE FOR REVIEW   Basic metabolic panel     Status: Abnormal   Collection Time: 01/17/16  4:36 AM  Result Value Ref Range   Sodium 136 135 - 145 mmol/L   Potassium 3.8 3.5 - 5.1 mmol/L   Chloride 97 (L) 101 - 111 mmol/L   CO2 29 22 - 32 mmol/L   Glucose, Bld 102 (H) 65 - 99 mg/dL   BUN 45 (H) 6 - 20 mg/dL   Creatinine, Ser 6.73 (H) 0.61 - 1.24 mg/dL   Calcium 8.0 (L) 8.9 - 10.3 mg/dL   GFR calc non Af Amer 8 (L) >60 mL/min   GFR calc Af Amer 10 (L) >60 mL/min    Comment: (NOTE) The eGFR has been calculated using the CKD EPI equation. This calculation has not been validated in all clinical situations. eGFR's persistently <60 mL/min signify possible Chronic Kidney Disease.    Anion gap 10 5 - 15  Hemoglobin and hematocrit, blood     Status: Abnormal   Collection Time: 01/17/16  4:36 AM  Result Value Ref Range   Hemoglobin 6.7 (LL) 13.0 - 17.0 g/dL    Comment: REPEATED TO VERIFY CRITICAL RESULT CALLED TO, READ BACK BY AND VERIFIED WITH: CASTRO,E RN 0655 6.29.17 MCADOO,G    HCT 20.6 (L) 39.0 - 52.0 %    Imaging: No results found.  Assessment/Plan 1. Acute renal failure -we will plan on temp catheter conversion to tunneled catheter tomorrow -NPO p MN -labs have been reviewed Risks and Benefits discussed with the patient including, but not limited to bleeding, infection, vascular injury, pneumothorax which may require chest tube placement, air embolism or  even death All of the patient's questions were answered, patient is agreeable to proceed. Consent signed and in chart.    Thank you for this interesting consult.  I greatly enjoyed meeting Jeffery Gross and look forward to participating in their care.  A copy of this report was sent to the requesting provider on this date.  Electronically Signed: Henreitta Cea 01/17/2016, 1:18 PM   I spent a total of 30 minutes    in face to face in clinical consultation, greater than 50% of which was counseling/coordinating care for acute renal failure

## 2016-01-17 NOTE — Progress Notes (Signed)
[facility] Progress Note    Assessment/ Plan:   55 year old white male with hypertension and chronic pain who presents with rhabdo after his fall and immobility in the setting of narcotics and Klonopin  1. 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 Dialysis requiring, last HD Friday. Responded nicely to furosemide. - UOP very good even with cr still rising but is on high doses of Lasix. - Unclear what true weight is but he certainly still has plenty of fluid onboard by PE. - True EDW may be in the 76-82kg range (based on extrapolations of the weight lost after the b/l AKA's).  - will reassess in the AM for need for dialysis; no absolute indication. - will hold the lasix --> he should be recovering renal function by now if this was diuretic phase of ATN. - Will arrange for VIR to convert to tunneled catheter given the prolonged recovery so far despite good UOP.   - if he has an infection from the temp cath it may lead to a longer period of AKI req dialysis. - likely will need HD on Sat.  2. Hypertension/volume  3. Anemia tx 2 units of  PRBCs today in preparation for conversion to tunneled HD cath in am. 4. S/P Bilat AKAs, for Rehab 5 Scrotal swelling, due to severe 3rd spacing of fluid. Scrotal elevation, dialysis/diuresis, may need to ask GU for help  Subjective:   Minor cough nonproductive. Denies f/c/n/v. Good appetite.   Objective:   BP 105/58 mmHg  Pulse 90  Temp(Src) 97.8 F (36.6 C) (Oral)  Resp 17  Ht 6\' 1"  (1.854 m)  Wt 93.214 kg (205 lb 8 oz)  BMI 27.12 kg/m2  SpO2 99%  Intake/Output Summary (Last 24 hours) at 01/17/16 1034 Last data filed at 01/17/16 0600  Gross per 24 hour  Intake   1638 ml  Output   4150 ml  Net  -2512 ml   Weight change:   Physical Exam: General appearance: alert and cooperative  Chest wall: no tenderness  Neck: LIJ temp Male genitalia: marked swelling   Extremities: B/L AKA's   Imaging: No results found.  Labs: BMET  Recent Labs Lab 01/11/16 0530 01/12/16 0454 01/13/16 0430 01/14/16 0400 01/15/16 0608 01/16/16 0430 01/17/16 0436  NA 132* 132* 135 133* 132* 136 136  K 5.0 4.6 4.3 4.5 4.8 4.0 3.8  CL 94* 97* 98* 101 92* 99* 97*  CO2 27 28 28 24 27 27 29   GLUCOSE 105* 103* 114* 99 93 107* 102*  BUN 72* 44* 30* 41* 58* 37* 45*  CREATININE 9.93* 6.76* 5.35* 6.45* 8.39* 6.05* 6.73*  CALCIUM 7.4* 7.0* 7.3* 6.9* 8.1* 7.7* 8.0*  PHOS 9.3* 6.6* 5.6* 5.4* 6.7*  --   --    CBC  Recent Labs Lab 01/14/16 0825 01/14/16 1550 01/15/16 0608 01/16/16 0430 01/16/16 1129 01/17/16 0436  WBC 17.9* 19.1* 12.5* 8.4  --   --   NEUTROABS  --  16.2*  --   --   --   --   HGB 7.5* 7.3* 7.5* 6.7* 7.4* 6.7*  HCT 22.7* 22.0* 22.3* 20.7* 22.4* 20.6*  MCV 84.7 85.3 84.8 84.8  --   --   PLT 302 315 342 345  --   --     Medications:    . sodium chloride   Intravenous Once  . sodium chloride   Intravenous Once  . sodium chloride   Intravenous Once  .  amitriptyline  50 mg Oral QHS  . citalopram  40 mg Oral QHS  . gabapentin  300 mg Oral Daily  . heparin subcutaneous  5,000 Units Subcutaneous Q8H  . pantoprazole  40 mg Oral Daily  . risperiDONE  1 mg Oral QHS  . sodium chloride flush  10-40 mL Intracatheter Q12H      Paulene FloorJames Latriece Anstine, MD 01/17/2016, 10:34 AM
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
## 2016-01-18 ENCOUNTER — Inpatient Hospital Stay (HOSPITAL_COMMUNITY): Payer: BLUE CROSS/BLUE SHIELD

## 2016-01-18 LAB — RENAL FUNCTION PANEL
ANION GAP: 13 (ref 5–15)
Albumin: 1.9 g/dL — ABNORMAL LOW (ref 3.5–5.0)
BUN: 55 mg/dL — ABNORMAL HIGH (ref 6–20)
CHLORIDE: 94 mmol/L — AB (ref 101–111)
CO2: 30 mmol/L (ref 22–32)
Calcium: 8.5 mg/dL — ABNORMAL LOW (ref 8.9–10.3)
Creatinine, Ser: 7.09 mg/dL — ABNORMAL HIGH (ref 0.61–1.24)
GFR calc non Af Amer: 8 mL/min — ABNORMAL LOW (ref >60)
GFR, EST AFRICAN AMERICAN: 9 mL/min — AB (ref >60)
Glucose, Bld: 97 mg/dL (ref 65–99)
Phosphorus: 8 mg/dL — ABNORMAL HIGH (ref 2.5–4.6)
Potassium: 4 mmol/L (ref 3.5–5.1)
Sodium: 137 mmol/L (ref 135–145)

## 2016-01-18 LAB — TYPE AND SCREEN
ABO/RH(D): O POS
Antibody Screen: NEGATIVE
Unit division: 0
Unit division: 0

## 2016-01-18 LAB — HEMOGLOBIN AND HEMATOCRIT, BLOOD
HCT: 27.7 % — ABNORMAL LOW (ref 39.0–52.0)
Hemoglobin: 8.9 g/dL — ABNORMAL LOW (ref 13.0–17.0)

## 2016-01-18 MED ORDER — FENTANYL CITRATE (PF) 100 MCG/2ML IJ SOLN
INTRAMUSCULAR | Status: AC | PRN
  Administered 2016-01-18: 100 ug via INTRAVENOUS

## 2016-01-18 MED ORDER — MIDAZOLAM HCL 2 MG/2ML IJ SOLN
INTRAMUSCULAR | Status: AC | PRN
  Administered 2016-01-18 (×2): 1 mg via INTRAVENOUS

## 2016-01-18 MED ORDER — FENTANYL CITRATE (PF) 100 MCG/2ML IJ SOLN
INTRAMUSCULAR | Status: AC
  Filled 2016-01-18: qty 2

## 2016-01-18 MED ORDER — LIDOCAINE HCL 1 % IJ SOLN
INTRAMUSCULAR | Status: AC
  Filled 2016-01-18: qty 20

## 2016-01-18 MED ORDER — HEPARIN SODIUM (PORCINE) 1000 UNIT/ML IJ SOLN
INTRAMUSCULAR | Status: AC
  Filled 2016-01-18: qty 1

## 2016-01-18 MED ORDER — CEFAZOLIN SODIUM-DEXTROSE 2-4 GM/100ML-% IV SOLN
INTRAVENOUS | Status: AC
  Filled 2016-01-18: qty 100

## 2016-01-18 MED ORDER — PRO-STAT SUGAR FREE PO LIQD
30.0000 mL | Freq: Two times a day (BID) | ORAL | Status: DC
  Administered 2016-01-19 – 2016-01-23 (×9): 30 mL via ORAL
  Filled 2016-01-18 (×9): qty 30

## 2016-01-18 MED ORDER — CEFAZOLIN SODIUM-DEXTROSE 2-4 GM/100ML-% IV SOLN
2.0000 g | INTRAVENOUS | Status: AC
  Administered 2016-01-18: 2 g via INTRAVENOUS
  Filled 2016-01-18: qty 100

## 2016-01-18 MED ORDER — MIDAZOLAM HCL 2 MG/2ML IJ SOLN
INTRAMUSCULAR | Status: AC
  Filled 2016-01-18: qty 2

## 2016-01-18 NOTE — Sedation Documentation (Signed)
Patient denies pain and is resting comfortably.  

## 2016-01-18 NOTE — Progress Notes (Signed)
Physical Therapy Treatment Patient Details Name: Rip HarbourScott D Koehne MRN: [id number] DOB: [birth year] Today's Date: 01/18/2016    History of Present Illness Rip HarbourScott D Highley is a 55 y.o. 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 PMH is significant for chronic back pain, back surgery, HTN, depression and anxiety.     PT Comments    The patient is improving in balance and mobility. Very participatory in  All aspects of rehab and self care. Recommend CIR.  Follow Up Recommendations  CIR     Equipment Recommendations  Wheelchair (measurements PT);Wheelchair cushion (measurements PT);Other (comment)    Recommendations for Other Services Rehab consult     Precautions / Restrictions Precautions Precautions: Fall Precaution Comments: B LE wound vacs    Mobility  Bed Mobility   Bed Mobility: Supine to Sit;Rolling;Sit to Supine Rolling: Modified independent (Device/Increase time) Sidelying to sit: Supervision Supine to sit: Supervision Sit to supine: Supervision   General bed mobility comments: Used bil rails to pull self to long sit in the bed;  Used bed rails for stability; Noted good lateral lean R and L, worked on rotating 180 degrees to L and R in prep for  transfer set up. Prrcticed weight shifting without UE' support. Performed Sitting balance activities without UE support. Balances well and weight shifts without UE support.  Transfers                    Ambulation/Gait                 Stairs            Wheelchair Mobility    Modified Rankin (Stroke Patients Only)       Balance   Sitting-balance support: No upper extremity supported Sitting balance-Leahy Scale: Good Sitting balance - Comments: Able to come to fully upright sitting, and work on lateral leans with  elbow prop and even opposite hip hike,                             Cognition Arousal/Alertness: Awake/alert                          Exercises      General Comments        Pertinent Vitals/Pain Pain Score: 7  Pain Location: phantom pain Pain Descriptors / Indicators: Penetrating Pain Intervention(s): Monitored during session;Patient requesting pain meds-RN notified    Home Living                      Prior Function            PT Goals (current goals can now be found in the care plan section) Progress towards PT goals: Progressing toward goals    Frequency  Min 3X/week    PT Plan Current plan remains appropriate    Co-evaluation             End of Session   Activity Tolerance: Patient tolerated treatment well Patient left: in bed;with call bell/phone within reach     Time: 1015-1051 PT Time Calculation (min) (ACUTE ONLY): 36 min  Charges:  $Therapeutic Activity: 23-37 mins  G Codes:      Rada HayHill, Suhail Peloquin Elizabeth 01/18/2016, 11:06 AM Blanchard KelchKaren Latrease Kunde PT [phone]

## 2016-01-18 NOTE — Procedures (Signed)
R IJ tunneled HD cath placement No complication No blood loss. See complete dictation in [facility] PACS.  

## 2016-01-18 NOTE — Clinical Social Work Note (Addendum)
Patient is bilateral AKA and will need rehab at discharge. Per nurse case manager, insurance has denied LTAC. Assessment will be completed with patient for SNF placement. Per nephrology, patient is currently in acute renal failure and may continue to receive dialysis for his ARF on an outpatient basis. CSW will continue to monitor patient's progress and assist with d/c planning to a skilled facility.  Genelle BalVanessa Ofelia Podolski, MSW, LCSW Licensed Clinical Social Worker Clinical Social Work Department [facility] [phone]

## 2016-01-18 NOTE — [place] FL2 (Signed)
[place] MEDICAID FL2 LEVEL OF CARE SCREENING TOOL     IDENTIFICATION  Patient Name: Jeffery Gross Birthdate: 06/03/1961 Sex: male Admission Date (Current Location): 01/03/2016  [facility] [place]  [place]   Facility and Address:  [facility] [place], [address], [place], [place] [address]      Provider Number: [id number]  Attending Physician Name and Address:  Carney LivingMarshall L Chambliss, MD  Relative Name and Phone Number:       Current Level of Care: Hospital Recommended Level of Care: Skilled Nursing Facility Prior Approval Number:    Date Approved/Denied:   [facility]:  [facility] WJ#[id number])  Discharge Plan: SNF (Insurance denied LTAC)    Current Diagnoses: Patient Active Problem List   Diagnosis Date Noted  . Syncope   . Pressure ulcer 01/12/2016  . Fall   . Encounter for central line placement   . Urinary retention   . AKI (acute kidney injury) (HCC)   . Traumatic rhabdomyolysis (HCC)   . Rhabdomyolysis 01/03/2016  . Nontraumatic compartment syndrome of leg 01/03/2016  . Chest pain 10/27/2012  . HTN (hypertension) 10/27/2012  . Back pain 10/27/2012    Orientation RESPIRATION BLADDER Height & Weight     Self, Time, Situation, Place  Normal Continent (Urethral catheter placed 6/16) Weight: 205 lb 8 oz (93.214 kg) Height:  6\' 1"  (185.4 cm)  BEHAVIORAL SYMPTOMS/MOOD NEUROLOGICAL BOWEL NUTRITION STATUS      Continent Diet (Renal/carb modified with fluid restriction)  AMBULATORY STATUS COMMUNICATION OF NEEDS Skin   Total Care (Patient is bilateral AKA) Verbally Wound Vac (Patient had bilateral above-the-knee amputations on 6/21 and has wound vacs to both stumps. Stage 2 pressue ulcers to right/left thigh; posterior scrotum; sacrum.)                       Personal Care Assistance Level of Assistance  Bathing, Feeding, Dressing Bathing Assistance: Maximum assistance Feeding assistance: Independent  (Needs assistance with set-up ) Dressing Assistance: Maximum assistance     Functional Limitations Info  Sight, Hearing, Speech Sight Info: Adequate Hearing Info: Adequate Speech Info: Adequate    SPECIAL CARE FACTORS FREQUENCY  PT (By licensed PT), OT (By licensed OT)     PT Frequency: Evaluated 6/22 and a minimum of 3X per week recommended OT Frequency: Evaluated 6/22 and a minimum of 2X per week recommended            Contractures Contractures Info: Not present    Additional Factors Info  Code Status, Allergies Code Status Info: Full Code Allergies Info: Sulfacetamide sodium           Current Medications (01/18/2016):  This is the current hospital active medication list Current Facility-Administered Medications  Medication Dose Route Frequency Provider Last Rate Last Dose  . 0.9 %  sodium chloride infusion   Intravenous Continuous Kipp Broodavid Joslin, MD   Stopped at 01/10/16 0700  . 0.9 %  sodium chloride infusion   Intravenous Once Casey BurkittHillary Moen Fitzgerald, MD      . 0.9 %  sodium chloride infusion   Intravenous Once Lauris PoagAlvin C Powell, MD      . 0.9 %  sodium chloride infusion   Intravenous Once Ethelene HalJames W Lin, MD      . 0.9 %  sodium chloride infusion   Intravenous Once Casey BurkittHillary Moen Fitzgerald, MD      . acetaminophen (TYLENOL) tablet 650 mg  650 mg Oral Q6H PRN  Nadara MustardMarcus V Duda, MD   650 mg at 01/16/16 1909   Or  . acetaminophen (TYLENOL) suppository 650 mg  650 mg Rectal Q6H PRN Nadara MustardMarcus V Duda, MD      . amitriptyline (ELAVIL) tablet 50 mg  50 mg Oral QHS Pincus LargeJazma Y Phelps, DO   50 mg at 01/17/16 2224  . ceFAZolin (ANCEF) 2-4 GM/100ML-% IVPB           . citalopram (CELEXA) tablet 40 mg  40 mg Oral QHS Carney LivingMarshall L Chambliss, MD   40 mg at 01/17/16 2224  . diphenhydrAMINE (BENADRYL) capsule 25 mg  25 mg Oral Q6H PRN Pincus LargeJazma Y Phelps, DO   25 mg at 01/13/16 2303  . [START ON 01/19/2016] feeding supplement (PRO-STAT SUGAR FREE 64) liquid 30 mL  30 mL Oral BID Carney LivingMarshall L Chambliss, MD      .  fentaNYL (SUBLIMAZE) 100 MCG/2ML injection           . fentaNYL (SUBLIMAZE) injection 75 mcg  75 mcg Intravenous Q4H PRN Palma HolterKanishka G Gunadasa, MD   75 mcg at 01/16/16 1153  . gabapentin (NEURONTIN) tablet 300 mg  300 mg Oral Daily Palma HolterKanishka G Gunadasa, MD   300 mg at 01/18/16 1115  . heparin 1000 UNIT/ML injection           . [START ON 01/19/2016] heparin injection 5,000 Units  5,000 Units Subcutaneous Q8H Barnetta ChapelKelly Osborne, PA-C      . lidocaine (XYLOCAINE) 1 % (with pres) injection           . LORazepam (ATIVAN) injection 1 mg  1 mg Intravenous Q4H PRN Bonney AidAlyssa A Haney, MD   1 mg at 01/10/16 0312  . methocarbamol (ROBAXIN) tablet 500 mg  500 mg Oral Q6H PRN Nadara MustardMarcus V Duda, MD   500 mg at 01/17/16 2224   Or  . methocarbamol (ROBAXIN) 500 mg in dextrose 5 % 50 mL IVPB  500 mg Intravenous Q6H PRN Nadara MustardMarcus V Duda, MD      . metoCLOPramide (REGLAN) tablet 5-10 mg  5-10 mg Oral Q8H PRN Nadara MustardMarcus V Duda, MD       Or  . metoCLOPramide (REGLAN) injection 5-10 mg  5-10 mg Intravenous Q8H PRN Nadara MustardMarcus V Duda, MD      . midazolam (VERSED) 2 MG/2ML injection           . ondansetron (ZOFRAN) tablet 4 mg  4 mg Oral Q6H PRN Nadara MustardMarcus V Duda, MD       Or  . ondansetron [facility](ZOFRAN) injection 4 mg  4 mg Intravenous Q6H PRN Nadara MustardMarcus V Duda, MD      . oxyCODONE (Oxy IR/ROXICODONE) immediate release tablet 10 mg  10 mg Oral Q8H PRN Lora Havensaleigh N Rumley, DO   10 mg at 01/17/16 2224  . pantoprazole (PROTONIX) EC tablet 40 mg  40 mg Oral Daily Pincus LargeJazma Y Phelps, DO   40 mg at 01/18/16 1115  . polyethylene glycol (MIRALAX / GLYCOLAX) packet 17 g  17 g Oral Daily PRN Campbell StallKaty Dodd Mayo, MD      . risperiDONE (RISPERDAL) tablet 1 mg  1 mg Oral QHS Pincus LargeJazma Y Phelps, DO   1 mg at 01/17/16 2224  . senna-docusate (Senokot-S) tablet 1 tablet  1 tablet Oral QHS PRN Campbell StallKaty Dodd Mayo, MD   1 tablet at 01/16/16 2142  . sodium chloride flush (NS) 0.9 % injection 10-40 mL  10-40 mL Intracatheter Q12H Carney LivingMarshall L Chambliss, MD   10 mL at 01/18/16 0530  Discharge  Medications: Please see discharge summary for a list of discharge medications.  Relevant Imaging Results:  Relevant Lab Results:   Additional Information ss#[id number]. Mr. Drab is in acute renal failure and is currently receiving dialysis in the hospital. Per nephrology, he may continue to receive dialysis on an outpatient basis.  Cristobal Goldmann, LCSW

## 2016-01-18 NOTE — Progress Notes (Signed)
Nutrition Follow-up  DOCUMENTATION CODES:   Not applicable  INTERVENTION:  Once diet advances, provide 30 ml Prostat po BID, each supplement provides 100 kcal and 15 grams of protein.   NUTRITION DIAGNOSIS:   Increased nutrient needs related to wound healing as evidenced by estimated needs; ongoing  GOAL:   Patient will meet greater than or equal to 90% of their needs; not met currently  MONITOR:   PO intake, Supplement acceptance, Labs, Weight trends, Skin, I & O's  REASON FOR ASSESSMENT:   Malnutrition Screening Tool    ASSESSMENT:   Jeffery Gross is a 55 y.o. male presenting with fall found to have rhabdomyolysis and right DVT. PMH is significant for chronic back pain, HTN, depression and anxiety.  Pt admitted with rhabdomyolysis, DVT, and bilateral lower leg compartment syndrome. Nephrology following. Pt with AKI due to ATN from rhabdomyolysis. Pt underwent HD cath placement on 01/07/16 and HD was initiated.   S/p Procedure 12/1515:  1. Bilateral lower leg Four-compartment fasciotomy bilateral lower leg 2. Application of wound vac >50 sq cm  S/p PROCEDURE on 01/05/16 and 01/07/16: 1. Irrigation and debridement of muscle and skin of left lower extremity medial wound 35 cm x 7 cm x 2 cm; lateral wound 15 cm x 5 cm x 1 cm 2. Application of wound VAC greater than 50 cm  S/p PROCEDURE 6/21: Bilateral Above Knee Amputation, Apply Wound VAC 2  Pt is currently NPO for procedure today. IR to convert temp cath to tunneled cath today. HD likely tomorrow. Meal completion prior to NPO has been 85-100%. RD to order Prostat to aid in healing. Nursing staff to provide once diet advances.  Phosphorous elevated at 8.0. Diet Order:  Diet NPO time specified Except for: Sips with Meds  Skin:  Wound (see comment) (Stage II on R thigh, sacrum, scrotom; bilat AKA w/ VACs)  Last BM:  6/27  Height:   Ht Readings from Last 1 Encounters:  01/04/16 _0  (1.854 m)    Weight:   Wt  Readings from Last 1 Encounters:  01/15/16 205 lb 8 oz (93.214 kg)    Ideal Body Weight:  86.4 kg  BMI:  Body mass index is 27.12 kg/(m^2).  Estimated Nutritional Needs:   Kcal:  2200-2400  Protein:  120-135 grams  Fluid:  1.2 L/day  EDUCATION NEEDS:   Education needs addressed  Corrin Parker, MS, RD, LDN Pager # [phone] After hours/ weekend pager # [phone]
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
## 2016-01-18 NOTE — Progress Notes (Addendum)
[facility] Progress Note    Assessment/ Plan:   55 year old white male with hypertension and chronic pain who presents with rhabdo after his fall and immobility in the setting of narcotics and Klonopin  1. 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 Dialysis requiring, last HD Friday. Responded nicely to furosemide. - UOP very good even with cr still rising but is on high doses of Lasix. - Unclear what true weight is but he certainly still has plenty of fluid onboard by PE. - True EDW may be in the 76-82kg range (based on extrapolations of the weight lost after the b/l AKA's).  - no absolute indication for dialysis today but also no e/o renal recovery. Will check another BMP in the AM but almost certainly will need dialysis in the AM for clearance (no UF).  - will continue to hold the lasix (d/c on 6/29) --> he should be recovering renal function by now if this was diuretic phase of ATN. - appreciate VIR scheduling to convert to tunneled catheter given the higher risk of infection with the temp and possible prolonged recovery. Fortunately he still has great good UOP. - if he has an infection from the temp cath it may lead to a longer period of AKI req dialysis. - likely will need HD on Sat.  * HE SHOULD BE CONSIDERED ACUTE RENAL FAILURE WHEN HE IS DISCHARGED FOR OUTPATIENT DIALYSIS IF HE STILL REQUIRES RENAL REPLACEMENT THERAPY. *  2. Hypertension/volume  3. Anemia tx 2 units of PRBCs (6/29) in preparation for conversion to tunneled HD cath --> appropriate response in h/h.  4. S/P Bilat AKAs, for Rehab 5 Scrotal swelling, due to severe 3rd spacing of fluid. Scrotal elevation, dialysis/diuresis, may need to ask GU for help  Subjective:   He thinks the scrotal edema is a little better.  Denies f/c/n/v. Good appetite.   Objective:   BP 115/59 mmHg  Pulse 70  Temp(Src) 97.7 F (36.5 C) (Oral)  Resp 19   Ht 6\' 1"  (1.854 m)  Wt 93.214 kg (205 lb 8 oz)  BMI 27.12 kg/m2  SpO2 93%  Intake/Output Summary (Last 24 hours) at 01/18/16 [id number] Last data filed at 01/18/16 0556  Gross per 24 hour  Intake   1004 ml  Output   5725 ml  Net  -4721 ml   Weight change:   Physical Exam: General appearance: alert and cooperative  Chest wall: no tenderness  Neck: LIJ temp Male genitalia: marked swelling  Extremities: B/L AKA's Positive foley  Imaging: No results found.  Labs: BMET  Recent Labs Lab 01/12/16 0454 01/13/16 0430 01/14/16 0400 01/15/16 0608 01/16/16 0430 01/17/16 0436 01/18/16 0530  NA 132* 135 133* 132* 136 136 137  K 4.6 4.3 4.5 4.8 4.0 3.8 4.0  CL 97* 98* 101 92* 99* 97* 94*  CO2 28 28 24 27 27 29 30   GLUCOSE 103* 114* 99 93 107* 102* 97  BUN 44* 30* 41* 58* 37* 45* 55*  CREATININE 6.76* 5.35* 6.45* 8.39* 6.05* 6.73* 7.09*  CALCIUM 7.0* 7.3* 6.9* 8.1* 7.7* 8.0* 8.5*  PHOS 6.6* 5.6* 5.4* 6.7*  --   --  8.0*   CBC  Recent Labs Lab 01/14/16 0825 01/14/16 1550 01/15/16 0608 01/16/16 0430 01/16/16 1129 01/17/16 0436 01/18/16 0530  WBC 17.9* 19.1* 12.5* 8.4  --   --   --   NEUTROABS  --  16.2*  --   --   --   --   --  HGB 7.5* 7.3* 7.5* 6.7* 7.4* 6.7* 8.9*  HCT 22.7* 22.0* 22.3* 20.7* 22.4* 20.6* 27.7*  MCV 84.7 85.3 84.8 84.8  --   --   --   PLT 302 315 342 345  --   --   --     Medications:    . sodium chloride   Intravenous Once  . sodium chloride   Intravenous Once  . sodium chloride   Intravenous Once  . sodium chloride   Intravenous Once  . amitriptyline  50 mg Oral QHS  . citalopram  40 mg Oral QHS  . gabapentin  300 mg Oral Daily  . [START ON 01/19/2016] heparin subcutaneous  5,000 Units Subcutaneous Q8H  . pantoprazole  40 mg Oral Daily  . risperiDONE  1 mg Oral QHS  . sodium chloride flush  10-40 mL Intracatheter Q12H      Jeffery FloorJames Jakiera Ehler, MD 01/18/2016, 8:12 AM

## 2016-01-19 DIAGNOSIS — N179 Acute kidney failure, unspecified: Secondary | ICD-10-CM | POA: Insufficient documentation

## 2016-01-19 LAB — BASIC METABOLIC PANEL
Anion gap: 15 (ref 5–15)
BUN: 62 mg/dL — AB (ref 6–20)
CHLORIDE: 94 mmol/L — AB (ref 101–111)
CO2: 30 mmol/L (ref 22–32)
Calcium: 8.9 mg/dL (ref 8.9–10.3)
Creatinine, Ser: 6.63 mg/dL — ABNORMAL HIGH (ref 0.61–1.24)
GFR calc Af Amer: 10 mL/min — ABNORMAL LOW (ref >60)
GFR calc non Af Amer: 8 mL/min — ABNORMAL LOW (ref >60)
GLUCOSE: 106 mg/dL — AB (ref 65–99)
POTASSIUM: 3.9 mmol/L (ref 3.5–5.1)
Sodium: 139 mmol/L (ref 135–145)

## 2016-01-19 LAB — HEMOGLOBIN AND HEMATOCRIT, BLOOD
HCT: 29.2 % — ABNORMAL LOW (ref 39.0–52.0)
Hemoglobin: 9.4 g/dL — ABNORMAL LOW (ref 13.0–17.0)

## 2016-01-19 MED ORDER — FENTANYL CITRATE (PF) 100 MCG/2ML IJ SOLN
INTRAMUSCULAR | Status: AC
  Filled 2016-01-19: qty 2

## 2016-01-19 MED ORDER — OXYCODONE HCL 5 MG PO TABS
ORAL_TABLET | ORAL | Status: AC
  Filled 2016-01-19: qty 2
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
## 2016-01-19 NOTE — Clinical Social Work Note (Signed)
Clinical Social Work Assessment  Patient Details  Name: Jeffery Gross MRN: [id number] Date of Birth: 07-13-1961  Date of referral:  02/08/16               Reason for consult:  Facility Placement, Discharge Planning                Permission sought to share information with:  Chartered certified accountant granted to share information::  Yes, Verbal Permission Granted  Name::        Agency::  SNF's  Relationship::     Contact Information:     Housing/Transportation Living arrangements for the past 2 months:  Single Family Home Source of Information:  Patient, Medical Team Patient Interpreter Needed:  None Criminal Activity/Legal Involvement Pertinent to Current Situation/Hospitalization:  No - Comment as needed Significant Relationships:  Parents, Friend Lives with:  Roommate Do you feel safe going back to the place where you live?  Yes Need for family participation in patient care:  Yes (Comment)  Care giving concerns:  PT recommending CIR but patient medically stable.   Social Worker assessment / plan:  CSW met with patient. No supports at bedside. CSW introduced role and explained that discharge planning would be discussed. CSW discussed CIR recommendation and SNF process. Patient prefers CIR but if he goes to SNF, [facility] is first preference. Will fax out to other SNF's in [facility] area. CSW provided SNF list. Patient stated that he did not want to look at it right now but his mother would when she came by. No further concerns. CSW encouraged patient to contact CSW as needed. CSW will continue to follow patient and facilitate discharge to SNF if CIR does not take patient. Patient's PASRR number still pending and extra information has not been requested yet. Will need insurance authorization as well.   Employment status:  Unemployed Forensic scientist:  Other (Comment Required) Nurse, mental health) PT Recommendations:  Inpatient Rehab Consult Information / Referral to  community resources:  [name]  Patient/Family's Response to care:  Patient agreeable to SNF placement if CIR will not accept him. Patient's mother supportive and involved in patient's care. Patient appreciated social work intervention.  Patient/Family's Understanding of and Emotional Response to Diagnosis, Current Treatment, and Prognosis:  Patient understands need for rehab before returning home. Preference is for CIR or [facility] as he has already developed relationships with their staff (per patient).  Emotional Assessment Appearance:  Appears stated age Attitude/Demeanor/Rapport:   (Pleasant) Affect (typically observed):  Accepting, Appropriate, Calm, Pleasant Orientation:  Oriented to Self, Oriented to Place, Oriented to  Time, Oriented to Situation Alcohol / Substance use:  Never Used Psych involvement (Current and /or in the community):  No (Comment)  Discharge Needs  Concerns to be addressed:  Care Coordination Readmission within the last 30 days:  No Current discharge risk:  Dependent with Mobility, Physical Impairment Barriers to Discharge:  [place], LCSW 01/19/2016, 2:21 PM

## 2016-01-19 NOTE — Progress Notes (Signed)
[facility] Progress Note    Assessment/ Plan:   [age] year old white male with hypertension and chronic pain who presents with rhabdo after his fall and immobility in the setting of narcotics and Klonopin  1. 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  - UOP very good and for 1st time Creatinine may be stabilizing. - True EDW may be in the 76-82kg range (based on extrapolations of the weight lost after the b/l AKA's).  - Seen on HD today @1050am   - 4K bath  RIJ TC  - No complaints.  - No UF  - He may finally be turning the corner --> continue holding the lasix (d/c on 6/29) --> he may be entering the diuretic phase of ATN.  * HE SHOULD BE CONSIDERED ACUTE RENAL FAILURE WHEN HE IS DISCHARGED FOR OUTPATIENT DIALYSIS IF HE STILL REQUIRES RENAL REPLACEMENT THERAPY. * * [facility] to watch carefully for renal recovery. i don't think he's far from starting to recover.  * Do not d/c on lasix.  2. Hypertension/volume  3. Anemia tx 2 units of PRBCs (6/29) in preparation for conversion to tunneled HD cath --> appropriate response in h/h.  4. S/P Bilat AKAs, for Rehab 5 Scrotal swelling, due to severe 3rd spacing of fluid. Scrotal elevation, dialysis/diuresis, may need to ask GU for help  Subjective:   He thinks the scrotal edema is a little better.  Denies f/c/n/v. Good appetite.  Denies dyspnea.          Objective:   BP 112/72 mmHg  Pulse 73  Temp(Src) 98.2 F (36.8 C) (Oral)  Resp 18  Ht 6\' 1"  (1.854 m)  Wt 79.3 kg (174 lb 13.2 oz)  BMI 23.07 kg/m2  SpO2 91%  Intake/Output Summary (Last 24 hours) at 01/19/16 1108 Last data filed at 01/19/16 0602  Gross per 24 hour  Intake    840 ml  Output   4950 ml  Net  -4110 ml   Weight change:   Physical Exam: General appearance: alert and cooperative  Chest wall: no tenderness  Neck: LIJ temp Male genitalia:  marked swelling  Extremities: B/L AKA's  Imaging: Ir Fluoro Guide Cv Line Right  01/18/2016  CLINICAL DATA:  Acute kidney injury, needs access for hemodialysis EXAM: TUNNELED HEMODIALYSIS CATHETER PLACEMENT WITH ULTRASOUND AND FLUOROSCOPIC GUIDANCE TECHNIQUE: The procedure, risks, benefits, and alternatives were explained to the patient. Questions regarding the procedure were encouraged and answered. The patient understands and consents to the procedure. As antibiotic prophylaxis, cefazolin 2 g was ordered pre-procedure and administered intravenously within one hour of incision.Patency of the right IJ vein was confirmed with ultrasound with image documentation. An appropriate skin site was determined. Region was prepped using maximum barrier technique including cap and mask, sterile gown, sterile gloves, large sterile sheet, and Chlorhexidine as cutaneous antisepsis. The region was infiltrated locally with 1% lidocaine. Intravenous Fentanyl and Versed were administered as conscious sedation during continuous monitoring of the patient's level of consciousness and physiological / cardiorespiratory status by the radiology RN, with a total moderate sedation time of 15 minutes. Under real-time ultrasound guidance, the right IJ vein was accessed with a 21 gauge micropuncture needle; the needle tip within the vein was confirmed with ultrasound image documentation. Needle exchanged over the 018 guidewire for transitional dilator, which allowed advancement of a Benson wire into the IVC. Over this, an MPA catheter was advanced. A Palindrome 23 hemodialysis catheter  was tunneled from the right anterior chest wall approach to the right IJ dermatotomy site. The MPA catheter was exchanged over an Amplatz wire for serial vascular dilators which allow placement of a peel-away sheath, through which the catheter was advanced under intermittent fluoroscopy, positioned with its tips in the proximal and midright atrium. Spot chest  radiograph confirms good catheter position. No pneumothorax. Catheter was flushed and primed per protocol. Catheter secured externally with O Prolene sutures. The right IJ dermatotomy site was closed with Dermabond. COMPLICATIONS: COMPLICATIONS None immediate FLUOROSCOPY TIME:  18 seconds, 2 mGy COMPARISON:  None IMPRESSION: 1. Technically successful placement of tunneled right IJ hemodialysis catheter with ultrasound and fluoroscopic guidance. Ready for routine use. ACCESS: Remains approachable for percutaneous intervention as needed. Electronically Signed   By: Corlis Leak  Hassell M.D.   On: 01/18/2016 14:36   Ir [place] Guide Vasc Access Right  01/18/2016  CLINICAL DATA:  Acute kidney injury, needs access for hemodialysis EXAM: TUNNELED HEMODIALYSIS CATHETER PLACEMENT WITH ULTRASOUND AND FLUOROSCOPIC GUIDANCE TECHNIQUE: The procedure, risks, benefits, and alternatives were explained to the patient. Questions regarding the procedure were encouraged and answered. The patient understands and consents to the procedure. As antibiotic prophylaxis, cefazolin 2 g was ordered pre-procedure and administered intravenously within one hour of incision.Patency of the right IJ vein was confirmed with ultrasound with image documentation. An appropriate skin site was determined. Region was prepped using maximum barrier technique including cap and mask, sterile gown, sterile gloves, large sterile sheet, and Chlorhexidine as cutaneous antisepsis. The region was infiltrated locally with 1% lidocaine. Intravenous Fentanyl and Versed were administered as conscious sedation during continuous monitoring of the patient's level of consciousness and physiological / cardiorespiratory status by the radiology RN, with a total moderate sedation time of 15 minutes. Under real-time ultrasound guidance, the right IJ vein was accessed with a 21 gauge micropuncture needle; the needle tip within the vein was confirmed with ultrasound image documentation.  Needle exchanged over the 018 guidewire for transitional dilator, which allowed advancement of a Benson wire into the IVC. Over this, an MPA catheter was advanced. A Palindrome 23 hemodialysis catheter was tunneled from the right anterior chest wall approach to the right IJ dermatotomy site. The MPA catheter was exchanged over an Amplatz wire for serial vascular dilators which allow placement of a peel-away sheath, through which the catheter was advanced under intermittent fluoroscopy, positioned with its tips in the proximal and midright atrium. Spot chest radiograph confirms good catheter position. No pneumothorax. Catheter was flushed and primed per protocol. Catheter secured externally with O Prolene sutures. The right IJ dermatotomy site was closed with Dermabond. COMPLICATIONS: COMPLICATIONS None immediate FLUOROSCOPY TIME:  18 seconds, 2 mGy COMPARISON:  None IMPRESSION: 1. Technically successful placement of tunneled right IJ hemodialysis catheter with ultrasound and fluoroscopic guidance. Ready for routine use. ACCESS: Remains approachable for percutaneous intervention as needed. Electronically Signed   By: Corlis Leak  Hassell M.D.   On: 01/18/2016 14:36    Labs: BMET  Recent Labs Lab 01/13/16 0430 01/14/16 0400 01/15/16 0608 01/16/16 0430 01/17/16 0436 01/18/16 0530 01/19/16 0527  NA 135 133* 132* 136 136 137 139  K 4.3 4.5 4.8 4.0 3.8 4.0 3.9  CL 98* 101 92* 99* 97* 94* 94*  CO2 28 24 27 27 29 30 30   GLUCOSE 114* 99 93 107* 102* 97 106*  BUN 30* 41* 58* 37* 45* 55* 62*  CREATININE 5.35* 6.45* 8.39* 6.05* 6.73* 7.09* 6.63*  CALCIUM 7.3* 6.9* 8.1* 7.7* 8.0* 8.5*  8.9  PHOS 5.6* 5.4* 6.7*  --   --  8.0*  --    CBC  Recent Labs Lab 01/14/16 0825 01/14/16 1550 01/15/16 0608 01/16/16 0430 01/16/16 1129 01/17/16 0436 01/18/16 0530 01/19/16 0527  WBC 17.9* 19.1* 12.5* 8.4  --   --   --   --   NEUTROABS  --  16.2*  --   --   --   --   --   --   HGB 7.5* 7.3* 7.5* 6.7* 7.4* 6.7* 8.9*  9.4*  HCT 22.7* 22.0* 22.3* 20.7* 22.4* 20.6* 27.7* 29.2*  MCV 84.7 85.3 84.8 84.8  --   --   --   --   PLT 302 315 342 345  --   --   --   --     Medications:    . sodium chloride   Intravenous Once  . sodium chloride   Intravenous Once  . sodium chloride   Intravenous Once  . sodium chloride   Intravenous Once  . amitriptyline  50 mg Oral QHS  . citalopram  40 mg Oral QHS  . feeding supplement (PRO-STAT SUGAR FREE 64)  30 mL Oral BID  . gabapentin  300 mg Oral Daily  . heparin subcutaneous  5,000 Units Subcutaneous Q8H  . pantoprazole  40 mg Oral Daily  . risperiDONE  1 mg Oral QHS  . sodium chloride flush  10-40 mL Intracatheter Q12H      Paulene Floor, MD 01/19/2016, 11:08 AM

## 2016-01-20 DIAGNOSIS — Z89612 Acquired absence of left leg above knee: Secondary | ICD-10-CM

## 2016-01-20 DIAGNOSIS — Z89611 Acquired absence of right leg above knee: Secondary | ICD-10-CM | POA: Insufficient documentation

## 2016-01-20 DIAGNOSIS — G546 Phantom limb syndrome with pain: Secondary | ICD-10-CM | POA: Insufficient documentation

## 2016-01-20 LAB — RENAL FUNCTION PANEL
ALBUMIN: 2.1 g/dL — AB (ref 3.5–5.0)
Anion gap: 12 (ref 5–15)
BUN: 37 mg/dL — AB (ref 6–20)
CO2: 29 mmol/L (ref 22–32)
CREATININE: 3.8 mg/dL — AB (ref 0.61–1.24)
Calcium: 8.9 mg/dL (ref 8.9–10.3)
Chloride: 98 mmol/L — ABNORMAL LOW (ref 101–111)
GFR calc Af Amer: 19 mL/min — ABNORMAL LOW (ref >60)
GFR calc non Af Amer: 17 mL/min — ABNORMAL LOW (ref >60)
GLUCOSE: 101 mg/dL — AB (ref 65–99)
PHOSPHORUS: 5.5 mg/dL — AB (ref 2.5–4.6)
POTASSIUM: 4.1 mmol/L (ref 3.5–5.1)
Sodium: 139 mmol/L (ref 135–145)

## 2016-01-20 LAB — CBC
HCT: 32.2 % — ABNORMAL LOW (ref 39.0–52.0)
HEMOGLOBIN: 10.1 g/dL — AB (ref 13.0–17.0)
MCH: 27.4 pg (ref 26.0–34.0)
MCHC: 31.4 g/dL (ref 30.0–36.0)
MCV: 87.5 fL (ref 78.0–100.0)
PLATELETS: 326 10*3/uL (ref 150–400)
RBC: 3.68 MIL/uL — AB (ref 4.22–5.81)
RDW: 13.9 % (ref 11.5–15.5)
WBC: 7.6 10*3/uL (ref 4.0–10.5)

## 2016-01-20 MED ORDER — PREGABALIN 25 MG PO CAPS
25.0000 mg | ORAL_CAPSULE | Freq: Every day | ORAL | Status: DC
  Administered 2016-01-20: 25 mg via ORAL
  Filled 2016-01-20: qty 1

## 2016-01-20 NOTE — Progress Notes (Signed)
[facility] Progress Note    Assessment/ Plan:   55 year old white male with hypertension and chronic pain who presents with rhabdo after his fall and immobility in the setting of narcotics and Klonopin  1. 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  - UOP very good and for 1st time Creatinine may be stabilizing; unable to determine as Cr would be expected to be decreased today after dialysis yesterday (no UF). - True EDW may be in the 76-82kg range (based on extrapolations of the weight lost after the b/l AKA's).  - Seen on HD Sat @1050am   - He may finally be turning the corner --> continue holding the lasix (d/c on 6/29) --> he may be entering the recovery phase of ATN.  * HE SHOULD BE CONSIDERED ACUTE RENAL FAILURE WHEN HE IS DISCHARGED FOR OUTPATIENT DIALYSIS IF HE STILL REQUIRES RENAL REPLACEMENT THERAPY. * * [facility] to watch carefully for renal recovery if necessary but I think he may be turning the corner.  - I requested labs for tomorrow and will hold dialysis.  2. Hypertension/volume  3. Anemia tx 2 units of PRBCs (6/29)  4. S/P Bilat AKAs, for Rehab 5 Scrotal swelling, due to severe 3rd spacing of fluid. Scrotal elevation, dialysis/diuresis  Subjective:   He thinks the scrotal edema is a little better.  Denies f/c/n/v. Good appetite.  Denies dyspnea.   Objective:   BP 118/60 mmHg  Pulse 63  Temp(Src) 98.1 F (36.7 C) (Oral)  Resp 18  Ht 6\' 1"  (1.854 m)  Wt 77.3 kg (170 lb 6.7 oz)  BMI 22.49 kg/m2  SpO2 94%  Intake/Output Summary (Last 24 hours) at 01/20/16 0815 Last data filed at 01/20/16 [id number]  Gross per 24 hour  Intake    720 ml  Output   2826 ml  Net  -2106 ml   Weight change:   Physical Exam: General appearance: alert and cooperative  Chest wall: no tenderness  Neck: RIJ tunneled cath Male genitalia: marked swelling   Extremities: B/L AKA's  Imaging: Ir Fluoro Guide Cv Line Right  01/18/2016  CLINICAL DATA:  Acute kidney injury, needs access for hemodialysis EXAM: TUNNELED HEMODIALYSIS CATHETER PLACEMENT WITH ULTRASOUND AND FLUOROSCOPIC GUIDANCE TECHNIQUE: The procedure, risks, benefits, and alternatives were explained to the patient. Questions regarding the procedure were encouraged and answered. The patient understands and consents to the procedure. As antibiotic prophylaxis, cefazolin 2 g was ordered pre-procedure and administered intravenously within one hour of incision.Patency of the right IJ vein was confirmed with ultrasound with image documentation. An appropriate skin site was determined. Region was prepped using maximum barrier technique including cap and mask, sterile gown, sterile gloves, large sterile sheet, and Chlorhexidine as cutaneous antisepsis. The region was infiltrated locally with 1% lidocaine. Intravenous Fentanyl and Versed were administered as conscious sedation during continuous monitoring of the patient's level of consciousness and physiological / cardiorespiratory status by the radiology RN, with a total moderate sedation time of 15 minutes. Under real-time ultrasound guidance, the right IJ vein was accessed with a 21 gauge micropuncture needle; the needle tip within the vein was confirmed with ultrasound image documentation. Needle exchanged over the 018 guidewire for transitional dilator, which allowed advancement of a Benson wire into the IVC. Over this, an MPA catheter was advanced. A Palindrome 23 hemodialysis catheter was tunneled from the right anterior chest wall approach to the right IJ dermatotomy site. The MPA catheter  was exchanged over an Amplatz wire for serial vascular dilators which allow placement of a peel-away sheath, through which the catheter was advanced under intermittent fluoroscopy, positioned with its tips in the proximal and midright atrium. Spot chest radiograph  confirms good catheter position. No pneumothorax. Catheter was flushed and primed per protocol. Catheter secured externally with O Prolene sutures. The right IJ dermatotomy site was closed with Dermabond. COMPLICATIONS: COMPLICATIONS None immediate FLUOROSCOPY TIME:  18 seconds, 2 mGy COMPARISON:  None IMPRESSION: 1. Technically successful placement of tunneled right IJ hemodialysis catheter with ultrasound and fluoroscopic guidance. Ready for routine use. ACCESS: Remains approachable for percutaneous intervention as needed. Electronically Signed   By: Corlis Leak  Hassell M.D.   On: 01/18/2016 14:36   Ir [place] Guide Vasc Access Right  01/18/2016  CLINICAL DATA:  Acute kidney injury, needs access for hemodialysis EXAM: TUNNELED HEMODIALYSIS CATHETER PLACEMENT WITH ULTRASOUND AND FLUOROSCOPIC GUIDANCE TECHNIQUE: The procedure, risks, benefits, and alternatives were explained to the patient. Questions regarding the procedure were encouraged and answered. The patient understands and consents to the procedure. As antibiotic prophylaxis, cefazolin 2 g was ordered pre-procedure and administered intravenously within one hour of incision.Patency of the right IJ vein was confirmed with ultrasound with image documentation. An appropriate skin site was determined. Region was prepped using maximum barrier technique including cap and mask, sterile gown, sterile gloves, large sterile sheet, and Chlorhexidine as cutaneous antisepsis. The region was infiltrated locally with 1% lidocaine. Intravenous Fentanyl and Versed were administered as conscious sedation during continuous monitoring of the patient's level of consciousness and physiological / cardiorespiratory status by the radiology RN, with a total moderate sedation time of 15 minutes. Under real-time ultrasound guidance, the right IJ vein was accessed with a 21 gauge micropuncture needle; the needle tip within the vein was confirmed with ultrasound image documentation. Needle exchanged  over the 018 guidewire for transitional dilator, which allowed advancement of a Benson wire into the IVC. Over this, an MPA catheter was advanced. A Palindrome 23 hemodialysis catheter was tunneled from the right anterior chest wall approach to the right IJ dermatotomy site. The MPA catheter was exchanged over an Amplatz wire for serial vascular dilators which allow placement of a peel-away sheath, through which the catheter was advanced under intermittent fluoroscopy, positioned with its tips in the proximal and midright atrium. Spot chest radiograph confirms good catheter position. No pneumothorax. Catheter was flushed and primed per protocol. Catheter secured externally with O Prolene sutures. The right IJ dermatotomy site was closed with Dermabond. COMPLICATIONS: COMPLICATIONS None immediate FLUOROSCOPY TIME:  18 seconds, 2 mGy COMPARISON:  None IMPRESSION: 1. Technically successful placement of tunneled right IJ hemodialysis catheter with ultrasound and fluoroscopic guidance. Ready for routine use. ACCESS: Remains approachable for percutaneous intervention as needed. Electronically Signed   By: Corlis Leak  Hassell M.D.   On: 01/18/2016 14:36    Labs: BMET  Recent Labs Lab 01/14/16 0400 01/15/16 0608 01/16/16 0430 01/17/16 0436 01/18/16 0530 01/19/16 0527 01/20/16 0627  NA 133* 132* 136 136 137 139 139  K 4.5 4.8 4.0 3.8 4.0 3.9 4.1  CL 101 92* 99* 97* 94* 94* 98*  CO2 24 27 27 29 30 30 29   GLUCOSE 99 93 107* 102* 97 106* 101*  BUN 41* 58* 37* 45* 55* 62* 37*  CREATININE 6.45* 8.39* 6.05* 6.73* 7.09* 6.63* 3.80*  CALCIUM 6.9* 8.1* 7.7* 8.0* 8.5* 8.9 8.9  PHOS 5.4* 6.7*  --   --  8.0*  --  5.5*  CBC  Recent Labs Lab 01/14/16 1550 01/15/16 0608 01/16/16 0430  01/17/16 0436 01/18/16 0530 01/19/16 0527 01/20/16 0627  WBC 19.1* 12.5* 8.4  --   --   --   --  7.6  NEUTROABS 16.2*  --   --   --   --   --   --   --   HGB 7.3* 7.5* 6.7*  < > 6.7* 8.9* 9.4* 10.1*  HCT 22.0* 22.3* 20.7*  < >  20.6* 27.7* 29.2* 32.2*  MCV 85.3 84.8 84.8  --   --   --   --  87.5  PLT 315 342 345  --   --   --   --  326  < > = values in this interval not displayed.  Medications:    . sodium chloride   Intravenous Once  . sodium chloride   Intravenous Once  . sodium chloride   Intravenous Once  . sodium chloride   Intravenous Once  . amitriptyline  50 mg Oral QHS  . citalopram  40 mg Oral QHS  . feeding supplement (PRO-STAT SUGAR FREE 64)  30 mL Oral BID  . gabapentin  300 mg Oral Daily  . heparin subcutaneous  5,000 Units Subcutaneous Q8H  . pantoprazole  40 mg Oral Daily  . risperiDONE  1 mg Oral QHS  . sodium chloride flush  10-40 mL Intracatheter Q12H      Paulene Floor, MD 01/20/2016, 8:15 AM
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
## 2016-01-21 DIAGNOSIS — M545 Low back pain: Secondary | ICD-10-CM

## 2016-01-21 DIAGNOSIS — R339 Retention of urine, unspecified: Secondary | ICD-10-CM

## 2016-01-21 DIAGNOSIS — I10 Essential (primary) hypertension: Secondary | ICD-10-CM | POA: Insufficient documentation

## 2016-01-21 DIAGNOSIS — R7401 Elevation of levels of liver transaminase levels: Secondary | ICD-10-CM | POA: Insufficient documentation

## 2016-01-21 DIAGNOSIS — D638 Anemia in other chronic diseases classified elsewhere: Secondary | ICD-10-CM

## 2016-01-21 DIAGNOSIS — N179 Acute kidney failure, unspecified: Secondary | ICD-10-CM

## 2016-01-21 DIAGNOSIS — M79A29 Nontraumatic compartment syndrome of unspecified lower extremity: Secondary | ICD-10-CM

## 2016-01-21 DIAGNOSIS — G8929 Other chronic pain: Secondary | ICD-10-CM

## 2016-01-21 DIAGNOSIS — N189 Chronic kidney disease, unspecified: Secondary | ICD-10-CM

## 2016-01-21 DIAGNOSIS — G546 Phantom limb syndrome with pain: Secondary | ICD-10-CM

## 2016-01-21 DIAGNOSIS — J45909 Unspecified asthma, uncomplicated: Secondary | ICD-10-CM

## 2016-01-21 DIAGNOSIS — Z89611 Acquired absence of right leg above knee: Secondary | ICD-10-CM

## 2016-01-21 DIAGNOSIS — E871 Hypo-osmolality and hyponatremia: Secondary | ICD-10-CM | POA: Insufficient documentation

## 2016-01-21 DIAGNOSIS — Z89612 Acquired absence of left leg above knee: Secondary | ICD-10-CM

## 2016-01-21 DIAGNOSIS — R740 Nonspecific elevation of levels of transaminase and lactic acid dehydrogenase [LDH]: Secondary | ICD-10-CM

## 2016-01-21 DIAGNOSIS — D62 Acute posthemorrhagic anemia: Secondary | ICD-10-CM | POA: Insufficient documentation

## 2016-01-21 LAB — BASIC METABOLIC PANEL
ANION GAP: 21 — AB (ref 5–15)
BUN: 55 mg/dL — ABNORMAL HIGH (ref 6–20)
CALCIUM: 9.9 mg/dL (ref 8.9–10.3)
CO2: 28 mmol/L (ref 22–32)
CREATININE: 4.13 mg/dL — AB (ref 0.61–1.24)
Chloride: 92 mmol/L — ABNORMAL LOW (ref 101–111)
GFR, EST AFRICAN AMERICAN: 17 mL/min — AB (ref >60)
GFR, EST NON AFRICAN AMERICAN: 15 mL/min — AB (ref >60)
Glucose, Bld: 113 mg/dL — ABNORMAL HIGH (ref 65–99)
Potassium: 3.7 mmol/L (ref 3.5–5.1)
Sodium: 141 mmol/L (ref 135–145)

## 2016-01-21 LAB — CBC
HCT: 29.7 % — ABNORMAL LOW (ref 39.0–52.0)
HEMOGLOBIN: 9.4 g/dL — AB (ref 13.0–17.0)
MCH: 27.7 pg (ref 26.0–34.0)
MCHC: 31.6 g/dL (ref 30.0–36.0)
MCV: 87.6 fL (ref 78.0–100.0)
PLATELETS: 319 10*3/uL (ref 150–400)
RBC: 3.39 MIL/uL — AB (ref 4.22–5.81)
RDW: 13.8 % (ref 11.5–15.5)
WBC: 8.3 10*3/uL (ref 4.0–10.5)

## 2016-01-21 MED ORDER — SODIUM CHLORIDE 0.9 % IV SOLN
510.0000 mg | Freq: Once | INTRAVENOUS | Status: AC
  Administered 2016-01-21: 510 mg via INTRAVENOUS
  Filled 2016-01-21: qty 17

## 2016-01-21 MED ORDER — PREGABALIN 50 MG PO CAPS
50.0000 mg | ORAL_CAPSULE | Freq: Every day | ORAL | Status: DC
  Administered 2016-01-21 – 2016-01-23 (×3): 50 mg via ORAL
  Filled 2016-01-21 (×3): qty 1

## 2016-01-21 NOTE — Progress Notes (Signed)
Physical Therapy Treatment Patient Details Name: Jeffery Gross MRN: [id number] DOB: 12-19-1960 Today's Date: 01/21/2016    History of Present Illness Jeffery Gross is a 55 y.o. 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 PMH is significant for chronic back pain, back surgery, HTN, depression and anxiety.     PT Comments    Continuing progress with functional mobility and activity tolerance; Updated goals, see Care Plan;   Working towards prone-lying and prone hip extension;   Continue to recommend comprehensive inpatient rehab (CIR) for post-acute therapy needs.   Follow Up Recommendations  CIR     Equipment Recommendations  Wheelchair (measurements PT);Wheelchair cushion (measurements PT);Other (comment)    Recommendations for Other Services       Precautions / Restrictions Precautions Precautions: Fall Restrictions RLE Weight Bearing: Non weight bearing LLE Weight Bearing: Non weight bearing    Mobility  Bed Mobility Overal bed mobility: Needs Assistance Bed Mobility: Rolling;Supine to Sit Rolling: Modified independent (Device/Increase time)   Supine to sit: Supervision     General bed mobility comments: Used bil rails to pull self to long sit in the bed;  Used bed rails for stability; Noted good lateral lean R and L, worked on rotating 180 degrees to L and R in prep for  transfer set up. Worked on rolling all the way to prone (goal of getting prone and working towards prone hip extension) -- pain limited abilti to get prone  Transfers Overall transfer level: Needs assistance   Transfers: Lateral/Scoot Transfers          Lateral/Scoot Transfers: Min assist General transfer comment: Min assist to steady wheelchair; noted good press up and over, less shear  Ambulation/Gait                  Hotel manager mobility: Yes Wheelchair propulsion: Both upper extremities Wheelchair parts: Needs assistance Distance: 50 Wheelchair Assistance Details (indicate cue type and reason): Cues for technqiue  Modified Rankin (Stroke Patients Only)       Balance     Sitting balance-Leahy Scale: Good                              Cognition Arousal/Alertness: Awake/alert Behavior During Therapy: WFL for tasks assessed/performed Overall Cognitive Status: Within Functional Limits for tasks assessed                      Exercises Other Exercises Other Exercises: Chair pushups x10 with good air Other Exercises: Sidelying hip extension R and LLEs Other Exercises: Modified bridging with residual limbs on bolsters    General Comments General comments (skin integrity, edema, etc.): Reports he performs desensitization stimulation daily; Educated on importance of keeping hips stretched out to avoid hip flexors tightness for goal of prosthesis use -- best way to do that is laying prone      Pertinent Vitals/Pain Pain Assessment: 0-10 Pain Score: 7  Pain Location: Bil LEs, phantom pain, spasm Pain Descriptors / Indicators: Aching;Spasm;Grimacing;Guarding Pain Intervention(s): Monitored during session;Premedicated before session    Home Living  Prior Function            PT Goals (current goals can now be found in the care plan section) Acute Rehab PT Goals Patient Stated Goal: get better PT Goal Formulation: With patient Time For Goal Achievement: 02/04/16 Potential to Achieve Goals: Good Additional Goals Additional Goal #1: Pt will manage wheelchair and independently propel wheelchair greater than 600 feet Progress towards PT goals: Goals met and updated - see care plan    Frequency  Min 3X/week    PT Plan Current plan remains appropriate     Co-evaluation             End of Session   Activity Tolerance: Patient tolerated treatment well Patient left: Other (comment) (Rolling along hallways with friend, Waunita Schooner)     Time: [id number] PT Time Calculation (min) (ACUTE ONLY): 31 min  Charges:  $Therapeutic Exercise: 8-22 mins $Therapeutic Activity: 8-22 mins                    G Codes:      Quin Hoop 01/21/2016, 4:25 PM  Roney Marion, [place] Pager [phone] Office [phone]

## 2016-01-21 NOTE — Progress Notes (Addendum)
[facility] Progress Note     Subjective:    No new complaints Making a LOT of urine! Foley has been removed Reports able to void Advised we need to measure everything Wound VACS off   Objective:   BP 115/69 mmHg  Pulse 85  Temp(Src) 98.2 F (36.8 C) (Oral)  Resp 18  Ht 6\' 1"  (1.854 m)  Wt 77.3 kg (170 lb 6.7 oz)  BMI 22.49 kg/m2  SpO2 96%  Intake/Output Summary (Last 24 hours) at 01/21/16 1534 Last data filed at 01/21/16 1301  Gross per 24 hour  Intake   1580 ml  Output   6275 ml  Net  -4695 ml   Weight change: 1.3 kg (2 lb 13.9 oz)  Physical Exam: Young, not acutely ill appearing, pleasant TDC (6/30) R IJ w/dry dressing Lungs clear S1S2 No S3 Abd nontender Bilateral AKA's - wound VACS have been removed. Dressings in place Some scrotal edema   Labs: BMET  Recent Labs Lab 01/15/16 0608 01/16/16 0430 01/17/16 0436 01/18/16 0530 01/19/16 0527 01/20/16 0627 01/21/16 0323  NA 132* 136 136 137 139 139 141  K 4.8 4.0 3.8 4.0 3.9 4.1 3.7  CL 92* 99* 97* 94* 94* 98* 92*  CO2 27 27 29 30 30 29 28   GLUCOSE 93 107* 102* 97 106* 101* 113*  BUN 58* 37* 45* 55* 62* 37* 55*  CREATININE 8.39* 6.05* 6.73* 7.09* 6.63* 3.80* 4.13*  CALCIUM 8.1* 7.7* 8.0* 8.5* 8.9 8.9 9.9  PHOS 6.7*  --   --  8.0*  --  5.5*  --    CBC  Recent Labs Lab 01/14/16 1550 01/15/16 0608 01/16/16 0430  01/18/16 0530 01/19/16 0527 01/20/16 0627 01/21/16 0323  WBC 19.1* 12.5* 8.4  --   --   --  7.6 8.3  NEUTROABS 16.2*  --   --   --   --   --   --   --   HGB 7.3* 7.5* 6.7*  < > 8.9* 9.4* 10.1* 9.4*  HCT 22.0* 22.3* 20.7*  < > 27.7* 29.2* 32.2* 29.7*  MCV 85.3 84.8 84.8  --   --   --  87.5 87.6  PLT 315 342 345  --   --   --  326 319  < > = values in this interval not displayed.  Medications:    . sodium chloride   Intravenous Once  . sodium chloride   Intravenous Once  . sodium chloride   Intravenous Once  . sodium chloride   Intravenous Once  .  amitriptyline  50 mg Oral QHS  . citalopram  40 mg Oral QHS  . feeding supplement (PRO-STAT SUGAR FREE 64)  30 mL Oral BID  . heparin subcutaneous  5,000 Units Subcutaneous Q8H  . pantoprazole  40 mg Oral Daily  . pregabalin  50 mg Oral Daily  . risperiDONE  1 mg Oral QHS  . sodium chloride flush  10-40 mL Intracatheter Q12H   Background 55 year old white male with hypertension and chronic pain who presented with rhabdo after  fall and immobility in the setting of narcotics and Klonopin. Required bilateral AKA's. Required initiation of dialysis 01/07/16. TDC 6/30.     Assessment/ Plan:    1. AKIdue to ATN due to rhabdomyolysis. 1st HD 01/07/16. Last HD 7/1. Creatinine up some past 24 hours but hard to determine significance since had HD 7/1. Tomorrow's lab will be better indicator of possible improving function.  Robust UOP.(>5 liters/24 hours)  No diuretics since 6/29.  Have held off on HD today. Check labs in the AM and decide on further HD at that time. ([facility] - if he does not qualify for inpt rehab and still needs HD but hopeful that he is in recovery phase) 2. S/p bilateral AKA's d/t compartment syndrome from severe rhabdo. Wound VACS in place. Dr. Lajoyce Cornersuda following. Hoping for inpatient rehab.  3. Anemia - 8U transfused this admission. Not on aranesp. TSat of 3% 6/26 and 1 dose Feraheme on 6/28, will need 2nd dose.  4. HTN - BP meds on hold - pressures good 5. Anx/dep - per primary service 6. Chronic pain disorder - per primary svce  Camille Balynthia Binyamin Nelis, MD [facility] [phone] Pager 01/21/2016, 3:46 PM

## 2016-01-21 NOTE — Progress Notes (Signed)
Rehab admissions - Please see rehab consult done today by Dr. Allena KatzPatel.  I will see patient in the am.  Call me for questions.  #[phone]
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
## 2016-01-21 NOTE — Progress Notes (Signed)
Patient ID: Jeffery Gross, male   DOB: November 17, 1960, 55 y.o.   MRN: [id number] Patient's wound vacs are functioning well. There is no drainage in either VAC over the past several days. Patient anticipate transfer to inpatient rehabilitation. We will have the wound vacs removed for both lower extremities today to facilitate his transfer training. Begin dry dressing changes daily.

## 2016-01-21 NOTE — Consult Note (Signed)
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 right handed male with history of chronic back pain, hypertension, asthma. Independent prior to admission living alone. Patient has an ex-wife who could assist on discharge but she works during the day. Presented 01/03/2016 with bilateral lower extremity pain 2 days sustained in a fall with questionable brief loss of consciousness. Patient stated he hit the right side of his jaw on the ground. Cranial CT scan as well as CT maxillofacial findings negative for acute changes. CT of abdomen and pelvis negative. Troponin negative. Lower extremity Doppler showed DVT in the right popliteal, PT and peroneal veins with intravenous heparin initiated. Findings of elevated creatinine 5.40-6.44 as well as hyponatremia 122-125. Renal ultrasound normal without hydronephrosis. Nephrology consulted for a KI with rhabdo. Dialysis was later initiated with slow return of renal function. Echocardiogram with ejection fraction of 60% no PFO no wall motion abnormalities. Orthopedic services consulted 01/09/2016 for muscle necrosis right lower extremity suspect bilateral lower leg compartment syndrome from rhabdo. Underwent bilateral lower leg 4 compartment fasciotomy bilateral lower leg debridement of nonviable muscle application of wound VAC 01/03/2016 per Dr.Xu as well as irrigation and debridement 01/05/2016.Marland Kitchen. 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 Lajoyce Cornersuda. Hospital course pain management. Intravenous heparin has been substituted with subcutaneous heparin. Acute on chronic anemia 9.1 and monitored. Physical and occupational therapy evaluations completed with recommendations of physical medicine rehabilitation  consult.   Review of Systems  Constitutional: Negative for fever and chills.  HENT: Negative for hearing loss.   Eyes: Negative for blurred vision and double vision.  Respiratory: Negative for cough and shortness of breath.   Cardiovascular: Positive for leg swelling. Negative for chest pain and palpitations.  Gastrointestinal: Positive for constipation. Negative for nausea and vomiting.  Genitourinary: Negative for dysuria and hematuria.  Musculoskeletal: Positive for back pain.       Lower extremity pain 2 weeks  Skin: Negative for rash.  Neurological: Negative for seizures, weakness and headaches.       Phantom limb pain  Psychiatric/Behavioral: Positive for depression.  All other systems reviewed and are negative.  Past Medical History  Diagnosis Date  . Chest pain   . DDD (degenerative disc disease), cervical   . Back pain   . HTN (hypertension)   . Depression    Past Surgical History  Procedure Laterality Date  . Vasectomy    . Fasciotomy Bilateral 01/03/2016    Procedure: FASCIOTOMY;  Surgeon: Tarry KosNaiping M Xu, MD;  Location: [facility] OR;  Service: Orthopedics;  Laterality: Bilateral;  . I&d extremity Bilateral 01/05/2016    Procedure: IRRIGATION AND DEBRIDEMENT BILATERAL LOWER EXTREMITIES; WOUND VAC CHANGE;  Surgeon: Tarry KosNaiping M Xu, MD;  Location: [facility] OR;  Service: Orthopedics;  Laterality: Bilateral;  . I&d extremity Left 01/07/2016    Procedure: Irrigation and Debridement of Left Lower Extremity with Application of Wound Vac;  Surgeon: Tarry KosNaiping M Xu, MD;  Location: [facility] OR;  Service: Orthopedics;  Laterality: Left;  . Amputation Bilateral 01/09/2016    Procedure: Bilateral Above Knee Amputation, Apply Wound VAC;  Surgeon: Nadara MustardMarcus V Duda, MD;  Location: [facility] OR;  Service: Orthopedics;  Laterality: Bilateral;   Family History  Problem Relation Age of Onset  . Hypertension Father    Social History:  reports  that he has been smoking Cigars.  He does not have any smokeless tobacco history  on file. He reports that he drinks alcohol. His drug history is not on file. Allergies:  Allergies  Allergen Reactions  . Sulfacetamide Sodium Other (See Comments)    edema   Medications Prior to Admission  Medication Sig Dispense Refill  . ALPRAZolam (XANAX) 0.5 MG tablet Take 0.5 mg by mouth at bedtime as needed for sleep.    Marland Kitchen amitriptyline (ELAVIL) 50 MG tablet Take 50 mg by mouth at bedtime.    Marland Kitchen amLODipine (NORVASC) 10 MG tablet Take 10 mg by mouth daily.    . cetirizine (ZYRTEC) 10 MG tablet Take 10 mg by mouth daily.    . citalopram (CELEXA) 40 MG tablet Take 40 mg by mouth daily.    . clonazePAM (KLONOPIN) 0.5 MG tablet Take 0.5 mg by mouth 2 (two) times daily as needed for anxiety.    . cyclobenzaprine (FLEXERIL) 10 MG tablet Take 10 mg by mouth every 8 (eight) hours as needed. Muscle spasms  0  . hydrochlorothiazide (HYDRODIURIL) 25 MG tablet Take 25 mg by mouth daily.    . montelukast (SINGULAIR) 10 MG tablet Take 10 mg by mouth daily as needed. allergies  11  . oxyCODONE (ROXICODONE) 15 MG immediate release tablet Take 15 mg by mouth every 8 (eight) hours as needed. pain  0  . pantoprazole (PROTONIX) 40 MG tablet Take 40 mg by mouth daily.  11  . risperiDONE (RISPERDAL) 1 MG tablet Take 1 mg by mouth at bedtime.  5    Home: Home Living Family/patient expects to be discharged to:: Unsure Living Arrangements: Non-relatives/Friends Additional Comments: Patient unsure where he will go; one home has 17 steps to enter  Functional History: Prior Function Level of Independence: Independent Comments: Higher education careers adviser Status:  Mobility: Bed Mobility Overal bed mobility: Needs Assistance Bed Mobility: Supine to Sit, Rolling, Sit to Supine Rolling: Modified independent (Device/Increase time) Sidelying to sit: Supervision Supine to sit: Supervision Sit to supine: Supervision General bed mobility comments: Used bil rails to pull self to long sit in the bed;  Used  bed rails for stability; Noted good lateral lean R and L, worked on rotating 180 degrees to L and R in prep for  transfer set up. Prrcticed weight shifting without UE' support. Performed Sitting balance activities without UE support. Balances well and weight shifts without UE support. Transfers Overall transfer level: Needs assistance Equipment used:  (slide board, drop arm BSC) Transfers: Lateral/Scoot Transfers Anterior-Posterior transfers: +2 safety/equipment, Min assist, +2 physical assistance, Max assist  Lateral/Scoot Transfers: Mod assist, +2 safety/equipment General transfer comment: pt did very well with lateral scoot transfer from bed - BSC - recliner usinf slide board and was able to use B UEs to raise buttocks for therapists to adjust seating and pad underneath pt      ADL: ADL Overall ADL's : Needs assistance/impaired Eating/Feeding: Set up Grooming: Wash/dry hands, Wash/dry face, Set up, Sitting Upper Body Bathing: Minimal assitance, Sitting Lower Body Bathing: Total assistance, +2 for physical assistance, Bed level Upper Body Dressing : Minimal assistance, Sitting Lower Body Dressing: +2 for physical assistance, Total assistance, Bed level  Cognition: Cognition Overall Cognitive Status: Within Functional Limits for tasks assessed Orientation Level: Oriented X4 Cognition Arousal/Alertness: Awake/alert Behavior During Therapy: WFL for tasks assessed/performed Overall Cognitive Status: Within Functional Limits for tasks assessed  Blood pressure 109/65, pulse 85, temperature 98 F (36.7 C), temperature source Oral, resp.  rate 18, height 6\' 1"  (1.854 m), weight 77.3 kg (170 lb 6.7 oz), SpO2 95 %. Physical Exam  Vitals reviewed. Constitutional: He is oriented to person, place, and time. He appears well-developed and well-nourished.  HENT:  Head: Normocephalic and atraumatic.  Eyes: Conjunctivae and EOM are normal.  Neck: Normal range of motion. Neck supple. No  thyromegaly present.  Cardiovascular: Normal rate and regular rhythm.   Respiratory: Effort normal and breath sounds normal. No respiratory distress.  GI: Soft. Bowel sounds are normal. He exhibits no distension.  Musculoskeletal: He exhibits edema and tenderness (L>RLE).  Neurological: He is alert and oriented to person, place, and time.  Motor: B/l UE 5/5 proximal to distal B/l LE: hip flexion 5/5 proximal Sensation intact to light touch  Skin:  Bilateral AKA sites with VACs  Psychiatric: He has a normal mood and affect. His behavior is normal.    Results for orders placed or performed during the hospital encounter of 01/03/16 (from the past 24 hour(s))  CBC     Status: Abnormal   Collection Time: 01/20/16  6:27 AM  Result Value Ref Range   WBC 7.6 4.0 - 10.5 K/uL   RBC 3.68 (L) 4.22 - 5.81 MIL/uL   Hemoglobin 10.1 (L) 13.0 - 17.0 g/dL   HCT 16.1 (L) 09.6 - 04.5 %   MCV 87.5 78.0 - 100.0 fL   MCH 27.4 26.0 - 34.0 pg   MCHC 31.4 30.0 - 36.0 g/dL   RDW 40.9 81.1 - 91.4 %   Platelets 326 150 - 400 K/uL  Renal function panel     Status: Abnormal   Collection Time: 01/20/16  6:27 AM  Result Value Ref Range   Sodium 139 135 - 145 mmol/L   Potassium 4.1 3.5 - 5.1 mmol/L   Chloride 98 (L) 101 - 111 mmol/L   CO2 29 22 - 32 mmol/L   Glucose, Bld 101 (H) 65 - 99 mg/dL   BUN 37 (H) 6 - 20 mg/dL   Creatinine, Ser 7.82 (H) 0.61 - 1.24 mg/dL   Calcium 8.9 8.9 - 95.6 mg/dL   Phosphorus 5.5 (H) 2.5 - 4.6 mg/dL   Albumin 2.1 (L) 3.5 - 5.0 g/dL   GFR calc non Af Amer 17 (L) >60 mL/min   GFR calc Af Amer 19 (L) >60 mL/min   Anion gap 12 5 - 15  Basic metabolic panel     Status: Abnormal   Collection Time: 01/21/16  3:23 AM  Result Value Ref Range   Sodium 141 135 - 145 mmol/L   Potassium 3.7 3.5 - 5.1 mmol/L   Chloride 92 (L) 101 - 111 mmol/L   CO2 28 22 - 32 mmol/L   Glucose, Bld 113 (H) 65 - 99 mg/dL   BUN 55 (H) 6 - 20 mg/dL   Creatinine, Ser 2.13 (H) 0.61 - 1.24 mg/dL   Calcium  9.9 8.9 - 08.6 mg/dL   GFR calc non Af Amer 15 (L) >60 mL/min   GFR calc Af Amer 17 (L) >60 mL/min   Anion gap 21 (H) 5 - 15  CBC     Status: Abnormal   Collection Time: 01/21/16  3:23 AM  Result Value Ref Range   WBC 8.3 4.0 - 10.5 K/uL   RBC 3.39 (L) 4.22 - 5.81 MIL/uL   Hemoglobin 9.4 (L) 13.0 - 17.0 g/dL   HCT 57.8 (L) 46.9 - 62.9 %   MCV 87.6 78.0 - 100.0 fL   MCH 27.7  26.0 - 34.0 pg   MCHC 31.6 30.0 - 36.0 g/dL   RDW 78.213.8 95.611.5 - 21.315.5 %   Platelets 319 150 - 400 K/uL   No results found.  Assessment/Plan: Diagnosis: Bilateral AKA Labs and images independently reviewed.  Records reviewed and summated above. Clean amputation daily with soap and water Monitor incision site for signs of infection or impending skin breakdown. Staples to remain in place for 3-4 weeks Stump shrinker, for edema control  Scar mobilization massaging to prevent soft tissue adherence Stump protector during therapies Prevent flexion contractures by implementing the following:   Encourage prone lying for 20-30 mins per day BID to avoid hip flexion  Contractures if medically appropriate;  Avoid prolonged sitting Post surgical pain control with oral medication Phantom limb pain control with physical modalities including desensitization techniques (gentle self massage to the residual stump,hot packs if sensation iintact, US) and mirror therapy, TENS. If ineffective, consider pharmacological treatment for neuropathic pain (e.g gabapentin, pregabalin, amytriptalyine, duloxetine).   1. Does the need for close, 24 hr/day medical supervision in concert with the patient's rehab needs make it unreasonable for this patient to be served in a less intensive setting? Yes  2. Co-Morbidities requiring supervision/potential complications: chronic back pain (Biofeedback training with therapies to help reduce reliance on opiate pain medications, monitor pain control during therapies, and sedation at rest and titrate to maximum  efficacy to ensure participation and gains in therapies), HTN (monitor and provide prns in accordance with increased physical exertion and pain), asthma (monitor RR and O2 sats with increased activity), hyponatremia (cont to monitor, appears to have stabilized), AKI on CKD (avoid nephrotoxic meds), ABLA on chronic anemia (transfuse if necessary to ensure appropriate perfusion for increased activity tolerance), transaminitis (avoid hepatotoxic meds) 3. Due to bladder management, bowel management, safety, skin/wound care, disease management, medication administration, pain management and patient education, does the patient require 24 hr/day rehab nursing? Yes 4. Does the patient require coordinated care of a physician, rehab nurse, PT (1-2 hrs/day, 5 days/week) and OT (1-2 hrs/day, 5 days/week) to address physical and functional deficits in the context of the above medical diagnosis(es)? Yes Addressing deficits in the following areas: balance, endurance, strength, transferring, bowel/bladder control, bathing, dressing, toileting and psychosocial support 5. Can the patient actively participate in an intensive therapy program of at least 3 hrs of therapy per day at least 5 days per week? Yes 6. The potential for patient to make measurable gains while on inpatient rehab is excellent and good 7. Anticipated functional outcomes upon discharge from inpatient rehab are modified independent  with PT, modified independent with OT, n/a with SLP. 8. Estimated rehab length of stay to reach the above functional goals is: Likely 5-7 days. 9. Does the patient have adequate social supports and living environment to accommodate these discharge functional goals? Yes 10. Anticipated D/C setting: Home 11. Anticipated post D/C treatments: HH therapy and Home excercise program 12. Overall Rehab/Functional Prognosis: good  RECOMMENDATIONS: This patient's condition is appropriate for continued rehabilitative care in the  following setting: Will need a more thorough evaluation by therapies, but pt will likely benefit from a short CIR stay to optimize independence prior to discharge.  Patient has agreed to participate in recommended program. Yes Note that insurance prior authorization may be required for reimbursement for recommended care.  Comment: Rehab Admissions Coordinator to follow up.  Maryla MorrowAnkit Corgan Mormile, MD 01/21/2016

## 2016-01-22 LAB — RENAL FUNCTION PANEL
ANION GAP: 15 (ref 5–15)
Albumin: 2.7 g/dL — ABNORMAL LOW (ref 3.5–5.0)
BUN: 63 mg/dL — ABNORMAL HIGH (ref 6–20)
CALCIUM: 9.3 mg/dL (ref 8.9–10.3)
CHLORIDE: 98 mmol/L — AB (ref 101–111)
CO2: 25 mmol/L (ref 22–32)
Creatinine, Ser: 3.84 mg/dL — ABNORMAL HIGH (ref 0.61–1.24)
GFR calc non Af Amer: 16 mL/min — ABNORMAL LOW (ref >60)
GFR, EST AFRICAN AMERICAN: 19 mL/min — AB (ref >60)
Glucose, Bld: 104 mg/dL — ABNORMAL HIGH (ref 65–99)
Phosphorus: 6.3 mg/dL — ABNORMAL HIGH (ref 2.5–4.6)
Potassium: 3.9 mmol/L (ref 3.5–5.1)
Sodium: 138 mmol/L (ref 135–145)

## 2016-01-22 LAB — CBC
HEMATOCRIT: 32 % — AB (ref 39.0–52.0)
HEMOGLOBIN: 10.2 g/dL — AB (ref 13.0–17.0)
MCH: 27.9 pg (ref 26.0–34.0)
MCHC: 31.9 g/dL (ref 30.0–36.0)
MCV: 87.4 fL (ref 78.0–100.0)
Platelets: 311 10*3/uL (ref 150–400)
RBC: 3.66 MIL/uL — AB (ref 4.22–5.81)
RDW: 13.8 % (ref 11.5–15.5)
WBC: 9 10*3/uL (ref 4.0–10.5)

## 2016-01-22 MED ORDER — OXYCODONE HCL 5 MG PO TABS
10.0000 mg | ORAL_TABLET | Freq: Once | ORAL | Status: AC
  Administered 2016-01-22: 10 mg via ORAL
  Filled 2016-01-22: qty 2

## 2016-01-22 NOTE — Progress Notes (Signed)
Physical Therapy Treatment Patient Details Name: Jeffery Gross MRN: [id number] DOB: 05/17/1961 Today's Date: 01/22/2016    History of Present Illness Jeffery Gross is a 55 y.o. 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 PMH is significant for chronic back pain, back surgery, HTN, depression and anxiety.     PT Comments    Continuing progress with functional mobility, transfers, therex, and activity tolerance; Able to get to prone position today -- continuing education on optimal positioning for eventual goal of bil prostheses;   Overall good wheelchair management; will need a specialty chair with axle modified for Bil AKA center of mass;   Continue to recommend comprehensive inpatient rehab (CIR) for post-acute therapy needs.   Follow Up Recommendations  CIR     Equipment Recommendations  Wheelchair (measurements PT);Wheelchair cushion (measurements PT)    Recommendations for Other Services       Precautions / Restrictions Precautions Precautions: Fall Restrictions RLE Weight Bearing: Non weight bearing LLE Weight Bearing: Non weight bearing    Mobility  Bed Mobility Overal bed mobility: Needs Assistance Bed Mobility: Rolling;Supine to Sit Rolling: Min assist   Supine to sit: Supervision     General bed mobility comments: min assist for clothing management and to lower rail to allow space for RLE as [name] rolled into prone; encouragement given as this was quite painful  Transfers Overall transfer level: Needs assistance   Transfers: Lateral/Scoot Transfers          Lateral/Scoot Transfers: Min assist General transfer comment: Min assist to steady wheelchair; noted good press up and over, less shear  Ambulation/Gait                 Psychologist, counsellingtairs             Wheelchair Mobility Wheelchair Mobility Wheelchair mobility: Yes Wheelchair propulsion: Both upper extremities Wheelchair parts: Needs assistance Distance: 200 Wheelchair Assistance Details (indicate cue type and reason): Cues for technique, and to be aware of center of mass and tendency to tip in this stardard wheelchair; noted 3 instances when casters came up due to some posterior tipping, and [name] was able to feel when this occurred and problem-solve how to prevent tipping backwards; minguard assist for safety, especially going up an incline  Modified Rankin (Stroke Patients Only)       Balance     Sitting balance-Leahy Scale: Good                              Cognition Arousal/Alertness: Awake/alert Behavior During Therapy: WFL for tasks assessed/performed Overall Cognitive Status: Within Functional Limits for tasks assessed                      Exercises General Exercises - Upper Extremity Shoulder Flexion: Strengthening;Both;20 reps;Seated;Theraband Theraband Level (Shoulder Flexion): Level 3 (Green) Shoulder Extension: Strengthening;20 reps;Seated;Theraband Theraband Level (Shoulder Extension): Level 3 (Green) Shoulder Horizontal ABduction: Strengthening;Both;20 reps;Seated;Theraband Theraband Level (Shoulder Horizontal Abduction): Level 3 (Green) Elbow Flexion: Strengthening;Both;20 reps;Seated;Theraband Theraband Level (Elbow Flexion): Level 3 (Green) (doubled) Elbow Extension: Strengthening;Both;20 reps;Seated;Theraband Theraband Level (Elbow Extension): Level 3 (Green) (doubled) Other Exercises Other Exercises: Chair pushups x10 with good air Other Exercises: Prone hip extension, alternating x 10 Other Exercises: Modified bridging with residual limbs on bolsters x10 Other  Exercises: Prone gluteal setting x10    General Comments General comments (skin integrity, edema, etc.): Reports he performs desensitization stimulation daily; Educated  on importance of keeping hips stretched out to avoid hip flexors tightness for goal of prosthesis use -- best way to do that is laying prone      Pertinent Vitals/Pain Pain Assessment: Faces Faces Pain Scale: Hurts even more Pain Location: Bil LEs, L greater than R, with therex Pain Descriptors / Indicators: Aching;Grimacing Pain Intervention(s): Monitored during session;Premedicated before session    Home Living                      Prior Function            PT Goals (current goals can now be found in the care plan section) Acute Rehab PT Goals Patient Stated Goal: get better PT Goal Formulation: With patient Time For Goal Achievement: 02/04/16 Potential to Achieve Goals: Good Progress towards PT goals: Progressing toward goals    Frequency  Min 3X/week    PT Plan Current plan remains appropriate    Co-evaluation             End of Session   Activity Tolerance: Patient tolerated treatment well Patient left: Other (comment) (in wheehchair with friend present)     Time: [id number] PT Time Calculation (min) (ACUTE ONLY): 31 min  Charges:  $Therapeutic Exercise: 8-22 mins $Therapeutic Activity: 8-22 mins                    G Codes:      Olen PelGarrigan, [name] 01/22/2016, 4:52 PM  [name], [place]PT  Acute Rehabilitation Services Pager [phone] Office [phone]

## 2016-01-22 NOTE — Evaluation (Signed)
Occupational Therapy Evaluation Patient Details Name: Jeffery Gross MRN: [id number] DOB: 10-12-1960 Today's Date: 01/22/2016    History of Present Illness Jeffery Gross is a 55 y.o. 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 PMH is significant for chronic back pain, back surgery, HTN, depression and anxiety.    Clinical Impression   Focus of session on UB exercises using level 3 theraband. Pt demonstrated ability to perform independently and instructed pt to perform 3 x per day. Began instruction in compensatory strategies for LB dressing at bed level and EOB. Set up drop arm commode for pt use with nursing for further practice with lateral transfers.    Follow Up Recommendations  CIR    Equipment Recommendations  Wheelchair (measurements OT);Wheelchair cushion (measurements OT);Tub/shower seat;Tub/shower bench (drop arm commode)    Recommendations for Other Services       Precautions / Restrictions Precautions Precautions: Fall      Mobility Bed Mobility               General bed mobility comments: mod I to pull up to long sitting   Transfers                      Balance     Sitting balance-Leahy Scale: Good                                      ADL                                         General ADL Comments: Family is bringing underwear and shorts for pt. Educated pt in LB dressing in sitting on bed leaning side to side or bridging.     Vision     Perception     Praxis      Pertinent Vitals/Pain Pain Assessment: Faces Faces Pain Scale: No hurt     Hand Dominance     Extremity/Trunk Assessment             Communication     Cognition Arousal/Alertness: Awake/alert Behavior During Therapy: WFL for tasks  assessed/performed Overall Cognitive Status: Within Functional Limits for tasks assessed                     General Comments       Exercises Exercises: General Upper Extremity     Shoulder Instructions      Home Living                                          Prior Functioning/Environment               OT Diagnosis:     OT Problem List:     OT Treatment/Interventions:      OT Goals(Current goals can be found in the care plan section) Acute Rehab OT Goals Patient Stated Goal: get better Time For Goal Achievement: 01/24/16 Potential to Achieve Goals: Good  OT Frequency: Min 2X/week   Barriers to D/C:  Co-evaluation              End of Session    Activity Tolerance: Patient tolerated treatment well Patient left: in bed;with call bell/phone within reach;with family/visitor present (friend and PT)   Time: [id number] OT Time Calculation (min): 28 min Charges:  OT General Charges $OT Visit: 1 Procedure OT Treatments $Self Care/Home Management : 23-37 mins G-Codes:    Evern BioMayberry, Edessa Jakubowicz Lynn 01/22/2016, 3:57 PM

## 2016-01-22 NOTE — Progress Notes (Signed)
Subjective: Interval History: has no complaint of eating better, feeling more energy.  Objective: Vital signs in last 24 hours: Temp:  [98 F (36.7 C)-98.4 F (36.9 C)] 98 F (36.7 C) (07/04 [id number]) Pulse Rate:  [73-114] 73 (07/04 0638) Resp:  [18] 18 (07/04 0638) BP: (115-123)/(69-76) 117/70 mmHg (07/04 0638) SpO2:  [96 %-100 %] 100 % (07/04 [id number]) Weight:  [74.3 kg (163 lb 12.8 oz)] 74.3 kg (163 lb 12.8 oz) (07/04 0500) Weight change: -3 kg (-6 lb 9.8 oz)  Intake/Output from previous day: 07/03 0701 - 07/04 0700 In: 1207 [P.O.:1080; I.V.:10; IV Piggyback:117] Out: 4050 [Urine:4050] Intake/Output this shift:    General appearance: alert, cooperative and no distress Resp: clear to auscultation bilaterally Chest wall: R IJ PC Cardio: S1, S2 normal and systolic murmur: holosystolic 2/6, blowing at apex GI: soft, non-tender; bowel sounds normal; no masses,  no organomegaly Extremities: 1+ edema, Bilat AKA with dressings  Lab Results:  Recent Labs  01/21/16 0323 01/22/16 0429  WBC 8.3 9.0  HGB 9.4* 10.2*  HCT 29.7* 32.0*  PLT 319 311   BMET:  Recent Labs  01/21/16 0323 01/22/16 0409  NA 141 138  K 3.7 3.9  CL 92* 98*  CO2 28 25  GLUCOSE 113* 104*  BUN 55* 63*  CREATININE 4.13* 3.84*  CALCIUM 9.9 9.3   No results for input(s): PTH in the last 72 hours. Iron Studies: No results for input(s): IRON, TIBC, TRANSFERRIN, FERRITIN in the last 72 hours.  Studies/Results: No results found.  I have reviewed the patient's current medications.  Assessment/Plan: 1 AKI nonoliguric , diuresing,  Cr improving, acid/base/K ok. 2 Anemia on Fe improving 3 Rhabdo/found down.  Need to minimize narcotics, sedative 4 AKAs per Ortho P follow Chem, vol, get PC out in 1-2 d.    LOS: 19 days   Jeffery Gross L 01/22/2016,9:40 AM
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
## 2016-01-22 NOTE — Progress Notes (Signed)
Rehab admissions - I met with patient.  He would like inpatient rehab stay.  I will fax information to [facility] and then follow up tomorrow.  Call me for questions.  #[phone]

## 2016-01-23 ENCOUNTER — Encounter (HOSPITAL_COMMUNITY): Payer: Self-pay | Admitting: *Deleted

## 2016-01-23 ENCOUNTER — Inpatient Hospital Stay (HOSPITAL_COMMUNITY)
Admission: RE | Admit: 2016-01-23 | Discharge: 2016-01-30 | DRG: 560 | Disposition: A | Discharge status: Home Health | Payer: BLUE CROSS/BLUE SHIELD | Source: Intra-hospital | Attending: Physical Medicine & Rehabilitation | Admitting: Physical Medicine & Rehabilitation

## 2016-01-23 DIAGNOSIS — F17290 Nicotine dependence, other tobacco product, uncomplicated: Secondary | ICD-10-CM | POA: Diagnosis present

## 2016-01-23 DIAGNOSIS — Z4781 Encounter for orthopedic aftercare following surgical amputation: Secondary | ICD-10-CM | POA: Diagnosis present

## 2016-01-23 DIAGNOSIS — Z882 Allergy status to sulfonamides status: Secondary | ICD-10-CM | POA: Diagnosis not present

## 2016-01-23 DIAGNOSIS — T796XXS Traumatic ischemia of muscle, sequela: Secondary | ICD-10-CM | POA: Diagnosis not present

## 2016-01-23 DIAGNOSIS — E8809 Other disorders of plasma-protein metabolism, not elsewhere classified: Secondary | ICD-10-CM | POA: Insufficient documentation

## 2016-01-23 DIAGNOSIS — R300 Dysuria: Secondary | ICD-10-CM | POA: Diagnosis present

## 2016-01-23 DIAGNOSIS — F418 Other specified anxiety disorders: Secondary | ICD-10-CM | POA: Diagnosis present

## 2016-01-23 DIAGNOSIS — Z89612 Acquired absence of left leg above knee: Secondary | ICD-10-CM

## 2016-01-23 DIAGNOSIS — G629 Polyneuropathy, unspecified: Secondary | ICD-10-CM | POA: Diagnosis present

## 2016-01-23 DIAGNOSIS — G546 Phantom limb syndrome with pain: Secondary | ICD-10-CM | POA: Diagnosis present

## 2016-01-23 DIAGNOSIS — M6282 Rhabdomyolysis: Secondary | ICD-10-CM | POA: Diagnosis present

## 2016-01-23 DIAGNOSIS — D62 Acute posthemorrhagic anemia: Secondary | ICD-10-CM | POA: Diagnosis present

## 2016-01-23 DIAGNOSIS — N179 Acute kidney failure, unspecified: Secondary | ICD-10-CM

## 2016-01-23 DIAGNOSIS — Z79899 Other long term (current) drug therapy: Secondary | ICD-10-CM | POA: Diagnosis not present

## 2016-01-23 DIAGNOSIS — G8918 Other acute postprocedural pain: Secondary | ICD-10-CM

## 2016-01-23 DIAGNOSIS — I10 Essential (primary) hypertension: Secondary | ICD-10-CM | POA: Diagnosis present

## 2016-01-23 DIAGNOSIS — Z89611 Acquired absence of right leg above knee: Secondary | ICD-10-CM | POA: Diagnosis not present

## 2016-01-23 DIAGNOSIS — M79A21 Nontraumatic compartment syndrome of right lower extremity: Secondary | ICD-10-CM

## 2016-01-23 DIAGNOSIS — Z720 Tobacco use: Secondary | ICD-10-CM | POA: Insufficient documentation

## 2016-01-23 DIAGNOSIS — I998 Other disorder of circulatory system: Secondary | ICD-10-CM

## 2016-01-23 DIAGNOSIS — Z8249 Family history of ischemic heart disease and other diseases of the circulatory system: Secondary | ICD-10-CM | POA: Diagnosis not present

## 2016-01-23 DIAGNOSIS — R740 Nonspecific elevation of levels of transaminase and lactic acid dehydrogenase [LDH]: Secondary | ICD-10-CM | POA: Diagnosis present

## 2016-01-23 DIAGNOSIS — E46 Unspecified protein-calorie malnutrition: Secondary | ICD-10-CM | POA: Insufficient documentation

## 2016-01-23 DIAGNOSIS — K5903 Drug induced constipation: Secondary | ICD-10-CM | POA: Insufficient documentation

## 2016-01-23 DIAGNOSIS — K5900 Constipation, unspecified: Secondary | ICD-10-CM | POA: Diagnosis present

## 2016-01-23 LAB — RENAL FUNCTION PANEL
ALBUMIN: 2.7 g/dL — AB (ref 3.5–5.0)
Anion gap: 13 (ref 5–15)
BUN: 68 mg/dL — ABNORMAL HIGH (ref 6–20)
CHLORIDE: 99 mmol/L — AB (ref 101–111)
CO2: 27 mmol/L (ref 22–32)
CREATININE: 3.52 mg/dL — AB (ref 0.61–1.24)
Calcium: 9.5 mg/dL (ref 8.9–10.3)
GFR, EST AFRICAN AMERICAN: 21 mL/min — AB (ref >60)
GFR, EST NON AFRICAN AMERICAN: 18 mL/min — AB (ref >60)
Glucose, Bld: 103 mg/dL — ABNORMAL HIGH (ref 65–99)
PHOSPHORUS: 5.7 mg/dL — AB (ref 2.5–4.6)
Potassium: 3.9 mmol/L (ref 3.5–5.1)
Sodium: 139 mmol/L (ref 135–145)

## 2016-01-23 MED ORDER — ENOXAPARIN SODIUM 30 MG/0.3ML ~~LOC~~ SOLN
30.0000 mg | SUBCUTANEOUS | Status: DC
  Administered 2016-01-23 – 2016-01-29 (×7): 30 mg via SUBCUTANEOUS
  Filled 2016-01-23 (×7): qty 0.3

## 2016-01-23 MED ORDER — PROCHLORPERAZINE EDISYLATE 5 MG/ML IJ SOLN: 5.0000 mg | Freq: Four times a day (QID) | INTRAMUSCULAR | Status: DC | PRN

## 2016-01-23 MED ORDER — PRO-STAT SUGAR FREE PO LIQD
30.0000 mL | Freq: Two times a day (BID) | ORAL | Status: DC
  Administered 2016-01-23 – 2016-01-29 (×9): 30 mL via ORAL
  Filled 2016-01-23 (×11): qty 30

## 2016-01-23 MED ORDER — METHOCARBAMOL 500 MG PO TABS
500.0000 mg | ORAL_TABLET | Freq: Four times a day (QID) | ORAL | Status: DC | PRN
  Administered 2016-01-23 – 2016-01-30 (×18): 500 mg via ORAL
  Filled 2016-01-23 (×18): qty 1

## 2016-01-23 MED ORDER — ALUM & MAG HYDROXIDE-SIMETH 200-200-20 MG/5ML PO SUSP: 30.0000 mL | ORAL | Status: DC | PRN

## 2016-01-23 MED ORDER — PROCHLORPERAZINE 25 MG RE SUPP
12.5000 mg | Freq: Four times a day (QID) | RECTAL | Status: DC | PRN
  Filled 2016-01-23: qty 1

## 2016-01-23 MED ORDER — DIPHENHYDRAMINE HCL 12.5 MG/5ML PO ELIX
12.5000 mg | ORAL_SOLUTION | Freq: Four times a day (QID) | ORAL | Status: DC | PRN
  Administered 2016-01-25 (×2): 25 mg via ORAL
  Filled 2016-01-23 (×2): qty 10

## 2016-01-23 MED ORDER — PREGABALIN 25 MG PO CAPS
50.0000 mg | ORAL_CAPSULE | Freq: Every day | ORAL | Status: DC
  Administered 2016-01-24: 50 mg via ORAL
  Filled 2016-01-23: qty 2

## 2016-01-23 MED ORDER — PROCHLORPERAZINE MALEATE 5 MG PO TABS: 5.0000 mg | ORAL_TABLET | Freq: Four times a day (QID) | ORAL | Status: DC | PRN

## 2016-01-23 MED ORDER — DIPHENHYDRAMINE HCL 25 MG PO CAPS: 25.0000 mg | ORAL_CAPSULE | Freq: Four times a day (QID) | ORAL | Status: DC | PRN

## 2016-01-23 MED ORDER — POLYETHYLENE GLYCOL 3350 17 G PO PACK
17.0000 g | PACK | Freq: Every day | ORAL | Status: DC | PRN
  Administered 2016-01-24: 17 g via ORAL
  Filled 2016-01-23: qty 1

## 2016-01-23 MED ORDER — CITALOPRAM HYDROBROMIDE 10 MG PO TABS
40.0000 mg | ORAL_TABLET | Freq: Every day | ORAL | Status: DC
  Administered 2016-01-23 – 2016-01-29 (×7): 40 mg via ORAL
  Filled 2016-01-23 (×7): qty 4

## 2016-01-23 MED ORDER — LIDOCAINE HCL 2 % EX GEL
CUTANEOUS | Status: DC | PRN
  Filled 2016-01-23: qty 5

## 2016-01-23 MED ORDER — ACETAMINOPHEN 325 MG PO TABS
325.0000 mg | ORAL_TABLET | ORAL | Status: DC | PRN
  Administered 2016-01-26: 325 mg via ORAL
  Filled 2016-01-23: qty 2

## 2016-01-23 MED ORDER — OXYCODONE HCL 10 MG PO TABS: 10.0000 mg | ORAL_TABLET | Freq: Three times a day (TID) | ORAL | Status: DC | PRN

## 2016-01-23 MED ORDER — SENNOSIDES-DOCUSATE SODIUM 8.6-50 MG PO TABS
2.0000 | ORAL_TABLET | Freq: Every day | ORAL | Status: DC
  Administered 2016-01-23 – 2016-01-29 (×7): 2 via ORAL
  Filled 2016-01-23 (×7): qty 2

## 2016-01-23 MED ORDER — BISACODYL 10 MG RE SUPP: 10.0000 mg | Freq: Every day | RECTAL | Status: DC | PRN

## 2016-01-23 MED ORDER — SODIUM CHLORIDE 0.9% FLUSH: 10.0000 mL | INTRAVENOUS | Status: DC | PRN

## 2016-01-23 MED ORDER — SODIUM CHLORIDE 0.9% FLUSH: 10.0000 mL | Freq: Two times a day (BID) | INTRAVENOUS | Status: DC

## 2016-01-23 MED ORDER — OXYCODONE HCL 5 MG PO TABS
10.0000 mg | ORAL_TABLET | Freq: Four times a day (QID) | ORAL | Status: DC | PRN
  Administered 2016-01-23 – 2016-01-25 (×5): 10 mg via ORAL
  Filled 2016-01-23 (×5): qty 2

## 2016-01-23 MED ORDER — RISPERIDONE 1 MG PO TABS
1.0000 mg | ORAL_TABLET | Freq: Every day | ORAL | Status: DC
  Administered 2016-01-23 – 2016-01-28 (×6): 1 mg via ORAL
  Filled 2016-01-23 (×6): qty 1

## 2016-01-23 MED ORDER — SODIUM CHLORIDE 0.9% FLUSH
10.0000 mL | Freq: Two times a day (BID) | INTRAVENOUS | Status: DC
  Administered 2016-01-24: 10 mL

## 2016-01-23 MED ORDER — FLEET ENEMA 7-19 GM/118ML RE ENEM: 1.0000 | ENEMA | Freq: Once | RECTAL | Status: DC | PRN

## 2016-01-23 MED ORDER — PREGABALIN 50 MG PO CAPS: 50.0000 mg | ORAL_CAPSULE | Freq: Every day | ORAL | Status: DC

## 2016-01-23 MED ORDER — PANTOPRAZOLE SODIUM 40 MG PO TBEC
40.0000 mg | DELAYED_RELEASE_TABLET | Freq: Every day | ORAL | Status: DC
  Administered 2016-01-24 – 2016-01-30 (×7): 40 mg via ORAL
  Filled 2016-01-23 (×7): qty 1

## 2016-01-23 MED ORDER — AMITRIPTYLINE HCL 50 MG PO TABS
50.0000 mg | ORAL_TABLET | Freq: Every day | ORAL | Status: DC
  Administered 2016-01-23 – 2016-01-29 (×7): 50 mg via ORAL
  Filled 2016-01-23 (×7): qty 1

## 2016-01-23 NOTE — Progress Notes (Signed)
Rehab admissions - I have approval for acute inpatient rehab admission for today.  I have called attending MD and I have medical clearance for inpatient rehab admission for today.  Bed available and will admit to inpatient rehab today.  Call me for questions.  #[phone]

## 2016-01-23 NOTE — H&P (View-Only) (Signed)
Physical Medicine and Rehabilitation Admission H&P    Chief Complaint  Patient presents with  . Fall  : HPI: Jeffery Gross is a 3-y.o. male with PMH of DDD, chronic back pain, anxiety with depression, HTN who was admitted on 01/03/16 after fall due to presumed syncope and was on the floor for 2-3 days with inability to walk.  He was found to have rhabdomyolysis with acute renal failure, BLE DVT, and muscle necrosis of RLE with bilateral compartment syndrome.  He was started on dialysis for nonoliguric AKI due to ATN and nephrology has been following for input.  He has required fasciotomy with multiple I & D procedures due to attempts at limb salvage.  He underewent B-AKA on 01/09/16 by Dr. Sharol Given.  ABLA managed with transfusion and IV iron to supllement low iron stores.  Right  IJ Tunneled HD cath placed on 06/30 by IVR/Dr. Vernard Gambles.  He is showing some signs of renal recovery with improvement in UOP and HD held since 07/01.  Pain control improving with improvement in participation. CIR recommended for follow up therapy.   Review of Systems  HENT: Negative for hearing loss.   Eyes: Negative for blurred vision and double vision.  Respiratory: Negative for cough and shortness of breath.   Cardiovascular: Negative for chest pain and palpitations.  Gastrointestinal: Positive for constipation. Negative for heartburn, nausea and abdominal pain.  Genitourinary: Positive for dysuria.  Musculoskeletal: Positive for myalgias and back pain.  Skin: Negative for itching and rash.  Neurological: Positive for tingling and sensory change. Negative for dizziness and headaches.  Psychiatric/Behavioral: Negative for depression. The patient has insomnia. The patient is not nervous/anxious.   All other systems reviewed and are negative.    Past Medical History  Diagnosis Date  . Chest pain   . DDD (degenerative disc disease), cervical   . Back pain   . HTN (hypertension)   . Depression      Past  Surgical History  Procedure Laterality Date  . Vasectomy    . Fasciotomy Bilateral 01/03/2016    Procedure: FASCIOTOMY;  Surgeon: Leandrew Koyanagi, MD;  Location: [place];  Service: Orthopedics;  Laterality: Bilateral;  . I&d extremity Bilateral 01/05/2016    Procedure: IRRIGATION AND DEBRIDEMENT BILATERAL LOWER EXTREMITIES; WOUND VAC CHANGE;  Surgeon: Leandrew Koyanagi, MD;  Location: [place];  Service: Orthopedics;  Laterality: Bilateral;  . I&d extremity Left 01/07/2016    Procedure: Irrigation and Debridement of Left Lower Extremity with Application of Wound Vac;  Surgeon: Leandrew Koyanagi, MD;  Location: [place];  Service: Orthopedics;  Laterality: Left;  . Amputation Bilateral 01/09/2016    Procedure: Bilateral Above Knee Amputation, Apply Wound VAC;  Surgeon: Newt Minion, MD;  Location: [place];  Service: Orthopedics;  Laterality: Bilateral;    Family History  Problem Relation Age of Onset  . Hypertension Father     Social History:  Divorced--plans on going home with first Ex-wife.  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  He reports that he has been smoking Cigars.  He does not have any smokeless tobacco history on file. He reports that he drinks alcohol. His drug history is not on file.     Allergies  Allergen Reactions  . Sulfacetamide Sodium Other (See Comments)    edema    Medications Prior to Admission  Medication Sig Dispense Refill  . ALPRAZolam (XANAX) 0.5 MG tablet Take 0.5 mg by mouth at  bedtime as needed for sleep.    Marland Kitchen amitriptyline (ELAVIL) 50 MG tablet Take 50 mg by mouth at bedtime.    Marland Kitchen amLODipine (NORVASC) 10 MG tablet Take 10 mg by mouth daily.    . cetirizine (ZYRTEC) 10 MG tablet Take 10 mg by mouth daily.    . citalopram (CELEXA) 40 MG tablet Take 40 mg by mouth daily.    . clonazePAM (KLONOPIN) 0.5 MG tablet Take 0.5 mg by mouth 2 (two) times daily as needed for anxiety.    . cyclobenzaprine (FLEXERIL) 10 MG tablet Take 10 mg by mouth  every 8 (eight) hours as needed. Muscle spasms  0  . hydrochlorothiazide (HYDRODIURIL) 25 MG tablet Take 25 mg by mouth daily.    . montelukast (SINGULAIR) 10 MG tablet Take 10 mg by mouth daily as needed. allergies  11  . oxyCODONE (ROXICODONE) 15 MG immediate release tablet Take 15 mg by mouth every 8 (eight) hours as needed. pain  0  . pantoprazole (PROTONIX) 40 MG tablet Take 40 mg by mouth daily.  11  . risperiDONE (RISPERDAL) 1 MG tablet Take 1 mg by mouth at bedtime.  5    Home: Home Living Family/patient expects to be discharged to:: Unsure Living Arrangements: Non-relatives/Friends Additional Comments: Patient unsure where he will go; one home has 17 steps to enter   Functional History: Prior Function Level of Independence: Independent Comments: Dance movement psychotherapist Status:  Mobility: Bed Mobility Overal bed mobility: Needs Assistance Bed Mobility: Rolling, Supine to Sit Rolling: Min assist Sidelying to sit: Supervision Supine to sit: Supervision Sit to supine: Supervision General bed mobility comments: min assist for clothing management and to lower rail to allow space for RLE as Cordarrell rolled into prone; encouragement given as this was quite painful Transfers Overall transfer level: Needs assistance Equipment used:  (slide board, drop arm BSC) Transfers: Lateral/Scoot Transfers Anterior-Posterior transfers: +2 safety/equipment, Min assist, +2 physical assistance, Max assist  Lateral/Scoot Transfers: Min assist General transfer comment: Min assist to steady wheelchair; noted good press up and over, less shear   Wheelchair Mobility Wheelchair mobility: Yes Wheelchair propulsion: Both upper extremities Wheelchair parts: Needs assistance Distance: 200 Wheelchair Assistance Details (indicate cue type and reason): Cues for technique, and to be aware of center of mass and tendency to tip in this stardard wheelchair; noted 3 instances when casters came up due to  some posterior tipping, and Usbaldo was able to feel when this occurred and problem-solve how to prevent tipping backwards; minguard assist for safety, especially going up an incline  ADL: ADL Overall ADL's : Needs assistance/impaired Eating/Feeding: Set up Grooming: Wash/dry hands, Wash/dry face, Set up, Sitting Upper Body Bathing: Minimal assitance, Sitting Lower Body Bathing: Total assistance, +2 for physical assistance, Bed level Upper Body Dressing : Minimal assistance, Sitting Lower Body Dressing: +2 for physical assistance, Total assistance, Bed level General ADL Comments: Family is bringing underwear and shorts for pt. Educated pt in LB dressing in sitting on bed leaning side to side or bridging.  Cognition: Cognition Overall Cognitive Status: Within Functional Limits for tasks assessed Orientation Level: Oriented X4 Cognition Arousal/Alertness: Awake/alert Behavior During Therapy: WFL for tasks assessed/performed Overall Cognitive Status: Within Functional Limits for tasks assessed    Blood pressure 106/65, pulse 85, temperature 97.9 F (36.6 C), temperature source Oral, resp. rate 17, height _0  (1.854 m), weight 74.5 kg (164 lb 3.9 oz), SpO2 98 %. Physical Exam  Nursing note and vitals reviewed. Constitutional: He is oriented  to person, place, and time. He appears well-developed and well-nourished. No distress.  HENT:  Head: Normocephalic and atraumatic.  Mouth/Throat: Oropharynx is clear and moist.  Neck: Normal range of motion. Neck supple.  Cardiovascular: Normal rate and regular rhythm.   No murmur heard. Respiratory: Effort normal and breath sounds normal. No stridor. No respiratory distress. He has no wheezes.  GI: Soft. Bowel sounds are normal. He exhibits no distension. There is no tenderness.  Musculoskeletal: He exhibits edema and tenderness.  B-AKA with sutures in place-clean,dry and intact.  Bilateral AKA sensitive to touch L >R.   Neurological: He is  alert and oriented to person, place, and time.  Motor: B/l UE 5/5 proximal to distal B/l LE: hip flexion 5/5 proximal Sensation intact to light touch   Skin: Skin is warm and dry. He is not diaphoretic.  Incision c/d/i.  Psychiatric: He has a normal mood and affect. His behavior is normal. Judgment and thought content normal.    Results for orders placed or performed during the hospital encounter of 01/03/16 (from the past 48 hour(s))  Renal function panel     Status: Abnormal   Collection Time: 01/22/16  4:09 AM  Result Value Ref Range   Sodium 138 135 - 145 mmol/L   Potassium 3.9 3.5 - 5.1 mmol/L   Chloride 98 (L) 101 - 111 mmol/L   CO2 25 22 - 32 mmol/L   Glucose, Bld 104 (H) 65 - 99 mg/dL   BUN 63 (H) 6 - 20 mg/dL   Creatinine, Ser 3.84 (H) 0.61 - 1.24 mg/dL   Calcium 9.3 8.9 - 10.3 mg/dL   Phosphorus 6.3 (H) 2.5 - 4.6 mg/dL   Albumin 2.7 (L) 3.5 - 5.0 g/dL   GFR calc non Af Amer 16 (L) >60 mL/min   GFR calc Af Amer 19 (L) >60 mL/min    Comment: (NOTE) The eGFR has been calculated using the CKD EPI equation. This calculation has not been validated in all clinical situations. eGFR's persistently <60 mL/min signify possible Chronic Kidney Disease.    Anion gap 15 5 - 15  CBC     Status: Abnormal   Collection Time: 01/22/16  4:29 AM  Result Value Ref Range   WBC 9.0 4.0 - 10.5 K/uL   RBC 3.66 (L) 4.22 - 5.81 MIL/uL   Hemoglobin 10.2 (L) 13.0 - 17.0 g/dL   HCT 32.0 (L) 39.0 - 52.0 %   MCV 87.4 78.0 - 100.0 fL   MCH 27.9 26.0 - 34.0 pg   MCHC 31.9 30.0 - 36.0 g/dL   RDW 13.8 11.5 - 15.5 %   Platelets 311 150 - 400 K/uL  Renal function panel     Status: Abnormal   Collection Time: 01/23/16  5:52 AM  Result Value Ref Range   Sodium 139 135 - 145 mmol/L   Potassium 3.9 3.5 - 5.1 mmol/L   Chloride 99 (L) 101 - 111 mmol/L   CO2 27 22 - 32 mmol/L   Glucose, Bld 103 (H) 65 - 99 mg/dL   BUN 68 (H) 6 - 20 mg/dL   Creatinine, Ser 3.52 (H) 0.61 - 1.24 mg/dL   Calcium 9.5 8.9  - 10.3 mg/dL   Phosphorus 5.7 (H) 2.5 - 4.6 mg/dL   Albumin 2.7 (L) 3.5 - 5.0 g/dL   GFR calc non Af Amer 18 (L) >60 mL/min   GFR calc Af Amer 21 (L) >60 mL/min    Comment: (NOTE) The eGFR has been calculated  using the CKD EPI equation. This calculation has not been validated in all clinical situations. eGFR's persistently <60 mL/min signify possible Chronic Kidney Disease.    Anion gap 13 5 - 15   No results found.     Medical Problem List and Plan: 1. 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  DVT Prophylaxis/Anticoagulation: Pharmaceutical: Lovenox 3. Pain Management:  On lyrica and elavil for management of neuropathy.  Continue oxycodone prn. Has not used IV fentanyl for 48 hours.  4. Mood: LCSW to follow for evaluation and support.  5. Neuropsych: This patient is capable of making decisions on his own behalf. 6. Skin/Wound Care: Monitor incisions daily for healing. Educate on pressure relief measures.  7. Fluids/Electrolytes/Nutrition: Monitor I/O. Protein supplement to help supplement low stores and to promote healing.  8. Acute renal failure: HD on hold. Continue to monitor UOP with strict I/O. Daily check of lytes.  9. Anemia:  Recheck CBC in am. Continue to monitor.  10. Depression with anxiety: Mood appears to be stable. Continue Risperdal and celexa.  11. Dysuria: He reports that he is voiding without difficulty. Will check UA/UCS--foley d/c 07/02 12. Constipation:  Will augment bowel program.  13. Tobacco abuse: Counsel 14. Transaminitis: Avoid hepatotoxic meds   Post Admission Physician Evaluation: 1. Functional deficits secondary  to b/l AKA. 2. Patient is admitted to receive collaborative, interdisciplinary care between the physiatrist, rehab nursing staff, and therapy team. 3. Patient's level of medical complexity and substantial therapy needs in context of that medical necessity cannot be provided at a lesser intensity of care such as a SNF. 4. Patient has  experienced substantial functional loss from his/her baseline which was documented above under the "Functional History" and "Functional Status" headings.  Judging by the patient's diagnosis, physical exam, and functional history, the patient has potential for functional progress which will result in measurable gains while on inpatient rehab.  These gains will be of substantial and practical use upon discharge  in facilitating mobility and self-care at the household level. 5. Physiatrist will provide 24 hour management of medical needs as well as oversight of the therapy plan/treatment and provide guidance as appropriate regarding the interaction of the two. 6. 24 hour rehab nursing will assist with bladder management, bowel management, safety, skin/wound care, disease management, medication administration, pain management and patient education and help integrate therapy concepts, techniques,education, etc. 7. PT will assess and treat for/with: Lower extremity strength, range of motion, stamina, balance, functional mobility, safety, adaptive techniques and equipment, woundcare, coping skills, pain control, education.   Goals are: Mod I at wheelchair level. 8. OT will assess and treat for/with: ADL's, functional mobility, safety, upper extremity strength, adaptive techniques and equipment, wound mgt, ego support, and community reintegration.   Goals are:  Mod I at wheelchair level. Therapy may not proceed with showering this patient. 9. Case Management and Social Worker will assess and treat for psychological issues and discharge planning. 10. Team conference will be held weekly to assess progress toward goals and to determine barriers to discharge. 11. Patient will receive at least 3 hours of therapy per day at least 5 days per week. 12. ELOS: 6-9 days.       13. Prognosis:  good  Delice Lesch, MD 01/23/2016

## 2016-01-23 NOTE — Progress Notes (Signed)
Retta Diones, RN Rehab Admission Coordinator Signed Physical Medicine and Rehabilitation PMR Pre-admission 01/23/2016 11:14 AM  Related encounter: ED to Hosp-Admission (Current) from 01/03/2016 in [facility] Collapse All   PMR Admission Coordinator Pre-Admission Assessment  Patient: Jeffery Gross is an 55 y.o., male MRN: [id number] DOB: [birth year] Height: _0  (185.4 cm) Weight: 74.5 kg (164 lb 3.9 oz)  Insurance Information HMO: PPO: PCP: IPA: 80/20: OTHER:  PRIMARY: BCBS Policy#: [id number] Subscriber: Jeffery Gross CM Name: Jeffery Gross Phone#: [phone] Fax#: [phone] Pre-Cert#: [id number] for 01/23/16 to 02/05/16 with update due on 02/04/16 Employer: Worked [employer] Benefits: Phone #: [phone] Name: Jeffery Gross. Date: 07/22/15 Deduct: $500 (met all) Out of Pocket Max: $800 (met all) Life Max: None CIR: 70% SNF: 70% Outpatient: 70% Co-Pay: 30% Home Health: 70% Co-Pay: 30% DME: 70% Co-Pay: 30% Providers: in network  Medicaid Application Date: Case Manager:  Disability Application Date: Case Worker:   Emergency [place]    Name Relation Home Work [place] Mother [phone]  [phone]   Jeffery Gross,Jeffery Gross Other   [phone]     Current Medical History  Patient Admitting Diagnosis: B AKA  History of Present Illness: A 55 y.o. right handed male with history of chronic back pain, hypertension, asthma. Independent prior to admission living with family. He had been living with his girlfriend but was removed from the home on a restraining order. Patient has an ex-wife who could assist on discharge but she works during  the day. Presented 01/03/2016 with bilateral lower extremity pain 2 days sustained in a fall with questionable brief loss of consciousness. Patient stated he hit the right side of his jaw on the ground. Cranial CT scan as well as CT maxillofacial findings negative for acute changes. CT of abdomen and pelvis negative. Troponin negative. Lower extremity Doppler showed DVT in the right popliteal, PT and peroneal veins with intravenous heparin initiated. Findings of elevated creatinine 5.40-6.44 as well as hyponatremia 122-125. Renal ultrasound normal without hydronephrosis. Nephrology consulted for a KI with rhabdo. Dialysis was later initiated with slow return of renal function and last hemodialysis 01/19/2016 with latest creatinine 3.52. Echocardiogram with ejection fraction of 60% no PFO no wall motion abnormalities. Orthopedic services consulted 01/09/2016 for muscle necrosis right lower extremity suspect bilateral lower leg compartment syndrome from rhabdo. 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 Sharol Given. Hospital course pain management. Intravenous heparin has been substituted with subcutaneous heparin. Acute on chronic anemia 10.2 and monitored. Physical and occupational therapy evaluations completed with recommendations of physical medicine rehabilitation consult. Patient to be admitted for a comprehensive inpatient rehabilitation program.   Past Medical History  Past Medical History  Diagnosis Date  . Chest pain   . DDD (degenerative disc disease), cervical   . Back pain   . HTN (hypertension)   . Depression     Family History  family history includes Hypertension in his  father.  Prior Rehab/Hospitalizations: Had outpatient rehab 2 yrs ago after knee surgery.  Has the patient had major surgery during 100 days prior to admission? No  Current Medications   Current facility-administered medications:  . 0.9 % sodium chloride infusion, , Intravenous, Continuous, Roberts Gaudy,  MD, Stopped at 01/10/16 0700 . 0.9 % sodium chloride infusion, , Intravenous, Once, Rogue Bussing, MD . 0.9 % sodium chloride infusion, , Intravenous, Once, Estanislado Emms, MD . 0.9 % sodium chloride infusion, , Intravenous, Once, Dwana Melena, MD . 0.9 % sodium chloride infusion, , Intravenous, Once, Rogue Bussing, MD . acetaminophen (TYLENOL) tablet 650 mg, 650 mg, Oral, Q6H PRN, 650 mg at 01/16/16 1909 **OR** acetaminophen (TYLENOL) suppository 650 mg, 650 mg, Rectal, Q6H PRN, Newt Minion, MD . amitriptyline (ELAVIL) tablet 50 mg, 50 mg, Oral, QHS, Katheren Shams, DO, 50 mg at 01/22/16 2129 . citalopram (CELEXA) tablet 40 mg, 40 mg, Oral, QHS, Lind Covert, MD, 40 mg at 01/22/16 2129 . diphenhydrAMINE (BENADRYL) capsule 25 mg, 25 mg, Oral, Q6H PRN, Katheren Shams, DO, 25 mg at 01/21/16 2158 . feeding supplement (PRO-STAT SUGAR FREE 64) liquid 30 mL, 30 mL, Oral, BID, Lind Covert, MD, 30 mL at 01/23/16 1004 . fentaNYL (SUBLIMAZE) injection 75 mcg, 75 mcg, Intravenous, Q4H PRN, Smiley Houseman, MD, 75 mcg at 01/21/16 2015 . heparin injection 5,000 Units, 5,000 Units, Subcutaneous, Q8H, Saverio Danker, PA-C, 5,000 Units at 01/23/16 [phone] . LORazepam (ATIVAN) injection 1 mg, 1 mg, Intravenous, Q4H PRN, Veatrice Bourbon, MD, 1 mg at 01/10/16 0312 . methocarbamol (ROBAXIN) tablet 500 mg, 500 mg, Oral, Q6H PRN, 500 mg at 01/20/16 1058 **OR** methocarbamol (ROBAXIN) 500 mg in dextrose 5 % 50 mL IVPB, 500 mg, Intravenous, Q6H PRN, Newt Minion, MD . metoCLOPramide (REGLAN) tablet 5-10 mg, 5-10 mg, Oral, Q8H PRN **OR** metoCLOPramide  (REGLAN) injection 5-10 mg, 5-10 mg, Intravenous, Q8H PRN, Newt Minion, MD . ondansetron (ZOFRAN) tablet 4 mg, 4 mg, Oral, Q6H PRN **OR** ondansetron (ZOFRAN) injection 4 mg, 4 mg, Intravenous, Q6H PRN, Newt Minion, MD . oxyCODONE (Oxy IR/ROXICODONE) immediate release tablet 10 mg, 10 mg, Oral, Q8H PRN, [place] Rum[place], DO, 10 mg at 01/23/16 1004 . pantoprazole (PROTONIX) EC tablet 40 mg, 40 mg, Oral, Daily, Katheren Shams, DO, 40 mg at 01/23/16 1004 . polyethylene glycol (MIRALAX / GLYCOLAX) packet 17 g, 17 g, Oral, Daily PRN, Sela Hua, MD, 17 g at 01/18/16 2223 . pregabalin (LYRICA) capsule 50 mg, 50 mg, Oral, Daily, Vivi Barrack, MD, 50 mg at 01/23/16 1004 . risperiDONE (RISPERDAL) tablet 1 mg, 1 mg, Oral, QHS, Katheren Shams, DO, 1 mg at 01/22/16 2129 . senna-docusate (Senokot-S) tablet 1 tablet, 1 tablet, Oral, QHS PRN, Sela Hua, MD, 1 tablet at 01/21/16 [phone] . sodium chloride flush (NS) 0.9 % injection 10-40 mL, 10-40 mL, Intracatheter, Q12H, Lind Covert, MD, 10 mL at 01/22/16 2135  Patients Current Diet: Diet renal with fluid restriction Fluid restriction:: 2000 mL Fluid; Room service appropriate?: Yes; Fluid consistency:: Thin  Precautions / Restrictions Precautions Precautions: Fall Precaution Comments: B LE wound vacs Restrictions Weight Bearing Restrictions: Yes RLE Weight Bearing: Non weight bearing LLE Weight Bearing: Non weight bearing   Has the patient had 2 or more falls or a fall with injury in the past year?Yes. Patient reports at least 10 falls in the last year with injuries on several of the falls.  Prior Activity Level Community (5-7x/wk): Went out daily. Worked Warehouse manager.  Home Assistive Devices / Equipment Home Assistive Devices/Equipment: None  Prior Device Use: Indicate devices/aids used by the patient prior to current illness, exacerbation or injury? None  Prior Functional Level Prior Function Level of Independence:  Independent  Comments: Garment/textile technologist  Self Care: Did the patient need help bathing, dressing, using the toilet or eating? Independent  Indoor Mobility: Did the patient need assistance with walking from room to room (with or without device)? Independent  Stairs: Did the patient need assistance with internal or external stairs (with or without device)? Independent  Functional Cognition: Did the patient need help planning regular tasks such as shopping or remembering to take medications? Independent  Current Functional Level Cognition  Overall Cognitive Status: Within Functional Limits for tasks assessed Orientation Level: Oriented X4   Extremity Assessment (includes Sensation/Coordination)  Upper Extremity Assessment: Overall WFL for tasks assessed (good strength, poor endurance)  Lower Extremity Assessment: Defer to PT evaluation RLE Deficits / Details: edematous, VAC dressing along end of residual limb; pt unable to actively flex hip/raise leg due to pain RLE: Unable to fully assess due to pain LLE Deficits / Details: edematous, VAC dressing along end of residual limb; pt unable to actively flex hip/raise leg due to pain    ADLs  Overall ADL's : Needs assistance/impaired Eating/Feeding: Set up Grooming: Wash/dry hands, Wash/dry face, Set up, Sitting Upper Body Bathing: Minimal assitance, Sitting Lower Body Bathing: Total assistance, +2 for physical assistance, Bed level Upper Body Dressing : Minimal assistance, Sitting Lower Body Dressing: +2 for physical assistance, Total assistance, Bed level General ADL Comments: Family is bringing underwear and shorts for pt. Educated pt in LB dressing in sitting on bed leaning side to side or bridging.    Mobility  Overal bed mobility: Needs Assistance Bed Mobility: Rolling, Supine to Sit Rolling: Min assist Sidelying to sit: Supervision Supine to sit: Supervision Sit to supine: Supervision General bed mobility comments:  min assist for clothing management and to lower rail to allow space for RLE as Zaahir rolled into prone; encouragement given as this was quite painful    Transfers  Overall transfer level: Needs assistance Equipment used: (slide board, drop arm BSC) Transfers: Lateral/Scoot Transfers Anterior-Posterior transfers: +2 safety/equipment, Min assist, +2 physical assistance, Max assist Lateral/Scoot Transfers: Min assist General transfer comment: Min assist to steady wheelchair; noted good press up and over, less shear    Ambulation / Gait / Stairs / Environmental consultant mobility: Yes Wheelchair propulsion: Both upper extremities Wheelchair parts: Needs assistance Distance: 200 Wheelchair Assistance Details (indicate cue type and reason): Cues for technique, and to be aware of center of mass and tendency to tip in this stardard wheelchair; noted 3 instances when casters came up due to some posterior tipping, and Micah was able to feel when this occurred and problem-solve how to prevent tipping backwards; minguard assist for safety, especially going up an incline    Posture / Balance Dynamic Sitting Balance Sitting balance - Comments: Able to come to fully upright sitting, and work on lateral leans with elbow prop and even opposite hip hike,  Balance Overall balance assessment: Needs assistance Sitting-balance support: No upper extremity supported Sitting balance-Leahy Scale: Good Sitting balance - Comments: Able to come to fully upright sitting, and work on lateral leans with elbow prop and even opposite hip hike,  Postural control: Posterior lean    Special needs/care consideration BiPAP/CPAP No CPM No Continuous Drip IV No  Dialysis Not since 01/19/16 with improved renal function  Life Vest No Oxygen No Special Bed No Trach Size No Wound Vac (area)  Skin Has bilateral AKA surgical incisions  Bowel  mgmt: Last BM 01/19/16 Bladder mgmt: Voiding in urinal WDL Diabetic mgmt No  Contact Isolation: Yes    Previous Home Environment Living Arrangements: Non-relatives/Friends Home Care Services: No Additional Comments: Patient unsure where he will go; one home has 17 steps to enter  Discharge Living Setting Plans for Discharge Living Setting: House, Lives with (comment) (Plans home with ex-wife #1 Jeffery Gross.) Type of Home at Discharge: House Discharge Home Layout: One level Discharge Home Access: Stairs to enter (Plans for a ramp in process) Entrance Stairs-Number of Steps: 5-6 steps Does the patient have any problems obtaining your medications?: No  Social/Family/Support Systems Patient Roles: Parent, Other (Comment) (Is divorced. 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, maintenance (IT): Intermittent Discharge Plan Discussed with Primary Caregiver: Yes Is Caregiver In Agreement with Plan?: Yes Does Caregiver/Family have Issues with Lodging/Transportation while Pt is in Rehab?: No  Goals/Additional Needs Patient/Family Goal for Rehab: PT/OT mod I Expected length of stay: 5-7 days Cultural Considerations: None Dietary Needs: Renal diet, thin liquids Equipment Needs: TBD Special Service Needs: Has a HD catheter in but HD finished on 01/19/16 with improving renal function Pt/Family Agrees to Admission and willing to participate: Yes Program Orientation Provided & Reviewed with Pt/Caregiver Including Roles & Responsibilities: Yes  Decrease burden of Care through IP rehab admission: N/A  Possible need for SNF placement upon discharge: Not planned  Patient Condition: This patient's medical and functional status has  changed since the consult dated: 01/21/16 in which the Rehabilitation Physician determined and documented that the patient's condition is appropriate for intensive rehabilitative care in an inpatient rehabilitation facility. See "History of Present Illness" (above) for medical update. Functional changes are: Currently requiring min assist for lateral/scoot transfers. Patient's medical and functional status update has been discussed with the Rehabilitation physician and patient remains appropriate for inpatient rehabilitation. Will admit to inpatient rehab today.  Preadmission Screen Completed By: Retta Diones, 01/23/2016 11:30 AM ______________________________________________________________________  Discussed status with Dr. Posey Pronto on 01/23/16 at 1129 and received telephone approval for admission today.  Admission Coordinator: Retta Diones, time1129/Date07/05/17          Cosigned by: Ankit Lorie Phenix, MD at 01/23/2016 12:38 PM  Revision History     Date/Time User Provider Type Action   01/23/2016 12:38 PM Ankit Lorie Phenix, MD Physician Cosign   01/23/2016 11:30 AM Retta Diones, RN Rehab Admission Coordinator Sign
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
## 2016-01-23 NOTE — Progress Notes (Signed)
Physical Therapy Treatment Patient Details Name: Jeffery Gross MRN: [id number] DOB: 1960/07/30 Today's Date: 01/23/2016    History of Present Illness Jeffery Gross is a 55 y.o. 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 PMH is significant for chronic back pain, back surgery, HTN, depression and anxiety.     PT Comments    Showing better control of wheelchair, and good management of brakes and removable armrests; Pt is excited to go to CIR and I anticipate excellent progress.  Follow Up Recommendations  CIR (to CIR today!)     Equipment Recommendations  Other (comment) (To be determined at CIR)    Recommendations for Other Services       Precautions / Restrictions Precautions Precautions: Fall Restrictions Weight Bearing Restrictions: Yes RLE Weight Bearing: Non weight bearing LLE Weight Bearing: Non weight bearing    Mobility  Bed Mobility Overal bed mobility: Modified Independent Bed Mobility: Supine to Sit   Sidelying to sit: Supervision       General bed mobility comments: He is getting close to being able to push self up to sit withou rails  Transfers Overall transfer level: Needs assistance Equipment used:  (wheelchair) Transfers: Lateral/Scoot Transfers          Lateral/Scoot Transfers: Min assist General transfer comment: verbal cues for set up of w/c, use of brakes and to steady w/c; good press-up getting back to bed  Ambulation/Gait                 Administratortairs            Wheelchair Mobility Wheelchair Mobility Wheelchair mobility: Yes Wheelchair propulsion: Both upper extremities Wheelchair parts: Needs assistance Distance: 500 (at least) Wheelchair Assistance Details (indicate cue type and reason): Cues for technique, and to be aware of center of mass  and tendency to tip in this stardard wheelchair; noted 3 instances when casters came up due to some posterior tipping, and [name] was able to feel when this occurred and problem-solve how to prevent tipping backwards; minguard assist for safety, especially going up an incline  Modified Rankin (Stroke Patients Only)       Balance     Sitting balance-Leahy Scale: Good                              Cognition Arousal/Alertness: Awake/alert Behavior During Therapy: WFL for tasks assessed/performed Overall Cognitive Status: Within Functional Limits for tasks assessed                      Exercises      General Comments        Pertinent Vitals/Pain Pain Assessment: Faces Pain Score: 0-No pain Faces Pain Scale: No hurt Pain Descriptors / Indicators:  (Guarding residual limbs ) Pain Intervention(s): Premedicated before session    Home Living                      Prior Function            PT Goals (current goals can now be found in the care plan section) Acute Rehab PT Goals Patient Stated Goal: get better PT Goal Formulation: With patient Time For Goal Achievement: 02/04/16 Potential to Achieve Goals: Good Progress towards PT goals: Progressing toward  goals    Frequency  Min 3X/week    PT Plan Current plan remains appropriate    Co-evaluation             End of Session   Activity Tolerance: Patient tolerated treatment well Patient left: in bed;with family/visitor present (getting ready to eat lunch)     Time: 1200-1230 (times are approximate) PT Time Calculation (min) (ACUTE ONLY): 30 min  Charges:  $Wheel Chair Management: 8-22 mins                    G Codes:      Olen PelGarrigan, Liandro Thelin Hamff 01/23/2016, 12:56 PM  Van ClinesHolly Freedom Ped[place], [place]PT  Acute Rehabilitation Services Pager [phone] Office [phone]

## 2016-01-23 NOTE — H&P (Signed)
  Physical Medicine and Rehabilitation Admission H&P    Chief Complaint  Patient presents with  . Fall  : HPI: Jeffery Gross is a 54-y.o. male with PMH of DDD, chronic back pain, anxiety with depression, HTN who was admitted on 01/03/16 after fall due to presumed syncope and was on the floor for 2-3 days with inability to walk.  He was found to have rhabdomyolysis with acute renal failure, BLE DVT, and muscle necrosis of RLE with bilateral compartment syndrome.  He was started on dialysis for nonoliguric AKI due to ATN and nephrology has been following for input.  He has required fasciotomy with multiple I & D procedures due to attempts at limb salvage.  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  ABLA managed with transfusion and IV iron to supllement low iron stores.  Right  IJ Tunneled HD cath placed on 06/30 by IVR/Dr. Hassell.  He is showing some signs of renal recovery with improvement in UOP and HD held since 07/01.  Pain control improving with improvement in participation. CIR recommended for follow up therapy.   Review of Systems  HENT: Negative for hearing loss.   Eyes: Negative for blurred vision and double vision.  Respiratory: Negative for cough and shortness of breath.   Cardiovascular: Negative for chest pain and palpitations.  Gastrointestinal: Positive for constipation. Negative for heartburn, nausea and abdominal pain.  Genitourinary: Positive for dysuria.  Musculoskeletal: Positive for myalgias and back pain.  Skin: Negative for itching and rash.  Neurological: Positive for tingling and sensory change. Negative for dizziness and headaches.  Psychiatric/Behavioral: Negative for depression. The patient has insomnia. The patient is not nervous/anxious.   All other systems reviewed and are negative.    Past Medical History  Diagnosis Date  . Chest pain   . DDD (degenerative disc disease), cervical   . Back pain   . HTN (hypertension)   . Depression      Past  Surgical History  Procedure Laterality Date  . Vasectomy    . Fasciotomy Bilateral 01/03/2016    Procedure: FASCIOTOMY;  Surgeon: Naiping M Xu, MD;  Location: [facility] OR;  Service: Orthopedics;  Laterality: Bilateral;  . I&d extremity Bilateral 01/05/2016    Procedure: IRRIGATION AND DEBRIDEMENT BILATERAL LOWER EXTREMITIES; WOUND VAC CHANGE;  Surgeon: Naiping M Xu, MD;  Location: [facility] OR;  Service: Orthopedics;  Laterality: Bilateral;  . I&d extremity Left 01/07/2016    Procedure: Irrigation and Debridement of Left Lower Extremity with Application of Wound Vac;  Surgeon: Naiping M Xu, MD;  Location: [facility] OR;  Service: Orthopedics;  Laterality: Left;  . Amputation Bilateral 01/09/2016    Procedure: Bilateral Above Knee Amputation, Apply Wound VAC;  Surgeon: Marcus V Duda, MD;  Location: [facility] OR;  Service: Orthopedics;  Laterality: Bilateral;    Family History  Problem Relation Age of Onset  . Hypertension Father     Social History:  Divorced--plans on going home with first Ex-wife.  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  He reports that he has been smoking Cigars.  He does not have any smokeless tobacco history on file. He reports that he drinks alcohol. His drug history is not on file.     Allergies  Allergen Reactions  . Sulfacetamide Sodium Other (See Comments)    edema    Medications Prior to Admission  Medication Sig Dispense Refill  . ALPRAZolam (XANAX) 0.5 MG tablet Take 0.5 mg by mouth at   bedtime as needed for sleep.    . amitriptyline (ELAVIL) 50 MG tablet Take 50 mg by mouth at bedtime.    . amLODipine (NORVASC) 10 MG tablet Take 10 mg by mouth daily.    . cetirizine (ZYRTEC) 10 MG tablet Take 10 mg by mouth daily.    . citalopram (CELEXA) 40 MG tablet Take 40 mg by mouth daily.    . clonazePAM (KLONOPIN) 0.5 MG tablet Take 0.5 mg by mouth 2 (two) times daily as needed for anxiety.    . cyclobenzaprine (FLEXERIL) 10 MG tablet Take 10 mg by mouth  every 8 (eight) hours as needed. Muscle spasms  0  . hydrochlorothiazide (HYDRODIURIL) 25 MG tablet Take 25 mg by mouth daily.    . montelukast (SINGULAIR) 10 MG tablet Take 10 mg by mouth daily as needed. allergies  11  . oxyCODONE (ROXICODONE) 15 MG immediate release tablet Take 15 mg by mouth every 8 (eight) hours as needed. pain  0  . pantoprazole (PROTONIX) 40 MG tablet Take 40 mg by mouth daily.  11  . risperiDONE (RISPERDAL) 1 MG tablet Take 1 mg by mouth at bedtime.  5    Home: Home Living Family/patient expects to be discharged to:: Unsure Living Arrangements: Non-relatives/Friends Additional Comments: Patient unsure where he will go; one home has 17 steps to enter   Functional History: Prior Function Level of Independence: Independent Comments: industrial plumber  Functional Status:  Mobility: Bed Mobility Overal bed mobility: Needs Assistance Bed Mobility: Rolling, Supine to Sit Rolling: Min assist Sidelying to sit: Supervision Supine to sit: Supervision Sit to supine: Supervision General bed mobility comments: min assist for clothing management and to lower rail to allow space for RLE as Jeffery Gross rolled into prone; encouragement given as this was quite painful Transfers Overall transfer level: Needs assistance Equipment used:  (slide board, drop arm BSC) Transfers: Lateral/Scoot Transfers Anterior-Posterior transfers: +2 safety/equipment, Min assist, +2 physical assistance, Max assist  Lateral/Scoot Transfers: Min assist General transfer comment: Min assist to steady wheelchair; noted good press up and over, less shear   Wheelchair Mobility Wheelchair mobility: Yes Wheelchair propulsion: Both upper extremities Wheelchair parts: Needs assistance Distance: 200 Wheelchair Assistance Details (indicate cue type and reason): Cues for technique, and to be aware of center of mass and tendency to tip in this stardard wheelchair; noted 3 instances when casters came up due to  some posterior tipping, and Jeffery Gross was able to feel when this occurred and problem-solve how to prevent tipping backwards; minguard assist for safety, especially going up an incline  ADL: ADL Overall ADL's : Needs assistance/impaired Eating/Feeding: Set up Grooming: Wash/dry hands, Wash/dry face, Set up, Sitting Upper Body Bathing: Minimal assitance, Sitting Lower Body Bathing: Total assistance, +2 for physical assistance, Bed level Upper Body Dressing : Minimal assistance, Sitting Lower Body Dressing: +2 for physical assistance, Total assistance, Bed level General ADL Comments: Family is bringing underwear and shorts for pt. Educated pt in LB dressing in sitting on bed leaning side to side or bridging.  Cognition: Cognition Overall Cognitive Status: Within Functional Limits for tasks assessed Orientation Level: Oriented X4 Cognition Arousal/Alertness: Awake/alert Behavior During Therapy: WFL for tasks assessed/performed Overall Cognitive Status: Within Functional Limits for tasks assessed    Blood pressure 106/65, pulse 85, temperature 97.9 F (36.6 C), temperature source Oral, resp. rate 17, height 6' 1" (1.854 m), weight 74.5 kg (164 lb 3.9 oz), SpO2 98 %. Physical Exam  Nursing note and vitals reviewed. Constitutional: He is oriented   to person, place, and time. He appears well-developed and well-nourished. No distress.  HENT:  Head: Normocephalic and atraumatic.  Mouth/Throat: Oropharynx is clear and moist.  Neck: Normal range of motion. Neck supple.  Cardiovascular: Normal rate and regular rhythm.   No murmur heard. Respiratory: Effort normal and breath sounds normal. No stridor. No respiratory distress. He has no wheezes.  GI: Soft. Bowel sounds are normal. He exhibits no distension. There is no tenderness.  Musculoskeletal: He exhibits edema and tenderness.  B-AKA with sutures in place-clean,dry and intact.  Bilateral AKA sensitive to touch L >R.   Neurological: He is  alert and oriented to person, place, and time.  Motor: B/l UE 5/5 proximal to distal B/l LE: hip flexion 5/5 proximal Sensation intact to light touch   Skin: Skin is warm and dry. He is not diaphoretic.  Incision c/d/i.  Psychiatric: He has a normal mood and affect. His behavior is normal. Judgment and thought content normal.    Results for orders placed or performed during the hospital encounter of 01/03/16 (from the past 48 hour(s))  Renal function panel     Status: Abnormal   Collection Time: 01/22/16  4:09 AM  Result Value Ref Range   Sodium 138 135 - 145 mmol/L   Potassium 3.9 3.5 - 5.1 mmol/L   Chloride 98 (L) 101 - 111 mmol/L   CO2 25 22 - 32 mmol/L   Glucose, Bld 104 (H) 65 - 99 mg/dL   BUN 63 (H) 6 - 20 mg/dL   Creatinine, Ser 3.84 (H) 0.61 - 1.24 mg/dL   Calcium 9.3 8.9 - 10.3 mg/dL   Phosphorus 6.3 (H) 2.5 - 4.6 mg/dL   Albumin 2.7 (L) 3.5 - 5.0 g/dL   GFR calc non Af Amer 16 (L) >60 mL/min   GFR calc Af Amer 19 (L) >60 mL/min    Comment: (NOTE) The eGFR has been calculated using the CKD EPI equation. This calculation has not been validated in all clinical situations. eGFR's persistently <60 mL/min signify possible Chronic Kidney Disease.    Anion gap 15 5 - 15  CBC     Status: Abnormal   Collection Time: 01/22/16  4:29 AM  Result Value Ref Range   WBC 9.0 4.0 - 10.5 K/uL   RBC 3.66 (L) 4.22 - 5.81 MIL/uL   Hemoglobin 10.2 (L) 13.0 - 17.0 g/dL   HCT 32.0 (L) 39.0 - 52.0 %   MCV 87.4 78.0 - 100.0 fL   MCH 27.9 26.0 - 34.0 pg   MCHC 31.9 30.0 - 36.0 g/dL   RDW 13.8 11.5 - 15.5 %   Platelets 311 150 - 400 K/uL  Renal function panel     Status: Abnormal   Collection Time: 01/23/16  5:52 AM  Result Value Ref Range   Sodium 139 135 - 145 mmol/L   Potassium 3.9 3.5 - 5.1 mmol/L   Chloride 99 (L) 101 - 111 mmol/L   CO2 27 22 - 32 mmol/L   Glucose, Bld 103 (H) 65 - 99 mg/dL   BUN 68 (H) 6 - 20 mg/dL   Creatinine, Ser 3.52 (H) 0.61 - 1.24 mg/dL   Calcium 9.5 8.9  - 10.3 mg/dL   Phosphorus 5.7 (H) 2.5 - 4.6 mg/dL   Albumin 2.7 (L) 3.5 - 5.0 g/dL   GFR calc non Af Amer 18 (L) >60 mL/min   GFR calc Af Amer 21 (L) >60 mL/min    Comment: (NOTE) The eGFR has been calculated   using the CKD EPI equation. This calculation has not been validated in all clinical situations. eGFR's persistently <60 mL/min signify possible Chronic Kidney Disease.    Anion gap 13 5 - 15   No results found.     Medical Problem List and Plan: 1. 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  DVT Prophylaxis/Anticoagulation: Pharmaceutical: Lovenox 3. Pain Management:  On lyrica and elavil for management of neuropathy.  Continue oxycodone prn. Has not used IV fentanyl for 48 hours.  4. Mood: LCSW to follow for evaluation and support.  5. Neuropsych: This patient is capable of making decisions on his own behalf. 6. Skin/Wound Care: Monitor incisions daily for healing. Educate on pressure relief measures.  7. Fluids/Electrolytes/Nutrition: Monitor I/O. Protein supplement to help supplement low stores and to promote healing.  8. Acute renal failure: HD on hold. Continue to monitor UOP with strict I/O. Daily check of lytes.  9. Anemia:  Recheck CBC in am. Continue to monitor.  10. Depression with anxiety: Mood appears to be stable. Continue Risperdal and celexa.  11. Dysuria: He reports that he is voiding without difficulty. Will check UA/UCS--foley d/c 07/02 12. Constipation:  Will augment bowel program.  13. Tobacco abuse: Counsel 14. Transaminitis: Avoid hepatotoxic meds   Post Admission Physician Evaluation: 1. Functional deficits secondary  to b/l AKA. 2. Patient is admitted to receive collaborative, interdisciplinary care between the physiatrist, rehab nursing staff, and therapy team. 3. Patient's level of medical complexity and substantial therapy needs in context of that medical necessity cannot be provided at a lesser intensity of care such as a SNF. 4. Patient has  experienced substantial functional loss from his/her baseline which was documented above under the "Functional History" and "Functional Status" headings.  Judging by the patient's diagnosis, physical exam, and functional history, the patient has potential for functional progress which will result in measurable gains while on inpatient rehab.  These gains will be of substantial and practical use upon discharge  in facilitating mobility and self-care at the household level. 5. Physiatrist will provide 24 hour management of medical needs as well as oversight of the therapy plan/treatment and provide guidance as appropriate regarding the interaction of the two. 6. 24 hour rehab nursing will assist with bladder management, bowel management, safety, skin/wound care, disease management, medication administration, pain management and patient education and help integrate therapy concepts, techniques,education, etc. 7. PT will assess and treat for/with: Lower extremity strength, range of motion, stamina, balance, functional mobility, safety, adaptive techniques and equipment, woundcare, coping skills, pain control, education.   Goals are: Mod I at wheelchair level. 8. OT will assess and treat for/with: ADL's, functional mobility, safety, upper extremity strength, adaptive techniques and equipment, wound mgt, ego support, and community reintegration.   Goals are:  Mod I at wheelchair level. Therapy may not proceed with showering this patient. 9. Case Management and Social Worker will assess and treat for psychological issues and discharge planning. 10. Team conference will be held weekly to assess progress toward goals and to determine barriers to discharge. 11. Patient will receive at least 3 hours of therapy per day at least 5 days per week. 12. ELOS: 6-9 days.       13. Prognosis:  good  Avaree Gilberti, MD 01/23/2016 

## 2016-01-23 NOTE — PMR Pre-admission (Signed)
PMR Admission Coordinator Pre-Admission Assessment  Patient: Jeffery Gross is an 55 y.o., male MRN: [id number] DOB: 21-Mar-1961 Height: 6' 1" (185.4 cm) Weight: 74.5 kg (164 lb 3.9 oz)              Insurance Information HMO:     PPO:       PCP:       IPA:       80/20:       OTHER:   PRIMARY: BCBS      Policy#: [id number]      Subscriber: Ander Gaster CM Name: Celso Amy      Phone#: [phone]     Fax#: [phone] Pre-Cert#: [id number] for 01/23/16 to 02/05/16 with update due on 02/04/16     Employer: Worked [employer] Benefits:  Phone #: [phone]     Name: Leonie Man. Date: 07/22/15     Deduct: $500 (met all)      Out of Pocket Max: $800 (met all)      Life Max: None CIR: 70%      SNF: 70% Outpatient: 70%     Co-Pay: 30% Home Health: 70%      Co-Pay: 30% DME: 70%     Co-Pay: 30% Providers: in network  Medicaid Application Date:        Case Manager:   Disability Application Date:        Case Worker:    Emergency [place]    Name Relation Home Work [place] Mother [phone]  [phone]   Tepfer,Helen Other   [phone]     Current Medical History  Patient Admitting Diagnosis:  B AKA  History of Present Illness: A 55 y.o. right handed male with history of chronic back pain, hypertension, asthma. Independent prior to admission living with family. He had been living with his girlfriend but was removed from the home on a restraining order. Patient has an ex-wife who could assist on discharge but she works during the day. Presented 01/03/2016 with bilateral lower extremity pain 2 days sustained in a fall with questionable brief loss of consciousness. Patient stated he hit the right side of his jaw on the ground. Cranial CT scan as well as CT maxillofacial findings negative for acute changes. CT of abdomen and pelvis negative. Troponin negative. Lower extremity Doppler showed DVT in the right popliteal, PT and peroneal veins with  intravenous heparin initiated. Findings of elevated creatinine 5.40-6.44 as well as hyponatremia 122-125. Renal ultrasound normal without hydronephrosis. Nephrology consulted for a KI with rhabdo. Dialysis was later initiated with slow return of renal function and last hemodialysis 01/19/2016 with latest creatinine 3.52. Echocardiogram with ejection fraction of 60% no PFO no wall motion abnormalities. Orthopedic services consulted 01/09/2016 for muscle necrosis right lower extremity suspect bilateral lower leg compartment syndrome from rhabdo. 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 Sharol Given. Hospital course pain management. Intravenous heparin has been substituted with subcutaneous heparin. Acute on chronic anemia 10.2 and monitored. Physical and occupational therapy evaluations completed with recommendations of physical medicine rehabilitation consult. Patient to be admitted for a comprehensive inpatient rehabilitation program.     Past Medical History  Past Medical History  Diagnosis Date  . Chest pain   . DDD (degenerative  disc disease), cervical   . Back pain   . HTN (hypertension)   . Depression     Family History  family history includes Hypertension in his father.  Prior Rehab/Hospitalizations: Had outpatient rehab 2 yrs ago after knee surgery.  Has the patient had major surgery during 100 days prior to admission? No  Current Medications   Current facility-administered medications:  .  0.9 %  sodium chloride infusion, , Intravenous, Continuous, Roberts Gaudy, MD, Stopped at 01/10/16 0700 .  0.9 %  sodium chloride infusion, , Intravenous, Once, Rogue Bussing, MD .  0.9 %   sodium chloride infusion, , Intravenous, Once, Estanislado Emms, MD .  0.9 %  sodium chloride infusion, , Intravenous, Once, Dwana Melena, MD .  0.9 %  sodium chloride infusion, , Intravenous, Once, Rogue Bussing, MD .  acetaminophen (TYLENOL) tablet 650 mg, 650 mg, Oral, Q6H PRN, 650 mg at 01/16/16 1909 **OR** acetaminophen (TYLENOL) suppository 650 mg, 650 mg, Rectal, Q6H PRN, Newt Minion, MD .  amitriptyline (ELAVIL) tablet 50 mg, 50 mg, Oral, QHS, Katheren Shams, DO, 50 mg at 01/22/16 2129 .  citalopram (CELEXA) tablet 40 mg, 40 mg, Oral, QHS, Lind Covert, MD, 40 mg at 01/22/16 2129 .  diphenhydrAMINE (BENADRYL) capsule 25 mg, 25 mg, Oral, Q6H PRN, Katheren Shams, DO, 25 mg at 01/21/16 2158 .  feeding supplement (PRO-STAT SUGAR FREE 64) liquid 30 mL, 30 mL, Oral, BID, Lind Covert, MD, 30 mL at 01/23/16 1004 .  fentaNYL (SUBLIMAZE) injection 75 mcg, 75 mcg, Intravenous, Q4H PRN, Smiley Houseman, MD, 75 mcg at 01/21/16 2015 .  heparin injection 5,000 Units, 5,000 Units, Subcutaneous, Q8H, Saverio Danker, PA-C, 5,000 Units at 01/23/16 [phone] .  LORazepam (ATIVAN) injection 1 mg, 1 mg, Intravenous, Q4H PRN, Veatrice Bourbon, MD, 1 mg at 01/10/16 0312 .  methocarbamol (ROBAXIN) tablet 500 mg, 500 mg, Oral, Q6H PRN, 500 mg at 01/20/16 1058 **OR** methocarbamol (ROBAXIN) 500 mg in dextrose 5 % 50 mL IVPB, 500 mg, Intravenous, Q6H PRN, Newt Minion, MD .  metoCLOPramide (REGLAN) tablet 5-10 mg, 5-10 mg, Oral, Q8H PRN **OR** metoCLOPramide (REGLAN) injection 5-10 mg, 5-10 mg, Intravenous, Q8H PRN, Newt Minion, MD .  ondansetron (ZOFRAN) tablet 4 mg, 4 mg, Oral, Q6H PRN **OR** ondansetron (ZOFRAN) injection 4 mg, 4 mg, Intravenous, Q6H PRN, Newt Minion, MD .  oxyCODONE (Oxy IR/ROXICODONE) immediate release tablet 10 mg, 10 mg, Oral, Q8H PRN,  N Rumley, DO, 10 mg at 01/23/16 1004 .  pantoprazole (PROTONIX) EC tablet 40 mg, 40 mg, Oral, Daily, Katheren Shams, DO, 40 mg at  01/23/16 1004 .  polyethylene glycol (MIRALAX / GLYCOLAX) packet 17 g, 17 g, Oral, Daily PRN, Sela Hua, MD, 17 g at 01/18/16 2223 .  pregabalin (LYRICA) capsule 50 mg, 50 mg, Oral, Daily, Vivi Barrack, MD, 50 mg at 01/23/16 1004 .  risperiDONE (RISPERDAL) tablet 1 mg, 1 mg, Oral, QHS, Katheren Shams, DO, 1 mg at 01/22/16 2129 .  senna-docusate (Senokot-S) tablet 1 tablet, 1 tablet, Oral, QHS PRN, Sela Hua, MD, 1 tablet at 01/21/16 [phone] .  sodium chloride flush (NS) 0.9 % injection 10-40 mL, 10-40 mL, Intracatheter, Q12H, Lind Covert, MD, 10 mL at 01/22/16 2135  Patients Current Diet: Diet renal with fluid restriction Fluid restriction:: 2000 mL Fluid; Room service appropriate?: Yes; Fluid consistency:: Thin  Precautions / Restrictions Precautions Precautions: Fall Precaution Comments: B  LE wound vacs Restrictions Weight Bearing Restrictions: Yes RLE Weight Bearing: Non weight bearing LLE Weight Bearing: Non weight bearing   Has the patient had 2 or more falls or a fall with injury in the past year?Yes.  Patient reports at least 10 falls in the last year with injuries on several of the falls.  Prior Activity Level Community (5-7x/wk): Went out daily.  Worked Warehouse manager.  Home Assistive Devices / Equipment Home Assistive Devices/Equipment: None  Prior Device Use: Indicate devices/aids used by the patient prior to current illness, exacerbation or injury? None  Prior Functional Level Prior Function Level of Independence: Independent Comments: Garment/textile technologist  Self Care: Did the patient need help bathing, dressing, using the toilet or eating?  Independent  Indoor Mobility: Did the patient need assistance with walking from room to room (with or without device)? Independent  Stairs: Did the patient need assistance with internal or external stairs (with or without device)? Independent  Functional Cognition: Did the patient need help planning regular tasks such  as shopping or remembering to take medications? Independent  Current Functional Level Cognition  Overall Cognitive Status: Within Functional Limits for tasks assessed Orientation Level: Oriented X4    Extremity Assessment (includes Sensation/Coordination)  Upper Extremity Assessment: Overall WFL for tasks assessed (good strength, poor endurance)  Lower Extremity Assessment: Defer to PT evaluation RLE Deficits / Details: edematous, VAC dressing along end of residual limb; pt unable to actively flex hip/raise leg due to pain RLE: Unable to fully assess due to pain LLE Deficits / Details: edematous, VAC dressing along end of residual limb; pt unable to actively flex hip/raise leg due to pain    ADLs  Overall ADL's : Needs assistance/impaired Eating/Feeding: Set up Grooming: Wash/dry hands, Wash/dry face, Set up, Sitting Upper Body Bathing: Minimal assitance, Sitting Lower Body Bathing: Total assistance, +2 for physical assistance, Bed level Upper Body Dressing : Minimal assistance, Sitting Lower Body Dressing: +2 for physical assistance, Total assistance, Bed level General ADL Comments: Family is bringing underwear and shorts for pt. Educated pt in LB dressing in sitting on bed leaning side to side or bridging.    Mobility  Overal bed mobility: Needs Assistance Bed Mobility: Rolling, Supine to Sit Rolling: Min assist Sidelying to sit: Supervision Supine to sit: Supervision Sit to supine: Supervision General bed mobility comments: min assist for clothing management and to lower rail to allow space for RLE as Trevaughn rolled into prone; encouragement given as this was quite painful    Transfers  Overall transfer level: Needs assistance Equipment used:  (slide board, drop arm BSC) Transfers: Lateral/Scoot Transfers Anterior-Posterior transfers: +2 safety/equipment, Min assist, +2 physical assistance, Max assist  Lateral/Scoot Transfers: Min assist General transfer comment: Min assist  to steady wheelchair; noted good press up and over, less shear    Ambulation / Gait / Stairs / Environmental consultant mobility: Yes Wheelchair propulsion: Both upper extremities Wheelchair parts: Needs assistance Distance: 200 Wheelchair Assistance Details (indicate cue type and reason): Cues for technique, and to be aware of center of mass and tendency to tip in this stardard wheelchair; noted 3 instances when casters came up due to some posterior tipping, and Denzel was able to feel when this occurred and problem-solve how to prevent tipping backwards; minguard assist for safety, especially going up an incline    Posture / Balance Dynamic Sitting Balance Sitting balance - Comments: Able to come to fully upright sitting, and work on lateral leans with elbow prop  and even opposite hip hike,  Balance Overall balance assessment: Needs assistance Sitting-balance support: No upper extremity supported Sitting balance-Leahy Scale: Good Sitting balance - Comments: Able to come to fully upright sitting, and work on lateral leans with elbow prop and even opposite hip hike,  Postural control: Posterior lean    Special needs/care consideration BiPAP/CPAP No CPM No Continuous Drip IV No  Dialysis Not since 01/19/16 with improved renal function         Life Vest No Oxygen No Special Bed No Trach Size No Wound Vac (area)      Skin Has bilateral AKA surgical incisions                           Bowel mgmt: Last BM 01/19/16 Bladder mgmt: Voiding in urinal WDL Diabetic mgmt No Contact Isolation: Yes    Previous Home Environment Living Arrangements: Non-relatives/Friends Home Care Services: No Additional Comments: Patient unsure where he will go; one home has 17 steps to enter  Discharge Living Setting Plans for Discharge Living Setting: House, Lives with (comment) (Plans home with ex-wife #1 Bonnita Nasuti.) Type of Home at Discharge: House Discharge Home Layout: One  level Discharge Home Access: Stairs to enter (Plans for a ramp in process) Entrance Stairs-Number of Steps: 5-6 steps Does the patient have any problems obtaining your medications?: No  Social/Family/Support Systems Patient Roles: Parent, Other (Comment) (Is divorced.  Has 3 children, ex-wife X 2) Contact Information: Everardo [place] - mom [phone] and cell [phone] Anticipated Caregiver: Timmie Foerster - ex-wife #1 Anticipated Caregiver's Contact Information: Bonnita Nasuti - ex-wife #1 cell [phone] Ability/Limitations of Caregiver: Bonnita Nasuti works fulltime as a Engineer, maintenance (IT): Intermittent Discharge Plan Discussed with Primary Caregiver: Yes Is Caregiver In Agreement with Plan?: Yes Does Caregiver/Family have Issues with Lodging/Transportation while Pt is in Rehab?: No  Goals/Additional Needs Patient/Family Goal for Rehab: PT/OT mod I Expected length of stay: 5-7 days Cultural Considerations: None Dietary Needs: Renal diet, thin liquids Equipment Needs: TBD Special Service Needs: Has a HD catheter in but HD finished on 01/19/16 with improving renal function Pt/Family Agrees to Admission and willing to participate: Yes Program Orientation Provided & Reviewed with Pt/Caregiver Including Roles  & Responsibilities: Yes  Decrease burden of Care through IP rehab admission: N/A  Possible need for SNF placement upon discharge: Not planned  Patient Condition: This patient's medical and functional status has changed since the consult dated: 01/21/16 in which the Rehabilitation Physician determined and documented that the patient's condition is appropriate for intensive rehabilitative care in an inpatient rehabilitation facility. See "History of Present Illness" (above) for medical update. Functional changes are: Currently requiring min assist for lateral/scoot transfers. Patient's medical and functional status update has been discussed with the Rehabilitation physician and patient  remains appropriate for inpatient rehabilitation. Will admit to inpatient rehab today.  Preadmission Screen Completed By:  Retta Diones, 01/23/2016 11:30 AM ______________________________________________________________________   Discussed status with Dr.  Posey Pronto on 01/23/16 at 1129 and received telephone approval for admission today.  Admission Coordinator:  Retta Diones, time1129/Date07/05/17

## 2016-01-23 NOTE — Progress Notes (Signed)
Patient ID: Jeffery Gross, male   DOB: Jun 01, 1961, 55 y.o.   MRN: [id number] [facility] Progress Note   Assessment/ Plan:   1. 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 Over the past 4 days, his urine output has been improving and overnight labs show signs of renal recovery with creatinine down from 3.8 to 3.5. Clinically, he does not have any indication for hemodialysis at this time-suspect that recovery is underway and he may be able to be discharged in the near future off hemodialysis. He does not have a permanent access-currently getting hemodialysis via right IJ TDC that should be removed after verification of consistent renal recovery prior to discharge from the hospital. 2. 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 Wound care/wound management per orthopedic surgery. Wound VAC management previously-currently removed 3. Anemia: Secondary to surgical losses/critical illness-recent labs significant for iron deficiency and he is status post intravenous iron. Not on Aranesp. 4. Chronic pain/narcotic dependency: Management per primary service.  Subjective:   Reports to be feeling better and very excited that his kidney function is now recovering.    Objective:   BP 106/65 mmHg  Pulse 85  Temp(Src) 97.9 F (36.6 C) (Oral)  Resp 17  Ht 6\' 1"  (1.854 m)  Wt 74.5 kg (164 lb 3.9 oz)  BMI 21.67 kg/m2  SpO2 98%  Intake/Output Summary (Last 24 hours) at 01/23/16 1016 Last data filed at 01/23/16 1008  Gross per 24 hour  Intake    942 ml  Output   2425 ml  Net  -1483 ml   Weight change: 0.2 kg (7.1 oz)  Physical Exam: WUJ:[id number]:Comfortably resting in bed CVS: Pulse regular rhythm, S1 and S2 normal Resp: Clear to auscultation bilaterally-no rales or rhonchi Abd: Soft, flat, nontender Ext: Status post bilateral above-knee amputations-stumps in clean/dry  dressings.  Imaging: No results found.  Labs: BMET  Recent Labs Lab 01/17/16 0436 01/18/16 0530 01/19/16 0527 01/20/16 0627 01/21/16 0323 01/22/16 0409 01/23/16 0552  NA 136 137 139 139 141 138 139  K 3.8 4.0 3.9 4.1 3.7 3.9 3.9  CL 97* 94* 94* 98* 92* 98* 99*  CO2 29 30 30 29 28 25 27   GLUCOSE 102* 97 106* 101* 113* 104* 103*  BUN 45* 55* 62* 37* 55* 63* 68*  CREATININE 6.73* 7.09* 6.63* 3.80* 4.13* 3.84* 3.52*  CALCIUM 8.0* 8.5* 8.9 8.9 9.9 9.3 9.5  PHOS  --  8.0*  --  5.5*  --  6.3* 5.7*   CBC  Recent Labs Lab 01/19/16 0527 01/20/16 0627 01/21/16 0323 01/22/16 0429  WBC  --  7.6 8.3 9.0  HGB 9.4* 10.1* 9.4* 10.2*  HCT 29.2* 32.2* 29.7* 32.0*  MCV  --  87.5 87.6 87.4  PLT  --  326 319 311   Medications:    . sodium chloride   Intravenous Once  . sodium chloride   Intravenous Once  . sodium chloride   Intravenous Once  . sodium chloride   Intravenous Once  . amitriptyline  50 mg Oral QHS  . citalopram  40 mg Oral QHS  . feeding supplement (PRO-STAT SUGAR FREE 64)  30 mL Oral BID  . heparin subcutaneous  5,000 Units Subcutaneous Q8H  . pantoprazole  40 mg Oral Daily  . pregabalin  50 mg Oral Daily  . risperiDONE  1 mg Oral QHS  . sodium chloride flush  10-40 mL Intracatheter Q12H   Zetta BillsJay Kye Silverstein,  MD 01/23/2016, 10:16 AM

## 2016-01-23 NOTE — Progress Notes (Signed)
Clarified patient's D/C disposition to CIR with Dr. Allena KatzPatel (nephrology) regarding patient's renal recovery. He states patient is likely not ESRD and will recover and therefore can be discharged to CIR without an issue.   Leanna BattlesEckelmann, Shawntina Diffee Eileen, RN.

## 2016-01-23 NOTE — Progress Notes (Signed)
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 right handed male with history of chronic back pain, hypertension, asthma. Independent prior to admission living alone. Patient has an ex-wife who could assist on discharge but she works during the day. Presented 01/03/2016 with bilateral lower extremity pain 2 days sustained in a fall with questionable brief loss of consciousness. Patient stated he hit the right side of his jaw on the ground. Cranial CT scan as well as CT maxillofacial findings negative for acute changes. CT of abdomen and pelvis negative. Troponin negative. Lower extremity Doppler showed DVT in the right popliteal, PT and peroneal veins with intravenous heparin initiated. Findings of elevated creatinine 5.40-6.44 as well as hyponatremia 122-125. Renal ultrasound normal without hydronephrosis. Nephrology consulted for a KI with rhabdo. Dialysis was later initiated with slow return of renal function. Echocardiogram with ejection fraction of 60% no PFO no wall motion abnormalities. Orthopedic services consulted 01/09/2016 for muscle necrosis right lower extremity suspect bilateral lower leg compartment syndrome from rhabdo. 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 Lajoyce Corners. Hospital course  pain management. Intravenous heparin has been substituted with subcutaneous heparin. Acute on chronic anemia 9.1 and monitored. Physical and occupational therapy evaluations completed with recommendations of physical medicine rehabilitation consult.   Review of Systems  Constitutional: Negative for fever and chills.  HENT: Negative for hearing loss.  Eyes: Negative for blurred vision and double vision.  Respiratory: Negative for cough and shortness of breath.  Cardiovascular: Positive for leg swelling. Negative for chest pain and palpitations.  Gastrointestinal: Positive for constipation. Negative for nausea and vomiting.  Genitourinary: Negative for dysuria and hematuria.  Musculoskeletal: Positive for back pain.   Lower extremity pain 2 weeks  Skin: Negative for rash.  Neurological: Negative for seizures, weakness and headaches.   Phantom limb pain  Psychiatric/Behavioral: Positive for depression.  All other systems reviewed and are negative.  Past Medical History  Diagnosis Date  . Chest pain   . DDD (degenerative disc disease), cervical   . Back pain   . HTN (hypertension)   . Depression    Past Surgical History  Procedure Laterality Date  . Vasectomy    . Fasciotomy Bilateral 01/03/2016    Procedure: FASCIOTOMY; Surgeon: Tarry Kos, MD; Location: [facility] OR; Service: Orthopedics; Laterality: Bilateral;  . I&d extremity Bilateral 01/05/2016    Procedure: IRRIGATION AND DEBRIDEMENT BILATERAL LOWER EXTREMITIES; WOUND VAC CHANGE; Surgeon: Tarry Kos, MD; Location: [facility] OR; Service: Orthopedics; Laterality: Bilateral;  . I&d extremity Left 01/07/2016    Procedure: Irrigation and Debridement of Left Lower Extremity with Application of Wound Vac; Surgeon: Tarry Kos, MD; Location: [facility] OR; Service: Orthopedics; Laterality: Left;  . Amputation Bilateral 01/09/2016    Procedure: Bilateral Above Knee Amputation,  Apply Wound VAC; Surgeon: Nadara Mustard, MD; Location: [facility] OR; Service: Orthopedics; Laterality: Bilateral;   Family History  Problem Relation Age of Onset  . Hypertension Father    Social History:  reports that he has been smoking Cigars. He does not have any smokeless tobacco history on file. He reports that he drinks alcohol. His drug history is not on file. Allergies:  Allergies  Allergen Reactions  . Sulfacetamide Sodium Other (See Comments)    edema   Medications Prior to Admission  Medication Sig Dispense Refill  . ALPRAZolam (XANAX) 0.5 MG tablet Take 0.5 mg by mouth at bedtime as needed for sleep.    Marland Kitchen amitriptyline (ELAVIL) 50 MG tablet Take 50 mg by mouth at bedtime.    Marland Kitchen amLODipine (NORVASC) 10 MG tablet Take 10 mg by mouth daily.    . cetirizine (ZYRTEC) 10 MG tablet Take 10 mg by mouth daily.    . citalopram (CELEXA) 40 MG tablet Take 40 mg by mouth daily.    . clonazePAM (KLONOPIN) 0.5 MG tablet Take 0.5 mg by mouth 2 (two) times daily as needed for anxiety.    . cyclobenzaprine (FLEXERIL) 10 MG tablet Take 10 mg by mouth every 8 (eight) hours as needed. Muscle spasms  0  . hydrochlorothiazide (HYDRODIURIL) 25 MG tablet Take 25 mg by mouth daily.    . montelukast (SINGULAIR) 10 MG tablet Take 10 mg by mouth daily as needed. allergies  11  . oxyCODONE (ROXICODONE) 15 MG immediate release tablet Take 15 mg by mouth every 8 (eight) hours as needed. pain  0  . pantoprazole (PROTONIX) 40 MG tablet Take 40 mg by mouth daily.  11  . risperiDONE (RISPERDAL) 1 MG tablet Take 1 mg by mouth at bedtime.  5    Home: Home Living Family/patient expects to be discharged to:: Unsure Living Arrangements: Non-relatives/Friends Additional Comments: Patient unsure where he will go; one home has 17 steps to enter  Functional History: Prior Function Level of Independence: Independent Comments:  Higher education careers adviser Status:  Mobility: Bed Mobility Overal bed mobility: Needs Assistance Bed Mobility: Supine to Sit, Rolling, Sit to Supine Rolling: Modified independent (Device/Increase time) Sidelying to sit: Supervision Supine to sit: Supervision Sit to supine: Supervision General bed mobility comments: Used bil rails to pull self to long sit in the bed; Used bed rails for stability; Noted good lateral lean R and L, worked on rotating 180 degrees to L and R in prep for transfer set up. Prrcticed weight shifting without UE' support. Performed Sitting balance activities without UE support. Balances well and weight shifts without UE support. Transfers Overall transfer level: Needs assistance Equipment used: (slide board, drop arm BSC) Transfers: Lateral/Scoot Transfers Anterior-Posterior transfers: +2 safety/equipment, Min assist, +2 physical assistance, Max assist Lateral/Scoot Transfers: Mod assist, +2 safety/equipment General transfer comment: pt did very well with lateral scoot transfer from bed - BSC - recliner usinf slide board and was able to use B UEs to raise buttocks for therapists to adjust seating and pad underneath pt      ADL: ADL Overall ADL's : Needs assistance/impaired Eating/Feeding: Set up Grooming: Wash/dry hands, Wash/dry face, Set up, Sitting Upper Body Bathing: Minimal assitance, Sitting Lower Body Bathing: Total assistance, +2 for physical assistance, Bed level Upper Body Dressing : Minimal assistance, Sitting Lower Body Dressing: +2 for physical assistance, Total assistance, Bed level  Cognition: Cognition Overall Cognitive Status: Within Functional Limits for tasks assessed Orientation Level: Oriented X4 Cognition Arousal/Alertness: Awake/alert Behavior During Therapy: WFL for tasks assessed/performed Overall Cognitive Status: Within Functional Limits for tasks assessed  Blood pressure 109/65, pulse  85, temperature 98 F (36.7 C),  temperature source Oral, resp. rate 18, height 6\' 1"  (1.854 m), weight 77.3 kg (170 lb 6.7 oz), SpO2 95 %. Physical Exam  Vitals reviewed. Constitutional: He is oriented to person, place, and time. He appears well-developed and well-nourished.  HENT:  Head: Normocephalic and atraumatic.  Eyes: Conjunctivae and EOM are normal.  Neck: Normal range of motion. Neck supple. No thyromegaly present.  Cardiovascular: Normal rate and regular rhythm.  Respiratory: Effort normal and breath sounds normal. No respiratory distress.  GI: Soft. Bowel sounds are normal. He exhibits no distension.  Musculoskeletal: He exhibits edema and tenderness (L>RLE).  Neurological: He is alert and oriented to person, place, and time.  Motor: B/l UE 5/5 proximal to distal B/l LE: hip flexion 5/5 proximal Sensation intact to light touch  Skin:  Bilateral AKA sites with VACs  Psychiatric: He has a normal mood and affect. His behavior is normal.     Lab Results Last 24 Hours    Results for orders placed or performed during the hospital encounter of 01/03/16 (from the past 24 hour(s))  CBC Status: Abnormal   Collection Time: 01/20/16 6:27 AM  Result Value Ref Range   WBC 7.6 4.0 - 10.5 K/uL   RBC 3.68 (L) 4.22 - 5.81 MIL/uL   Hemoglobin 10.1 (L) 13.0 - 17.0 g/dL   HCT 16.132.2 (L) 09.639.0 - 04.552.0 %   MCV 87.5 78.0 - 100.0 fL   MCH 27.4 26.0 - 34.0 pg   MCHC 31.4 30.0 - 36.0 g/dL   RDW 40.913.9 81.111.5 - 91.415.5 %   Platelets 326 150 - 400 K/uL  Renal function panel Status: Abnormal   Collection Time: 01/20/16 6:27 AM  Result Value Ref Range   Sodium 139 135 - 145 mmol/L   Potassium 4.1 3.5 - 5.1 mmol/L   Chloride 98 (L) 101 - 111 mmol/L   CO2 29 22 - 32 mmol/L   Glucose, Bld 101 (H) 65 - 99 mg/dL   BUN 37 (H) 6 - 20 mg/dL   Creatinine, Ser 7.823.80 (H) 0.61 - 1.24 mg/dL   Calcium 8.9 8.9 - 95.610.3 mg/dL   Phosphorus 5.5 (H) 2.5 -  4.6 mg/dL   Albumin 2.1 (L) 3.5 - 5.0 g/dL   GFR calc non Af Amer 17 (L) >60 mL/min   GFR calc Af Amer 19 (L) >60 mL/min   Anion gap 12 5 - 15  Basic metabolic panel Status: Abnormal   Collection Time: 01/21/16 3:23 AM  Result Value Ref Range   Sodium 141 135 - 145 mmol/L   Potassium 3.7 3.5 - 5.1 mmol/L   Chloride 92 (L) 101 - 111 mmol/L   CO2 28 22 - 32 mmol/L   Glucose, Bld 113 (H) 65 - 99 mg/dL   BUN 55 (H) 6 - 20 mg/dL   Creatinine, Ser 2.134.13 (H) 0.61 - 1.24 mg/dL   Calcium 9.9 8.9 - 08.610.3 mg/dL   GFR calc non Af Amer 15 (L) >60 mL/min   GFR calc Af Amer 17 (L) >60 mL/min   Anion gap 21 (H) 5 - 15  CBC Status: Abnormal   Collection Time: 01/21/16 3:23 AM  Result Value Ref Range   WBC 8.3 4.0 - 10.5 K/uL   RBC 3.39 (L) 4.22 - 5.81 MIL/uL   Hemoglobin 9.4 (L) 13.0 - 17.0 g/dL   HCT 57.829.7 (L) 46.939.0 - 62.952.0 %   MCV 87.6 78.0 - 100.0 fL   MCH 27.7 26.0 -  34.0 pg   MCHC 31.6 30.0 - 36.0 g/dL   RDW 62.9 52.8 - 41.3 %   Platelets 319 150 - 400 K/uL      Imaging Results (Last 48 hours)    No results found.    Assessment/Plan: Diagnosis: Bilateral AKA Labs and images independently reviewed. Records reviewed and summated above. Clean amputation daily with soap and water Monitor incision site for signs of infection or impending skin breakdown. Staples to remain in place for 3-4 weeks Stump shrinker, for edema control  Scar mobilization massaging to prevent soft tissue adherence Stump protector during therapies Prevent flexion contractures by implementing the following:  Encourage prone lying for 20-30 mins per day BID to avoid hip flexion Contractures if medically appropriate; Avoid prolonged sitting Post surgical pain control with oral medication Phantom limb pain control with physical modalities including desensitization  techniques (gentle self massage to the residual stump,hot packs if sensation iintact, [place]) and mirror therapy, TENS. If ineffective, consider pharmacological treatment for neuropathic pain (e.g gabapentin, pregabalin, amytriptalyine, duloxetine).   1. Does the need for close, 24 hr/day medical supervision in concert with the patient's rehab needs make it unreasonable for this patient to be served in a less intensive setting? Yes  2. Co-Morbidities requiring supervision/potential complications: chronic back pain (Biofeedback training with therapies to help reduce reliance on opiate pain medications, monitor pain control during therapies, and sedation at rest and titrate to maximum efficacy to ensure participation and gains in therapies), HTN (monitor and provide prns in accordance with increased physical exertion and pain), asthma (monitor RR and O2 sats with increased activity), hyponatremia (cont to monitor, appears to have stabilized), AKI on CKD (avoid nephrotoxic meds), ABLA on chronic anemia (transfuse if necessary to ensure appropriate perfusion for increased activity tolerance), transaminitis (avoid hepatotoxic meds) 3. Due to bladder management, bowel management, safety, skin/wound care, disease management, medication administration, pain management and patient education, does the patient require 24 hr/day rehab nursing? Yes 4. Does the patient require coordinated care of a physician, rehab nurse, PT (1-2 hrs/day, 5 days/week) and OT (1-2 hrs/day, 5 days/week) to address physical and functional deficits in the context of the above medical diagnosis(es)? Yes Addressing deficits in the following areas: balance, endurance, strength, transferring, bowel/bladder control, bathing, dressing, toileting and psychosocial support 5. Can the patient actively participate in an intensive therapy program of at least 3 hrs of therapy per day at least 5 days per week? Yes 6. The potential for patient to make  measurable gains while on inpatient rehab is excellent and good 7. Anticipated functional outcomes upon discharge from inpatient rehab are modified independent with PT, modified independent with OT, n/a with SLP. 8. Estimated rehab length of stay to reach the above functional goals is: Likely 5-7 days. 9. Does the patient have adequate social supports and living environment to accommodate these discharge functional goals? Yes 10. Anticipated D/C setting: Home 11. Anticipated post D/C treatments: HH therapy and Home excercise program 12. Overall Rehab/Functional Prognosis: good  RECOMMENDATIONS: This patient's condition is appropriate for continued rehabilitative care in the following setting: Will need a more thorough evaluation by therapies, but pt will likely benefit from a short CIR stay to optimize independence prior to discharge.  Patient has agreed to participate in recommended program. Yes Note that insurance prior authorization may be required for reimbursement for recommended care.  Comment: Rehab Admissions Coordinator to follow up.  Maryla Morrow, MD 01/21/2016       Revision History  Date/Time User Provider Type Action   01/21/2016 11:48 AM Ankit Karis JubaAnil Patel, MD Physician Sign   01/21/2016 6:48 AM Charlton Amoraniel J Angiulli, PA-C Physician Assistant [facility]   View Details Report

## 2016-01-23 NOTE — Discharge Instructions (Signed)
You were admitted with rhabdomyolysis. You will be discharged to rehab. Please follow up with your primary care doctor after being discharged.  Phantom Limb Pain Phantom limb pain occurs in an arm or leg following an amputation. It is pain in an extremity that no longer exists. This pain varies with different patients. Different activities may cause the pain. Some people with an amputated limb experience the opposite of phantom pain, which is phantom pleasure.  The trouble may start in a part of the brain known as the sensory cortex. The sensory cortex is the portion of your brain that processes sensations from the rest of your body. It is hypothesized that when a body part is lost, the corresponding part of the brain is not able to handle the loss and rewires its circuitry to make up for the signals it no longer receives from the missing extremity. The exact mechanism of how phantom limb pain occurs is not known. The severity of pain seems to be correlated with personal problems such as stress and attitude. It also seems to correlate with the amount of pain a person had before the operation. CAUSES   Damaged nerve endings.  Scar tissue at the amputation site. TREATMENT  Phantom limb pain can be severe and debilitating. Most cases of phantom limb pain only last briefly. There are a number of different therapies and medications that may give relief. Keep working with your health care provider until relief is obtained. Some treatments that may be helpful include:  Hypnosis and mental imagery. Their techniques can give patients the needed impetus to recognize their ability to regain control.  Biofeedback.  Relaxation techniques. They are related to hypnosis techniques and use the mind and body to control pain.  Acupuncture.  Massage.  Exercise.  Antidepressant medicine.  Anticonvulsant medicine.  Narcotics or pain medicines. SEEK MEDICAL CARE IF: Pain is not relieved or increases.     This information is not intended to replace advice given to you by your health care provider. Make sure you discuss any questions you have with your health care provider.   Document Released: 09/27/2002 Document Revised: 03/09/2013 Document Reviewed: 12/07/2012 Elsevier Interactive Patient Education [employer]2016 [employer] Inc.

## 2016-01-23 NOTE — Interval H&P Note (Signed)
Jeffery Gross was admitted today to Inpatient Rehabilitation with the diagnosis of bilateral AKA.  The patient's history has been reviewed, patient examined, and there is no change in status.  Patient continues to be appropriate for intensive inpatient rehabilitation.  I have reviewed the patient's chart and labs.  Questions were answered to the patient's satisfaction. The PAPE has been reviewed and assessment remains appropriate.  Jeffery Gross Jeffery Gross 01/23/2016, 9:19 PM

## 2016-01-23 NOTE — Progress Notes (Signed)
Occupational Therapy Treatment Patient Details Name: Jeffery Gross MRN: [id number] DOB: [birth year] Today's Date: 01/23/2016    History of present illness Jeffery Gross is a 55 y.o. 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 PMH is significant for chronic back pain, back surgery, HTN, depression and anxiety.    OT comments  Pt performed bed level bathing and dressing and seated grooming at sink. Eager to go to inpatient rehab.  Follow Up Recommendations  CIR    Equipment Recommendations  Wheelchair (measurements OT);Wheelchair cushion (measurements OT);Tub/shower bench (drop arm commode)    Recommendations for Other Services      Precautions / Restrictions Precautions Precautions: Fall Restrictions Weight Bearing Restrictions: Yes RLE Weight Bearing: Non weight bearing LLE Weight Bearing: Non weight bearing       Mobility Bed Mobility Overal bed mobility: Modified Independent             General bed mobility comments: supine to long sit with bed rails  Transfers Overall transfer level: Needs assistance Equipment used:  (w/c) Transfers: Lateral/Scoot Transfers          Lateral/Scoot Transfers: Min assist General transfer comment: verbal cues for set up of w/c, use of brakes and to steady w/c    Balance     Sitting balance-Leahy Scale: Good                             ADL Overall ADL's : Needs assistance/impaired     Grooming: Oral care;Wash/dry hands;Wash/dry face;Sitting;Set up (at sink)   Upper Body Bathing: Minimal assitance;Sitting Upper Body Bathing Details (indicate cue type and reason): assisted with back Lower Body Bathing: Set up;Sitting/lateral leans   Upper Body Dressing : Set up;Sitting                   Functional mobility during  ADLs: Supervision/safety;Wheelchair General ADL Comments: Pt sat in w/c at sink to perform grooming.      Vision                     Perception     Praxis      Cognition   Behavior During Therapy: WFL for tasks assessed/performed Overall Cognitive Status: Within Functional Limits for tasks assessed                       Extremity/Trunk Assessment               Exercises     Shoulder Instructions       General Comments      Pertinent Vitals/ Pain       Pain Assessment: Faces Faces Pain Scale: No hurt  Home Living                                          Prior Functioning/Environment              Frequency Min 2X/week     Progress Toward Goals  OT Goals(current goals can now be found in the care plan section)  Progress towards OT goals: Progressing toward goals  Acute Rehab OT Goals Time For Goal Achievement: 01/24/16 Potential  to Achieve Goals: Good  Plan Discharge plan remains appropriate    Co-evaluation                 End of Session     Activity Tolerance Patient tolerated treatment well   Patient Left  (in w/c with CIR PA)   Nurse Communication          Time: 1610-9604: 1119-1149 OT Time Calculation (min): 30 min  Charges: OT General Charges $OT Visit: 1 Procedure OT Treatments $Self Care/Home Management : 23-37 mins  Evern BioMayberry, Salih [name] 01/23/2016, 11:57 AM  [phone]

## 2016-01-24 ENCOUNTER — Inpatient Hospital Stay (HOSPITAL_COMMUNITY): Payer: BLUE CROSS/BLUE SHIELD | Admitting: Occupational Therapy

## 2016-01-24 ENCOUNTER — Inpatient Hospital Stay (HOSPITAL_COMMUNITY): Payer: BLUE CROSS/BLUE SHIELD | Admitting: Physical Therapy

## 2016-01-24 DIAGNOSIS — Z89611 Acquired absence of right leg above knee: Secondary | ICD-10-CM

## 2016-01-24 DIAGNOSIS — Z89612 Acquired absence of left leg above knee: Secondary | ICD-10-CM

## 2016-01-24 DIAGNOSIS — N179 Acute kidney failure, unspecified: Secondary | ICD-10-CM

## 2016-01-24 DIAGNOSIS — M6282 Rhabdomyolysis: Secondary | ICD-10-CM

## 2016-01-24 LAB — COMPREHENSIVE METABOLIC PANEL
ALBUMIN: 2.8 g/dL — AB (ref 3.5–5.0)
ALT: 18 U/L (ref 17–63)
ANION GAP: 10 (ref 5–15)
AST: 25 U/L (ref 15–41)
Alkaline Phosphatase: 87 U/L (ref 38–126)
BUN: 69 mg/dL — AB (ref 6–20)
CHLORIDE: 102 mmol/L (ref 101–111)
CO2: 28 mmol/L (ref 22–32)
Calcium: 9.4 mg/dL (ref 8.9–10.3)
Creatinine, Ser: 3.36 mg/dL — ABNORMAL HIGH (ref 0.61–1.24)
GFR calc Af Amer: 22 mL/min — ABNORMAL LOW (ref >60)
GFR calc non Af Amer: 19 mL/min — ABNORMAL LOW (ref >60)
GLUCOSE: 98 mg/dL (ref 65–99)
POTASSIUM: 4.1 mmol/L (ref 3.5–5.1)
SODIUM: 140 mmol/L (ref 135–145)
TOTAL PROTEIN: 6.1 g/dL — AB (ref 6.5–8.1)
Total Bilirubin: 0.3 mg/dL (ref 0.3–1.2)

## 2016-01-24 LAB — URINALYSIS, ROUTINE W REFLEX MICROSCOPIC
BILIRUBIN URINE: NEGATIVE
GLUCOSE, UA: NEGATIVE mg/dL
Hgb urine dipstick: NEGATIVE
KETONES UR: NEGATIVE mg/dL
LEUKOCYTES UA: NEGATIVE
Nitrite: NEGATIVE
PROTEIN: NEGATIVE mg/dL
Specific Gravity, Urine: 1.015 (ref 1.005–1.030)
pH: 5.5 (ref 5.0–8.0)

## 2016-01-24 LAB — CBC WITH DIFFERENTIAL/PLATELET
BASOS ABS: 0 10*3/uL (ref 0.0–0.1)
BASOS PCT: 1 %
EOS ABS: 0.8 10*3/uL — AB (ref 0.0–0.7)
EOS PCT: 11 %
HCT: 32 % — ABNORMAL LOW (ref 39.0–52.0)
Hemoglobin: 10.2 g/dL — ABNORMAL LOW (ref 13.0–17.0)
Lymphocytes Relative: 31 %
Lymphs Abs: 2.2 10*3/uL (ref 0.7–4.0)
MCH: 27.9 pg (ref 26.0–34.0)
MCHC: 31.9 g/dL (ref 30.0–36.0)
MCV: 87.7 fL (ref 78.0–100.0)
MONO ABS: 0.7 10*3/uL (ref 0.1–1.0)
MONOS PCT: 10 %
NEUTROS ABS: 3.3 10*3/uL (ref 1.7–7.7)
Neutrophils Relative %: 47 %
PLATELETS: 240 10*3/uL (ref 150–400)
RBC: 3.65 MIL/uL — ABNORMAL LOW (ref 4.22–5.81)
RDW: 13.8 % (ref 11.5–15.5)
WBC: 6.9 10*3/uL (ref 4.0–10.5)

## 2016-01-24 MED ORDER — PREGABALIN 25 MG PO CAPS
50.0000 mg | ORAL_CAPSULE | Freq: Two times a day (BID) | ORAL | Status: DC
  Administered 2016-01-24 – 2016-01-30 (×12): 50 mg via ORAL
  Filled 2016-01-24 (×13): qty 2

## 2016-01-24 NOTE — Progress Notes (Signed)
Occupational Therapy Session Note  Patient Details  Name: Jeffery Gross MRN: [id number] Date of Birth: 1961/04/12  Today's Date: 01/24/2016 OT Individual Time: 1430-1530 OT Individual Time Calculation (min): 60 min    Short Term Goals: Week 1:  STGs=LTGs secondary to estimated short LOS  Skilled Therapeutic Interventions/Progress Updates:  Upon entering the room, pt supine in bed sleeping. Upon waking, pt reports 8/10 c/o pain in B residual limbs. RN notified and bringing medications to the room this session. Pt performed lateral scoot transfer from bed >wheelchair with OT steadying equipment for safety. Pt propelled wheelchair with supervision to ADL apartment. OT and pt discussed needing measurements of home in order to ensure appropriate discharge planning and needed equipment. Pt provided with home measurement sheet. Pt performed lateral scoot transfer from wheelchair <> bed with steadying of equipment. Pt performing bed mobility at supervision level for supine <>sit and rolling L <> R. Pt returned to wheelchair and performed similar transfer onto soft, plush sofa. Pt setting up wheelchair and transfer with increased time and no cues given by therapist for set up. Pt returning to room at end of session with call bell and all needed items within reach upon exiting the room.   Therapy Documentation Precautions:  Precautions Precautions: Fall Restrictions Weight Bearing Restrictions: Yes RLE Weight Bearing: Non weight bearing LLE Weight Bearing: Non weight bearing General: General Chart Reviewed: Yes Family/Caregiver Present: No Vital Signs: Therapy Vitals Temp: 97.8 F (36.6 C) Temp Source: Oral Pulse Rate: 79 Resp: 18 BP: 118/68 mmHg Patient Position (if appropriate): Lying Oxygen Therapy SpO2: 100 % O2 Device: Not Delivered Pain: Pain Assessment Pain Assessment: 0-10 Pain Score: 5  Pain Location: Leg Pain Orientation: Left;Right Pain Descriptors / Indicators:  Aching Patients Stated Pain Goal: 4 Pain Intervention(s): Medication (See eMAR)  See Function Navigator for Current Functional Status.   Therapy/Group: Individual Therapy  Lowella Gripittman, [name] Conde L 01/24/2016, 5:15 PM

## 2016-01-24 NOTE — Progress Notes (Signed)
Patient information reviewed and entered into eRehab system by Delsie Amador, RN, CRRN, PPS Coordinator.  Information including medical coding and functional independence measure will be reviewed and updated through discharge.    

## 2016-01-24 NOTE — Progress Notes (Addendum)
Occupational Therapy Assessment and Plan  Patient Details  Name: Jeffery Gross MRN: [id number] Date of Birth: [birth year]  OT Diagnosis: acute pain, muscle weakness (generalized) and paraplegia at level  Rehab Potential: Rehab Potential (ACUTE ONLY): Good ELOS: 5-7 days   Treatment Time: 1000-1101 61 minutes   Problem List:  Patient Active Problem List   Diagnosis Date Noted  . Ischemia of lower extremity 01/23/2016  . Hypoalbuminemia due to protein-calorie malnutrition ([place])   . Dysuria   . Tobacco abuse   . Constipation due to pain medication   . Chronic low back pain   . Benign essential HTN   . Asthma   . Hyponatremia   . Acute blood loss anemia   . CKD (chronic kidney disease)   . Anemia of chronic disease   . Transaminitis   . Phantom limb syndrome with pain ([place])   . S/P AKA (above knee amputation) bilateral ([place])   . Acute kidney injury ([place])   . Syncope   . Pressure ulcer 01/12/2016  . Fall   . Encounter for central line placement   . Urinary retention   . AKI (acute kidney injury) ([place])   . Traumatic rhabdomyolysis ([place])   . Rhabdomyolysis 01/03/2016  . Nontraumatic compartment syndrome of leg 01/03/2016  . Chest pain 10/27/2012  . HTN (hypertension) 10/27/2012  . Back pain 10/27/2012    Past Medical History:  Past Medical History  Diagnosis Date  . Chest pain   . DDD (degenerative disc disease), cervical   . Back pain   . HTN (hypertension)   . Depression    Past Surgical History:  Past Surgical History  Procedure Laterality Date  . Vasectomy    . Fasciotomy Bilateral 01/03/2016    Procedure: FASCIOTOMY;  Surgeon: Leandrew Koyanagi, MD;  Location: [place];  Service: Orthopedics;  Laterality: Bilateral;  . I&d extremity Bilateral 01/05/2016    Procedure: IRRIGATION AND DEBRIDEMENT BILATERAL LOWER EXTREMITIES; WOUND VAC CHANGE;  Surgeon: Leandrew Koyanagi, MD;  Location: [place];  Service: Orthopedics;  Laterality: Bilateral;  . I&d extremity Left 01/07/2016     Procedure: Irrigation and Debridement of Left Lower Extremity with Application of Wound Vac;  Surgeon: Leandrew Koyanagi, MD;  Location: [place] [facility];  Service: Orthopedics;  Laterality: Left;  . Amputation Bilateral 01/09/2016    Procedure: Bilateral Above Knee Amputation, Apply Wound VAC;  Surgeon: Newt Minion, MD;  Location: [place];  Service: Orthopedics;  Laterality: Bilateral;    Assessment & Plan Clinical Impression:  Jeffery Gross is a 41-y.o. male with PMH of DDD, chronic back pain, anxiety with depression, HTN who was admitted on 01/03/16 after fall due to presumed syncope and was on the floor for 2-3 days with inability to walk. He was found to have rhabdomyolysis with acute renal failure, BLE DVT, and muscle necrosis of RLE with bilateral compartment syndrome. He was started on dialysis for nonoliguric AKI due to ATN and nephrology has been following for input. He has required fasciotomy with multiple I & D procedures due to attempts at limb salvage. He underewent B-AKA on 01/09/16 by Dr. Sharol Given. ABLA managed with transfusion and IV iron to supllement low iron stores. Right IJ Tunneled HD cath placed on 06/30 by IVR/Dr. Vernard Gambles. He is showing some signs of renal recovery with improvement in UOP and HD held since 07/01.   Patient currently requires close supervision-Min guard with ADL completion secondary to muscle weakness and decreased balance strategies.  Prior to hospitalization,  patient could complete BADLs with complete independence.   Patient will benefit from skilled intervention to increase independence with basic self-care skills prior to discharge home with care partner.  Anticipate patient will require intermittent supervision and follow up home health.  OT - End of Session Activity Tolerance: Tolerates 30+ min activity without fatigue Endurance Deficit: No OT Assessment Rehab Potential (ACUTE ONLY): Good Barriers to Discharge: Inaccessible home environment;Decreased caregiver  support OT Patient demonstrates impairments in the following area(s): Pain;Safety;Balance OT Basic ADL's Functional Problem(s): Grooming;Bathing;Dressing;Toileting OT Advanced ADL's Functional Problem(s): Simple Meal Preparation;Laundry;Light Housekeeping OT Transfers Functional Problem(s): Toilet;Tub/Shower OT Plan OT Intensity: Minimum of 1-2 x/day, 45 to 90 minutes OT Frequency: 5 out of 7 days OT Duration/Estimated Length of Stay: 5-7 days OT Treatment/Interventions: Balance/vestibular training;Discharge planning;DME/adaptive equipment instruction;Functional mobility training;Neuromuscular re-education;Pain management;Patient/family education;Psychosocial support;Self Care/advanced ADL retraining;Therapeutic Activities;Therapeutic Exercise;UE/LE Strength taining/ROM;UE/LE Coordination activities;Wheelchair propulsion/positioning OT Recommendation Patient destination: Home Follow Up Recommendations: Home health OT Equipment Recommended: To be determined   Skilled Therapeutic Intervention Upon skilled OT arrival for evaluation, pt reported having already bathed and dressed this morning. Pt was agreeable to participate in simulated ADL session at EOB. Pt was able to complete pericare and LB dressing with lateral leaning technique and close supervision for safety with dynamic sitting balance. Pt doffed/donned shirt with supervision. Pt completed anterior-posterior transfer with close supervision to w/c for completing toilet transfer. Pt completed toilet transfer using anterior scooting and additional method of lateral scooting with min guard and instruction on proper technique and w/c placement. Pt completed grooming and oral care at sink with supervision and setup. Pt was provided education on desentization of LEs and use of room booklet for receiving further education regarding management of amputations. At end of session, pt returned to bed with all needs within reach and instruction on notifying  nursing staff for basic needs and completing functional transfers. Pt verbalized understanding.   OT Evaluation Precautions/Restrictions  Precautions Precautions: Fall Restrictions RLE Weight Bearing: Non weight bearing LLE Weight Bearing: Non weight bearing General Chart Reviewed: Yes Family/Caregiver Present: No Vital Signs   Pain Pain Assessment Pain Assessment: 0-10 Pain Score: 5  Pain Location: Leg Pain Orientation: Left;Right Pain Descriptors / Indicators: Aching Patients Stated Pain Goal: 4 Pain Intervention(s): Medication (See eMAR) Home Living/Prior [place] expects to be discharged to:: Private residence Living Arrangements: Non-relatives/Friends Available Help at Discharge: Available PRN/intermittently Type of Home: House Home Access: Stairs to enter Technical brewer of Steps: 5-6STE Home Layout: One level Bathroom Shower/Tub: Chiropodist: Standard Bathroom Accessibility: Yes Additional Comments: patient plans to D/c to ex wife's house with plans to build ramp.   Lives With: Family, Friend(s) IADL History Occupation: Part time employment Type of Occupation: Development worker, community  Leisure and Hobbies: Insurance underwriter, golfing, Ambulance person  Prior Function Level of Independence: Independent with basic ADLs, Independent with homemaking with ambulation, Independent with gait, Independent with transfers  Able to [place]?: Yes Driving: Yes Vocation: Full time employment Vocation Requirements: Landscaping plumbing ADL   Vision/Perception  Vision- History Baseline Vision/History: Wears glasses Wears Glasses: Reading only Patient Visual Report: No change from baseline Vision- Assessment Vision Assessment?: No apparent visual deficits  Cognition Overall Cognitive Status: Within Functional Limits for tasks assessed Orientation Level: Person;Situation;Place Person: Oriented Place: Oriented Situation:  Oriented Year: 2017 Month: July Day of Week: Correct Memory: Appears intact Immediate Memory Recall: Sock;Blue;Bed Memory Recall: Sock;Blue;Bed Memory Recall Sock: Without Cue Memory Recall Blue: Without Cue Memory Recall Bed: Without Cue  Attention: Sustained Sustained Attention: Appears intact Awareness: Appears intact Problem Solving: Appears intact Executive Function: Sequencing;Organizing;Decision Making;Initiating Reasoning: Appears intact Sequencing: Appears intact Organizing: Appears intact Decision Making: Appears intact Initiating: Appears intact Behaviors: Impulsive Safety/Judgment: Appears intact Sensation Sensation Light Touch: Appears Intact Stereognosis: Appears Intact Hot/Cold: Appears Intact Proprioception: Appears Intact Coordination Gross Motor Movements are Fluid and Coordinated: Yes Fine Motor Movements are Fluid and Coordinated: Yes Motor  Motor Motor: Within Functional Limits Mobility     Trunk/Postural Assessment  Cervical Assessment Cervical Assessment: Within Functional Limits Thoracic Assessment Thoracic Assessment: Within Functional Limits Lumbar Assessment Lumbar Assessment: Within Functional Limits Postural Control Postural Control: Within Functional Limits  Balance Balance Balance Assessed: Yes Static Sitting Balance Static Sitting - Level of Assistance: 6: Modified independent (Device/Increase time) Dynamic Sitting Balance Dynamic Sitting - Balance Support: Right upper extremity supported (during ADLs) Dynamic Sitting - Level of Assistance: 4: Min assist (Min guard for safety during functional transfers) Extremity/Trunk Assessment RUE Assessment RUE Assessment: Within Functional Limits LUE Assessment LUE Assessment: Within Functional Limits   See Function Navigator for Current Functional Status.   Refer to Care Plan for Long Term Goals  Recommendations for other services: None  Discharge Criteria: Patient will be  discharged from OT if patient refuses treatment 3 consecutive times without medical reason, if treatment goals not met, if there is a change in medical status, if patient makes no progress towards goals or if patient is discharged from hospital.  The above assessment, treatment plan, treatment alternatives and goals were discussed and mutually agreed upon: by patient  Skeet Simmer 01/24/2016, 5:51 PM

## 2016-01-24 NOTE — IPOC Note (Addendum)
Overall Plan of Care [facility](IPOC) Patient Details Name: Jeffery Gross MRN: [id number] DOB: 10-18-1960  Admitting Diagnosis: AKA  Hospital Problems: Principal Problem:   S/P AKA (above knee amputation) bilateral (H[facility]) Active Problems:   Rhabdomyolysis   AKI (acute kidney injury) (H[facility])   Phantom limb syndrome with pain ([facility])     Functional Problem List: Nursing Skin Integrity, Pain, Bowel, Endurance, Edema, Medication Management, Motor, Safety  PT Balance, Pain, Safety, Sensory, Skin Integrity  OT Pain, Safety, Balance  SLP    TR         Basic ADL's: OT Grooming, Bathing, Dressing, Toileting     Advanced  ADL's: OT Simple Meal Preparation, Laundry, Light Housekeeping     Transfers: PT Bed Mobility, Bed to Chair, Car, State Street CorporationFurniture, Floor  OT Toilet, Research scientist (life sciences)Tub/Shower     Locomotion: PT Psychologist, prison and probation servicesWheelchair Mobility, Other (comment)     Additional Impairments: OT    SLP        TR      Anticipated Outcomes Item Anticipated Outcome  Self Feeding  mod I   Swallowing      Basic self-care   mod I   Toileting   mod I    Bathroom Transfers  mod I  Bowel/Bladder  Pt will remain continent of bowel and bladder with min assist   Transfers  Mod I lateral scoot or AP.   Locomotion  Mod I in WC.   Communication     Cognition     Pain  Pt will manage pain at 5 or less on a scale of 0-10.   Safety/Judgment  Pt will remain free of falls and injury with min assist    Therapy Plan: PT Intensity: Minimum of 1-2 x/day ,45 to 90 minutes PT Frequency: 5 out of 7 days PT Duration Estimated Length of Stay: 5-7 days  OT Intensity: Minimum of 1-2 x/day, 45 to 90 minutes OT Frequency: 5 out of 7 days OT Duration/Estimated Length of Stay: 5-7 days         Team Interventions: Nursing Interventions Pain Management, Patient/Family Education, Bowel Management, Disease Management/Prevention, Medication Management, Skin Care/Wound Management, Discharge Planning  PT interventions Balance/vestibular  training, Community reintegration, Discharge planning, Disease management/prevention, DME/adaptive equipment instruction, Functional electrical stimulation, Functional mobility training, Neuromuscular re-education, Pain management, Patient/family education, Psychosocial support, Skin care/wound management, Splinting/orthotics, Stair training, Therapeutic Activities, Therapeutic Exercise, UE/LE Strength taining/ROM, UE/LE Coordination activities, Visual/perceptual remediation/compensation, Wheelchair propulsion/positioning, Cognitive remediation/compensation  OT Interventions Warden/rangerBalance/vestibular training, Discharge planning, DME/adaptive equipment instruction, Functional mobility training, Neuromuscular re-education, Pain management, Patient/family education, Psychosocial support, Self Care/advanced ADL retraining, Therapeutic Activities, Therapeutic Exercise, UE/LE Strength taining/ROM, UE/LE Coordination activities, Wheelchair propulsion/positioning  SLP Interventions    TR Interventions    SW/CM Interventions Discharge Planning, Psychosocial Support, Patient/Family Education    Team Discharge Planning: Destination: PT-Home ,OT- Home , SLP-  Projected Follow-up: PT-Home health PT, OT-  Home health OT, SLP-  Projected Equipment Needs: PT-Wheelchair (measurements), Wheelchair cushion (measurements), OT- To be determined, SLP-  Equipment Details: PT- , OT-  Patient/family involved in discharge planning: PT- Patient,  OT-Patient, SLP-   MD ELOS: 5-7 days Medical Rehab Prognosis:  Excellent Assessment: The patient has been admitted for CIR therapies with the diagnosis of bilateral AKA's. The team will be addressing functional mobility, strength, stamina, balance, safety, adaptive techniques and equipment, self-care, bowel and bladder mgt, patient and caregiver education, pain mgt, coping skills, stump mgt, pre-prosthetic education, community reintegration. Goals have been set at mod I for mobility and  self-care.    Ranelle OysterZachary T. Swartz, MD, FAAPMR      See Team Conference Notes for weekly updates to the plan of care

## 2016-01-24 NOTE — Progress Notes (Signed)
At 2000 requesting robaxin, paged Jesusita Okaan, [place], to obtain order. Med given at 2010 without further complaint of pain. Alfredo MartinezMurray, [place] Segler A

## 2016-01-24 NOTE — Evaluation (Addendum)
Physical Therapy Assessment and Plan  Patient Details  Name: Jeffery Gross MRN: [id number] Date of Birth: 11/28/1960  PT Diagnosis: Muscle weakness and decreased mobility secondary to bilateral AKA.  Rehab Potential: Excellent ELOS: 5-7 days    Today's Date: 01/24/2016 PT Individual Time: 0800-0915 PT Individual Time Calculation (min): 75 min    Problem List:  Patient Active Problem List   Diagnosis Date Noted  . Ischemia of lower extremity 01/23/2016  . Hypoalbuminemia due to protein-calorie malnutrition (HCC)   . Dysuria   . Tobacco abuse   . Constipation due to pain medication   . Chronic low back pain   . Benign essential HTN   . Asthma   . Hyponatremia   . Acute blood loss anemia   . CKD (chronic kidney disease)   . Anemia of chronic disease   . Transaminitis   . Phantom limb syndrome with pain (HCC)   . S/P AKA (above knee amputation) bilateral (HCC)   . Acute kidney injury (HCC)   . Syncope   . Pressure ulcer 01/12/2016  . Fall   . Encounter for central line placement   . Urinary retention   . AKI (acute kidney injury) (HCC)   . Traumatic rhabdomyolysis (HCC)   . Rhabdomyolysis 01/03/2016  . Nontraumatic compartment syndrome of leg 01/03/2016  . Chest pain 10/27/2012  . HTN (hypertension) 10/27/2012  . Back pain 10/27/2012    Past Medical History:  Past Medical History  Diagnosis Date  . Chest pain   . DDD (degenerative disc disease), cervical   . Back pain   . HTN (hypertension)   . Depression    Past Surgical History:  Past Surgical History  Procedure Laterality Date  . Vasectomy    . Fasciotomy Bilateral 01/03/2016    Procedure: FASCIOTOMY;  Surgeon: Tarry Kos, MD;  Location: [facility];  Service: Orthopedics;  Laterality: Bilateral;  . I&d extremity Bilateral 01/05/2016    Procedure: IRRIGATION AND DEBRIDEMENT BILATERAL LOWER EXTREMITIES; WOUND VAC CHANGE;  Surgeon: Tarry Kos, MD;  Location: [facility] OR;  Service: Orthopedics;  Laterality: Bilateral;   . I&d extremity Left 01/07/2016    Procedure: Irrigation and Debridement of Left Lower Extremity with Application of Wound Vac;  Surgeon: Tarry Kos, MD;  Location: [facility];  Service: Orthopedics;  Laterality: Left;  . Amputation Bilateral 01/09/2016    Procedure: Bilateral Above Knee Amputation, Apply Wound VAC;  Surgeon: Nadara Mustard, MD;  Location: [facility];  Service: Orthopedics;  Laterality: Bilateral;    Assessment & Plan Clinical Impression: Patient is a 54-y.o. male with PMH of DDD, chronic back pain, anxiety with depression, HTN who was admitted on 01/03/16 after fall due to presumed syncope and was on the floor for 2-3 days with inability to walk. He was found to have rhabdomyolysis with acute renal failure, BLE DVT, and muscle necrosis of RLE with bilateral compartment syndrome. He was started on dialysis for nonoliguric AKI due to ATN and nephrology has been following for input. He has required fasciotomy with multiple I & D procedures due to attempts at limb salvage. He underewent B-AKA on 01/09/16 by Dr. Lajoyce Corners. ABLA managed with transfusion and IV iron to supllement low iron stores. Right IJ Tunneled HD cath placed on 06/30 by IVR/Dr. Deanne Coffer. He is showing some signs of renal recovery with improvement in UOP and HD held since 07/01.  Patient transferred to CIR on 01/23/2016 .   Patient currently requires min with mobility secondary to  muscle weakness and muscle joint tightness.  Prior to hospitalization, patient was independent  with mobility and lived with Family, Friend(s) in a House home.  Home access is  Ramped entrance (ramp not built at admission. ).  Patient will benefit from skilled PT intervention to maximize safe functional mobility, minimize fall risk and decrease caregiver burden for planned discharge home with intermittent assist.  Anticipate patient will benefit from follow up [facility] at discharge.  PT - End of Session Activity Tolerance: Tolerates 30+ min activity without  fatigue PT Assessment Rehab Potential (ACUTE/IP ONLY): Excellent Barriers to Discharge: Inaccessible home environment;Decreased caregiver support PT Patient demonstrates impairments in the following area(s): Balance;Pain;Safety;Sensory;Skin Integrity PT Transfers Functional Problem(s): Bed Mobility;Bed to Chair;Car;Furniture;Floor PT Locomotion Functional Problem(s): Wheelchair Mobility;Other (comment) PT Plan PT Intensity: Minimum of 1-2 x/day ,45 to 90 minutes PT Frequency: 5 out of 7 days PT Duration Estimated Length of Stay: 5-7 days  PT Treatment/Interventions: Balance/vestibular training;Community reintegration;Discharge planning;Disease management/prevention;DME/adaptive equipment instruction;Functional electrical stimulation;Functional mobility training;Neuromuscular re-education;Pain management;Patient/family education;Psychosocial support;Skin care/wound management;Splinting/orthotics;Stair training;Therapeutic Activities;Therapeutic Exercise;UE/LE Strength taining/ROM;UE/LE Coordination activities;Visual/perceptual remediation/compensation;Wheelchair propulsion/positioning;Cognitive remediation/compensation PT Transfers Anticipated Outcome(s): Mod I lateral scoot or AP.  PT Locomotion Anticipated Outcome(s): Mod I in [place].  PT Recommendation Follow Up Recommendations: Home health PT Patient destination: Home Equipment Recommended: Wheelchair (measurements);Wheelchair cushion (measurements)  Skilled Therapeutic Intervention Patient received supine in bed and agreeable to PT. PT performed evaluation and initiated treatment intervention; see below for results. PT instructed patient in Lateral scoot transfers with supervision-min guard without SB. PT instructed patient in AP transfer with supervision A for Mat>Wc and Min A for WC>mat. For all transfers PT provided min cues for Head/hips relationship. WC mobility performed for up to 329f x 4 with supervision A from PT with min cues for WC  management in tight spaces. PT also instructed patient in [place] up and down ramp x 15 ft. Throughout WC management min cues provided to maintain erect posture to prevent tipping of WC.  Car transfer with SB with min A from PT and mod cues for improved hips head relationship as well as improved UE positioning.   Patient left supine in bed at end of session with call bell within reach.   PT Evaluation Precautions/Restrictions Precautions Precautions: Fall Restrictions RLE Weight Bearing: Non weight bearing LLE Weight Bearing: Non weight bearing General Chart Reviewed: Yes Additional Pertinent History: DDD Family/Caregiver Present: No Vital Signs  Pain Pain Assessment Pain Assessment: 0-10 Pain Score: 7  Pain Location: Leg Pain Orientation: Right;Left Pain Descriptors / Indicators: Aching;Dull Patients Stated Pain Goal: 4 Pain Intervention(s): Ambulation/increased activity Home Living/Prior Functioning Home Living Available Help at Discharge: Available PRN/intermittently Type of Home: House Home Access: Ramped entrance (ramp not built at admission. ) Home Layout: One level Bathroom Shower/Tub: TChiropodist Standard Bathroom Accessibility: Yes Additional Comments: patient plans to D/c to ex wife's house with plans to build ramp.   Lives With: Family;Friend(s) Prior Function Level of Independence: Independent with basic ADLs;Independent with homemaking with ambulation;Independent with gait;Independent with transfers  Able to Take Stairs?: Yes Driving: Yes Vocation: Full time employment Vocation Requirements: Landscaping plumbing Vision/Perception     Cognition Orientation Level: Oriented X4 Memory: Appears intact Awareness: Appears intact Problem Solving: Appears intact Safety/Judgment: Impaired Sensation Sensation Light Touch: Appears Intact Hot/Cold: Appears Intact Additional Comments: Patient reports intermintent phantom sensation and  phantom pain.  Coordination Gross Motor Movements are Fluid and Coordinated: Yes Motor  Motor Motor: Within Functional Limits  Mobility Bed Mobility  Bed Mobility: Rolling Right;Rolling Left;Supine to Sit;Sitting - Scoot to [employer] of Bed;Sit to Supine Rolling Right: 5: Supervision Rolling Right Details: Verbal cues for technique Rolling Left: 5: Supervision Rolling Left Details: Verbal cues for technique Supine to Sit: 5: Supervision Supine to Sit Details: Verbal cues for technique;Verbal cues for precautions/safety Sitting - Scoot to Edge of Bed: 5: Supervision Sitting - Scoot to Edge of Bed Details: Verbal cues for technique Sit to Supine: 5: Supervision Sit to Supine - Details: Verbal cues for technique Transfers Transfers: Yes Anterior-Posterior Transfer: 4: Min guard Lateral/Scoot Transfers: 5: Supervision Lateral/Scoot Transfer Details: Verbal cues for technique;Verbal cues for precautions/safety;Tactile cues for placement Locomotion  Ambulation Ambulation: No Gait Gait: No Stairs / Additional Locomotion Ramp: 4: Min assist (with WC) Wheelchair Mobility Wheelchair Mobility: Yes Wheelchair Assistance: 5: Supervision Wheelchair Assistance Details: Verbal cues for technique;Verbal cues for Information systems manager: Both upper extremities Wheelchair Parts Management: Supervision/cueing Distance: 330f  Trunk/Postural Assessment  Cervical Assessment Cervical Assessment: Within Functional Limits Thoracic Assessment Thoracic Assessment: Within Functional Limits Lumbar Assessment Lumbar Assessment: Within Functional Limits (decreased lordosis ) Postural Control Postural Control: Within Functional Limits  Balance Balance Balance Assessed: Yes Static Sitting Balance Static Sitting - Level of Assistance: 6: Modified independent (Device/Increase time) Dynamic Sitting Balance Dynamic Sitting - Balance Support: Right upper extremity supported Dynamic Sitting  - Level of Assistance: 5: Stand by assistance Sitting balance - Comments: mild posterior LOB following lateral scoot transfers to surface without rails.   Extremity Assessment      RLE Assessment RLE Assessment: Exceptions to WSycam[facility] Medical CenterRLE AROM (degrees) RLE Overall AROM Comments: Hip ROM WNL. able to achieve slight hip extension past neutral.  RLE Strength RLE Overall Strength: Deficits (4/5 hip flexion/abduction secondary to pain. ) LLE Assessment LLE Assessment: Exceptions to WFL LLE AROM (degrees) LLE Overall AROM Comments: Hip ROM WFL.  LLE Strength LLE Overall Strength: Deficits (4/5 hip flexion/abduction secondary to pain. )   See Function Navigator for Current Functional Status.   Refer to Care Plan for Long Term Goals  Recommendations for other services: None  Discharge Criteria: Patient will be discharged from PT if patient refuses treatment 3 consecutive times without medical reason, if treatment goals not met, if there is a change in medical status, if patient makes no progress towards goals or if patient is discharged from hospital.  The above assessment, treatment plan, treatment alternatives and goals were discussed and mutually agreed upon: by patient  ALorie Phenix7/12/2015, 10:30 AM

## 2016-01-24 NOTE — Care Management Note (Signed)
[facility] Individual Statement of Services  Patient Name:  Jeffery HarbourScott D Damaso  Date:  01/24/2016  Welcome to the [facility].  Our goal is to provide you with an individualized program based on your diagnosis and situation, designed to meet your specific needs.  With this comprehensive rehabilitation program, you will be expected to participate in at least 3 hours of rehabilitation therapies Monday-Friday, with modified therapy programming on the weekends.  Your rehabilitation program will include the following services:  Physical Therapy (PT), Occupational Therapy (OT), 24 hour per day rehabilitation nursing, Therapeutic Recreaction (TR), Neuropsychology, Case Management (Social Worker), Rehabilitation Medicine, Nutrition Services and Pharmacy Services  Weekly team conferences will be held on Wednesday to discuss your progress.  Your Social Worker will talk with you frequently to get your input and to update you on team discussions.  Team conferences with you and your family in attendance may also be held.  Expected length of stay: 7 days Overall anticipated outcome: mod/i wheelchair level  Depending on your progress and recovery, your program may change. Your Social Worker will coordinate services and will keep you informed of any changes. Your Social Worker's name and contact numbers are listed  below.  The following services may also be recommended but are not provided by the [facility]:   Driving Evaluations  Home Health Rehabiltiation Services  Outpatient Rehabilitation Services  Vocational Rehabilitation   Arrangements will be made to provide these services after discharge if needed.  Arrangements include referral to agencies that provide these services.  Your insurance has been verified to be:  BCBS Your primary doctor is:  Prudy FeelerAngel Jones  Pertinent information will be shared with your doctor and your insurance  company.  Social Worker:  Dossie DerBecky Eryn Marandola, SW [phone] or [phone]) [phone]  Information discussed with and copy given to patient by: Lucy Chrisupree, Patte Winkel G, 01/24/2016, 10:05 AM

## 2016-01-24 NOTE — Progress Notes (Signed)
Social Work  Social Work Assessment and Plan  Patient Details  Name: Jeffery Gross MRN: [id number] Date of Birth: [birth year]  Today's Date: 01/24/2016  Problem List:  Patient Active Problem List   Diagnosis Date Noted  . Ischemia of lower extremity 01/23/2016  . Hypoalbuminemia due to protein-calorie malnutrition (HCC)   . Dysuria   . Tobacco abuse   . Constipation due to pain medication   . Chronic low back pain   . Benign essential HTN   . Asthma   . Hyponatremia   . Acute blood loss anemia   . CKD (chronic kidney disease)   . Anemia of chronic disease   . Transaminitis   . Phantom limb syndrome with pain (HCC)   . S/P AKA (above knee amputation) bilateral (HCC)   . Acute kidney injury (HCC)   . Syncope   . Pressure ulcer 01/12/2016  . Fall   . Encounter for central line placement   . Urinary retention   . AKI (acute kidney injury) (HCC)   . Traumatic rhabdomyolysis (HCC)   . Rhabdomyolysis 01/03/2016  . Nontraumatic compartment syndrome of leg 01/03/2016  . Chest pain 10/27/2012  . HTN (hypertension) 10/27/2012  . Back pain 10/27/2012   Past Medical History:  Past Medical History  Diagnosis Date  . Chest pain   . DDD (degenerative disc disease), cervical   . Back pain   . HTN (hypertension)   . Depression    Past Surgical History:  Past Surgical History  Procedure Laterality Date  . Vasectomy    . Fasciotomy Bilateral 01/03/2016    Procedure: FASCIOTOMY;  Surgeon: Tarry KosNaiping M Xu, MD;  Location: [facility];  Service: Orthopedics;  Laterality: Bilateral;  . I&d extremity Bilateral 01/05/2016    Procedure: IRRIGATION AND DEBRIDEMENT BILATERAL LOWER EXTREMITIES; WOUND VAC CHANGE;  Surgeon: Tarry KosNaiping M Xu, MD;  Location: [facility] OR;  Service: Orthopedics;  Laterality: Bilateral;  . I&d extremity Left 01/07/2016    Procedure: Irrigation and Debridement of Left Lower Extremity with Application of Wound Vac;  Surgeon: Tarry KosNaiping M Xu, MD;  Location: [facility];  Service: Orthopedics;   Laterality: Left;  . Amputation Bilateral 01/09/2016    Procedure: Bilateral Above Knee Amputation, Apply Wound VAC;  Surgeon: Nadara MustardMarcus V Duda, MD;  Location: [facility];  Service: Orthopedics;  Laterality: Bilateral;   Social History:  reports that he has been smoking Cigars.  He does not have any smokeless tobacco history on file. He reports that he drinks alcohol. His drug history is not on file.  Family / Support Systems Marital Status: Divorced Patient Roles: Parent, Other (Comment) (two ex-wifes and three children) Other Supports: Jeffery JacobsonHelen Gross-ex-wife [phone]   Janeece Ageeovella Gross-Mom [phone]  [phone] Anticipated Caregiver: Jeffery JacobsonHelen Ability/Limitations of Caregiver: Jeffery JacobsonHelen works as a Museum/gallery conservatorvet tech at [employer]several vets, also does Agricultural consultantvolunteer work for Advertising account executiveanimal rescues Caregiver Availability: Intermittent Family Dynamics: Pt has a good relationship with both ex-wifes. His first-Jeffery has offered for him to come and stay with her when discharged. She does have five steps into home and pt is working on getting a ramp built. His three children are supportive and involved along with numerous friends.  Social History Preferred language: English Religion: Non-Denominational Cultural Background: No issues Education: trade school-plumber Read: Yes Write: Yes Employment Status: Unemployed Date Retired/Disabled/Unemployed: Trying to get disability has been for 3-5 years. Has had issues with his back and legs for numerous years, feel now he will be approved Legal Hisotry/Current Legal Issues: No issues Guardian/Conservator: None-according  to MD pt is capable of making his own decisions while here.   Abuse/Neglect Physical Abuse: Denies Verbal Abuse: Denies Sexual Abuse: Denies Exploitation of patient/patient's resources: Denies Self-Neglect: Denies  Emotional Status Pt's affect, behavior adn adjustment status: Pt is motivated and very brioght he is focusing on the positive and the fact he did not die  since he came very close to this when first came into the hospital. He has always been independent and taken care of himself even with his health issues. He plans to again. Recent Psychosocial Issues: Other health issues-back, depression, etc Pyschiatric History: History of depression/anxiety takes medications for this and feels they help. Has not been to see a counselor due to his PCP prescribes medications for. This worker feels he will benefit from neuro-psych services, will make  referral for them to see. Substance Abuse History: Tobacco-cigars and ETOH feels they are not big issues and can be managed by him. He is aware of resources available to him  Patient / Family Perceptions, Expectations & Goals Pt/Family understanding of illness & functional limitations: Pt and Mom have a good understanding of his condition and treatment plan. Both are hopeful he will require HD and the cath can be removed prior to discharge form rehab. Both aware Nephrologist following and watching kidney function. Pt's questions have been answered and feels he is being heard. Premorbid pt/family roles/activities: Father, Ex-husband, Friend, Nutritional therapistlumber, etc Anticipated changes in roles/activities/participation: Will not be working when leaves the hospital. Pt/family expectations/goals: Pt states: " I need to take care of myself before I leave here."  Mom states: " I am hopeful he is willing to work hard on his independence."  [employer] Community Agencies: Other (Comment) (Working on disability last few years) Premorbid Home Care/DME Agencies: None Transportation available at discharge: family and friends Resource referrals recommended: Neuropsychology, Support group (specify)  Discharge Planning Living Arrangements: Non-relatives/Friends Support Systems: Children, Counselling psychologistarent, Other relatives, Friends/neighbors Type of Residence: Private residence Insurance Resources: Media plannerrivate Insurance (specify) Herbalist(BCBS) Financial  Resources: Other (Comment) (Side jobs) Surveyor, quantityinancial Screen Referred: Yes Living Expenses: Rent Money Management: Patient Does the patient have any problems obtaining your medications?: No Home Management: Patient Patient/Family Preliminary Plans: Plan to go to ex-wwife's-Jeffery's home upon discharge due to his home has numerous steps-17. He needs to be mod/i wheelchair level due to does not have 24 hr care. He will have people in and out and is currently working on a ramp [facility]. Aware will be short length of stay-7 days. Social Work Anticipated Follow Up Needs: HH/OP, Support Group  Clinical Impression Pleasant anxious gentleman who is trying to deal with all that has happened to him. He appears motivated and ready to work in therapies, but he may be too cheerful. Would benefit from seeing neuro-psych while here and will make referral. Will work on equipment needs and follow up needs, along with coordination of services for home. Will provide support and assist with information for disability application/lawyer.  Lucy Chrisupree, Maurina Fawaz G 01/24/2016, 10:30 AM

## 2016-01-24 NOTE — Progress Notes (Signed)
[facility]     PROGRESS NOTE    Subjective/Complaints: Had a fair night. Having a lot of phantom limb pain. Able to sleep somewhat. Trying to stay very motivated.   ROS: Pt denies fever, rash/itching, headache, blurred or double vision, nausea, vomiting, abdominal pain, diarrhea, chest pain, shortness of breath, palpitations, dysuria, dizziness, neck or back pain, bleeding, anxiety, or depression   Objective: Vital Signs: Blood pressure 117/60, pulse 72, temperature 98 F (36.7 C), temperature source Oral, resp. rate 16, height 6\' 1"  (1.854 m), weight 74.2 kg (163 lb 9.3 oz), SpO2 97 %. No results found.  Recent Labs  01/22/16 0429 01/24/16 0604  WBC 9.0 6.9  HGB 10.2* 10.2*  HCT 32.0* 32.0*  PLT 311 240    Recent Labs  01/23/16 0552 01/24/16 0604  NA 139 140  K 3.9 4.1  CL 99* 102  GLUCOSE 103* 98  BUN 68* 69*  CREATININE 3.52* 3.36*  CALCIUM 9.5 9.4   CBG (last 3)  No results for input(s): GLUCAP in the last 72 hours.  Wt Readings from Last 3 Encounters:  01/24/16 74.2 kg (163 lb 9.3 oz)  01/23/16 74.5 kg (164 lb 3.9 oz)  11/16/12 103.42 kg (228 lb)    Physical Exam:  Constitutional: He is oriented to person, place, and time. He appears well-developed and well-nourished. No distress.  HENT:  Head: Normocephalic and atraumatic.  Mouth/Throat: Oropharynx is clear and moist.  Neck: Normal range of motion. Neck supple.  Cardiovascular: Normal rate and regular rhythm.  No murmur heard. Respiratory: Effort normal and breath sounds normal. No stridor. No respiratory distress. He has no wheezes.  GI: Soft. Bowel sounds are normal. He exhibits no distension. There is no tenderness.  Musculoskeletal: He exhibits edema and tenderness.  B-AKA with sutures in place-clean,dry and intact. Bilateral AKA sensitive to touch L >R.  Neurological: He is alert and oriented to person, place, and time.  Motor: B/l UE 5/5 proximal to  distal B/l LE: hip flexion 4/5 proximal Sensation intact to light touch  Skin: Skin is warm and dry. He is not diaphoretic.  Incision c/d/i. ACE wraps Psychiatric: He has a normal mood and affect. His behavior is normal. Judgment and thought content normal.   Assessment/Plan: 1. Mobility and functional deficits secondary to bilateral AKA's/rhabdomyolysis which require 3+ hours per day of interdisciplinary therapy in a comprehensive inpatient rehab setting. Physiatrist is providing close team supervision and 24 hour management of active medical problems listed below. Physiatrist and rehab team continue to assess barriers to discharge/monitor patient progress toward functional and medical goals.  Function:  Bathing Bathing position      Bathing parts      Bathing assist        Upper Body Dressing/Undressing Upper body dressing                    Upper body assist        Lower Body Dressing/Undressing Lower body dressing                                  Lower body assist        Toileting Toileting Toileting activity did not occur: Safety/medical concerns        Toileting assist     Transfers Chair/bed transfer             Locomotion Ambulation  Wheelchair          Cognition Comprehension Comprehension assist level: Understands basic 90% of the time/cues < 10% of the time  Expression Expression assist level: Expresses basic needs/ideas: With no assist  Social Interaction Social Interaction assist level: Interacts appropriately 90% of the time - Needs monitoring or encouragement for participation or interaction.  Problem Solving Problem solving assist level: Solves basic 75 - 89% of the time/requires cueing 10 - 24% of the time  Memory Memory assist level: Recognizes or recalls 90% of the time/requires cueing < 10% of the time   Medical Problem List and Plan: 1. 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 DVT  Prophylaxis/Anticoagulation: Pharmaceutical: Lovenox 3. Pain Management: On lyrica and elavil for management of neuropathy. Continue oxycodone prn. H  -increase lyrica to 50mg  BID for phantom limb pain. Limited in titration due to renal status at present  -encouraged patient to use massage and visual feedback 4. Mood: LCSW to follow for evaluation and support.  5. Neuropsych: This patient is capable of making decisions on his own behalf. 6. Skin/Wound Care: Monitor incisions daily for healing. Educate on pressure relief measures.  7. Fluids/Electrolytes/Nutrition: Monitor I/O. Protein supplement to help supplement low stores and to promote healing.  8. Acute renal failure: HD on hold. BUN/Cr improving. Made 1200cc urine yesterday.   -maintain central line for now.  -nephrology following 9. Anemia:  hgb stable at 10.2. No signs of blood loss 10. Depression with anxiety: Mood appears to be stable. Continue Risperdal and celexa.  11. Dysuria: He reports that he is voiding without difficulty. UA negative, cx pending 12. Constipation: augmented bowel program.  13. Tobacco abuse: Counsel 14. Transaminitis: Avoid hepatotoxic meds    LOS (Days) 1 A FACE TO FACE EVALUATION WAS PERFORMED  Asja Frommer T 01/24/2016 9:16 AM

## 2016-01-24 NOTE — Progress Notes (Signed)
Patient ID: Jeffery Gross, male   DOB: October 17, 1960, 55 y.o.   MRN: [id number] [facility] Progress Note   Assessment/ Plan:   1. 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 Since then, his urine output has been improving and overnight labs show signs of renal recovery with creatinine down from 3.5 to 3.3. Clinically, he does not have any indication for hemodialysis at this time-suspect that recovery is underway.   He does not have a permanent access-currently getting hemodialysis via right IJ TDC that should be removed after verification of consistent renal recovery prior to discharge from the hospital. Will leave in for right now and check labs daily  2. 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 Wound care/wound management per orthopedic surgery. Wound VAC management previously-currently removed.  Now on rehab 3. Anemia: Secondary to surgical losses/critical illness-recent labs significant for iron deficiency and he is status post intravenous iron. Not on Aranesp. hgb over 10 4. Chronic pain/narcotic dependency: Management per primary service.  Subjective:   Moved to rehab   Objective:   BP 117/60 mmHg  Pulse 72  Temp(Src) 98 F (36.7 C) (Oral)  Resp 16  Ht 6\' 1"  (1.854 m)  Wt 74.2 kg (163 lb 9.3 oz)  BMI 21.59 kg/m2  SpO2 97%  Intake/Output Summary (Last 24 hours) at 01/24/16 0827 Last data filed at 01/24/16 [id number]  Gross per 24 hour  Intake    960 ml  Output   1700 ml  Net   -740 ml   Weight change:   Physical Exam: JXB:[id number]:Comfortably resting in bed CVS: Pulse regular rhythm, S1 and S2 normal Resp: Clear to auscultation bilaterally-no rales or rhonchi Abd: Soft, flat, nontender Ext: Status post bilateral above-knee amputations-stumps in clean/dry dressings.  Imaging: No results found.  Labs: BMET  Recent Labs Lab 01/18/16 0530  01/19/16 0527 01/20/16 [id number] 01/21/16 0323 01/22/16 0409 01/23/16 0552 01/24/16 0604  NA 137 139 139 141 138 139 140  K 4.0 3.9 4.1 3.7 3.9 3.9 4.1  CL 94* 94* 98* 92* 98* 99* 102  CO2 30 30 29 28 25 27 28   GLUCOSE 97 106* 101* 113* 104* 103* 98  BUN 55* 62* 37* 55* 63* 68* 69*  CREATININE 7.09* 6.63* 3.80* 4.13* 3.84* 3.52* 3.36*  CALCIUM 8.5* 8.9 8.9 9.9 9.3 9.5 9.4  PHOS 8.0*  --  5.5*  --  6.3* 5.7*  --    CBC  Recent Labs Lab 01/20/16 0627 01/21/16 0323 01/22/16 0429 01/24/16 0604  WBC 7.6 8.3 9.0 6.9  NEUTROABS  --   --   --  3.3  HGB 10.1* 9.4* 10.2* 10.2*  HCT 32.2* 29.7* 32.0* 32.0*  MCV 87.5 87.6 87.4 87.7  PLT 326 319 311 240   Medications:    . amitriptyline  50 mg Oral QHS  . citalopram  40 mg Oral QHS  . enoxaparin (LOVENOX) injection  30 mg Subcutaneous Q24H  . feeding supplement (PRO-STAT SUGAR FREE 64)  30 mL Oral BID  . pantoprazole  40 mg Oral Daily  . pregabalin  50 mg Oral Daily  . risperiDONE  1 mg Oral QHS  . senna-docusate  2 tablet Oral QHS  . sodium chloride flush  10-40 mL Intracatheter Q12H  . sodium chloride flush  10-40 mL Intracatheter Q12H   Patsey Pitstick A   01/24/2016, 8:27 AM

## 2016-01-25 ENCOUNTER — Inpatient Hospital Stay (HOSPITAL_COMMUNITY): Payer: BLUE CROSS/BLUE SHIELD | Admitting: Occupational Therapy

## 2016-01-25 ENCOUNTER — Inpatient Hospital Stay (HOSPITAL_COMMUNITY): Payer: BLUE CROSS/BLUE SHIELD | Admitting: Physical Therapy

## 2016-01-25 ENCOUNTER — Inpatient Hospital Stay (HOSPITAL_COMMUNITY): Payer: BLUE CROSS/BLUE SHIELD

## 2016-01-25 DIAGNOSIS — G546 Phantom limb syndrome with pain: Secondary | ICD-10-CM

## 2016-01-25 DIAGNOSIS — T796XXS Traumatic ischemia of muscle, sequela: Secondary | ICD-10-CM

## 2016-01-25 DIAGNOSIS — G8918 Other acute postprocedural pain: Secondary | ICD-10-CM | POA: Insufficient documentation

## 2016-01-25 LAB — BASIC METABOLIC PANEL
Anion gap: 12 (ref 5–15)
BUN: 62 mg/dL — AB (ref 6–20)
CO2: 26 mmol/L (ref 22–32)
CREATININE: 2.67 mg/dL — AB (ref 0.61–1.24)
Calcium: 9.5 mg/dL (ref 8.9–10.3)
Chloride: 102 mmol/L (ref 101–111)
GFR calc Af Amer: 30 mL/min — ABNORMAL LOW (ref >60)
GFR, EST NON AFRICAN AMERICAN: 25 mL/min — AB (ref >60)
GLUCOSE: 96 mg/dL (ref 65–99)
POTASSIUM: 4.2 mmol/L (ref 3.5–5.1)
SODIUM: 140 mmol/L (ref 135–145)

## 2016-01-25 LAB — URINE CULTURE: CULTURE: NO GROWTH

## 2016-01-25 MED ORDER — OXYCODONE HCL 5 MG PO TABS
5.0000 mg | ORAL_TABLET | ORAL | Status: DC | PRN
  Administered 2016-01-25 – 2016-01-30 (×21): 5 mg via ORAL
  Filled 2016-01-25 (×21): qty 1

## 2016-01-25 MED ORDER — CHLORHEXIDINE GLUCONATE 4 % EX LIQD
CUTANEOUS | Status: AC
Start: 2016-01-25 — End: 2016-01-26
  Filled 2016-01-25: qty 15

## 2016-01-25 MED ORDER — TRAMADOL HCL 50 MG PO TABS
100.0000 mg | ORAL_TABLET | Freq: Four times a day (QID) | ORAL | Status: DC | PRN
  Administered 2016-01-25 – 2016-01-30 (×15): 100 mg via ORAL
  Filled 2016-01-25 (×16): qty 2

## 2016-01-25 NOTE — Progress Notes (Signed)
[facility]IC[facility] RE[facility]ILIT[facility]     PROGRESS NOTE    Subjective/Complaints: Pt laying in bed, complaining of b/l R>L incisional leg pain.  He requests his pain meds to be changed to q4 PRN.   ROS: Pt denies CP, SOB, N/V/D.   Objective: Vital Signs: Blood pressure 116/62, pulse 72, temperature 98.1 F (36.7 C), temperature source Oral, resp. rate 16, height 6\' 1"  (1.854 m), weight 74.1 kg (163 lb 5.8 oz), SpO2 97 %. No results found.  Recent Labs  01/24/16 0604  WBC 6.9  HGB 10.2*  HCT 32.0*  PLT 240    Recent Labs  01/24/16 0604 01/25/16 0614  NA 140 140  K 4.1 4.2  CL 102 102  GLUCOSE 98 96  BUN 69* 62*  CREATININE 3.36* 2.67*  CALCIUM 9.4 9.5   CBG (last 3)  No results for input(s): GLUCAP in the last 72 hours.  Wt Readings from Last 3 Encounters:  01/25/16 74.1 kg (163 lb 5.8 oz)  01/23/16 74.5 kg (164 lb 3.9 oz)  11/16/12 103.42 kg (228 lb)    Physical Exam:  Constitutional:  He appears well-developed and well-nourished. No distress.  HENT: Normocephalic and atraumatic.  Cardiovascular: Normal rate and regular rhythm.No murmur heard. Respiratory: Effort normal and breath sounds normal. No stridor. No respiratory distress. He has no wheezes.  GI: Soft. Bowel sounds are normal. He exhibits no distension. There is no tenderness.  Musculoskeletal: He exhibits edema and tenderness.  Bilateral AKA sensitive to touch R >L.  Neurological: He is alert and oriented.  Motor: B/l UE 5/5 proximal to distal B/l LE: hip flexion 4/5 proximal (pain inhibition) Sensation intact to light touch  Skin: Skin is warm and dry. He is not diaphoretic.  B/l AKA with sutures in place-clean,dry and intact.  Psychiatric: He has a normal mood and affect. His behavior is normal. Judgment and thought content normal.   Assessment/Plan: 1. Mobility and functional deficits secondary to bilateral AKA's/rhabdomyolysis which require 3+ hours per day of  interdisciplinary therapy in a comprehensive inpatient rehab setting. Physiatrist is providing close team supervision and 24 hour management of active medical problems listed below. Physiatrist and rehab team continue to assess barriers to discharge/monitor patient progress toward functional and medical goals.  Function:  Bathing Bathing position   Position: Sitting EOB (Simulated due to already completed prior to skilled OT)  Bathing parts Body parts bathed by patient: Right arm, Left arm, Chest, Abdomen, Front perineal area, Buttocks, Right upper leg, Left upper leg, Right lower leg, Left lower leg Body parts bathed by helper: Back  Bathing assist Assist Level: Supervision or verbal cues      Upper Body Dressing/Undressing Upper body dressing                    Upper body assist Assist Level: Supervision or verbal cues      Lower Body Dressing/Undressing Lower body dressing   What is the patient wearing?: Pants     Pants- Performed by patient: Thread/unthread right pants leg, Thread/unthread left pants leg, Pull pants up/down (simulated due to pt already completing prior to tx)                        Lower body assist Assist for lower body dressing: Supervision or verbal cues      Toileting Toileting Toileting activity did not occur: Safety/medical concerns Toileting steps completed by patient: Adjust clothing prior to toileting, Adjust  clothing after toileting, Performs perineal hygiene (simulated )      Toileting assist Assist level: Supervision or verbal cues   Transfers Chair/bed transfer   Chair/bed transfer method: Lateral scoot Chair/bed transfer assist level: Touching or steadying assistance (Pt > 75%)       Locomotion Ambulation Ambulation activity did not occur: Safety/medical concerns         Wheelchair   Type: Manual Max wheelchair distance: [id number] Assist Level: Supervision or verbal cues  Cognition Comprehension Comprehension  assist level: Understands basic 90% of the time/cues < 10% of the time  Expression Expression assist level: Expresses basic needs/ideas: With no assist  Social Interaction Social Interaction assist level: Interacts appropriately 90% of the time - Needs monitoring or encouragement for participation or interaction.  Problem Solving Problem solving assist level: Solves basic 75 - 89% of the time/requires cueing 10 - 24% of the time  Memory Memory assist level: Recognizes or recalls 90% of the time/requires cueing < 10% of the time   Medical Problem List and Plan: 1. Gait abnormality secondary to B/l AKA  Cont CIR 2. DVT Prophylaxis/Anticoagulation: Pharmaceutical: Lovenox 3. Pain Management: On lyrica and elavil for management of neuropathy.  Continue oxycodone prn, tramadol prn, will wean as tolerated  -increased lyrica to 50mg  BID for phantom limb pain. Limited in titration due to renal status at present  -encouraged patient to use massage and visual feedback 4. Mood: LCSW to follow for evaluation and support.  5. Neuropsych: This patient is capable of making decisions on his own behalf. 6. Skin/Wound Care: Monitor incisions daily for healing. Educate on pressure relief measures.  7. Fluids/Electrolytes/Nutrition: Monitor I/O. Protein supplement to help supplement low stores and to promote healing.  8. Acute renal failure: HD on hold. BUN/Cr improving.   -nephrology following. Appreciate recs. 9. Anemia:  hgb stable at 10.2 on 7/6. No signs of blood loss 10. Depression with anxiety: Mood appears to be stable. Continue Risperdal and celexa.  11. Dysuria: Improving. UA negative, cx neg 12. Constipation: augmented bowel program.  13. Tobacco abuse: Counsel 14. Transaminitis: Avoid hepatotoxic meds  LOS (Days) 2 A FACE TO FACE EVALUATION WAS PERFORMED  Ankit Karis Jubanil Patel 01/25/2016 10:26 AM

## 2016-01-25 NOTE — Procedures (Signed)
RIJV HD cath removal No comp/EBL 

## 2016-01-25 NOTE — Progress Notes (Signed)
Occupational Therapy Session Note  Patient Details  Name: Jeffery Gross MRN: [id number] Date of Birth: March 23, 1961  Today's Date: 01/25/2016 OT Individual Time: 1300-1401 OT Individual Time Calculation (min): 61 minutes    Short Term Goals: Week 1:  OT Short Term Goal 1 (Week 1): Pt will complete UB dressing with Mod I  OT Short Term Goal 2 (Week 1): Pt will complete toilet transfer and tasks with Mod I  OT Short Term Goal 3 (Week 1): Pt will complete LB dressing Mod I bedlevel or w/c level  OT Short Term Goal 4 (Week 1): Pt will complete functional tub bench transfer with supervison  OT Short Term Goal 5 (Week 1): Pt will complete bathing Mod I w/c level, EOB or in shower  Skilled Therapeutic Interventions/Progress Updates:    Upon skilled OT arrival pt was in bed and reported 9/10 pain in R LE. Nurse was notified and provided pain medication prior to tx. Pt still agreeable to participate in therapy today and self propelled down hall to therapy shower room. Pt completed shower transfers via tub transfer bench with Min [place] in anterior-posterior and lateral transfer methods. Pt reported not knowing bathroom setup for sure but that there will be a tub/shower combination. Pt reported that he would most likely bathe in bed, w/c, and in shower at time of discharge. Pt self propelled to therapy kitchen to simulate kitchen task completion w/c level utilizing energy conservation principles and AE with provided education. Pt verbalized understanding with kitchen modification recommendations and safety (i.e. Locking w/c brakes when stopping to complete a task). Pt reports that he often forgets to lock the w/c brakes. Pt participated in light housekeeping tasks in kitchen with education on built up cleaning supplies. Pt completed all tasks in kitchen at supervision level. Pt was provided half lap tray for transporting items in room with education on self purchase at time of discharge to safely transport items  in home. Pt reported being appreciative of lap tray. Pt returned to room with all needs within reach.    Therapy Documentation Precautions:  Precautions Precautions: Fall Restrictions Weight Bearing Restrictions: Yes RLE Weight Bearing: Non weight bearing LLE Weight Bearing: Non weight bearing General:   Vital Signs: Therapy Vitals Temp: 98.4 F (36.9 C) Temp Source: Oral Pulse Rate: 78 Resp: 16 BP: 123/69 mmHg Patient Position (if appropriate): Lying Oxygen Therapy SpO2: 98 % O2 Device: Not Delivered Pain: Pain Assessment Pain Assessment: 0-10 Pain Score: 4      See Function Navigator for Current Functional Status.   Therapy/Group: Individual Therapy  Laurelle Skiver A Iren Whipp 01/25/2016, 3:58 PM

## 2016-01-25 NOTE — Progress Notes (Signed)
Increased pain to right AKA at HS. "worse pain since surgery."  Removed dressing and assessed incision, no change. PRN Oxy IR and robaxin given at 2158. Complained of itching "all over", no rash observed, PRN benadryl given at 0018. Rested quietly rest of night. Alfredo MartinezMurray, Wojciech Willetts A

## 2016-01-25 NOTE — Progress Notes (Signed)
Physical Therapy Note  Patient Details  Name: Rip HarbourScott D Tener MRN: [id number] Date of Birth: 19-Aug-1960 Today's Date: 01/25/2016    Time: 1030-1126 56 minutes  1:1 Pt c/o intermittent pain in Rt LE, RN aware, meds rec'd during session.  Pt able to perform scooting transfers and w/c mobility and parts management with mod I.  Simulated car transfer to 28'' height to simulate SUV. Pt able to perform with close supervision with and without sliding board. Pt states he prefers use of sliding board for safety.  Sidelying hip abduction and extension 2 x 15, prone hip extension bilat 2 x 15 and prone press up on elbows 5 x 20 seconds.  Pt educated on importance of lying prone every day.  Trunk PNFs with medicine ball (3000g) 2 x 10 bilat.  Pt with good motivation and improving independence with mobility.   Dameshia Seybold 01/25/2016, 11:26 AM

## 2016-01-25 NOTE — Progress Notes (Signed)
Social Work Patient ID: Jeffery HarbourScott D Gross, male   DOB: 06/03/1961, 55 y.o.   MRN: [id number] Pt wanted worker to get a list of MD's with addresses and phone numbers for his disability lawyer to contact regarding his condition. Have faxed the information to His lawyer's office. 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 Pt is having a good day and is trying to handle the pain. MD has adjusted his pain medications.

## 2016-01-25 NOTE — Progress Notes (Signed)
Patient ID: Jeffery Gross, male   DOB: [birth year], 55 y.o.   MRN: [id number] [facility] Progress Note   Assessment/ Plan:   1. AKI: Resolving.  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 Clearlyt recovering with strong inc in UOP and drop in SCr.  Remove HD cath.    2. 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 Wound care/wound management per orthopedic surgery. Wound VAC management previously-currently removed.  Now on rehab 3. Anemia: Secondary to surgical losses/critical illness-recent labs significant for iron deficiency and he is status post intravenous iron. Not on Aranesp. hgb over 10 4. Chronic pain/narcotic dependency: Management per primary service.  Will sign off for now.  Please call with any questions or concerns.  Pt does need follow up with nephrology and I will arrange.     Subjective:   Great UOP and downtrending SCr Great spirits No new complaints Has TDC   Objective:   BP 116/62 mmHg  Pulse 72  Temp(Src) 98.1 F (36.7 C) (Oral)  Resp 16  Ht 6\' 1"  (1.854 m)  Wt 74.1 kg (163 lb 5.8 oz)  BMI 21.56 kg/m2  SpO2 97%  Intake/Output Summary (Last 24 hours) at 01/25/16 0947 Last data filed at 01/25/16 0700  Gross per 24 hour  Intake   1260 ml  Output   2500 ml  Net  -1240 ml   Weight change: -0.7 kg (-1 lb 8.7 oz)  Physical Exam: WGN:[id number]:Comfortably resting in bed CVS: Pulse regular rhythm, S1 and S2 normal Resp: Clear to auscultation bilaterally-no rales or rhonchi Abd: Soft, flat, nontender Ext: Status post bilateral above-knee amputations-stumps in clean/dry dressings.  Imaging: No results found.  Labs: BMET  Recent Labs Lab 01/19/16 0527 01/20/16 [id number] 01/21/16 0323 01/22/16 0409 01/23/16 0552 01/24/16 0604 01/25/16 0614  NA 139 139 141 138 139 140 140  K 3.9 4.1 3.7 3.9 3.9 4.1 4.2  CL 94* 98* 92* 98* 99* 102  102  CO2 30 29 28 25 27 28 26   GLUCOSE 106* 101* 113* 104* 103* 98 96  BUN 62* 37* 55* 63* 68* 69* 62*  CREATININE 6.63* 3.80* 4.13* 3.84* 3.52* 3.36* 2.67*  CALCIUM 8.9 8.9 9.9 9.3 9.5 9.4 9.5  PHOS  --  5.5*  --  6.3* 5.7*  --   --    CBC  Recent Labs Lab 01/20/16 0627 01/21/16 0323 01/22/16 0429 01/24/16 0604  WBC 7.6 8.3 9.0 6.9  NEUTROABS  --   --   --  3.3  HGB 10.1* 9.4* 10.2* 10.2*  HCT 32.2* 29.7* 32.0* 32.0*  MCV 87.5 87.6 87.4 87.7  PLT 326 319 311 240   Medications:    . amitriptyline  50 mg Oral QHS  . citalopram  40 mg Oral QHS  . enoxaparin (LOVENOX) injection  30 mg Subcutaneous Q24H  . feeding supplement (PRO-STAT SUGAR FREE 64)  30 mL Oral BID  . pantoprazole  40 mg Oral Daily  . pregabalin  50 mg Oral BID  . risperiDONE  1 mg Oral QHS  . senna-docusate  2 tablet Oral QHS  . sodium chloride flush  10-40 mL Intracatheter Q12H   Jeffery Gross   01/25/2016, 9:47 AM

## 2016-01-25 NOTE — Progress Notes (Signed)
Occupational Therapy Session Note  Patient Details  Name: Jeffery Gross MRN: [id number] Date of Birth: 07/16/1961  Today's Date: 01/25/2016 OT Individual Time: [id number] OT Individual Time Calculation (min): 75 min    Short Term Goals: Week 1:  OT Short Term Goal 1 (Week 1): Pt will complete UB dressing with Mod I  OT Short Term Goal 2 (Week 1): Pt will complete toilet transfer and tasks with Mod I  OT Short Term Goal 3 (Week 1): Pt will complete LB dressing Mod I bedlevel or w/c level  OT Short Term Goal 4 (Week 1): Pt will complete functional tub bench transfer with supervison  OT Short Term Goal 5 (Week 1): Pt will complete bathing Mod I w/c level, EOB or in shower  Skilled Therapeutic Interventions/Progress Updates:    Pt completed bathing and dressing from wheelchair level at the sink with setup assist only.  Lateral leans from wheelchair for washing peri area as well as for pulling underpants over hips.  Educated pt to wheel his chair beside of the bed for indreased lateral lean to one side to increase independence with pulling garments over hips as well.  He completed transfer to the regular toilet during session as well with supervision for lateral scoot.  Clothing management and hygiene completed with supervision as well.  Educated pt on stump wrapping for AKA but he did not want ace bandages to go around his waist to help support it.  Therapist instead just wrapped them in the same method as a BKA.  Pt left with PT at end of session in wheelchair.    Therapy Documentation Precautions:  Precautions Precautions: Fall Restrictions Weight Bearing Restrictions: Yes RLE Weight Bearing: Non weight bearing LLE Weight Bearing: Non weight bearing   Pain: Pain Assessment Pain Assessment: Faces Pain Score: 4  Faces Pain Scale: Hurts a little bit Pain Type: Acute pain Pain Location: Leg Pain Orientation: Right;Left Pain Intervention(s): Repositioned;Emotional support ADL: See  Function Navigator for Current Functional Status.   Therapy/Group: Individual Therapy  Rakeen Gaillard OTR/L 01/25/2016, 12:15 PM

## 2016-01-26 ENCOUNTER — Inpatient Hospital Stay (HOSPITAL_COMMUNITY): Payer: BLUE CROSS/BLUE SHIELD | Admitting: Occupational Therapy

## 2016-01-26 ENCOUNTER — Inpatient Hospital Stay (HOSPITAL_COMMUNITY): Payer: BLUE CROSS/BLUE SHIELD | Admitting: Physical Therapy

## 2016-01-26 LAB — BASIC METABOLIC PANEL
ANION GAP: 11 (ref 5–15)
BUN: 57 mg/dL — ABNORMAL HIGH (ref 6–20)
CALCIUM: 9.6 mg/dL (ref 8.9–10.3)
CO2: 25 mmol/L (ref 22–32)
CREATININE: 2.47 mg/dL — AB (ref 0.61–1.24)
Chloride: 103 mmol/L (ref 101–111)
GFR, EST AFRICAN AMERICAN: 32 mL/min — AB (ref >60)
GFR, EST NON AFRICAN AMERICAN: 28 mL/min — AB (ref >60)
Glucose, Bld: 94 mg/dL (ref 65–99)
Potassium: 4.4 mmol/L (ref 3.5–5.1)
SODIUM: 139 mmol/L (ref 135–145)

## 2016-01-26 NOTE — Progress Notes (Signed)
Jeffery Gross PHYSICAL MEDICINE & REHABILITATION     PROGRESS NOTE    Subjective/Complaints: Pt laying in bed this AM. He had some pain with the change in medications yesterday, but that has stabilized.   ROS: Mild pain b/l stumps R>L. Pt denies CP, SOB, N/V/D.  Objective: Vital Signs: Blood pressure 120/65, pulse 64, temperature 97.9 F (36.6 C), temperature source Oral, resp. rate 18, height 6\' 1"  (1.854 m), weight 70.716 kg (155 lb 14.4 oz), SpO2 97 %. Ir Removal Tun Cv Cath W/o Fl  01/25/2016  INDICATION: Renal failure EXAM: REMOVAL TUNNELED CENTRAL VENOUS CATHETER MEDICATIONS: None; The antibiotic was administered within an appropriate time interval prior to skin puncture. ANESTHESIA/SEDATION: None . FLUOROSCOPY TIME:  None COMPLICATIONS: None immediate. PROCEDURE: Informed written consent was obtained from the patient after a thorough discussion of the procedural risks, benefits and alternatives. All questions were addressed. Maximal Sterile Barrier Technique was utilized including caps, mask, sterile gowns, sterile gloves, sterile drape, hand hygiene and skin antiseptic. A timeout was performed prior to the initiation of the procedure. The right chest was prepped with ChloraPrep in a sterile fashion, and a sterile drape was applied covering the operative field. A sterile gown and sterile gloves were used for the procedure. Utilizing blunt dissection, the cuff of the catheter was freed from the underlying subcutaneous tissue. The catheter was removed in its entirety. Hemostasis was achieved with direct pressure. IMPRESSION: Successful tunneled dialysis catheter removal. Electronically Signed   By: Jolaine ClickArthur  Hoss M.D.   On: 01/25/2016 15:47    Recent Labs  01/24/16 0604  WBC 6.9  HGB 10.2*  HCT 32.0*  PLT 240    Recent Labs  01/25/16 0614 01/26/16 0421  NA 140 139  K 4.2 4.4  CL 102 103  GLUCOSE 96 94  BUN 62* 57*  CREATININE 2.67* 2.47*  CALCIUM 9.5 9.6   CBG (last 3)  No  results for input(s): GLUCAP in the last 72 hours.  Wt Readings from Last 3 Encounters:  01/26/16 70.716 kg (155 lb 14.4 oz)  01/23/16 74.5 kg (164 lb 3.9 oz)  11/16/12 103.42 kg (228 lb)    Physical Exam:  Constitutional:  He appears well-developed and well-nourished. No distress.  HENT: Normocephalic and atraumatic.  Cardiovascular: Normal rate and regular rhythm.No murmur heard. Respiratory: Effort normal and breath sounds normal. No stridor. No respiratory distress. He has no wheezes.  GI: Soft. Bowel sounds are normal. He exhibits no distension. There is no tenderness.  Musculoskeletal: He exhibits edema and tenderness.  Bilateral AKA sensitive to touch R >L (improving).  Neurological: He is alert and oriented.  Motor: B/l UE 5/5 proximal to distal B/l LE: hip flexion 4+/5 proximal (pain inhibition) Sensation intact to light touch  Skin: Skin is warm and dry. He is not diaphoretic.  B/l AKA with sutures in place-clean,dry and intact.  Psychiatric: Very positive. He has a normal mood and affect. His behavior is normal. Judgment and thought content normal.   Assessment/Plan: 1. Mobility and functional deficits secondary to bilateral AKA's/rhabdomyolysis which require 3+ hours per day of interdisciplinary therapy in a comprehensive inpatient rehab setting. Physiatrist is providing close team supervision and 24 hour management of active medical problems listed below. Physiatrist and rehab team continue to assess barriers to discharge/monitor patient progress toward functional and medical goals.  Function:  Bathing Bathing position   Position: Wheelchair/chair at sink  Bathing parts Body parts bathed by patient: Right arm, Left arm, Chest, Abdomen, Front perineal area,  Buttocks, Right upper leg, Left upper leg Body parts bathed by helper: Back  Bathing assist Assist Level: Set up      Upper Body Dressing/Undressing Upper body dressing   What is the patient wearing?:  Pull over shirt/dress     Pull over shirt/dress - Perfomed by patient: Thread/unthread right sleeve, Thread/unthread left sleeve, Put head through opening, Pull shirt over trunk          Upper body assist Assist Level: More than reasonable time      Lower Body Dressing/Undressing Lower body dressing   What is the patient wearing?: Underwear, Pants Underwear - Performed by patient: Thread/unthread right underwear leg, Thread/unthread left underwear leg, Pull underwear up/down   Pants- Performed by patient: Thread/unthread right pants leg, Thread/unthread left pants leg, Pull pants up/down                        Lower body assist Assist for lower body dressing: Supervision or verbal cues      Toileting Toileting Toileting activity did not occur: Safety/medical concerns Toileting steps completed by patient: Adjust clothing prior to toileting, Adjust clothing after toileting, Performs perineal hygiene      Toileting assist Assist level: Supervision or verbal cues   Transfers Chair/bed transfer   Chair/bed transfer method:  (anterior posterior) Chair/bed transfer assist level: Supervision or verbal cues Chair/bed transfer assistive device: Armrests     Locomotion Ambulation Ambulation activity did not occur: Safety/medical concerns         Wheelchair   Type: Manual Max wheelchair distance: 340ft Assist Level: Supervision or verbal cues  Cognition Comprehension Comprehension assist level: Understands basic 90% of the time/cues < 10% of the time  Expression Expression assist level: Expresses basic needs/ideas: With no assist  Social Interaction Social Interaction assist level: Interacts appropriately 90% of the time - Needs monitoring or encouragement for participation or interaction.  Problem Solving Problem solving assist level: Solves basic 75 - 89% of the time/requires cueing 10 - 24% of the time  Memory Memory assist level: Recognizes or recalls 90% of the  time/requires cueing < 10% of the time   Medical Problem List and Plan: 1. Gait abnormality secondary to B/l AKA  Cont CIR, making good progress with therapies 2. DVT Prophylaxis/Anticoagulation: Pharmaceutical: Lovenox 3. Pain Management: On lyrica and elavil for management of neuropathy.  Continue oxycodone prn, tramadol prn, will wean as tolerated  -increased lyrica to  BID for phantom limb pain. Limited in titration due to renal status at present  -encouraged patient to use massage and visual feedback 4. Mood: LCSW to follow for evaluation and support.  5. Neuropsych: This patient is capable of making decisions on his own behalf. 6. Skin/Wound Care: Monitor incisions daily for healing. Educate on pressure relief measures.  7. Fluids/Electrolytes/Nutrition: Monitor I/O. Protein supplement to help supplement low stores and to promote healing.  8. Acute renal failure: HD on hold.   BUN/Cr cont to improve.   Nephrology following. Appreciate recs. 9. Anemia:  hgb stable at 10.2 on 7/6. No signs of blood loss 10. Depression with anxiety: Mood appears to be stable. Continue Risperdal and celexa.  11. Dysuria: Improving. UA negative, cx neg 12. Constipation: augmented bowel program.  13. Tobacco abuse: Counsel 14. Transaminitis: Avoid hepatotoxic meds  LOS (Days) 3 A FACE TO FACE EVALUATION WAS PERFORMED  Ankit Karis Juba 01/26/2016 8:18 AM

## 2016-01-26 NOTE — Progress Notes (Signed)
Occupational Therapy Session Note  Patient Details  Name: Jeffery Gross D Brandle MRN: [id number] Date of Birth: 18-Nov-1960  Today's Date: 01/26/2016 OT Individual Time: [id number] OT Individual Time Calculation (min): 60 min    Short Term Goals: Week 1:  OT Short Term Goal 1 (Week 1): Pt will complete UB dressing with Mod I  OT Short Term Goal 2 (Week 1): Pt will complete toilet transfer and tasks with Mod I  OT Short Term Goal 3 (Week 1): Pt will complete LB dressing Mod I bedlevel or w/c level  OT Short Term Goal 4 (Week 1): Pt will complete functional tub bench transfer with supervison  OT Short Term Goal 5 (Week 1): Pt will complete bathing Mod I w/c level, EOB or in shower  Skilled Therapeutic Interventions/Progress Updates:    Upon entering the room, pt in wheelchair at sink running water for bathing. Pt with 4/10 pain in B residual limbs but is agreeable to OT intervention. Pt obtaining all clothing items and self care items from wheelchair level without assistance. LB self care performed from  Wheelchair level with pt performing lateral leans for clothing management. UB self care and grooming performed with mod I in wheelchair at sink. OT demonstrating how to wrap residual limb with ACE. Pt returned demonstrations and wrapped both residual limbs with min verbal cues for technique. Pt remained in wheelchair at end of session with call bell and all needed items within reach.   Therapy Documentation Precautions:  Precautions Precautions: Fall Restrictions Weight Bearing Restrictions: Yes RLE Weight Bearing: Non weight bearing LLE Weight Bearing: Non weight bearing Pain: Pain Assessment Pain Assessment: 0-10 Pain Score: 4  Pain Type: Acute pain Pain Location: Leg Pain Orientation: Right Pain Descriptors / Indicators: Aching Pain Frequency: Intermittent Pain Onset: On-going Pain Intervention(s): Medication (See eMAR) Multiple Pain Sites: No  See Function Navigator for Current  Functional Status.   Therapy/Group: Individual Therapy  Lowella Gripittman, Haroon Shatto L 01/26/2016, 12:30 PM

## 2016-01-26 NOTE — Progress Notes (Signed)
Physical Therapy Session Note  Patient Details  Name: Jeffery Gross MRN: [id number] Date of Birth: Aug 24, 1960  Today's Date: 01/26/2016 PT Individual Time:1516-1616 Calculated time: 60 minutes.       Short Term Goals: Week 1:  PT Short Term Goal 1 (Week 1): STG =LTG due to ELOS.   Skilled Therapeutic Interventions/Progress Updates:    Patient received sitting in WC in room and agreeable to PT. [facility] for 51700ftx 2 in controlled environment on tile floor. With min cues for improved forward weight shift to prevent posterior tipping as well as navigation in hospital.  WC mobility in simulated community environment including cement and paved sidewalk with supervision A. For 41900ft and up down handicap access ramp for 18050ft. PT provided min-mod cues for improved anterior weight shift and increased awareness of WC parts and size to avoid obstacles.   Patient performed later scoot to mat table in gym with supervision A PT. Patient noted to have posterior LOB onto matbut able to self correct with instruction from PT for improved hip/head relationship.    therex  Hip abduction in supine x 10 BL  Attempted side lying hip abduction, but patient noted increased back pain side lying position Prone hip extension x 12 BLE  Supine hip flexion x 12 BLE.  PT provided min cues for improved technique including proper ROM, decreased compensation and improved control in eccentric direction.   Patient returned to room and left in bed with call bell within reach.    Therapy Documentation Precautions:  Precautions Precautions: Fall Restrictions Weight Bearing Restrictions: Yes RLE Weight Bearing: Non weight bearing LLE Weight Bearing: Non weight bearing General:   Vital Signs: Therapy Vitals Temp: 98.2 F (36.8 C) Temp Source: Oral Pulse Rate: 98 Resp: 16 BP: 127/76 mmHg Patient Position (if appropriate): Sitting Oxygen Therapy SpO2: 100 % O2 Device: Not  Delivered Pain: Pain Assessment Pain Assessment: 0-10 Pain Score: 4  Pain Type: Acute pain Pain Location: Leg Pain Orientation: Right Pain Descriptors / Indicators: Aching Pain Frequency: Intermittent Pain Onset: On-going Pain Intervention(s): Medication (See eMAR)   See Function Navigator for Current Functional Status.   Therapy/Group: Individual Therapy  Golden Popustin E Emmaleigh Longo 01/26/2016, 3:55 PM

## 2016-01-26 NOTE — Progress Notes (Signed)
Occupational Therapy Session Note  Patient Details  Name: Jeffery Gross MRN: [id number] Date of Birth: 1961-06-27  Today's Date: 01/26/2016   OT Individual Time:75 min  1345-1500   Short Term Goals: Week 1:  OT Short Term Goal 1 (Week 1): Pt will complete UB dressing with Mod I  OT Short Term Goal 2 (Week 1): Pt will complete toilet transfer and tasks with Mod I  OT Short Term Goal 3 (Week 1): Pt will complete LB dressing Mod I bedlevel or w/c level  OT Short Term Goal 4 (Week 1): Pt will complete functional tub bench transfer with supervison  OT Short Term Goal 5 (Week 1): Pt will complete bathing Mod I w/c level, EOB or in shower Week 2:     Skilled Therapeutic Interventions/Progress Updates:    Pt sitting in wc upon OT arrival.  Addressed wc mobility on uneven and even surfaces, memory recall of places pt propelled wc and negoiation of way to room. Ptt compensated well with obstacles and unlevel surfaces in wc. . Rolled WC about 300 fett with no signs of fatigue except when going up hill.   He  wrapped both legs with ace bandages (SBA), transfer to dining chair with mod I.  Returned to room and left with family  Therapy Documentation Precautions:  Precautions Precautions: Fall Restrictions Weight Bearing Restrictions: Yes RLE Weight Bearing: Non weight bearing LLE Weight Bearing: Non weight bearing     Vital Signs: Therapy Vitals Temp: 98.2 F (36.8 C) Temp Source: Oral Pulse Rate: 98 Resp: 16 BP: 127/76 mmHg Patient Position (if appropriate): Sitting Oxygen Therapy SpO2: 100 % O2 Device: Not Delivered Pain: Pain Assessment Pain Assessment: 0-10 Pain Score: 7 Pain Type: Acute pain Pain Location: Leg Pain Orientation: Right Pain Descriptors / Indicators: Aching Pain Frequency: Intermittent Pain Onset: On-going Pain Intervention(s): Medication (See eMAR)       See Function Navigator for Current Functional Status.   Therapy/Group: Individual  Therapy  Jeffery Gross, Jeffery Gross 01/26/2016, 3:16 PM

## 2016-01-26 NOTE — Progress Notes (Signed)
Pain better managed tonight. Benadryl given at 2040, for complaint of itching. PRN robaxin, Oxy IR and ultram given during the night. Alfredo MartinezMurray, Marguis Mathieson A

## 2016-01-27 ENCOUNTER — Inpatient Hospital Stay (HOSPITAL_COMMUNITY): Payer: BLUE CROSS/BLUE SHIELD | Admitting: Occupational Therapy

## 2016-01-27 ENCOUNTER — Inpatient Hospital Stay (HOSPITAL_COMMUNITY): Payer: BLUE CROSS/BLUE SHIELD | Admitting: Physical Therapy

## 2016-01-27 LAB — BASIC METABOLIC PANEL
ANION GAP: 12 (ref 5–15)
BUN: 55 mg/dL — ABNORMAL HIGH (ref 6–20)
CALCIUM: 9.7 mg/dL (ref 8.9–10.3)
CO2: 26 mmol/L (ref 22–32)
CREATININE: 2.4 mg/dL — AB (ref 0.61–1.24)
Chloride: 101 mmol/L (ref 101–111)
GFR, EST AFRICAN AMERICAN: 34 mL/min — AB (ref >60)
GFR, EST NON AFRICAN AMERICAN: 29 mL/min — AB (ref >60)
Glucose, Bld: 112 mg/dL — ABNORMAL HIGH (ref 65–99)
Potassium: 4.5 mmol/L (ref 3.5–5.1)
SODIUM: 139 mmol/L (ref 135–145)

## 2016-01-27 NOTE — Progress Notes (Signed)
Occupational Therapy Session Note  Patient Details  Name: Jeffery Gross MRN: [id number] Date of Birth: 1960/08/02  Today's Date: 01/27/2016 OT Individual Time:  - 735-835 Total minutes = 60      Short Term Goals: Week 1:  OT Short Term Goal 1 (Week 1): Pt will complete UB dressing with Mod I  OT Short Term Goal 2 (Week 1): Pt will complete toilet transfer and tasks with Mod I  OT Short Term Goal 3 (Week 1): Pt will complete LB dressing Mod I bedlevel or w/c level  OT Short Term Goal 4 (Week 1): Pt will complete functional tub bench transfer with supervison  OT Short Term Goal 5 (Week 1): Pt will complete bathing Mod I w/c level, EOB or in shower  Skilled Therapeutic Interventions/Progress Updates: Patient seen for w/c to/fr tub bench transfer in both ADL bathrooms with supervision.   As well, he completed w/c endurance activities wheel chair level. As well, patient completed planning for discharge such as calling family members who may come up for patient education to practice car transfers and other mobility and supervisory activities.     Therapy Documentation Precautions:  Precautions Precautions: Fall Restrictions Weight Bearing Restrictions: Yes RLE Weight Bearing: Non weight bearing LLE Weight Bearing: Non weight bearing   Pain:denied    See Function Navigator for Current Functional Status.   Therapy/Group: Individual Therapy  [name], Rockell Faulks [facility] LLC[facility] 01/27/2016, 1:35 PM

## 2016-01-27 NOTE — Progress Notes (Signed)
[place] PHYSICAL MEDICINE & REHABILITATION     PROGRESS NOTE    Subjective/Complaints: Pt laying in bed this AM.  He states he kept on being woken up throughout the night.  He would like to know why is RLE hurts more than his LLE.   ROS: Mild pain b/l stumps R>L. Pt denies CP, SOB, N/V/D.  Objective: Vital Signs: Blood pressure 127/76, pulse 98, temperature 98.2 F (36.8 C), temperature source Oral, resp. rate 16, height 6\' 1"  (1.854 m), weight 70.716 kg (155 lb 14.4 oz), SpO2 100 %. Ir Removal Tun Cv Cath W/o Fl  01/25/2016  INDICATION: Renal failure EXAM: REMOVAL TUNNELED CENTRAL VENOUS CATHETER MEDICATIONS: None; The antibiotic was administered within an appropriate time interval prior to skin puncture. ANESTHESIA/SEDATION: None . FLUOROSCOPY TIME:  None COMPLICATIONS: None immediate. PROCEDURE: Informed written consent was obtained from the patient after a thorough discussion of the procedural risks, benefits and alternatives. All questions were addressed. Maximal Sterile Barrier Technique was utilized including caps, mask, sterile gowns, sterile gloves, sterile drape, hand hygiene and skin antiseptic. A timeout was performed prior to the initiation of the procedure. The right chest was prepped with ChloraPrep in a sterile fashion, and a sterile drape was applied covering the operative field. A sterile gown and sterile gloves were used for the procedure. Utilizing blunt dissection, the cuff of the catheter was freed from the underlying subcutaneous tissue. The catheter was removed in its entirety. Hemostasis was achieved with direct pressure. IMPRESSION: Successful tunneled dialysis catheter removal. Electronically Signed   By: Jolaine ClickArthur  Hoss M.D.   On: 01/25/2016 15:47   No results for input(s): WBC, HGB, HCT, PLT in the last 72 hours.  Recent Labs  01/25/16 0614 01/26/16 0421  NA 140 139  K 4.2 4.4  CL 102 103  GLUCOSE 96 94  BUN 62* 57*  CREATININE 2.67* 2.47*  CALCIUM 9.5 9.6    CBG (last 3)  No results for input(s): GLUCAP in the last 72 hours.  Wt Readings from Last 3 Encounters:  01/26/16 70.716 kg (155 lb 14.4 oz)  01/23/16 74.5 kg (164 lb 3.9 oz)  11/16/12 103.42 kg (228 lb)    Physical Exam:  Constitutional:  He appears well-developed and well-nourished. No distress.  HENT: Normocephalic and atraumatic.  Cardiovascular: Normal rate and regular rhythm.No murmur heard. Respiratory: Effort normal and breath sounds normal. No stridor. No respiratory distress. He has no wheezes.  GI: Soft. Bowel sounds are normal. He exhibits no distension. There is no tenderness.  Musculoskeletal: He exhibits edema and tenderness.  Bilateral AKA sensitive to touch R >L (improving).  Neurological: He is alert and oriented.  Motor: B/l UE 5/5 proximal to distal B/l LE: hip flexion 4+/5 proximal (pain inhibition) Sensation intact to light touch  Skin: Skin is warm and dry. He is not diaphoretic.  B/l AKA with sutures in place-clean,dry and intact.  Psychiatric: Very positive. He has a normal mood and affect. His behavior is normal. Judgment and thought content normal.   Assessment/Plan: 1. Mobility and functional deficits secondary to bilateral AKA's/rhabdomyolysis which require 3+ hours per day of interdisciplinary therapy in a comprehensive inpatient rehab setting. Physiatrist is providing close team supervision and 24 hour management of active medical problems listed below. Physiatrist and rehab team continue to assess barriers to discharge/monitor patient progress toward functional and medical goals.  Function:  Bathing Bathing position   Position: Wheelchair/chair at sink  Bathing parts Body parts bathed by patient: Right arm, Left arm,  Chest, Abdomen, Front perineal area, Buttocks, Right upper leg, Left upper leg Body parts bathed by helper: Back  Bathing assist Assist Level: Supervision or verbal cues      Upper Body Dressing/Undressing Upper body  dressing   What is the patient wearing?: Pull over shirt/dress     Pull over shirt/dress - Perfomed by patient: Thread/unthread right sleeve, Thread/unthread left sleeve, Put head through opening, Pull shirt over trunk          Upper body assist Assist Level: More than reasonable time      Lower Body Dressing/Undressing Lower body dressing   What is the patient wearing?: Underwear, Pants Underwear - Performed by patient: Thread/unthread right underwear leg, Thread/unthread left underwear leg, Pull underwear up/down   Pants- Performed by patient: Thread/unthread right pants leg, Thread/unthread left pants leg, Pull pants up/down                        Lower body assist Assist for lower body dressing: Supervision or verbal cues      Toileting Toileting Toileting activity did not occur: Safety/medical concerns Toileting steps completed by patient: Adjust clothing prior to toileting, Adjust clothing after toileting, Performs perineal hygiene      Toileting assist Assist level: Supervision or verbal cues   Transfers Chair/bed transfer   Chair/bed transfer method: Lateral scoot Chair/bed transfer assist level: Supervision or verbal cues Chair/bed transfer assistive device: Armrests     Locomotion Ambulation Ambulation activity did not occur: Safety/medical concerns         Wheelchair   Type: Manual Max wheelchair distance: 566ft Assist Level: Supervision or verbal cues  Cognition Comprehension Comprehension assist level: Understands basic 90% of the time/cues < 10% of the time  Expression Expression assist level: Expresses basic needs/ideas: With no assist  Social Interaction Social Interaction assist level: Interacts appropriately 90% of the time - Needs monitoring or encouragement for participation or interaction.  Problem Solving Problem solving assist level: Solves complex 90% of the time/cues < 10% of the time  Memory Memory assist level: Recognizes or  recalls 90% of the time/requires cueing < 10% of the time   Medical Problem List and Plan: 1. Gait abnormality secondary to B/l AKA  Cont CIR, making good progress with therapies 2. DVT Prophylaxis/Anticoagulation: Pharmaceutical: Lovenox 3. Pain Management: On lyrica and elavil for management of neuropathy.  Continue oxycodone prn, tramadol prn, will wean as tolerated  -increased lyrica to  BID for phantom limb pain. Limited in titration due to renal status at present  -encouraged patient to use massage and visual feedback 4. Mood: LCSW to follow for evaluation and support.  5. Neuropsych: This patient is capable of making decisions on his own behalf. 6. Skin/Wound Care: Monitor incisions daily for healing. Educate on pressure relief measures.  7. Fluids/Electrolytes/Nutrition: Monitor I/O. Protein supplement to help supplement low stores and to promote healing.  8. Acute renal failure: HD on hold.   BUN/Cr improving.   Nephrology following. Appreciate recs. 9. Anemia:  hgb stable at 10.2 on 7/6. No signs of blood loss 10. Depression with anxiety: Mood appears to be stable. Continue Risperdal and celexa.  11. Dysuria: Improving. UA negative, cx neg 12. Constipation: augmented bowel program.  13. Tobacco abuse: Counsel 14. Transaminitis: Avoid hepatotoxic meds  LOS (Days) 4 A FACE TO FACE EVALUATION WAS PERFORMED  Rekha Hobbins Karis Juba 01/27/2016 7:30 AM

## 2016-01-27 NOTE — Progress Notes (Signed)
Occupational Therapy Session Note  Patient Details  Name: Jeffery Gross MRN: [id number] Date of Birth: 30-Apr-1961  Today's Date: 01/27/2016 OT Individual Time: 1300-1345 OT Individual Time Calculation (min): 45 min    Short Term Goals: Week 1:  OT Short Term Goal 1 (Week 1): Pt will complete UB dressing with Mod I  OT Short Term Goal 2 (Week 1): Pt will complete toilet transfer and tasks with Mod I  OT Short Term Goal 3 (Week 1): Pt will complete LB dressing Mod I bedlevel or w/c level  OT Short Term Goal 4 (Week 1): Pt will complete functional tub bench transfer with supervison  OT Short Term Goal 5 (Week 1): Pt will complete bathing Mod I w/c level, EOB or in shower  Skilled Therapeutic Interventions/Progress Updates: Patient demonstrated good safety awareness while using reacher to pick up items off the floor, reaching into cabinets,e tc.   As well he completed the armitron for 15 minutes on level 7.     Therapy Documentation Precautions:  Precautions Precautions: Fall Restrictions Weight Bearing Restrictions: Yes RLE Weight Bearing: Non weight bearing LLE Weight Bearing: Non weight bearing    Pain:denied   See Function Navigator for Current Functional Status.   Therapy/Group: Individual Therapy  [name], [name] 01/27/2016, 1:40 PM

## 2016-01-27 NOTE — Progress Notes (Signed)
Physical Therapy Session Note  Patient Details  Name: Jeffery Gross MRN: [id number] Date of Birth: 25-May-1961  Today's Date: 01/27/2016 PT Individual Time: [id number] and [id number] PT Individual Time Calculation (min): 56 min and 29 min    Short Term Goals: Week 1:  PT Short Term Goal 1 (Week 1): STG =LTG due to ELOS.   Skilled Therapeutic Interventions/Progress Updates:    Treatment 1: Pt received in w/c & agreeable to PT, noting 6/10 back & BLE pain (RLE > LLE). Session focused on w/c mobility, endurance & BUE strengthening. Pt able to propel w/c around unit with supervision/mod I. Provided pt with 18x18 w/c with anti-tip bars; pt able to transfer w/c>mat table via lateral scoot with supervision & mat table>w/c via anterior/posterior with supervision/set-up. Utilized BUE ergometer up to level 5 for endurance & strength training. Pt performed car transfer set at 28 inch seat height x 2 trials without AD, via lateral scoot with supervision. Discussed d/c plans with pt & need for family ed prior to d/c. At end of session pt left in w/c in room with friend present & all needs within reach.   Treatment 2: Pt received in w/c, noting 4-5/10 back pain but premedicated & pt agreeable to PT. Pt requested to do laundry & was able to propel himself down to laundry room, place clothes in washer & start washer with supervision. Pt requested to go to 6th floor to thank previous nursing staff; pt able to propel w/c with supervision/mod I assist. Educated pt to back on to w/c and pt able to do so with supervision. Discussed pressure relieving techniques & pt reported he had already been educated on them and he was able to perform them. Pt able to propel around 6th floor, then moved on to outside & negotiated ramp with supervision. Pt able to demonstrate anterior weight shift to reduce chance of losing balance posteriorly. At end of session pt returned to unit & was left in w/c.   Therapy Documentation Precautions:   Precautions Precautions: Fall Restrictions Weight Bearing Restrictions: Yes RLE Weight Bearing: Non weight bearing LLE Weight Bearing: Non weight bearing  Pain: Pain Assessment Pain Assessment: 0-10 Pain Score: 6  Pain Location:  (back, BLE) Pain Intervention(s):  (premedicated)   See Function Navigator for Current Functional Status.   Therapy/Group: Individual Therapy  Jeffery Gross 01/27/2016, 9:36 AM

## 2016-01-28 ENCOUNTER — Inpatient Hospital Stay (HOSPITAL_COMMUNITY): Payer: BLUE CROSS/BLUE SHIELD

## 2016-01-28 ENCOUNTER — Encounter (HOSPITAL_COMMUNITY): Payer: BLUE CROSS/BLUE SHIELD

## 2016-01-28 ENCOUNTER — Inpatient Hospital Stay (HOSPITAL_COMMUNITY): Payer: BLUE CROSS/BLUE SHIELD | Admitting: Occupational Therapy

## 2016-01-28 LAB — BASIC METABOLIC PANEL
Anion gap: 11 (ref 5–15)
BUN: 51 mg/dL — AB (ref 6–20)
CO2: 24 mmol/L (ref 22–32)
CREATININE: 2.32 mg/dL — AB (ref 0.61–1.24)
Calcium: 9.5 mg/dL (ref 8.9–10.3)
Chloride: 104 mmol/L (ref 101–111)
GFR, EST AFRICAN AMERICAN: 35 mL/min — AB (ref >60)
GFR, EST NON AFRICAN AMERICAN: 30 mL/min — AB (ref >60)
Glucose, Bld: 104 mg/dL — ABNORMAL HIGH (ref 65–99)
POTASSIUM: 4.1 mmol/L (ref 3.5–5.1)
SODIUM: 139 mmol/L (ref 135–145)

## 2016-01-28 LAB — OCCULT BLOOD X 1 CARD TO LAB, STOOL: FECAL OCCULT BLD: NEGATIVE

## 2016-01-28 NOTE — Progress Notes (Signed)
[place] PHYSICAL MEDICINE & REHABILITATION     PROGRESS NOTE    Subjective/Complaints: C/o getting pain med 2 hrs late yesterday  ROS: Mild pain b/l stumps R>L. Pt denies CP, SOB, N/V/D.  Objective: Vital Signs: Blood pressure 112/70, pulse 71, temperature 98 F (36.7 C), temperature source Oral, resp. rate 18, height  (1.854 m), weight 70.035 kg (154 lb 6.4 oz), SpO2 98 %. No results found. No results for input(s): WBC, HGB, HCT, PLT in the last 72 hours.  Recent Labs  01/26/16 0421 01/27/16 0734  NA 139 139  K 4.4 4.5  CL 103 101  GLUCOSE 94 112*  BUN 57* 55*  CREATININE 2.47* 2.40*  CALCIUM 9.6 9.7   CBG (last 3)  No results for input(s): GLUCAP in the last 72 hours.  Wt Readings from Last 3 Encounters:  01/28/16 70.035 kg (154 lb 6.4 oz)  01/23/16 74.5 kg (164 lb 3.9 oz)  11/16/12 103.42 kg (228 lb)    Physical Exam:  Constitutional:  He appears well-developed and well-nourished. No distress.  HENT: Normocephalic and atraumatic.  Cardiovascular: Normal rate and regular rhythm.No murmur heard. Respiratory: Effort normal and breath sounds normal. No stridor. No respiratory distress. He has no wheezes.  GI: Soft. Bowel sounds are normal. He exhibits no distension. There is no tenderness.  Musculoskeletal: He exhibits edema and tenderness.  Bilateral AKA sensitive to touch R >L (improving).  Neurological: He is alert and oriented.  Motor: B/l UE 5/5 proximal to distal B/l LE: hip flexion 4+/5 proximal (pain inhibition) Sensation intact to light touch  Skin: Skin is warm and dry. He is not diaphoretic.  B/l AKA with sutures in place-clean,dry and intact.  Small amt serous drainage on 4x4 Psychiatric: Very positive. He has a normal mood and affect. His behavior is normal. Judgment and thought content normal.   Assessment/Plan: 1. Mobility and functional deficits secondary to bilateral AKA's/rhabdomyolysis which require 3+ hours per day of  interdisciplinary therapy in a comprehensive inpatient rehab setting. Physiatrist is providing close team supervision and 24 hour management of active medical problems listed below. Physiatrist and rehab team continue to assess barriers to discharge/monitor patient progress toward functional and medical goals.  Function:  Bathing Bathing position   Position: Wheelchair/chair at sink  Bathing parts Body parts bathed by patient: Right arm, Left arm, Chest, Abdomen, Front perineal area, Buttocks, Right upper leg, Left upper leg Body parts bathed by helper: Back  Bathing assist Assist Level: Supervision or verbal cues      Upper Body Dressing/Undressing Upper body dressing   What is the patient wearing?: Pull over shirt/dress     Pull over shirt/dress - Perfomed by patient: Thread/unthread right sleeve, Thread/unthread left sleeve, Put head through opening, Pull shirt over trunk          Upper body assist Assist Level: More than reasonable time      Lower Body Dressing/Undressing Lower body dressing   What is the patient wearing?: Underwear, Pants Underwear - Performed by patient: Thread/unthread right underwear leg, Thread/unthread left underwear leg, Pull underwear up/down   Pants- Performed by patient: Thread/unthread right pants leg, Thread/unthread left pants leg, Pull pants up/down                        Lower body assist Assist for lower body dressing: Supervision or verbal cues      Toileting Toileting Toileting activity did not occur: Safety/medical concerns Toileting steps completed by  patient: Adjust clothing prior to toileting, Adjust clothing after toileting, Performs perineal hygiene      Toileting assist Assist level: Supervision or verbal cues   Transfers Chair/bed transfer   Chair/bed transfer method: Lateral scoot, Anterior/posterior Chair/bed transfer assist level: Supervision or verbal cues Chair/bed transfer assistive device: Armrests      Locomotion Ambulation Ambulation activity did not occur: Safety/medical concerns         Wheelchair   Type: Manual Max wheelchair distance: >300 ft Assist Level: Supervision or verbal cues, No help, No cues, assistive device, takes more than reasonable amount of time  Cognition Comprehension Comprehension assist level: Understands basic 90% of the time/cues < 10% of the time  Expression Expression assist level: Expresses basic needs/ideas: With no assist  Social Interaction Social Interaction assist level: Interacts appropriately 90% of the time - Needs monitoring or encouragement for participation or interaction.  Problem Solving Problem solving assist level: Solves complex 90% of the time/cues < 10% of the time  Memory Memory assist level: Recognizes or recalls 90% of the time/requires cueing < 10% of the time   Medical Problem List and Plan: 1. Gait abnormality secondary to B/l AKA  Cont CIR, PT, OT 2. DVT Prophylaxis/Anticoagulation: Pharmaceutical: Lovenox 3. Pain Management: On lyrica and elavil for management of neuropathy.  Continue oxycodone prn, tramadol prn, will wean as tolerated, pt still using on regular basis  -increased lyrica to 50mg  BID for phantom limb pain. Limited in titration due to renal status at present  -encouraged patient to use massage and visual feedback 4. Mood: LCSW to follow for evaluation and support.  5. Neuropsych: This patient is capable of making decisions on his own behalf. 6. Skin/Wound Care: Monitor incisions daily for healing. Educate on pressure relief measures.  7. Fluids/Electrolytes/Nutrition: Monitor I/O. Protein supplement to help supplement low stores and to promote healing.  8. Acute renal failure: HD on hold.   BUN/Cr improving.   Nephrology following. Appreciate recs. 9. Anemia:  hgb stable at 10.2 on 7/6. No signs of blood loss 10. Depression with anxiety: Mood appears to be stable. Continue Risperdal and celexa.  11.  Dysuria: Improving. UA negative, cx neg 12. Constipation: augmented bowel program.  13. Tobacco abuse: Counsel 14. Transaminitis: Avoid hepatotoxic meds, repeat CMET in am  LOS (Days) 5 A FACE TO FACE EVALUATION WAS PERFORMED  Erick ColaceKIRSTEINS,ANDREW E 01/28/2016 7:35 AM

## 2016-01-28 NOTE — Progress Notes (Signed)
Social Work Patient ID: Jeffery HarbourScott D Spang, male   DOB: Jan 08, 1961, 55 y.o.   MRN: [id number] Mom and helen here for family education with pt. Ramp being built tomorrow for entry into the home. Discussed equipment needs and follow up. Pt is doing what he can to be as independent as possible for home. Will work on discharge needs.

## 2016-01-28 NOTE — Progress Notes (Signed)
Occupational Therapy Session Note  Patient Details  Name: Rip HarbourScott D Randhawa MRN: [id number] Date of Birth: [birth year]  Today's Date: 01/28/2016 OT Individual Time: 1300-1330 OT Individual Time Calculation (min): 30 min   Skilled Therapeutic Interventions/Progress Updates:    Pt completed BUE strengthening with use of the UE ergonometer to increase strength and efficiency for functional transfers and selfcare tasks.  Level 15 used for resistance with RPMs maintained at 29-30 for three, four minute intervals.  He reported level 14 on the perceived exertion scale after completion of the second 4 min set.  HR 87 BPM and with O2 sats 97% on room air.  Pt propelled wheelchair back to the room at independent level.  Pt left in room with call button and phone in reach.    Therapy Documentation Precautions:  Precautions Precautions: Fall Precaution Comments: B LE wound vacs Restrictions Weight Bearing Restrictions: Yes RLE Weight Bearing: Non weight bearing LLE Weight Bearing: Non weight bearing  Pain: Pain Assessment Pain Assessment: 0-10 Pain Score: 2  Pain Type: Surgical pain Pain Location: Leg Pain Orientation: Right Pain Descriptors / Indicators: Throbbing Pain Frequency: Intermittent Pain Onset: Gradual Patients Stated Pain Goal: 4 Pain Intervention(s): Medication (See eMAR) ADL: See Function Navigator for Current Functional Status.   Therapy/Group: Individual Therapy  Johnie Stadel OTR/L 01/28/2016, 3:39 PM

## 2016-01-28 NOTE — Progress Notes (Signed)
Orthopedic Tech Progress Note Patient Details:  Jeffery Gross [place] 1961-01-27 [id number]  Patient ID: Jeffery Gross Goldblatt, male   DOB: 1961-01-27, 55 y.o.   MRN: [id number]   Saul FordyceJennifer C Tami Blass 01/28/2016, 4:24 PMCalled [employer] for bilateral stump shrinkers.

## 2016-01-28 NOTE — Progress Notes (Signed)
Recreational Therapy Session Note  Patient Details  Name: Jeffery Gross MRN: [id number] Date of Birth: [birth year] Today's Date: 01/28/2016  Eval deferred as pt is expected to discharge home in the next couple of days.  Will continue to monitor through team. Ryosuke Ericksen 01/28/2016, 3:26 PM

## 2016-01-28 NOTE — Progress Notes (Signed)
Occupational Therapy Session Note  Patient Details  Name: Jeffery Gross MRN: [id number] Date of Birth: [birth year]  Today's Date: 01/28/2016 OT Individual Time: 0903-1000 OT Individual Time Calculation (min): 57 min    Short Term Goals: Week 1:  OT Short Term Goal 1 (Week 1): Pt will complete UB dressing with Mod I  OT Short Term Goal 2 (Week 1): Pt will complete toilet transfer and tasks with Mod I  OT Short Term Goal 3 (Week 1): Pt will complete LB dressing Mod I bedlevel or w/c level  OT Short Term Goal 4 (Week 1): Pt will complete functional tub bench transfer with supervison  OT Short Term Goal 5 (Week 1): Pt will complete bathing Mod I w/c level, EOB or in shower  Skilled Therapeutic Interventions/Progress Updates:    Pt completed grooming, toileting, and shower tub transfers during session.  Modified independent level for toilet transfer and toileting in his room with grab bar present.  Discussed setup of his ex-wife's residence and need to practice with how hers is arranged.  He will get information from her so we can practice closest setup as possible.  Pt completed transfer to tub bench with modified independent level in the ADL apartment as well.  Discussed possibility of having to pay for the tub bench as well.  He completed shaving from wheelchair at the sink with modified independence after return to room.  Pt able to propel wheelchair independently to and from the gym.    Therapy Documentation Precautions:  Precautions Precautions: Fall Restrictions Weight Bearing Restrictions: Yes RLE Weight Bearing: Non weight bearing LLE Weight Bearing: Non weight bearing  Pain: Pain Assessment Pain Assessment: No/denies pain ADL: See Function Navigator for Current Functional Status.   Therapy/Group: Individual Therapy  Corlette Ciano OTR/L 01/28/2016, 10:08 AM

## 2016-01-28 NOTE — Progress Notes (Signed)
Physical Therapy Note  Patient Details  Name: Jeffery Gross MRN: [id number] Date of Birth: 08-Jul-1961 Today's Date: 7/10/20170800-0900, 60 min individual Pain: AM- unrated, bil residual limbs, R>L.  Premedicated. PM- 6/10, back , premedicated Tx 1:  Pt education on compression wrapping.  Pt issued hands out for wrapping, and several amputee support magazines and discussed Amputee Support Group.  Pt reported his back really hurts after sitting up in w/c for more than an hour.    Using 2 6" wide ACEs, PT placed 4 x 4 over incisions, then wrapped pt's residual limbs, with pt in supine, holding each LE vertically in air. Pt declined waist wrap due to back pain.  MD informed pt that he will order shrinkers today. Basic bed> w/c> mat transfers, supervision with safety cues due to brake "popping off" as he placed w/c too close to transfer surface.  Distant supervision w/c propulsion on unit to return to room to await OT.  PT advised pt to remain in w/c.  tx 2:  Family measured surfaces at home; work sheet given to treating OT. Community w/c propulsion on level tile, slightly sloping sidewalk, supervision.  Family ed with mother,  for lateral scoot transfers x 3 in/out with supervision. .  Pt impulsive, verbose.   Actual car transfer into mother's SUV.  Also educated mother on w/c parts mgt, folding w/c and placing in back of SUV. Ex wife and step father observed.  Pt has back pain due to previous injury and rod placement in back. Pt with increased back pain at end of session, to 8/10, increased apparently due to long distance w/c propulsion.   PT called and left a message for Josh at [facility] regarding possibility of a w/c eval before pt discharges, due to premorbid back hx and pain with sitting up in w/c and propelling standard manual w/c.  Pt performed lateral scoot transfer w/c> bed and left resting in bed with family present.  PT spoke with RN about pt's increased pain.  Tiki Tucciarone 01/28/2016, 7:56 AM

## 2016-01-28 NOTE — Progress Notes (Signed)
Social Work Patient ID: Jeffery Gross, male   DOB: 06/01/1961, 55 y.o.   MRN: [id number] Team feels pt will have reached his goals by Wed and want to discharge this day. Will check with MD and make sure no medical issues. Family came in for training Monday and ramp being built tomorrow. Work on discharge needs.

## 2016-01-29 ENCOUNTER — Inpatient Hospital Stay (HOSPITAL_COMMUNITY): Payer: BLUE CROSS/BLUE SHIELD | Admitting: Physical Therapy

## 2016-01-29 ENCOUNTER — Inpatient Hospital Stay (HOSPITAL_COMMUNITY): Payer: BLUE CROSS/BLUE SHIELD | Admitting: *Deleted

## 2016-01-29 ENCOUNTER — Inpatient Hospital Stay (HOSPITAL_COMMUNITY): Payer: BLUE CROSS/BLUE SHIELD | Admitting: Occupational Therapy

## 2016-01-29 LAB — BASIC METABOLIC PANEL
ANION GAP: 11 (ref 5–15)
BUN: 45 mg/dL — ABNORMAL HIGH (ref 6–20)
CALCIUM: 9.7 mg/dL (ref 8.9–10.3)
CO2: 27 mmol/L (ref 22–32)
Chloride: 101 mmol/L (ref 101–111)
Creatinine, Ser: 2.24 mg/dL — ABNORMAL HIGH (ref 0.61–1.24)
GFR, EST AFRICAN AMERICAN: 36 mL/min — AB (ref >60)
GFR, EST NON AFRICAN AMERICAN: 31 mL/min — AB (ref >60)
GLUCOSE: 100 mg/dL — AB (ref 65–99)
POTASSIUM: 4.4 mmol/L (ref 3.5–5.1)
Sodium: 139 mmol/L (ref 135–145)

## 2016-01-29 MED ORDER — CLONAZEPAM 0.5 MG PO TABS
0.5000 mg | ORAL_TABLET | Freq: Two times a day (BID) | ORAL | Status: DC | PRN
  Administered 2016-01-29: 0.5 mg via ORAL
  Filled 2016-01-29: qty 1

## 2016-01-29 MED ORDER — RISPERIDONE 1 MG PO TABS
1.0000 mg | ORAL_TABLET | Freq: Every day | ORAL | Status: DC
  Administered 2016-01-29: 1 mg via ORAL
  Filled 2016-01-29 (×2): qty 1

## 2016-01-29 MED ORDER — DEXTROSE 50 % IV SOLN
1.0000 | Freq: Once | INTRAVENOUS | Status: DC
Start: 2016-01-29 — End: 2016-01-29

## 2016-01-29 NOTE — Plan of Care (Signed)
Problem: Food- and Nutrition-Related Knowledge Deficit (NB-1.1) Goal: Nutrition education Formal process to instruct or train a patient/client in a skill or to impart knowledge to help patients/clients voluntarily manage or modify food choices and eating behavior to maintain or improve health. Outcome: Completed/Met Date Met:  01/29/16 Nutrition Education Note  RD consulted for diet education. Provided "Eating Healthy with Kidney Disease" kidney diet handout. Noted pt is not on HD. HD cath had been removed. Pt was educated to limit foods that may harm the kidney if consumed in large amounts. Reviewed food groups and provided written recommended serving sizes specifically determined for patient's current nutritional status.   Explained why diet restrictions are needed and provided lists of foods to limit/avoid that are high potassium, sodium, and phosphorus. Provided specific recommendations on safer alternatives of these foods. Discussed importance of protein intake at each meal and snack.  Teach back method used.  Expect fair compliance.  Body mass index is 20.41 kg/(m^2). Pt meets criteria for normal based on current BMI.  Current diet order is renal, patient is consuming approximately 100% of meals at this time. Labs and medications reviewed. No further nutrition interventions warranted at this time. RD contact information provided. If additional nutrition issues arise, please re-consult RD. Plans for discharge tomorrow.  Corrin Parker, MS, RD, LDN Pager # [phone] After hours/ weekend pager # [phone]

## 2016-01-29 NOTE — Consult Note (Signed)
  INITIAL DIAGNOSTIC EVALUATION - [facility]   MEDICAL NECESSITY:  Jeffery Gross [facility] for an initial diagnostic evaluation owing to the patient's diagnosis of bilateral above-the-knee amputations.   During today's visit, Jeffery Gross denied experiencing any significant cognitive difficulties with the exception of lifelong attention problems. From an emotional standpoint, he has a history of severe depression and anxiety secondary to several social issues. In fact, his depression was so bad that he endorsed suicidal ideation that occurred within the past year. He is treated for mood symptoms with citalopram and risperidone that he finds to be helpful. These medications are prescribed via his PCP. He [facility] that he did not particularly find beneficial. Jeffery Gross admitted to participation in a 30 day rehabilitation program several years ago for cocaine abuse. He described his current mood as "great" and said that his depression has completely abated given that his social stressors have resolved and he has more direction in his life. No adjustment issues endorsed. Suicidal/homicidal ideation, plan or intent was denied. No manic or hypomanic episodes were reported. The patient denied ever experiencing any auditory/visual hallucinations. No major behavioral or personality changes were endorsed.   Patient feels that progress is being made in therapy. No barriers to therapy identified. Jeffery Gross described dissatisfaction with how certain medications are being distributed (i.e., not being given in a timely manner). Otherwise, he had no major issues and has been satisfied with the rehabilitation staff. With regard to his social life, he plans to discharge home with his first ex-wife.   PROCEDURES: [1 unit 90791] Diagnostic clinical interview  Review of available  records   IMPRESSION: Overall, Jeffery Gross denied experiencing any significant cognitive difficulties and no overt issues were observed. Emotionally, while he has a history of severe depression and anxiety, he is currently in excellent spirits and appears to be adjusting well to his unfortunate medical circumstances. Regardless, continued treatment with his psychopharmacologic medications is warranted. I do not feel that neuropsychology needs to follow-up with him for the remainder of this admission unless requested by the patient or rehabilitation staff.    Debbe MountsAdam T. Elianah Karis, Psy.D., ABN Board-Certified Clinical Neuropsychologist

## 2016-01-29 NOTE — Progress Notes (Signed)
Social Work  Discharge Note  The overall goal for the admission was met for:   Discharge location: [place]  Length of Stay: Yes-7 DAYS  Discharge activity level: Yes-MOD/I WHEELCHAIR LEVEL  Home/community participation: Yes  Services provided included: MD, RD, PT, OT, RN, CM, TR, Pharmacy, Neuropsych and SW  Financial Services: Private Insurance: [employer]  Follow-up services arranged: Home Health: [place], DME: [facility], [place] and Patient/Family has no preference for HH/DME agencies  Comments (or additional information):PT DID WELL AND REACHED MOD/I WHEELCHAIR LEVEL, HELEN TO BE THERE IN THE EVENINGS. PT TO FOLLOW UP WITH HIS DISABILITY APPLICATION WHICH WAS STARTED MONTHS BEFORE HOSPITALIZED AND WILL APPLY FOR MEDICAID. PT FEELS COMFORTABLE WITH DISCHARGE AND FAMILY WAS HERE FOR TRAINING.  Patient/Family verbalized understanding of follow-up arrangements: Yes  Individual responsible for coordination of the follow-up plan: SELF & HELEN-EX-WIFE  Confirmed correct DME delivered: Elease Hashimoto 01/29/2016    Elease Hashimoto

## 2016-01-29 NOTE — Progress Notes (Signed)
Physical Therapy Discharge Summary  Patient Details  Name: Jeffery Gross MRN: [id number] Date of Birth: 10/14/1960  Today's Date: 01/29/2016 PT Individual Time: 8250-5397 PT Individual Time Calculation (min): 60 min   PT treatment: Pt performed w/c mobility in home and controlled environments including ramp with mod I. Pt performed car transfer without sliding board with mod I.  Mod I for furniture transfers to a variety of surfaces.  Supine and sidelying therex for LE strengthening and ROM, pt able to perform with minimal cuing from PT.  Attempt to bump up to 8'' step, pt with difficulty, pt able to bump up to 6'' step. PT suggested making a 6'' and 12'' step to use to bump into w/c in case of fall, pt verbalizes understanding.   PT rewrapped pt's residual limbs, spoke with MD who states he has ordered shrinkers for pt use at home. Pt educated on home set up for safety and energy conservation, verbalizes understanding.  Pt states he feels ready for d/c home tomorrow.  Patient has met 7 of 7 long term goals due to improved activity tolerance, improved balance, improved postural control, increased strength, increased range of motion, decreased pain and ability to compensate for deficits.  Patient to discharge at a wheelchair level Modified Independent.   Patient's care partner is independent to provide the necessary supervision, set up assistance at discharge.  Reasons goals not met: n/a  Recommendation:  Patient will benefit from ongoing skilled PT services in home health setting to continue to advance safe functional mobility, address ongoing impairments in strength, ROM, and minimize fall risk.  Equipment: w/c, sliding board  Reasons for discharge: treatment goals met and discharge from hospital  Patient/family agrees with progress made and goals achieved: Yes  PT Discharge Precautions/Restrictions Precautions Precautions: Fall Pain Pain Assessment Pain Assessment: 0-10 Pain Score:  6  Pain Type: Surgical pain Pain Location: Leg Pain Orientation: Right Pain Descriptors / Indicators: Aching Pain Frequency: Intermittent Pain Onset: Gradual Pain Intervention(s): RN made aware   Cognition Overall Cognitive Status: Within Functional Limits for tasks assessed Sensation Sensation Light Touch: Appears Intact Proprioception: Appears Intact Additional Comments: Patient reports intermintent phantom sensation and phantom pain.  Coordination Gross Motor Movements are Fluid and Coordinated: Yes Fine Motor Movements are Fluid and Coordinated: Yes Motor  Motor Motor: Within Functional Limits   Trunk/Postural Assessment  Cervical Assessment Cervical Assessment: Within Functional Limits Thoracic Assessment Thoracic Assessment: Within Functional Limits Lumbar Assessment Lumbar Assessment: Within Functional Limits Postural Control Postural Control: Within Functional Limits  Balance Dynamic Sitting Balance Dynamic Sitting - Level of Assistance: 6: Modified independent (Device/Increase time) Extremity Assessment      RLE Assessment RLE Assessment: Within Functional Limits LLE Assessment LLE Assessment: Within Functional Limits   See Function Navigator for Current Functional Status.  [name] 01/29/2016, 8:46 AM

## 2016-01-29 NOTE — Progress Notes (Signed)
Occupational Therapy Discharge Summary  Patient Details  Name: Jeffery Gross MRN: [id number] Date of Birth: August 21, 1960  Today's Date: 01/29/2016 OT Individual Time: 1300-1425 OT Individual Time Calculation (min): 85 min    Session Note:  Pt propelled wheelchair to and from the bathroom to complete toilet transfer and simulated toileting with modified independent level.  He was able to transfer back to the wheelchair and then wash his hands at the sink with modified independent level as well.  He was able to roll his wheelchair down to the gift shop and maneuver around obstacles with modified independence as well.  Had him also propel around outside on the walkway before returning up to rehab.  Pt independent with all wheelchair mobility.  Completed UE strengthening using Ergonometer for 2 intervals of 5 mins each.  Had pt maintain intensity of 30 RPMS with resistance set on level 15.  Pt returned to room after completion of this task and transferred to the bed.  He was able to re-wrap his LEs with ace bandage with increased time and modified independence following handout given earlier in the stay.  Pt left in bed with call button and phone in reach.      Patient has met 8 of 8 long term goals due to improved balance and ability to compensate for deficits.  Patient to discharge at overall Modified Independent level.  Patient's care partner is independent to provide the necessary physical assistance at discharge.    Reasons goals not met: NA  Recommendation:  Patient will benefit from ongoing skilled OT services in home health setting to continue to advance functional skills in the area of BADL, iADL, Vocation and Reduce care partner burden.  Equipment: tub bench, drop arm commode  Reasons for discharge: treatment goals met and discharge from hospital  Patient/family agrees with progress made and goals achieved: Yes  OT Discharge Precautions/Restrictions  Precautions Precautions:  Fall Precaution Comments:   Restrictions Weight Bearing Restrictions: Yes RLE Weight Bearing: Non weight bearing LLE Weight Bearing: Non weight bearing  Pain Pain Assessment Pain Assessment: Faces Pain Score: 7  Faces Pain Scale: Hurts a little bit Pain Type: Surgical pain Pain Location: Leg Pain Orientation: Right;Left Pain Descriptors / Indicators: Discomfort Pain Intervention(s): Repositioned;Emotional support ADL  See Function Section of chart  Vision/Perception  Vision- History Baseline Vision/History: Wears glasses Wears Glasses: Reading only Patient Visual Report: No change from baseline Vision- Assessment Vision Assessment?: No apparent visual deficits  Cognition Overall Cognitive Status: Within Functional Limits for tasks assessed Safety/Judgment: Appears intact Sensation Sensation Light Touch: Appears Intact Stereognosis: Appears Intact Hot/Cold: Appears Intact Proprioception: Appears Intact Coordination Gross Motor Movements are Fluid and Coordinated: Yes Fine Motor Movements are Fluid and Coordinated: Yes Motor  Motor Motor: Within Functional Limits Mobility  Bed Mobility Supine to Sit: 6: Modified independent (Device/Increase time) Sitting - Scoot to Edge of Bed: 6: Modified independent (Device/Increase time) Sit to Supine: 6: Modified independent (Device/Increase time)  Trunk/Postural Assessment  Cervical Assessment Cervical Assessment: Within Functional Limits Thoracic Assessment Thoracic Assessment: Within Functional Limits Lumbar Assessment Lumbar Assessment: Within Functional Limits Postural Control Postural Control: Within Functional Limits  Balance Balance Balance Assessed: Yes Static Sitting Balance Static Sitting - Level of Assistance: 6: Modified independent (Device/Increase time) Dynamic Sitting Balance Dynamic Sitting - Level of Assistance: 6: Modified independent (Device/Increase time) Extremity/Trunk Assessment RUE  Assessment RUE Assessment: Within Functional Limits LUE Assessment LUE Assessment: Within Functional Limits   See Function Navigator for Current Functional Status.  Jemel Ono OTR/L 01/29/2016, 3:57 PM

## 2016-01-29 NOTE — Progress Notes (Signed)
[facility] PHYSICAL MEDICINE & REHABILITATION     PROGRESS NOTE    Subjective/Complaints: C/o getting pain med 2.r hrs late last night  ROS: no stump pain currently. Pt denies CP, SOB, N/V/D.  Objective: Vital Signs: Blood pressure 106/67, pulse 63, temperature 98.6 F (37 C), temperature source Oral, resp. rate 18, height 6\' 1"  (1.854 m), weight 70.171 kg (154 lb 11.2 oz), SpO2 97 %. No results found. No results for input(s): WBC, HGB, HCT, PLT in the last 72 hours.  Recent Labs  01/28/16 0646 01/29/16 0425  NA 139 139  K 4.1 4.4  CL 104 101  GLUCOSE 104* 100*  BUN 51* 45*  CREATININE 2.32* 2.24*  CALCIUM 9.5 9.7   CBG (last 3)  No results for input(s): GLUCAP in the last 72 hours.  Wt Readings from Last 3 Encounters:  01/29/16 70.171 kg (154 lb 11.2 oz)  01/23/16 74.5 kg (164 lb 3.9 oz)  11/16/12 103.42 kg (228 lb)    Physical Exam:  Constitutional:  He appears well-developed and well-nourished. No distress.  HENT: Normocephalic and atraumatic.  Cardiovascular: Normal rate and regular rhythm.No murmur heard. Respiratory: Effort normal and breath sounds normal. No stridor. No respiratory distress. He has no wheezes.  GI: Soft. Bowel sounds are normal. He exhibits no distension. There is no tenderness.  Musculoskeletal: He exhibits edema and tenderness.  Bilateral AKA sensitive to touch R >L (improving).  Neurological: He is alert and oriented.  Motor: B/l UE 5/5 proximal to distal B/l LE: hip flexion 4+/5 proximal (pain inhibition) Sensation intact to light touch  Skin: Skin is warm and dry. He is not diaphoretic.  B/l AKA with sutures in place-clean,dry and intact.  Small amt serous drainage on 4x4 Psychiatric: Very positive. He has a normal mood and affect. His behavior is normal. Judgment and thought content normal.   Assessment/Plan: 1. Mobility and functional deficits secondary to bilateral AKA's/rhabdomyolysis which require 3+ hours per day of  interdisciplinary therapy in a comprehensive inpatient rehab setting. Physiatrist is providing close team supervision and 24 hour management of active medical problems listed below. Physiatrist and rehab team continue to assess barriers to discharge/monitor patient progress toward functional and medical goals.  Function:  Bathing Bathing position   Position: Wheelchair/chair at sink  Bathing parts Body parts bathed by patient: Right arm, Left arm, Chest, Abdomen, Front perineal area, Buttocks, Right upper leg, Left upper leg Body parts bathed by helper: Back  Bathing assist Assist Level: Supervision or verbal cues      Upper Body Dressing/Undressing Upper body dressing   What is the patient wearing?: Pull over shirt/dress     Pull over shirt/dress - Perfomed by patient: Thread/unthread right sleeve, Thread/unthread left sleeve, Put head through opening, Pull shirt over trunk          Upper body assist Assist Level: More than reasonable time      Lower Body Dressing/Undressing Lower body dressing   What is the patient wearing?: Underwear, Pants Underwear - Performed by patient: Thread/unthread right underwear leg, Thread/unthread left underwear leg, Pull underwear up/down   Pants- Performed by patient: Thread/unthread right pants leg, Thread/unthread left pants leg, Pull pants up/down                        Lower body assist Assist for lower body dressing: Supervision or verbal cues      Toileting Toileting Toileting activity did not occur: Safety/medical concerns Toileting steps completed by  patient: Adjust clothing prior to toileting, Adjust clothing after toileting, Performs perineal hygiene      Toileting assist Assist level: More than reasonable time   Transfers Chair/bed transfer   Chair/bed transfer method: Lateral scoot Chair/bed transfer assist level: Supervision or verbal cues Chair/bed transfer assistive device: Armrests      Locomotion Ambulation Ambulation activity did not occur: Safety/medical concerns         Wheelchair   Type: Manual Max wheelchair distance: 300 Assist Level: Supervision or verbal cues, No help, No cues, assistive device, takes more than reasonable amount of time  Cognition Comprehension Comprehension assist level: Follows complex conversation/direction with extra time/assistive device  Expression Expression assist level: Expresses complex ideas: With extra time/assistive device  Social Interaction Social Interaction assist level: Interacts appropriately with others - No medications needed.  Problem Solving Problem solving assist level: Solves complex problems: Recognizes & self-corrects  Memory Memory assist level: Complete Independence: No helper   Medical Problem List and Plan: 1. Gait abnormality secondary to B/l AKA  Cont CIR, PT, OT, plan D/C in am 2. DVT Prophylaxis/Anticoagulation: Pharmaceutical: Lovenox 3. Pain Management: On lyrica and elavil for management of neuropathy.  Continue oxycodone prn, tramadol prn, will wean as tolerated, pt still using on regular basis  -increased lyrica to  BID for phantom limb pain. Limited in titration due to renal status at present  -encouraged patient to use massage and visual feedback 4. Mood: LCSW to follow for evaluation and support.  5. Neuropsych: This patient is capable of making decisions on his own behalf. 6. Skin/Wound Care: Monitor incisions daily for healing. Educate on pressure relief measures.  7. Fluids/Electrolytes/Nutrition: Monitor I/O. Protein supplement to help supplement low stores and to promote healing.  8. Acute renal failure: HD on hold.   BUN/Cr improving.   Nephrology following. Appreciate recs. 9. Anemia:  hgb stable at 10.2 on 7/6. No signs of blood loss 10. Depression with anxiety: Mood appears to be stable. Continue Risperdal and celexa.  11. Dysuria: Improving. UA negative, cx neg 12.  Constipation: augmented bowel program.  13. Tobacco abuse: Counsel 14. Transaminitis: Avoid hepatotoxic meds, repeat CMET in am  LOS (Days) 6 A FACE TO FACE EVALUATION WAS PERFORMED  Jeffery Gross 01/29/2016 7:20 AM

## 2016-01-30 LAB — HEPATIC FUNCTION PANEL
ALT: 16 U/L — ABNORMAL LOW (ref 17–63)
AST: 20 U/L (ref 15–41)
Albumin: 3.3 g/dL — ABNORMAL LOW (ref 3.5–5.0)
Alkaline Phosphatase: 119 U/L (ref 38–126)
Total Bilirubin: 0.5 mg/dL (ref 0.3–1.2)
Total Protein: 6.2 g/dL — ABNORMAL LOW (ref 6.5–8.1)

## 2016-01-30 LAB — BASIC METABOLIC PANEL
Anion gap: 10 (ref 5–15)
BUN: 42 mg/dL — AB (ref 6–20)
CALCIUM: 9.6 mg/dL (ref 8.9–10.3)
CO2: 27 mmol/L (ref 22–32)
CREATININE: 2.19 mg/dL — AB (ref 0.61–1.24)
Chloride: 103 mmol/L (ref 101–111)
GFR calc Af Amer: 37 mL/min — ABNORMAL LOW (ref >60)
GFR, EST NON AFRICAN AMERICAN: 32 mL/min — AB (ref >60)
GLUCOSE: 95 mg/dL (ref 65–99)
Potassium: 4.2 mmol/L (ref 3.5–5.1)
Sodium: 140 mmol/L (ref 135–145)

## 2016-01-30 MED ORDER — TRAMADOL HCL 50 MG PO TABS: 50.0000 mg | ORAL_TABLET | Freq: Four times a day (QID) | ORAL | Status: DC | PRN

## 2016-01-30 MED ORDER — SENNOSIDES-DOCUSATE SODIUM 8.6-50 MG PO TABS: 2.0000 | ORAL_TABLET | Freq: Every day | ORAL | Status: DC

## 2016-01-30 MED ORDER — RISPERIDONE 1 MG PO TABS: 1.0000 mg | ORAL_TABLET | Freq: Every day | ORAL | Status: DC

## 2016-01-30 MED ORDER — OXYCODONE HCL 5 MG PO TABS: 5.0000 mg | ORAL_TABLET | ORAL | Status: DC | PRN

## 2016-01-30 MED ORDER — OXYCODONE HCL 10 MG PO TABS: 5.0000 mg | ORAL_TABLET | Freq: Four times a day (QID) | ORAL | Status: DC | PRN

## 2016-01-30 MED ORDER — TRAMADOL HCL 50 MG PO TABS
50.0000 mg | ORAL_TABLET | Freq: Four times a day (QID) | ORAL | Status: DC | PRN
Start: 2016-01-30 — End: 2016-06-02

## 2016-01-30 MED ORDER — METHOCARBAMOL 500 MG PO TABS: 500.0000 mg | ORAL_TABLET | Freq: Four times a day (QID) | ORAL | Status: DC | PRN

## 2016-01-30 MED ORDER — PREGABALIN 50 MG PO CAPS: 50.0000 mg | ORAL_CAPSULE | Freq: Every day | ORAL | Status: DC

## 2016-01-30 MED ORDER — ALPRAZOLAM 0.5 MG PO TABS: 0.5000 mg | ORAL_TABLET | Freq: Every evening | ORAL | Status: DC | PRN

## 2016-01-30 MED ORDER — AMITRIPTYLINE HCL 50 MG PO TABS: 50.0000 mg | ORAL_TABLET | Freq: Every day | ORAL | Status: DC

## 2016-01-30 MED ORDER — PREGABALIN 50 MG PO CAPS: 50.0000 mg | ORAL_CAPSULE | Freq: Two times a day (BID) | ORAL | Status: DC

## 2016-01-30 MED ORDER — CITALOPRAM HYDROBROMIDE 40 MG PO TABS: 40.0000 mg | ORAL_TABLET | Freq: Every day | ORAL | Status: DC

## 2016-01-30 MED ORDER — PANTOPRAZOLE SODIUM 40 MG PO TBEC: 40.0000 mg | DELAYED_RELEASE_TABLET | Freq: Every day | ORAL | Status: DC

## 2016-01-30 NOTE — Progress Notes (Signed)
Pt discharged to home with discharge instructions and all belongings.

## 2016-01-30 NOTE — Discharge Instructions (Signed)
Inpatient Rehab Discharge Instructions  Rip HarbourScott D Boedecker Discharge date and time: 01/30/16   Activities/Precautions/ Functional Status: Activity: no lifting, driving, or strenuous exercise for till cleared by MD Diet: renal diet Wound Care: keep wound clean and dry. Contact MD if you develop any problems with your incision/wound--redness, swelling, increase in pain, drainage or if you develop fever or chills.   Functional status:  ___ No restrictions     ___ Walk up steps independently ___ 24/7 supervision/assistance   ___ Walk up steps with assistance _X__ Intermittent supervision/assistance  ___ Bathe/dress independently ___ Walk with walker     _X__ Bathe/dress with assistance as needed ___ Walk Independently    ___ Shower independently ___ Walk with assistance    ___ Shower with assistance _X__ No alcohol     ___ Return to work/school ________  Special Instructions:  1. NO alcohol. NO smoking.  2. Wean Oxycodone to every 6 hours as needed for severe pain. REFILLS ON PAIN MEDICATIONS BY DR. DUDA  OR YOUR PRIMARY CARE PHYSICIAN.   COMMUNITY REFERRALS UPON DISCHARGE:    Home Health:   PT & RN   Agency:[facility] Phone:[phone]   Date of last service:01/30/2016  Medical Equipment/Items Ordered:WHEELCHAIR, TUB BENCH, DROP ARM BEDSIDE COMMODE & 30 TRANSFER BOARD  Agency/Supplier:[facility]  [phone] Other:APPLY FOR FOOD STAMPS AND MEDICAID-DISABILITY PENDING  GENERAL COMMUNITY RESOURCES FOR PATIENT/FAMILY: Support Groups:[employer] SUPPORT GROUP FIRST Tuesday OF EACH MONTH @ 7:00-8:30 PM AT THE [facility] QUESTIONS CONTACT Vladimir FasterROBIN WALDRON [phone]    My questions have been answered and I understand these instructions. I will adhere to these goals and the provided educational materials after my discharge from the hospital.  Patient/Caregiver Signature _______________________________ Date __________  Clinician Signature  _______________________________________ Date __________  Please bring this form and your medication list with you to all your follow-up doctor's appointments.

## 2016-01-30 NOTE — Patient Care Conference (Signed)
Inpatient RehabilitationTeam Conference and Plan of Care Update Date: 01/30/2016   Time: 11:00 am    Patient Name: Jeffery Gross      Medical Record Number: [id number]  Date of Birth: June 27, 1961 Sex: Male         Room/Bed: 4M08C/4M08C-02 Payor Info: Payor: BLUE CROSS BLUE SHIELD / Plan: BCBS OTHER / Product Type: *No Product type* /    Admitting Diagnosis: AKA  Admit Date/Time:  01/23/2016  5:33 PM Admission Comments: No comment available   Primary Diagnosis:  S/P AKA (above knee amputation) bilateral (HCC) Principal Problem: S/P AKA (above knee amputation) bilateral [facility])  Patient Active Problem List   Diagnosis Date Noted  . Post-operative pain   . Ischemia of lower extremity 01/23/2016  . Hypoalbuminemia due to protein-calorie malnutrition ([place])   . Dysuria   . Tobacco abuse   . Constipation due to pain medication   . Chronic low back pain   . Benign essential HTN   . Asthma   . Hyponatremia   . Acute blood loss anemia   . CKD (chronic kidney disease)   . Anemia of chronic disease   . Transaminitis   . Phantom limb syndrome with pain ([place])   . S/P AKA (above knee amputation) bilateral ([place])   . Acute kidney injury ([place])   . Syncope   . Pressure ulcer 01/12/2016  . Fall   . Encounter for central line placement   . Urinary retention   . AKI (acute kidney injury) ([place])   . Traumatic rhabdomyolysis ([place])   . Rhabdomyolysis 01/03/2016  . Nontraumatic compartment syndrome of leg 01/03/2016  . Chest pain 10/27/2012  . HTN (hypertension) 10/27/2012  . Back pain 10/27/2012    Expected Discharge Date: Expected Discharge Date: 01/30/16  Team Members Present: Physician leading conference: Dr. Alysia Penna Social Worker Present: Ovidio Kin, LCSW Nurse Present: Heather Roberts, RN PT Present: Georjean Mode, PT OT Present: Clyda Greener, OT SLP Present: Windell Moulding, SLP PPS Coordinator present : Daiva Nakayama, RN, CRRN     Current Status/Progress Goal Weekly Team Focus   Medical   pain issues with med timing, stumps healing well  home with Mod I  D/C planning   Bowel/Bladder        continent with B & B     Swallow/Nutrition/ Hydration             ADL's   met all LTGs  modified independent overall  DME, family ed, HEP   Mobility   met all LTGs  modified independent at w/c level  DME, family and pt ed   Communication             Safety/Cognition/ Behavioral Observations            Pain        managed with medication     Skin        surgical wounds-wrapping with ace wraps-educated pt on   [facility] to check     *See Care Plan and progress notes for long and short-term goals.  Barriers to Discharge: none    Possible Resolutions to Barriers:  D/C today    Discharge Planning/Teaching Needs:    Home to ex-wife's home where she is there in the evenings. Pt is mod/i wheelchair level. Met his goals and ready for DC     Team Discussion:  Pain managed decreasing pain meds. Reached goals of mod/i wheelchair level. Family training completed. Will have follow up therapies-home health.  Revisions to Treatment Plan:  DC Today   Continued Need for Acute Rehabilitation Level of Care: The patient requires daily medical management by a physician with specialized training in physical medicine and rehabilitation for the following conditions: Daily direction of a multidisciplinary physical rehabilitation program to ensure safe treatment while eliciting the highest outcome that is of practical value to the patient.: Yes Daily medical management of patient stability for increased activity during participation in an intensive rehabilitation regime.: Yes Daily analysis of laboratory values and/or radiology reports with any subsequent need for medication adjustment of medical intervention for : Post surgical problems  Elease Hashimoto 01/30/2016, 2:34 PM

## 2016-01-30 NOTE — Discharge Summary (Signed)
Physician Discharge Summary  Patient ID: Jeffery Gross MRN: [id number] DOB/AGE: [birth year] 55 y.o.  Admit date: 01/23/2016 Discharge date: 01/30/2016  Discharge Diagnoses:  Principal Problem:   S/P AKA (above knee amputation) bilateral (HCC) Active Problems:   Rhabdomyolysis   AKI (acute kidney injury) (HCC)   Phantom limb syndrome with pain [facility])   Post-operative pain   Discharged Condition:  Stable    Labs:  Basic Metabolic Panel:  Recent Labs Lab 01/24/16 0604 01/25/16 1191 01/26/16 0421 01/27/16 0734 01/28/16 0646 01/29/16 0425 01/30/16 0516  NA 140 140 139 139 139 139 140  K 4.1 4.2 4.4 4.5 4.1 4.4 4.2  CL 102 102 103 101 104 101 103  CO2 28 26 25 26 24 27 27   GLUCOSE 98 96 94 112* 104* 100* 95  BUN 69* 62* 57* 55* 51* 45* 42*  CREATININE 3.36* 2.67* 2.47* 2.40* 2.32* 2.24* 2.19*  CALCIUM 9.4 9.5 9.6 9.7 9.5 9.7 9.6    CBC:  Recent Labs Lab 01/24/16 0604  WBC 6.9  NEUTROABS 3.3  HGB 10.2*  HCT 32.0*  MCV 87.7  PLT 240    CBG: No results for input(s): GLUCAP in the last 168 hours.  Brief HPI:   : Jeffery Gross is a 55-y.o. male with PMH of DDD, chronic back pain, anxiety with depression, HTN who was admitted on 01/03/16 after fall due to presumed syncope and was on the floor for 2-3 days with inability to walk. He was found to have rhabdomyolysis with acute renal failure, BLE DVT, and muscle necrosis of RLE with bilateral compartment syndrome. He was started on dialysis for nonoliguric AKI due to ATN and nephrology has been following for input. He has required fasciotomy with multiple I & D procedures due to attempts at limb salvage. He underewent B-AKA on 01/09/16 by Dr. Lajoyce Corners. ABLA managed with transfusion and IV iron to supllement low iron stores. Right IJ Tunneled HD cath placed on 06/30 by IVR/Dr. Deanne Coffer. He is showing some signs of renal recovery with improvement in UOP and HD was discontinued on 07/01 and nephrology signed off with  recommendations to follow up on outpatient basis. His pain control had improved with good participation in therapy.  CIR was  recommended for follow up therapy.    Hospital Course: Jeffery Gross was admitted to rehab 01/23/2016 for inpatient therapies to consist of PT and OT at least three hours five days a week. Past admission physiatrist, therapy team and rehab RN have worked together to provide customized collaborative inpatient rehab. Renal status was monitored closely showing steady improvement and IJ was removed.  His po intake has been good and he is voiding without difficulty.  Follow up CBC showed H/H to be stable at 10.2. Blood pressures were monitored on bid basis and have been well controlled. Bilateral AKA incision are clean, dry and intact with min edema but no signs of infection.  He was fitted with stump shrinkers by [employer] prior to discharge.  His mood has been stable on risperdal at nights and low dose Xanax daily prn. Oxycodone was tapered to 5 mg every 4 hours prn and he has been educated on desensitization techniques to help manage neuropathy.  He was educated on need to limit opioids and to further taper oxycodone to every 6 hours prn.  Appointments have been made for follow up with [facility] outpatient Clinic as well as nephrology for medical management. He has made good progress and is modified independent at wheelchair  level. He will continue to receive follow up HHPT and HHRN by [facility] after discharge.    Rehab course: During patient's stay in rehab weekly team conferences were held to monitor patient's progress, set goals and discuss barriers to discharge. At admission, patient required close supervision to min assist for basic self care tasks and min assist for mobility.  He has had improvement in activity tolerance, balance, postural control, as well as ability to compensate for deficits.  He is able to complete ADL tasks independently. He is able to perform  transfers at modified independent level and does not require SB for car transfers. He was educated on continuing HEP after discharge.     Disposition: 01-Home or Self Care  Diet: Regular.   Special Instructions: 1. NO alcohol. NO smoking.  2. Wean Oxycodone to every 6 hours as needed for severe pain. REFILLS ON PAIN MEDICATIONS BY DR. DUDA  OR YOUR PRIMARY CARE PHYSICIAN.  3. Contact MD if you develop any problems with your incision/wound--redness, swelling, increase in pain, drainage or if you develop fever or chills.    Discharge Instructions    Ambulatory referral to Physical Medicine Rehab    Complete by:  As directed   1-2 week follow up after B-AKA/moderate complexity            Medication List    STOP taking these medications        amLODipine 10 MG tablet  Commonly known as:  NORVASC     cyclobenzaprine 10 MG tablet  Commonly known as:  FLEXERIL      TAKE these medications        ALPRAZolam 0.5 MG tablet--Rx # 5 pills  Commonly known as:  XANAX  Take 1 tablet (0.5 mg total) by mouth at bedtime as needed for sleep.     amitriptyline 50 MG tablet  Commonly known as:  ELAVIL  Take 50 mg by mouth at bedtime.     cetirizine 10 MG tablet  Commonly known as:  ZYRTEC  Take 10 mg by mouth daily.     citalopram 40 MG tablet  Commonly known as:  CELEXA  Take 40 mg by mouth daily.     methocarbamol 500 MG tablet  Commonly known as:  ROBAXIN  Take 1 tablet (500 mg total) by mouth every 6 (six) hours as needed for muscle spasms.     montelukast 10 MG tablet  Commonly known as:  SINGULAIR  Take 10 mg by mouth daily as needed. allergies     oxyCODONE 5 MG immediate release tablet--Rx # 35 pills  Commonly known as:  Oxy IR/ROXICODONE  Take 1 tablet (5 mg total) by mouth every 4 (four) hours as needed for severe pain. Wean to 4-6 hours as needed     pantoprazole 40 MG tablet  Commonly known as:  PROTONIX  Take 40 mg by mouth daily.     pregabalin 50 MG capsule  Rx # 60 pills   Commonly known as:  LYRICA  Take 1 capsule (50 mg total) by mouth daily.     risperiDONE 1 MG tablet  Commonly known as:  RISPERDAL  Take 1 tablet (1 mg total) by mouth at bedtime.     senna-docusate 8.6-50 MG tablet  Commonly known as:  Senokot-S  Take 2 tablets by mouth at bedtime.     traMADol 50 MG tablet--Rx # 75 pills  Commonly known as:  ULTRAM  Take 1-2 tablets (50-100 mg total) by  mouth every 6 (six) hours as needed for moderate pain.       Follow-up Information    Follow up with Erick Colace, MD.   Specialty:  Physical Medicine and Rehabilitation   Why:  office will call you with follow up appointment   Contact information:   [address] [place] [address] [phone]       Follow up with Cecille Aver, MD. Call on 03/05/2016.   Specialty:  Nephrology   Why:  BE THERE AT 9:45 AM FOR FOLLOW UP APPOINTMENT/RECHECK ON KIDNEYS.    Contact information:   [address] [place] [address] [phone]       Follow up with DUDA,MARCUS V, MD. Call today.   Specialty:  Orthopedic Surgery   Why:  for post op check in 1-2 weeks/Dr. Lajoyce Corners or primary care to refill pain medications.    Contact information:   [address] [place] [address] [phone]       Follow up with Howard Pouch, MD On 02/08/2016.   Why:  BE THERE AT 11 AM   Contact information:   [address] [place] [address] [phone]       Signed: Jacquelynn Cree 01/30/2016, 5:56 PM

## 2016-01-30 NOTE — Progress Notes (Signed)
[facility]     PROGRESS NOTE    Subjective/Complaints: Learned how to do ACE wraps  ROS: no stump pain currently. Pt denies CP, SOB, N/V/D.  Objective: Vital Signs: Blood pressure 114/67, pulse 81, temperature 98.1 F (36.7 C), temperature source Oral, resp. rate 18, height 6\' 1"  (1.854 m), weight 70.171 kg (154 lb 11.2 oz), SpO2 98 %. No results found. No results for input(s): WBC, HGB, HCT, PLT in the last 72 hours.  Recent Labs  01/29/16 0425 01/30/16 0516  NA 139 140  K 4.4 4.2  CL 101 103  GLUCOSE 100* 95  BUN 45* 42*  CREATININE 2.24* 2.19*  CALCIUM 9.7 9.6   CBG (last 3)  No results for input(s): GLUCAP in the last 72 hours.  Wt Readings from Last 3 Encounters:  01/29/16 70.171 kg (154 lb 11.2 oz)  01/23/16 74.5 kg (164 lb 3.9 oz)  11/16/12 103.42 kg (228 lb)    Physical Exam:  Constitutional:  He appears well-developed and well-nourished. No distress.  HENT: Normocephalic and atraumatic.  Cardiovascular: Normal rate and regular rhythm.No murmur heard. Respiratory: Effort normal and breath sounds normal. No stridor. No respiratory distress. He has no wheezes.  GI: Soft. Bowel sounds are normal. He exhibits no distension. There is no tenderness.  Musculoskeletal: He exhibits edema and tenderness.  Bilateral AKA sensitive to touch R >L (improving).  Neurological: He is alert and oriented.  Motor: B/l UE 5/5 proximal to distal B/l LE: hip flexion 4+/5 proximal (pain inhibition) Sensation intact to light touch  Skin: Skin is warm and dry. He is not diaphoretic.  B/l AKA with sutures in place-clean,dry and intact.  Small amt serous drainage on 4x4 Psychiatric: Very positive. He has a normal mood and affect. His behavior is normal. Judgment and thought content normal.   Assessment/Plan: 1. Mobility and functional deficits secondary to bilateral AKA's/rhabdomyolysis  Stable for D/C today F/u PCP in 3-4 weeks F/u PM&R  2 weeks See D/C summary See D/C instructions Function:  Bathing Bathing position   Position: Wheelchair/chair at sink  Bathing parts Body parts bathed by patient: Right arm, Left arm, Chest, Abdomen, Front perineal area, Buttocks, Right upper leg, Left upper leg, Right lower leg, Left lower leg Body parts bathed by helper: Back  Bathing assist Assist Level: More than reasonable time      Upper Body Dressing/Undressing Upper body dressing   What is the patient wearing?: Pull over shirt/dress     Pull over shirt/dress - Perfomed by patient: Thread/unthread right sleeve, Thread/unthread left sleeve, Put head through opening, Pull shirt over trunk          Upper body assist Assist Level: More than reasonable time      Lower Body Dressing/Undressing Lower body dressing   What is the patient wearing?: Pants, Underwear Underwear - Performed by patient: Thread/unthread right underwear leg, Thread/unthread left underwear leg, Pull underwear up/down   Pants- Performed by patient: Thread/unthread right pants leg, Thread/unthread left pants leg, Pull pants up/down                        Lower body assist Assist for lower body dressing: More than reasonable time      Toileting Toileting Toileting activity did not occur: Safety/medical concerns Toileting steps completed by patient: Adjust clothing prior to toileting, Adjust clothing after toileting, Performs perineal hygiene      Toileting assist Assist level: More than reasonable time  Transfers Chair/bed transfer   Chair/bed transfer method: Lateral scoot Chair/bed transfer assist level: No help, no cues Chair/bed transfer assistive device: Armrests     Locomotion Ambulation Ambulation activity did not occur: N/A         Wheelchair   Type: Manual Max wheelchair distance: 300 Assist Level: No help, No cues, assistive device, takes more than reasonable amount of time  Cognition Comprehension Comprehension  assist level: Follows complex conversation/direction with extra time/assistive device  Expression Expression assist level: Expresses complex ideas: With extra time/assistive device  Social Interaction Social Interaction assist level: Interacts appropriately with others - No medications needed.  Problem Solving Problem solving assist level: Solves complex problems: Recognizes & self-corrects  Memory Memory assist level: Complete Independence: No helper   Medical Problem List and Plan: 1. Gait abnormality secondary to B/l AKA  Cont CIR, PT, OT, plan D/C today 2. DVT Prophylaxis/Anticoagulation: Pharmaceutical: Lovenox 3. Pain Management: On lyrica and elavil for management of neuropathy.  Continue oxycodone prn, tramadol prn, will wean as tolerated, pt still using on regular basis  -increased lyrica to  BID for phantom limb pain. Limited in titration due to renal status at present  -encouraged patient to use massage and visual feedback 4. Mood: LCSW to follow for evaluation and support.  5. Neuropsych: This patient is capable of making decisions on his own behalf. 6. Skin/Wound Care: Monitor incisions daily for healing. Educate on pressure relief measures.  7. Fluids/Electrolytes/Nutrition: Monitor I/O. Protein supplement to help supplement low stores and to promote healing.  8. Acute renal failure: HD on hold.   BUN/Cr improving.   Nephrology following. Appreciate recs. 9. Anemia:  hgb stable at 10.2 on 7/6. No signs of blood loss 10. Depression with anxiety: Mood appears to be stable. Continue Risperdal and celexa.  11. Dysuria: Improving. UA negative, cx neg 12. Constipation: augmented bowel program.  13. Tobacco abuse: Counsel 14. Transaminitis: resolved  LOS (Days) 7 A FACE TO FACE EVALUATION WAS PERFORMED  KIRSTEINS,ANDREW E 01/30/2016 8:04 AM

## 2016-01-31 ENCOUNTER — Inpatient Hospital Stay (HOSPITAL_COMMUNITY): Payer: BLUE CROSS/BLUE SHIELD

## 2016-01-31 ENCOUNTER — Inpatient Hospital Stay (HOSPITAL_COMMUNITY): Payer: BLUE CROSS/BLUE SHIELD | Admitting: Occupational Therapy

## 2016-01-31 ENCOUNTER — Telehealth: Payer: Self-pay | Admitting: *Deleted

## 2016-01-31 NOTE — Telephone Encounter (Signed)
  Per Pam 01/30/16:     I did give patient one week supply on Xanax, ultram and oxycodone. He has been set up and told to follow up with Lajoyce Cornersuda or MD--he went home with his ex wife Myriam JacobsonHelen

## 2016-01-31 NOTE — Telephone Encounter (Signed)
Initial contact made with mother @ patients listed phone number. (she has seen him today) though he is staying with ex-wife)  1. Are you/is patient experiencing any problems since coming home? Are there any questions regarding any aspect of care? No problems 2. Are there any questions regarding medications administration/dosing? Are meds being taken as prescribed? Patient should review meds with caller to confirm Has his medications and is taking them. 3. Have there been any falls? He did have a fall this am.  EMS was called and they checked him out but no further treatment needed. Only injury was an abrasion on his back 4. Has Home Health been to the house and/or have they contacted you? If not, have you tried to contact them? Can we help you contact them? PT has been out to see but will require no further visits "since he is doing so well" 5. Are bowels and bladder emptying properly? Are there any unexpected incontinence issues? If applicable, is patient following bowel/bladder programs? No problems 6. Any fevers, problems with breathing, unexpected pain? No problems 7. Are there any skin problems or new areas of breakdown? New abrasion on his back from fall--minor 8. Has the patient/family member arranged specialty MD follow up (ie cardiology/neurology/renal/surgical/etc)?  Can we help arrange? Appointment made with Dr Wynn BankerKirsteins 9. Does the patient need any other services or support that we can help arrange? No 10. Are caregivers following through as expected in assisting the patient? Yes        11. Has the patient quit smoking, drinking alcohol, or using drugs as recommended? Yes  Appointment given for 02/11/16 with Dr Wynn BankerKirsteins @ 2:15, please arrive at 1:45 Reviewed office address

## 2016-01-31 NOTE — Telephone Encounter (Signed)
-----   Message from Jacquelynn CreePamela S Love, PA-C sent at 01/30/2016  8:35 AM EDT ----- Had B-BKA 3 days on floor/ichemia--[place] and was taking a lot of meds at home--question accuracy?  I wrote for ultram which is new others to come from his MD.

## 2016-02-08 ENCOUNTER — Encounter: Payer: Self-pay | Admitting: Student in an Organized Health Care Education/Training Program

## 2016-02-08 ENCOUNTER — Ambulatory Visit (INDEPENDENT_AMBULATORY_CARE_PROVIDER_SITE_OTHER): Payer: BLUE CROSS/BLUE SHIELD | Admitting: Student in an Organized Health Care Education/Training Program

## 2016-02-08 VITALS — BP 100/68 | HR 83 | Temp 98.3°F

## 2016-02-08 DIAGNOSIS — D6489 Other specified anemias: Secondary | ICD-10-CM

## 2016-02-08 DIAGNOSIS — J302 Other seasonal allergic rhinitis: Secondary | ICD-10-CM

## 2016-02-08 DIAGNOSIS — F32A Depression, unspecified: Secondary | ICD-10-CM | POA: Insufficient documentation

## 2016-02-08 DIAGNOSIS — F329 Major depressive disorder, single episode, unspecified: Secondary | ICD-10-CM | POA: Diagnosis not present

## 2016-02-08 DIAGNOSIS — G894 Chronic pain syndrome: Secondary | ICD-10-CM | POA: Diagnosis not present

## 2016-02-08 DIAGNOSIS — I10 Essential (primary) hypertension: Secondary | ICD-10-CM

## 2016-02-08 LAB — CBC
HCT: 33.9 % — ABNORMAL LOW (ref 38.5–50.0)
Hemoglobin: 11.4 g/dL — ABNORMAL LOW (ref 13.2–17.1)
MCH: 28 pg (ref 27.0–33.0)
MCHC: 33.6 g/dL (ref 32.0–36.0)
MCV: 83.3 fL (ref 80.0–100.0)
MPV: 9.6 fL (ref 7.5–12.5)
PLATELETS: 228 10*3/uL (ref 140–400)
RBC: 4.07 MIL/uL — ABNORMAL LOW (ref 4.20–5.80)
RDW: 14 % (ref 11.0–15.0)
WBC: 8.2 10*3/uL (ref 3.8–10.8)

## 2016-02-08 MED ORDER — FLUTICASONE PROPIONATE 50 MCG/ACT NA SUSP: 2.0000 | Freq: Every day | NASAL | Status: DC

## 2016-02-08 NOTE — Assessment & Plan Note (Addendum)
Given history of back pain, s/p BKA and now in wheelchair, some level of ongoing pain is expected.  Patient's pain medications currently managed by pt's orthopaedic surgeon Dr. Lajoyce Cornersuda, patient asks to switch management over to our clinic, however with his numerous medications reasonable cause for pain, it is in patient's best interest to be followed by pain management to set up a regimen for long-term control. - Referal to pain management ordered today
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
## 2016-02-08 NOTE — Patient Instructions (Addendum)
It was a pleasure seeing you today in our clinic. Today we discussed your medications and allergies. Here is the treatment plan we have discussed and agreed upon together:  - Stop amlodipine. Check BP at home, goal <140/80.   - Schedule a BP check next week. - Continue physical therapy at home.  - Pain management referral  - Please have previous PCP send all of your records.  Allergies: - stop your previous nasal spray and start Flonase - Continue Singulair  Our clinic's number is [phone]. Please call with any questions or concerns about what we discussed today. - Dr. Mosetta PuttFeng
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
## 2016-02-08 NOTE — Assessment & Plan Note (Addendum)
Patient taking multiple psych medications including risperidone, citalopram, clonazepam, regimen set up at rehab.   Previously using xanax, however only has one pill left.  - Depression well-controlled at this time - Patient asked to have health records faxed from previous PCP to establish psychiatric history - May require psych referral for continued management

## 2016-02-08 NOTE — Assessment & Plan Note (Addendum)
Amlodipine stopped in the hospital and pressures remained controlled. - BP 111/68 today, pt did take amlodpine today - will stop amlodipine, start recording home BP - F/U for blood pressure check next week, assess if amlodipine is still needed based on home readings/ follow up reading

## 2016-02-08 NOTE — Assessment & Plan Note (Signed)
-   Patient noted to be anemic with Hgb/Hct 10.2/32 on 01/24/2016.  Will follow up with CBC at this visit.

## 2016-02-11 ENCOUNTER — Encounter: Payer: Self-pay | Admitting: Family Medicine

## 2016-02-11 ENCOUNTER — Encounter: Payer: BLUE CROSS/BLUE SHIELD | Admitting: Physical Medicine & Rehabilitation

## 2016-02-15 ENCOUNTER — Encounter: Payer: BLUE CROSS/BLUE SHIELD | Attending: Physical Medicine & Rehabilitation

## 2016-02-15 ENCOUNTER — Ambulatory Visit (INDEPENDENT_AMBULATORY_CARE_PROVIDER_SITE_OTHER): Payer: BLUE CROSS/BLUE SHIELD | Admitting: *Deleted

## 2016-02-15 ENCOUNTER — Encounter: Payer: Self-pay | Admitting: Physical Medicine & Rehabilitation

## 2016-02-15 ENCOUNTER — Ambulatory Visit (HOSPITAL_BASED_OUTPATIENT_CLINIC_OR_DEPARTMENT_OTHER): Payer: BLUE CROSS/BLUE SHIELD | Admitting: Physical Medicine & Rehabilitation

## 2016-02-15 VITALS — BP 122/72 | HR 87

## 2016-02-15 VITALS — BP 124/82 | HR 86 | Resp 17

## 2016-02-15 DIAGNOSIS — F17290 Nicotine dependence, other tobacco product, uncomplicated: Secondary | ICD-10-CM | POA: Insufficient documentation

## 2016-02-15 DIAGNOSIS — G546 Phantom limb syndrome with pain: Secondary | ICD-10-CM

## 2016-02-15 DIAGNOSIS — Z89612 Acquired absence of left leg above knee: Secondary | ICD-10-CM

## 2016-02-15 DIAGNOSIS — Z0130 Encounter for examination of blood pressure without abnormal findings: Secondary | ICD-10-CM

## 2016-02-15 DIAGNOSIS — I10 Essential (primary) hypertension: Secondary | ICD-10-CM | POA: Insufficient documentation

## 2016-02-15 DIAGNOSIS — Z89611 Acquired absence of right leg above knee: Secondary | ICD-10-CM | POA: Diagnosis not present

## 2016-02-15 DIAGNOSIS — M545 Low back pain: Secondary | ICD-10-CM | POA: Insufficient documentation

## 2016-02-15 DIAGNOSIS — Z136 Encounter for screening for cardiovascular disorders: Secondary | ICD-10-CM

## 2016-02-15 DIAGNOSIS — G8929 Other chronic pain: Secondary | ICD-10-CM | POA: Insufficient documentation

## 2016-02-15 MED ORDER — PREGABALIN 75 MG PO CAPS: 75.0000 mg | ORAL_CAPSULE | Freq: Two times a day (BID) | ORAL | 1 refills | Status: DC

## 2016-02-15 NOTE — Progress Notes (Signed)
Subjective:     Patient ID: Jeffery Gross, male   DOB: [birth year], 55 y.o.   MRN: [id number] 54-y.o. male with PMH of DDD, chronic back pain, anxiety with depression, HTN who was admitted on 01/03/16 after fall due to presumed syncope and was on the floor for 2-3 days with inability to walk. He was found to have rhabdomyolysis with acute renal failure, BLE DVT, and muscle necrosis of RLE with bilateral compartment syndrome. He was started on dialysis for nonoliguric AKI due to ATN and nephrology has been following for input. He has required fasciotomy with multiple I & D procedures due to attempts at limb salvage. He underewent B-AKA on 01/09/16 by Dr. Lajoyce Corners. ABLA managed with transfusion and IV iron to supllement low iron stores. Right IJ Tunneled HD cath placed on 06/30 by IVR/Dr. Deanne Coffer. He is showing some signs of renal recovery with improvement in UOP and HD was discontinued on 07/01 and nephrology signed off with recommendations to follow up on outpatient basis. His pain control had improved with good participation in therapy HPI  Patient has returned home. He is getting home health PT and [facility] health RN, [facility]. He is getting follow-up visits [facility]. Has been getting his chronic opioids from his prior primary care physician. Received oxycodone 5 mg every 4-6 hours 35 tablets prescribed at the hospital. Upon discharge from rehabilitation. Still has about 6 tablets left. He takes 2-3 tablets per day on average. This is actually less than what he had been taking for his chronic back pain. He states he was taking 10-15 mg tablets of oxycodone prior to his surgery. Still has some phantom limb pain. This is his primary complaint rather than stump pain. He was taking Lyrica 50 g twice a day. We discussed his renal insufficiency and need to be careful about Lyrica dosing. GFR is around 30-35  Pain Inventory Average Pain 7 Pain  Right Now 7 My pain is sharp and stabbing  In the last 24 hours, has pain interfered with the following? General activity NA Relation with others NA Enjoyment of life NA What TIME of day is your pain at its worst? NA Sleep (in general) Fair  Pain is worse with: sitting Pain improves with: medication Relief from Meds: NA  Mobility use a wheelchair  Function disabled: date disabled NA I need assistance with the following:  shopping  Neuro/Psych No problems in this area  Prior Studies Any changes since last visit?  no  Physicians involved in your care NA   Family History  Problem Relation Age of Onset  . Hypertension Father    Social History   Social History  . Marital status: Divorced    Spouse name: N/A  . Number of children: N/A  . Years of education: N/A   Social History Main Topics  . Smoking status: Current Some Day Smoker    Types: Cigars  . Smokeless tobacco: Current User  . Alcohol use 0.0 oz/week     Comment: 12 pack a month  . Drug use: Unknown  . Sexual activity: Not Asked   Other Topics Concern  . None   Social History Narrative  . None   Past Surgical History:  Procedure Laterality Date  . AMPUTATION Bilateral 01/09/2016   Procedure: Bilateral Above Knee Amputation, Apply Wound VAC;  Surgeon: Nadara Mustard, MD;  Location: [facility] OR;  Service: Orthopedics;  Laterality: Bilateral;  . FASCIOTOMY Bilateral 01/03/2016  Procedure: FASCIOTOMY;  Surgeon: Tarry Kos, MD;  Location: [facility] OR;  Service: Orthopedics;  Laterality: Bilateral;  . I&D EXTREMITY Bilateral 01/05/2016   Procedure: IRRIGATION AND DEBRIDEMENT BILATERAL LOWER EXTREMITIES; WOUND VAC CHANGE;  Surgeon: Tarry Kos, MD;  Location: [facility] OR;  Service: Orthopedics;  Laterality: Bilateral;  . I&D EXTREMITY Left 01/07/2016   Procedure: Irrigation and Debridement of Left Lower Extremity with Application of Wound Vac;  Surgeon: Tarry Kos, MD;  Location: [facility] OR;  Service: Orthopedics;   Laterality: Left;  Marland Kitchen VASECTOMY     Past Medical History:  Diagnosis Date  . Back pain   . Chest pain   . DDD (degenerative disc disease), cervical   . Depression   . HTN (hypertension)    BP 124/82   Pulse 86   Resp 17   SpO2 97%   Opioid Risk Score:   Fall Risk Score:  `1  Depression screen PHQ 2/9  Depression screen PHQ 2/9 02/08/2016  Decreased Interest 0  Down, Depressed, Hopeless 0  PHQ - 2 Score 0    Review of Systems  All other systems reviewed and are negative.      Objective:   Physical Exam  Constitutional: He is oriented to person, place, and time. He appears well-developed and well-nourished.  HENT:  Head: Normocephalic and atraumatic.  Eyes: Conjunctivae and EOM are normal. Pupils are equal, round, and reactive to light.  Neck: Normal range of motion.  Cardiovascular: Normal rate, regular rhythm and normal heart sounds.   Pulmonary/Chest: Effort normal and breath sounds normal. No respiratory distress.  Abdominal: Soft. Bowel sounds are normal.  Musculoskeletal:  Bilateral lateral knee.". Stumps without drainage, edema is improving, still has sutures.  Neurological: He is alert and oriented to person, place, and time.  Psychiatric: He has a normal mood and affect.  Nursing note and vitals reviewed.      Assessment:     Status post bilateral above-knee amputation. Overall, doing very well modified independent level, will not need any further physical therapy until after she receives prosthetics. He is not currently getting hemodialysis but needs to follow-up with nephrology, appointment is scheduled.  In terms of his narcotic analgesics plans to first follow-up with orthopedics.  In terms of his phantom limb pain. Recommend increase Lyrica to 75 mg twice a day  [facility]  Physical medicine and rehabilitation follow-up on an as-needed basis    Plan:     See above

## 2016-02-15 NOTE — Progress Notes (Signed)
   Patient in nurse clinic for blood pressure check.  Patient is not taking any blood pressure medication per doctor's orders.  Patient stated provider is trying to take him off blood pressure medication.  Patient denies any chest pain, SOB, dizziness or headache.  Will forward to PCP.  Clovis Pu, RN  Today's Vitals   02/15/16 1533  BP: 122/72  Pulse: 87  SpO2: 97%  PainSc: 0-No pain

## 2016-02-15 NOTE — Patient Instructions (Signed)
Phantom Limb Pain Phantom limb pain occurs in an arm or leg following an amputation. It is pain in an extremity that no longer exists. This pain varies with different patients. Different activities may cause the pain. Some people with an amputated limb experience the opposite of phantom pain, which is phantom pleasure.  The trouble may start in a part of the brain known as the sensory cortex. The sensory cortex is the portion of your brain that processes sensations from the rest of your body. It is hypothesized that when a body part is lost, the corresponding part of the brain is not able to handle the loss and rewires its circuitry to make up for the signals it no longer receives from the missing extremity. The exact mechanism of how phantom limb pain occurs is not known. The severity of pain seems to be correlated with personal problems such as stress and attitude. It also seems to correlate with the amount of pain a person had before the operation. CAUSES   Damaged nerve endings.  Scar tissue at the amputation site. TREATMENT  Phantom limb pain can be severe and debilitating. Most cases of phantom limb pain only last briefly. There are a number of different therapies and medications that may give relief. Keep working with your health care provider until relief is obtained. Some treatments that may be helpful include:  Hypnosis and mental imagery. Their techniques can give patients the needed impetus to recognize their ability to regain control.  Biofeedback.  Relaxation techniques. They are related to hypnosis techniques and use the mind and body to control pain.  Acupuncture.  Massage.  Exercise.  Antidepressant medicine.  Anticonvulsant medicine.  Narcotics or pain medicines. SEEK MEDICAL CARE IF: Pain is not relieved or increases.   This information is not intended to replace advice given to you by your health care provider. Make sure you discuss any questions you have with  your health care provider.   Document Released: 09/27/2002 Document Revised: 03/09/2013 Document Reviewed: 12/07/2012 Elsevier Interactive Patient Education 2016 [employer]  

## 2016-03-10 ENCOUNTER — Other Ambulatory Visit: Payer: Self-pay | Admitting: Student in an Organized Health Care Education/Training Program

## 2016-03-10 DIAGNOSIS — J302 Other seasonal allergic rhinitis: Secondary | ICD-10-CM

## 2016-03-10 NOTE — Telephone Encounter (Signed)
Needs refill on risperidone, citalopram, montelukast, flonase, claritan, roxidocodone.  [facility] [place]

## 2016-03-20 MED ORDER — CETIRIZINE HCL 10 MG PO TABS: 10.0000 mg | ORAL_TABLET | Freq: Every day | ORAL | 3 refills | Status: DC

## 2016-03-20 MED ORDER — FLUTICASONE PROPIONATE 50 MCG/ACT NA SUSP: 2.0000 | Freq: Every day | NASAL | 6 refills | Status: DC

## 2016-05-29 ENCOUNTER — Encounter: Payer: Self-pay | Admitting: Physical Therapy

## 2016-05-29 ENCOUNTER — Ambulatory Visit: Payer: BLUE CROSS/BLUE SHIELD | Attending: Orthopedic Surgery | Admitting: Physical Therapy

## 2016-05-29 DIAGNOSIS — R293 Abnormal posture: Secondary | ICD-10-CM | POA: Diagnosis present

## 2016-05-29 DIAGNOSIS — Z9181 History of falling: Secondary | ICD-10-CM | POA: Diagnosis present

## 2016-05-29 DIAGNOSIS — M25651 Stiffness of right hip, not elsewhere classified: Secondary | ICD-10-CM | POA: Diagnosis present

## 2016-05-29 DIAGNOSIS — M545 Low back pain: Secondary | ICD-10-CM | POA: Diagnosis present

## 2016-05-29 DIAGNOSIS — R2681 Unsteadiness on feet: Secondary | ICD-10-CM | POA: Diagnosis present

## 2016-05-29 DIAGNOSIS — G8929 Other chronic pain: Secondary | ICD-10-CM | POA: Diagnosis present

## 2016-05-29 DIAGNOSIS — R2689 Other abnormalities of gait and mobility: Secondary | ICD-10-CM

## 2016-05-29 DIAGNOSIS — M25652 Stiffness of left hip, not elsewhere classified: Secondary | ICD-10-CM | POA: Diagnosis present

## 2016-05-29 DIAGNOSIS — M6281 Muscle weakness (generalized): Secondary | ICD-10-CM | POA: Diagnosis present

## 2016-05-29 NOTE — Patient Instructions (Signed)
[facility] [address] [place], [place] Phone: [phone] / [phone]

## 2016-05-30 NOTE — Therapy (Signed)
[facility] [address] [place], [place], [address] Phone: [phone]   Fax:  [phone]  Physical Therapy Evaluation  Patient Details  Name: Jeffery Gross MRN: [id number] Date of Birth: 1960-12-05 Referring Provider: Aldean Baker, MD  Encounter Date: 05/29/2016      PT End of Session - 05/30/16 1911    Visit Number 1   Number of Visits 25   Date for PT Re-Evaluation 08/22/16   Authorization Type [employer]   PT Start Time 1529   PT Stop Time 1618   PT Time Calculation (min) 49 min   Equipment Utilized During Treatment Gait belt   Activity Tolerance Patient limited by pain   Behavior During Therapy [facility] for tasks assessed/performed      Past Medical History:  Diagnosis Date  . Back pain   . Chest pain   . DDD (degenerative disc disease), cervical   . Depression   . HTN (hypertension)     Past Surgical History:  Procedure Laterality Date  . AMPUTATION Bilateral 01/09/2016   Procedure: Bilateral Above Knee Amputation, Apply Wound VAC;  Surgeon: Nadara Mustard, MD;  Location: [facility] OR;  Service: Orthopedics;  Laterality: Bilateral;  . FASCIOTOMY Bilateral 01/03/2016   Procedure: FASCIOTOMY;  Surgeon: Tarry Kos, MD;  Location: [facility] OR;  Service: Orthopedics;  Laterality: Bilateral;  . I&D EXTREMITY Bilateral 01/05/2016   Procedure: IRRIGATION AND DEBRIDEMENT BILATERAL LOWER EXTREMITIES; WOUND VAC CHANGE;  Surgeon: Tarry Kos, MD;  Location: [facility] OR;  Service: Orthopedics;  Laterality: Bilateral;  . I&D EXTREMITY Left 01/07/2016   Procedure: Irrigation and Debridement of Left Lower Extremity with Application of Wound Vac;  Surgeon: Tarry Kos, MD;  Location: [facility] OR;  Service: Orthopedics;  Laterality: Left;  Marland Kitchen VASECTOMY      There were no vitals filed for this visit.       Subjective Assessment - 05/29/16 1534    Subjective This 55yo collapsed on 12/30/2015 while mowing the lawn. He went to the hospital 2 days later  and was hospitalized and diagonised with Rhabdomylolysis.  Complications required Bilateral Transfemoral Amputations on 01/09/2016. He recieved his first set of prostheses which are foreshortened prostheses (Stubbies: sockets with feet & no knee units) on 05/15/2016. He is dependent in use & care. He presents for PT evaluation.    Patient is accompained by: --  caregiver   Pertinent History DDD, chronic low back pain, HTN, 2 back surgeries   Limitations Lifting;Standing;Walking   Patient Stated Goals to use prostheses to get around in home & community. Go fishing   Currently in Pain? Yes   Pain Score 4   In last week, worst 8/10, best 2/10   Pain Location Back   Pain Orientation Lower   Pain Descriptors / Indicators Burning   Pain Type Chronic pain   Pain Radiating Towards both legs R>L thigh level   Pain Onset More than a month ago   Pain Frequency Constant   Aggravating Factors  moving wrong or staying one position too long   Pain Relieving Factors medications.   Effect of Pain on Daily Activities Limits standing   Multiple Pain Sites No            [facility] PT Assessment - 05/29/16 1530      Assessment   Medical Diagnosis Bilateral Transfemoral Amputations   Referring Provider Aldean Baker, MD   Onset Date/Surgical Date 05/15/16  prostheses delivery   Hand Dominance Right  Precautions   Precautions Fall     Balance Screen   Has the patient fallen in the past 6 months Yes   How many times? 3  leaning too far,    Has the patient had a decrease in activity level because of a fear of falling?  No   Is the patient reluctant to leave their home because of a fear of falling?  No     Home Environment   Living Environment Private residence   Living Arrangements Spouse/significant other   Type of Home House   Home Access Ramped entrance  ramp over 4 steps, front entrance has 12 steps   Home Layout One level   Home Equipment Walker - standard;Tub bench;Grab bars -  tub/shower;Wheelchair - manual     Prior Function   Level of Independence Independent;Independent with gait  limited by back pain,    Leisure fishing, yard work, tactor driving for tasks,      Observation/Other Assessments   Focus on Therapeutic Outcomes (FOTO)  44.88 Functional Status   Activities of Balance Confidence Scale (ABC Scale)  11.9%   Fear Avoidance Belief Questionnaire (FABQ)  19     Posture/Postural Control   Posture/Postural Control Postural limitations   Postural Limitations Rounded Shoulders;Forward head;Increased lumbar lordosis;Flexed trunk     ROM / Strength   AROM / PROM / Strength AROM;Strength     AROM   Overall AROM  Deficits   AROM Assessment Site Hip   Right/Left Hip Right;Left   Right Hip Extension -18  Thomas   Left Hip Extension -21  Thomas position     Strength   Overall Strength Deficits   Overall Strength Comments BUEs grossly 5/5   Strength Assessment Site Hip   Right/Left Hip Right;Left   Right Hip Flexion 4/5   Right Hip Extension 3+/5   Right Hip ABduction 4-/5   Left Hip Flexion 4/5   Left Hip Extension 3+/5   Left Hip ABduction 4-/5     Transfers   Transfers Sit to Stand;Stand to Sit;Lateral/Scoot Transfers   Sit to Stand 5: Supervision;With upper extremity assist;With armrests;From chair/3-in-1  pushing / pulling on parallel bars   Sit to Stand Details (indicate cue type and reason) Unable to transition hands from w/c to bars without balance loss, so pushes on bars   Stand to Sit 5: Supervision;With upper extremity assist;To chair/3-in-1  uses hands on parallel bars to control descent   Stand to Sit Details unable to reach for w/c   Lateral/Scoot Transfers 6: Modified independent (Device/Increase time);With armrests removed  level surfaces in direct contact     Ambulation/Gait   Ambulation/Gait Yes   Ambulation/Gait Assistance 4: Min guard   Ambulation/Gait Assistance Details Excessive UE weight bearing required    Ambulation Distance (Feet) 15 Feet   Assistive device Prostheses;Parallel bars  Foreshortened Prostheses   Gait Pattern Step-through pattern;Decreased stride length;Right hip hike;Left hip hike;Right circumduction;Left circumduction;Trunk flexed   Ambulation Surface Indoor;Level     Balance   Balance Assessed Yes     Static Standing Balance   Static Standing - Balance Support Bilateral upper extremity supported;No upper extremity supported   Static Standing - Level of Assistance 5: Stand by assistance;4: Min assist  MinA without UE support & SBA with parallel bars   Static Standing - Comment/# of Minutes 2 min     Dynamic Standing Balance   Dynamic Standing - Balance Support Left upper extremity supported;No upper extremity supported  reaching with LUE  support & head mvmts No UE support   Dynamic Standing - Level of Assistance 3: Mod assist;4: Min assist  ModA no device head mvmt & MinA LUE support reach   Dynamic Standing - Balance Activities Eyes opn;Head nods;Head turns;Reaching for objects   Dynamic Standing - Comments reaches 2" anterior & to mid-thigh towards ground with LUE support on stable parallel bars. Standing without UE support turning head to side only & tilts head up/down with modA     Standardized Balance Assessment   Standardized Balance Assessment Berg Balance Test     Berg Balance Test   Sit to Stand Needs minimal aid to stand or to stabilize   Standing Unsupported Unable to stand 30 seconds unassisted   Sitting with Back Unsupported but Feet Supported on Floor or Stool Able to sit safely and securely 2 minutes   Stand to Sit Sits independently, has uncontrolled descent   Transfers Able to transfer safely, definite need of hands   Standing Unsupported with Eyes Closed Needs help to keep from falling   Standing Ubsupported with Feet Together Needs help to attain position and unable to hold for 15 seconds   From Standing, Reach Forward with Outstretched Arm Loses  balance while trying/requires external support   From Standing Position, Pick up Object from Floor Unable to try/needs assist to keep balance   From Standing Position, Turn to Look Behind Over each Shoulder Needs assist to keep from losing balance and falling   Turn 360 Degrees Needs assistance while turning   Standing Unsupported, Alternately Place Feet on Step/Stool Needs assistance to keep from falling or unable to try   Standing Unsupported, One Foot in Front Loses balance while stepping or standing   Standing on One Leg Unable to try or needs assist to prevent fall   Total Score 9         Prosthetics Assessment - 05/29/16 1530      Prosthetics   Prosthetic Care Dependent with Skin check;Residual limb care;Prosthetic cleaning;Ply sock cleaning;Correct ply sock adjustment;Proper wear schedule/adjustment;Proper weight-bearing schedule/adjustment   Donning prosthesis  Min assist   Doffing prosthesis  Supervision   Current prosthetic wear tolerance (days/week)  daily   Current prosthetic wear tolerance (#hours/day)  1-2 hrs 1-2 x/day   Current prosthetic weight-bearing tolerance (hours/day)  Pt tolerated standing in parallel bars with UE support for 3 minutes with back pain increasing from 4/10 to 6/10 and no limb pain or discomfort.    Edema non-pitting   Residual limb condition  No open areas, normal color, temperature & moisture, cylinderical shape                            PT Short Term Goals - 05/29/16 1923      PT SHORT TERM GOAL #1   Title Patient tolerates wear of prostheses >8hrs total /day without skin issues or limb pain. (Target Date: 06/27/2016)   Time 4   Period Weeks   Status New     PT SHORT TERM GOAL #2   Title Patient verbalizes proper cleaning & demo proper donning of prostheses.  (Target Date: 06/27/2016)   Time 4   Period Weeks   Status New     PT SHORT TERM GOAL #3   Title Patient stands with LUE support on walker or crutch 5" &  towards ground within 5" of floor with supervision.  (Target Date: 06/27/2016)   Time 4   Period  Weeks   Status New     PT SHORT TERM GOAL #4   Title Patient ambulates 3350' with RW or crutches & prostheses with minA.  (Target Date: 06/27/2016)   Time 4   Period Weeks   Status New     PT SHORT TERM GOAL #5   Title Patient reports back pain increases </= 3 increments on 0-10 scale with above activities.  (Target Date: 06/27/2016)   Time 4   Period Weeks   Status New           PT Long Term Goals - 05/29/16 1929      PT LONG TERM GOAL #1   Title Patient demonstrates & verbalizes proper prosthetic care to enable safe use of prostheses.  (Target Date: 08/22/2016)   Time 12   Period Weeks   Status New     PT LONG TERM GOAL #2   Title Patient tolerates wear of prostheses >80% of awake hours without skin issues or limb pain to enable function during his day. (Target Date: 08/22/2016)   Time 12   Period Weeks   Status New     PT LONG TERM GOAL #3   Title Patient has standing balance tolerating standing 2 minutes without UE support turning head to scan environment and with LUE support reaches 7" anteriorly & to floor modified independent. (Target Date: 08/22/2016)   Time 12   Period Weeks   Status New     PT LONG TERM GOAL #4   Title Patient ambulates 300' with prostheses & LRAD modified independent for community mobility. (Target Date: 08/22/2016)   Time 12   Period Weeks   Status New     PT LONG TERM GOAL #5   Title Patient negotiates ramps, curbs & stairs with LRAD & prostheses modified independent for community access. (Target Date: 08/22/2016)   Time 12   Period Weeks   Status New     Additional Long Term Goals   Additional Long Term Goals Yes     PT LONG TERM GOAL #6   Title Patient reports back pain increases </= 2 increments on 0-10 scale with above activities. (Target Date: 08/22/2016)   Time 12   Period Weeks   Status New               Plan - 05/29/16 1913     Clinical Impression Statement Patient was injured & developed Rhabdomyolosis requiring Bilateral Transfemoral Amputations 01/09/2016. He recieved Foreshortened (socket with feet no knees) prostheses on 05/15/2016 and is dependent in use & care limiting function. He has limited wear limiting potential to function during his awake hours. He has history of chronic low back pain which was aggrivated by 3 minutes of standing & gait activities. Patient's standing balance is impaired reaching 5" with LUE support on parallel bar with minimal assist and moderate assist to turn head to scan. Patient's condition is evolving and plan of care is moderate. Patient would benefit from skilled care to improve function & safety at community level with prostheses.    Rehab Potential Good   PT Frequency 2x / week   PT Duration 12 weeks   PT Treatment/Interventions ADLs/Self Care Home Management;Electrical Stimulation;Ultrasound;Moist Heat;DME Instruction;Gait training;Stair training;Functional mobility training;Therapeutic activities;Therapeutic exercise;Balance training;Neuromuscular re-education;Patient/family education;Prosthetic Training   PT Next Visit Plan Review prosthetic care, HEP at sink for midline & balance,    Consulted and Agree with Plan of Care Patient      Patient will benefit from skilled therapeutic  intervention in order to improve the following deficits and impairments:  Abnormal gait, Decreased activity tolerance, Decreased balance, Decreased range of motion, Decreased knowledge of use of DME, Decreased endurance, Decreased strength, Improper body mechanics, Postural dysfunction, Prosthetic Dependency, Pain  Visit Diagnosis: Other abnormalities of gait and mobility  Abnormal posture  Unsteadiness on feet  History of falling  Muscle weakness (generalized)  Stiffness of left hip, not elsewhere classified  Stiffness of right hip, not elsewhere classified  Chronic midline low back pain, with  sciatica presence unspecified     Problem List Patient Active Problem List   Diagnosis Date Noted  . Seasonal allergies 02/08/2016  . Anemia due to other cause 02/08/2016  . Chronic pain syndrome 02/08/2016  . Depression 02/08/2016  . Post-operative pain   . Ischemia of lower extremity 01/23/2016  . Hypoalbuminemia due to protein-calorie malnutrition (HCC)   . Dysuria   . Tobacco abuse   . Constipation due to pain medication   . Chronic low back pain   . Benign essential HTN   . Asthma   . Hyponatremia   . CKD (chronic kidney disease)   . Anemia of chronic disease   . Transaminitis   . Phantom limb syndrome with pain (HCC)   . Status post bilateral above knee amputation (HCC)   . Acute kidney injury (HCC)   . Syncope   . Pressure ulcer 01/12/2016  . Fall   . Encounter for central line placement   . Urinary retention   . AKI (acute kidney injury) (HCC)   . Traumatic rhabdomyolysis (HCC)   . Rhabdomyolysis 01/03/2016  . Nontraumatic compartment syndrome of leg 01/03/2016  . Chest pain 10/27/2012  . HTN (hypertension) 10/27/2012  . Back pain 10/27/2012    Vladimir Faster PT, DPT 05/30/2016, 7:38 PM  [facility] [address] [place], [place], [address] Phone: [phone]   Fax:  [phone]  Name: Jeffery Gross MRN: [id number] Date of Birth: 1961/01/21

## 2016-06-02 ENCOUNTER — Ambulatory Visit (INDEPENDENT_AMBULATORY_CARE_PROVIDER_SITE_OTHER): Payer: BLUE CROSS/BLUE SHIELD | Admitting: Orthopedic Surgery

## 2016-06-02 ENCOUNTER — Ambulatory Visit (INDEPENDENT_AMBULATORY_CARE_PROVIDER_SITE_OTHER): Payer: Self-pay | Admitting: Orthopedic Surgery

## 2016-06-02 ENCOUNTER — Encounter (INDEPENDENT_AMBULATORY_CARE_PROVIDER_SITE_OTHER): Payer: Self-pay | Admitting: Orthopedic Surgery

## 2016-06-02 DIAGNOSIS — G546 Phantom limb syndrome with pain: Secondary | ICD-10-CM

## 2016-06-02 DIAGNOSIS — G894 Chronic pain syndrome: Secondary | ICD-10-CM | POA: Diagnosis not present

## 2016-06-02 DIAGNOSIS — Z89612 Acquired absence of left leg above knee: Secondary | ICD-10-CM | POA: Diagnosis not present

## 2016-06-02 DIAGNOSIS — Z89611 Acquired absence of right leg above knee: Secondary | ICD-10-CM | POA: Diagnosis not present

## 2016-06-02 MED ORDER — DULOXETINE HCL 30 MG PO CPEP: 30.0000 mg | ORAL_CAPSULE | Freq: Every day | ORAL | 0 refills | Status: DC

## 2016-06-02 NOTE — Progress Notes (Signed)
Office Visit Note   Patient: Jeffery Gross           Date of Birth: 1961/04/09           MRN: [id number] Visit Date: 06/02/2016              Requested by: Howard PouchLauren Feng, MD [address] [place], [place] [id number] PCP: Howard PouchLauren Feng, MD   Assessment & Plan: Visit Diagnoses:  1. Phantom limb syndrome with pain (HCC)   2. Chronic pain syndrome   3. Status post bilateral above knee amputation (HCC)     Plan: Have called in a prescription for Cymbalta. He will stop the Lyrica. We'll follow up with us in 3 months in the office. Will call if he has any concerns in the meantime. Continue with gait training him with Robyn.  Follow-Up Instructions: No Follow-up on file.   Orders:  No orders of the defined types were placed in this encounter.  Meds ordered this encounter  Medications  . DULoxetine (CYMBALTA) 30 MG capsule    Sig: Take 1 capsule (30 mg total) by mouth daily. Qam    Dispense:  30 capsule    Refill:  0      Procedures: No procedures performed   Clinical Data: No additional findings.   Subjective: Chief Complaint  Patient presents with  . Right Leg - Follow-up    Above knee amputation bilateral 01/09/16 2 month follow up  . Left Leg - Follow-up    Above knee amputation bilateral 01/09/16 2 month follow up    Patient presents for two month follow up bilateral AKA's. He is having some phantom pains that he is taking lyrica for. He also has seen his neurosurgeon in the interim for his back. He has been having radicular pain into both legs, mainly the right. He takes oxycodone 15mg  for this. He is working currently with Zella BallRobin for physical therapy and has two visits scheduled this week. Wearing shrinkers bilaterally today.   States is only taking Lyrica 75 for nerve pain. Oxycodone and Flexeril. Minimal relief from the Lyrica. Has tried Gabapentin without relief. Is interested in other options. Would not like to increase dose of Lyrica. Feels the lyrica is causing hand  cramping.   Review of Systems  Constitutional: Negative for chills and fever.  Musculoskeletal: Positive for back pain.  All other systems reviewed and are negative.    Objective: Vital Signs: There were no vitals taken for this visit.  Physical Exam Physical Exam  Constitutional: He is oriented to person, place, and time. He appears well-developed and well-nourished.  Pulmonary/Chest: Effort normal.  Neurological: He is alert and oriented to person, place, and time.  Psychiatric: He has a normal mood and affect.  Nursing note reviewed.  Bilateral above the knee amputations well healed. Well consolidated. Wearing shinkers today.  Ortho Exam  Specialty Comments:  No specialty comments available.  Imaging: No results found.   PMFS History: Patient Active Problem List   Diagnosis Date Noted  . Seasonal allergies 02/08/2016  . Anemia due to other cause 02/08/2016  . Chronic pain syndrome 02/08/2016  . Depression 02/08/2016  . Post-operative pain   . Ischemia of lower extremity 01/23/2016  . Hypoalbuminemia due to protein-calorie malnutrition (HCC)   . Dysuria   . Tobacco abuse   . Constipation due to pain medication   . Chronic low back pain   . Benign essential HTN   . Asthma   . Hyponatremia   .  CKD (chronic kidney disease)   . Anemia of chronic disease   . Transaminitis   . Phantom limb syndrome with pain (HCC)   . Status post bilateral above knee amputation (HCC)   . Acute kidney injury (HCC)   . Syncope   . Pressure ulcer 01/12/2016  . Fall   . Encounter for central line placement   . Urinary retention   . AKI (acute kidney injury) (HCC)   . Traumatic rhabdomyolysis (HCC)   . Rhabdomyolysis 01/03/2016  . Nontraumatic compartment syndrome of leg 01/03/2016  . Chest pain 10/27/2012  . HTN (hypertension) 10/27/2012  . Back pain 10/27/2012   Past Medical History:  Diagnosis Date  . Back pain   . Chest pain   . DDD (degenerative disc disease),  cervical   . Depression   . HTN (hypertension)     Family History  Problem Relation Age of Onset  . Hypertension Father     Past Surgical History:  Procedure Laterality Date  . AMPUTATION Bilateral 01/09/2016   Procedure: Bilateral Above Knee Amputation, Apply Wound VAC;  Surgeon: Nadara MustardMarcus V Duda, MD;  Location: [facility] OR;  Service: Orthopedics;  Laterality: Bilateral;  . FASCIOTOMY Bilateral 01/03/2016   Procedure: FASCIOTOMY;  Surgeon: Tarry KosNaiping M Xu, MD;  Location: [facility] OR;  Service: Orthopedics;  Laterality: Bilateral;  . I&D EXTREMITY Bilateral 01/05/2016   Procedure: IRRIGATION AND DEBRIDEMENT BILATERAL LOWER EXTREMITIES; WOUND VAC CHANGE;  Surgeon: Tarry KosNaiping M Xu, MD;  Location: [facility] OR;  Service: Orthopedics;  Laterality: Bilateral;  . I&D EXTREMITY Left 01/07/2016   Procedure: Irrigation and Debridement of Left Lower Extremity with Application of Wound Vac;  Surgeon: Tarry KosNaiping M Xu, MD;  Location: [facility] OR;  Service: Orthopedics;  Laterality: Left;  Marland Kitchen. VASECTOMY     Social History   Occupational History  . Not on file.   Social History Main Topics  . Smoking status: Current Some Day Smoker    Types: Cigars  . Smokeless tobacco: Current User  . Alcohol use 0.0 oz/week     Comment: 12 pack a month  . Drug use: Unknown  . Sexual activity: Not on file

## 2016-06-04 ENCOUNTER — Encounter: Payer: Self-pay | Admitting: Physical Therapy

## 2016-06-04 ENCOUNTER — Ambulatory Visit: Payer: BLUE CROSS/BLUE SHIELD | Admitting: Physical Therapy

## 2016-06-04 DIAGNOSIS — R2681 Unsteadiness on feet: Secondary | ICD-10-CM

## 2016-06-04 DIAGNOSIS — R293 Abnormal posture: Secondary | ICD-10-CM

## 2016-06-04 DIAGNOSIS — Z9181 History of falling: Secondary | ICD-10-CM

## 2016-06-04 DIAGNOSIS — R2689 Other abnormalities of gait and mobility: Secondary | ICD-10-CM

## 2016-06-04 DIAGNOSIS — M6281 Muscle weakness (generalized): Secondary | ICD-10-CM

## 2016-06-04 NOTE — Patient Instructions (Signed)
Do each exercise 1-2  times per day Do each exercise 10 repetitions Hold each exercise for 5 seconds to feel your location  AT SINK FIND YOUR MIDLINE POSITION AND PLACE FEET EQUAL DISTANCE FROM THE MIDLINE.  USE TAPE ON FLOOR TO MARK THE MIDLINE POSITION. You also should try to feel with your limb pressure in socket.  You are trying to feel with limb what you used to feel with the bottom of your foot.  1. Side to Side Shift: Moving your hips only (not shoulders): move weight onto your left leg, HOLD/FEEL.  Move back to equal weight on each leg, HOLD/FEEL. Move weight onto your right leg, HOLD/FEEL. Move back to equal weight on each leg, HOLD/FEEL. Repeat. 2. Front to Back Shift: Moving your hips only (not shoulders): move your weight forward onto your toes, HOLD/FEEL. Move your weight back to equal Flat Foot on both legs, HOLD/FEEL. Move your weight back onto your heels, HOLD/FEEL. Move your weight back to equal on both legs, HOLD/FEEL. Repeat. 3. Moving Cones / Cups: With equal weight on each leg: Hold on with one hand the first time, then progress to no hand supports. Move cups from one side of sink to the other. Place cups ~2" out of your reach, progress to 10" beyond reach. 4. Overhead/Upward Reaching: alternated reaching up to top cabinets or ceiling if no cabinets present. Keep equal weight on each leg. Start with one hand support on counter while other hand reaches and progress to no hand support with reaching. 5.   Looking Over Shoulders: With equal weight on each leg: alternate turning to look          over your shoulders with one hand support on counter as needed. Shift weight to             side looking, pull hip then shoulder then head/eyes around to look behind you. Start       with one hand support & progress to no hand support. 

## 2016-06-05 ENCOUNTER — Ambulatory Visit: Payer: BLUE CROSS/BLUE SHIELD | Admitting: Physical Therapy

## 2016-06-05 NOTE — Therapy (Signed)
[facility] [address] [place], [place], [address] Phone: [phone]   Fax:  406[facility]-[phone]  Physical Therapy Treatment  Patient Details  Name: Jeffery HarbourScott D Clementson MRN: [id number] Date of Birth: [birth year] Referring Provider: Aldean BakerMarcus Duda, MD  Encounter Date: 06/04/2016      PT End of Session - 06/04/16 1417    Visit Number 2   Number of Visits 25   Date for PT Re-Evaluation 08/22/16   Authorization Type BCBS   PT Start Time 1402   PT Stop Time 1445   PT Time Calculation (min) 43 min   Equipment Utilized During Treatment Gait belt   Activity Tolerance Patient limited by pain   Behavior During Therapy [facility] for tasks assessed/performed      Past Medical History:  Diagnosis Date  . Back pain   . Chest pain   . DDD (degenerative disc disease), cervical   . Depression   . HTN (hypertension)     Past Surgical History:  Procedure Laterality Date  . AMPUTATION Bilateral 01/09/2016   Procedure: Bilateral Above Knee Amputation, Apply Wound VAC;  Surgeon: Nadara MustardMarcus V Duda, MD;  Location: [facility];  Service: Orthopedics;  Laterality: Bilateral;  . FASCIOTOMY Bilateral 01/03/2016   Procedure: FASCIOTOMY;  Surgeon: Tarry KosNaiping M Xu, MD;  Location: [facility] OR;  Service: Orthopedics;  Laterality: Bilateral;  . I&D EXTREMITY Bilateral 01/05/2016   Procedure: IRRIGATION AND DEBRIDEMENT BILATERAL LOWER EXTREMITIES; WOUND VAC CHANGE;  Surgeon: Tarry KosNaiping M Xu, MD;  Location: [facility] OR;  Service: Orthopedics;  Laterality: Bilateral;  . I&D EXTREMITY Left 01/07/2016   Procedure: Irrigation and Debridement of Left Lower Extremity with Application of Wound Vac;  Surgeon: Tarry KosNaiping M Xu, MD;  Location: [facility] OR;  Service: Orthopedics;  Laterality: Left;  Marland Kitchen. VASECTOMY      There were no vitals filed for this visit.      Subjective Assessment - 06/04/16 1409    Subjective Reports now his long time girlfriend has decided to leave him as of tomorrow, now relying on  mom and dad for everything. Dad is moving in with him for short time so he can keep his house. Had a fall as well out of wheelchair. Was going out building forward over small step while holding a door open and fell forward out of wheelchair. Landed on both residual limbs. No brusing at this time, no open areas.                         Pertinent History DDD, chronic low back pain, HTN, 2 back surgeries   Limitations Lifting;Standing;Walking   Patient Stated Goals to use prostheses to get around in home & community. Go fishing   Currently in Pain? Yes   Pain Score 5    Pain Location Back   Pain Orientation Lower   Pain Descriptors / Indicators Aching;Burning   Pain Type Chronic pain   Pain Onset More than a month ago   Pain Frequency Constant   Aggravating Factors  moving wrong or staying in one position for too long   Pain Relieving Factors medications              [facility] Adult PT Treatment/Exercise - 06/04/16 1417      Transfers   Transfers Sit to Stand;Stand to Sit   Sit to Stand 5: Supervision;With upper extremity assist;With armrests;From chair/3-in-1   Sit to Stand Details Verbal cues for sequencing;Verbal cues for technique;Verbal cues for precautions/safety  Stand to Sit 5: Supervision;With upper extremity assist;To chair/3-in-1   Stand to Sit Details (indicate cue type and reason) Verbal cues for sequencing;Verbal cues for precautions/safety;Verbal cues for technique   Comments wheel chair<> sink x 4 reps as pt needed rest breaks with standing activities.      Prosthetics   Current prosthetic wear tolerance (days/week)  daily   Current prosthetic wear tolerance (#hours/day)  2 hours 2x day for liners. Not always getting prostheses on.   Residual limb condition  intact without issues noted   Education Provided Residual limb care;Proper Donning;Proper Doffing;Proper wear schedule/adjustment;Proper weight-bearing schedule/adjustment  HEP at sink for balance and proprioception    Person(s) Educated Patient   Education Method Explanation;Demonstration;Verbal cues;Handout   Education Method Verbalized understanding;Returned demonstration;Verbal cues required;Needs further instruction   Donning Prosthesis Supervision   Doffing Prosthesis Supervision            PT Education - 06/04/16 1434    Education provided Yes   Education Details HEP at sink for balance and proprioception   Person(s) Educated Patient   Methods Explanation;Demonstration;Verbal cues;Handout   Comprehension Verbalized understanding;Returned demonstration;Verbal cues required;Need further instruction          PT Short Term Goals - 05/29/16 1923      PT SHORT TERM GOAL #1   Title Patient tolerates wear of prostheses >8hrs total /day without skin issues or limb pain. (Target Date: 06/27/2016)   Time 4   Period Weeks   Status New     PT SHORT TERM GOAL #2   Title Patient verbalizes proper cleaning & demo proper donning of prostheses.  (Target Date: 06/27/2016)   Time 4   Period Weeks   Status New     PT SHORT TERM GOAL #3   Title Patient stands with LUE support on walker or crutch 5" & towards ground within 5" of floor with supervision.  (Target Date: 06/27/2016)   Time 4   Period Weeks   Status New     PT SHORT TERM GOAL #4   Title Patient ambulates 650' with RW or crutches & prostheses with minA.  (Target Date: 06/27/2016)   Time 4   Period Weeks   Status New     PT SHORT TERM GOAL #5   Title Patient reports back pain increases </= 3 increments on 0-10 scale with above activities.  (Target Date: 06/27/2016)   Time 4   Period Weeks   Status New           PT Long Term Goals - 05/29/16 1929      PT LONG TERM GOAL #1   Title Patient demonstrates & verbalizes proper prosthetic care to enable safe use of prostheses.  (Target Date: 08/22/2016)   Time 12   Period Weeks   Status New     PT LONG TERM GOAL #2   Title Patient tolerates wear of prostheses >80% of awake hours  without skin issues or limb pain to enable function during his day. (Target Date: 08/22/2016)   Time 12   Period Weeks   Status New     PT LONG TERM GOAL #3   Title Patient has standing balance tolerating standing 2 minutes without UE support turning head to scan environment and with LUE support reaches 7" anteriorly & to floor modified independent. (Target Date: 08/22/2016)   Time 12   Period Weeks   Status New     PT LONG TERM GOAL #4   Title Patient ambulates 300'  with prostheses & LRAD modified independent for community mobility. (Target Date: 08/22/2016)   Time 12   Period Weeks   Status New     PT LONG TERM GOAL #5   Title Patient negotiates ramps, curbs & stairs with LRAD & prostheses modified independent for community access. (Target Date: 08/22/2016)   Time 12   Period Weeks   Status New     Additional Long Term Goals   Additional Long Term Goals Yes     PT LONG TERM GOAL #6   Title Patient reports back pain increases </= 2 increments on 0-10 scale with above activities. (Target Date: 08/22/2016)   Time 12   Period Weeks   Status New            Plan - 06/04/16 1417    Clinical Impression Statement Today's skilled session continued to address limb/prostheses care/management. Also issued HEP at sink for balance and proprioception. Primary PT made aware of pt's request to transfer to clinic closer to home as well as most recent fall from wheelchair. Pt is making slow, steady progress toward goals and should benefit from continued PT to progress toward unmet goals.    Rehab Potential Good   PT Frequency 2x / week   PT Duration 12 weeks   PT Treatment/Interventions ADLs/Self Care Home Management;Electrical Stimulation;Ultrasound;Moist Heat;DME Instruction;Gait training;Stair training;Functional mobility training;Therapeutic activities;Therapeutic exercise;Balance training;Neuromuscular re-education;Patient/family education;Prosthetic Training   PT Next Visit Plan Review  prosthetic care, HEP at sink for midline & balance,    Consulted and Agree with Plan of Care Patient      Patient will benefit from skilled therapeutic intervention in order to improve the following deficits and impairments:  Abnormal gait, Decreased activity tolerance, Decreased balance, Decreased range of motion, Decreased knowledge of use of DME, Decreased endurance, Decreased strength, Improper body mechanics, Postural dysfunction, Prosthetic Dependency, Pain  Visit Diagnosis: Other abnormalities of gait and mobility  Abnormal posture  Unsteadiness on feet  History of falling  Muscle weakness (generalized)     Problem List Patient Active Problem List   Diagnosis Date Noted  . Seasonal allergies 02/08/2016  . Anemia due to other cause 02/08/2016  . Chronic pain syndrome 02/08/2016  . Depression 02/08/2016  . Post-operative pain   . Ischemia of lower extremity 01/23/2016  . Hypoalbuminemia due to protein-calorie malnutrition (HCC)   . Dysuria   . Tobacco abuse   . Constipation due to pain medication   . Chronic low back pain   . Benign essential HTN   . Asthma   . Hyponatremia   . CKD (chronic kidney disease)   . Anemia of chronic disease   . Transaminitis   . Phantom limb syndrome with pain (HCC)   . Status post bilateral above knee amputation (HCC)   . Acute kidney injury (HCC)   . Syncope   . Pressure ulcer 01/12/2016  . Fall   . Encounter for central line placement   . Urinary retention   . AKI (acute kidney injury) (HCC)   . Traumatic rhabdomyolysis (HCC)   . Rhabdomyolysis 01/03/2016  . Nontraumatic compartment syndrome of leg 01/03/2016  . Chest pain 10/27/2012  . HTN (hypertension) 10/27/2012  . Back pain 10/27/2012    Sallyanne Kuster, PTA, [facility] [address], [address] [place], [place] [address] [phone] 06/05/16, 10:44 AM   Name: MAYJOR AGER MRN: [id number] Date of Birth: [birth year]

## 2016-06-06 ENCOUNTER — Encounter: Payer: Self-pay | Admitting: Physical Therapy

## 2016-06-06 ENCOUNTER — Ambulatory Visit: Payer: BLUE CROSS/BLUE SHIELD | Admitting: Physical Therapy

## 2016-06-06 DIAGNOSIS — Z9181 History of falling: Secondary | ICD-10-CM

## 2016-06-06 DIAGNOSIS — R2689 Other abnormalities of gait and mobility: Secondary | ICD-10-CM

## 2016-06-06 DIAGNOSIS — M6281 Muscle weakness (generalized): Secondary | ICD-10-CM

## 2016-06-06 DIAGNOSIS — R293 Abnormal posture: Secondary | ICD-10-CM

## 2016-06-06 DIAGNOSIS — R2681 Unsteadiness on feet: Secondary | ICD-10-CM

## 2016-06-08 NOTE — Therapy (Signed)
[facility] [address] [place], [place], [address] Phone: [phone]   Fax:  [phone]  Physical Therapy Treatment  Patient Details  Name: Jeffery Gross MRN: [id number] Date of Birth: 09-19-1960 Referring Provider: Aldean BakerMarcus Duda, MD  Encounter Date: 06/06/2016   06/06/16 1349  PT Visits / Re-Eval  Visit Number 3  Number of Visits 25  Date for PT Re-Evaluation 08/22/16  Authorization  Authorization Type BCBS  PT Time Calculation  PT Start Time 1230  PT Stop Time 1250 (ended early due to medical issues)  PT Time Calculation (min) 20 min  PT - End of Session  Equipment Utilized During Treatment Gait belt  Activity Tolerance Patient limited by pain  Behavior During Therapy [facility] for tasks assessed/performed     Past Medical History:  Diagnosis Date  . Back pain   . Chest pain   . DDD (degenerative disc disease), cervical   . Depression   . HTN (hypertension)     Past Surgical History:  Procedure Laterality Date  . AMPUTATION Bilateral 01/09/2016   Procedure: Bilateral Above Knee Amputation, Apply Wound VAC;  Surgeon: Nadara MustardMarcus V Duda, MD;  Location: [facility];  Service: Orthopedics;  Laterality: Bilateral;  . FASCIOTOMY Bilateral 01/03/2016   Procedure: FASCIOTOMY;  Surgeon: Tarry KosNaiping M Xu, MD;  Location: [facility];  Service: Orthopedics;  Laterality: Bilateral;  . I&D EXTREMITY Bilateral 01/05/2016   Procedure: IRRIGATION AND DEBRIDEMENT BILATERAL LOWER EXTREMITIES; WOUND VAC CHANGE;  Surgeon: Tarry KosNaiping M Xu, MD;  Location: [facility];  Service: Orthopedics;  Laterality: Bilateral;  . I&D EXTREMITY Left 01/07/2016   Procedure: Irrigation and Debridement of Left Lower Extremity with Application of Wound Vac;  Surgeon: Tarry KosNaiping M Xu, MD;  Location: [facility];  Service: Orthopedics;  Laterality: Left;  Marland Kitchen. VASECTOMY      There were no vitals filed for this visit.   06/06/16 1233  Symptoms/Limitations  Subjective Reports the pain in  his distal ends of limbs from his fall the other day, 8/10 today. Also, reports his girlfriend has had a change of heart and is moving back in today for them to work things out, which means transportation will not be an issue. PT limbs checked and found to have bruising/discolored areas that were not there the other day.                            Patient is accompained by: Family member (mom in lobby)  Pertinent History DDD, chronic low back pain, HTN, 2 back surgeries  Limitations Lifting;Standing;Walking  Patient Stated Goals to use prostheses to get around in home & community. Go fishing  Pain Assessment  Currently in Pain? Yes  Pain Score 8  Pain Location Leg (back)  Pain Orientation Right;Left  Pain Descriptors / Indicators Aching;Sore  Pain Type Chronic pain;Acute pain  Pain Onset More than a month ago  Pain Frequency Constant  Aggravating Factors  back: moving wrong and immobility. legs: recent fall  Pain Relieving Factors medications, rest          PT Short Term Goals - 05/29/16 1923      PT SHORT TERM GOAL #1   Title Patient tolerates wear of prostheses >8hrs total /day without skin issues or limb pain. (Target Date: 06/27/2016)   Time 4   Period Weeks   Status New     PT SHORT TERM GOAL #2   Title Patient verbalizes proper cleaning &  demo proper donning of prostheses.  (Target Date: 06/27/2016)   Time 4   Period Weeks   Status New     PT SHORT TERM GOAL #3   Title Patient stands with LUE support on walker or crutch 5" & towards ground within 5" of floor with supervision.  (Target Date: 06/27/2016)   Time 4   Period Weeks   Status New     PT SHORT TERM GOAL #4   Title Patient ambulates 2850' with RW or crutches & prostheses with minA.  (Target Date: 06/27/2016)   Time 4   Period Weeks   Status New     PT SHORT TERM GOAL #5   Title Patient reports back pain increases </= 3 increments on 0-10 scale with above activities.  (Target Date: 06/27/2016)   Time 4    Period Weeks   Status New           PT Long Term Goals - 05/29/16 1929      PT LONG TERM GOAL #1   Title Patient demonstrates & verbalizes proper prosthetic care to enable safe use of prostheses.  (Target Date: 08/22/2016)   Time 12   Period Weeks   Status New     PT LONG TERM GOAL #2   Title Patient tolerates wear of prostheses >80% of awake hours without skin issues or limb pain to enable function during his day. (Target Date: 08/22/2016)   Time 12   Period Weeks   Status New     PT LONG TERM GOAL #3   Title Patient has standing balance tolerating standing 2 minutes without UE support turning head to scan environment and with LUE support reaches 7" anteriorly & to floor modified independent. (Target Date: 08/22/2016)   Time 12   Period Weeks   Status New     PT LONG TERM GOAL #4   Title Patient ambulates 300' with prostheses & LRAD modified independent for community mobility. (Target Date: 08/22/2016)   Time 12   Period Weeks   Status New     PT LONG TERM GOAL #5   Title Patient negotiates ramps, curbs & stairs with LRAD & prostheses modified independent for community access. (Target Date: 08/22/2016)   Time 12   Period Weeks   Status New     Additional Long Term Goals   Additional Long Term Goals Yes     PT LONG TERM GOAL #6   Title Patient reports back pain increases </= 2 increments on 0-10 scale with above activities. (Target Date: 08/22/2016)   Time 12   Period Weeks   Status New        06/06/16 1350  Plan  Clinical Impression Statement Pt arrived today with increased bil limb pain and bruising present. Call placed to Dr. Audrie Liauda's office and spoke with Denny PeonErin, his assistant. At this time pt was advised to keep off limbs/avoid weight bearing with prostheses until pain gets better. Pt advised to call Md office if symptoms got worse or do not get better over next several days. Pt verbalized understanding. Session limited today to self care only due to this and Md  recommendations. Pt should benefit from continued PT to progress toward unmet goals.  Pt will benefit from skilled therapeutic intervention in order to improve on the following deficits Abnormal gait;Decreased activity tolerance;Decreased balance;Decreased range of motion;Decreased knowledge of use of DME;Decreased endurance;Decreased strength;Improper body mechanics;Postural dysfunction;Prosthetic Dependency;Pain  Rehab Potential Good  PT Frequency 2x / week  PT Duration 12 weeks  PT Treatment/Interventions ADLs/Self Care Home Management;Electrical Stimulation;Ultrasound;Moist Heat;DME Instruction;Gait training;Stair training;Functional mobility training;Therapeutic activities;Therapeutic exercise;Balance training;Neuromuscular re-education;Patient/family education;Prosthetic Training  PT Next Visit Plan Review prosthetic care, HEP at sink for midline & balance,   Consulted and Agree with Plan of Care Patient       Patient will benefit from skilled therapeutic intervention in order to improve the following deficits and impairments:  Abnormal gait, Decreased activity tolerance, Decreased balance, Decreased range of motion, Decreased knowledge of use of DME, Decreased endurance, Decreased strength, Improper body mechanics, Postural dysfunction, Prosthetic Dependency, Pain  Visit Diagnosis: Other abnormalities of gait and mobility  Abnormal posture  Unsteadiness on feet  History of falling  Muscle weakness (generalized)     Problem List Patient Active Problem List   Diagnosis Date Noted  . Seasonal allergies 02/08/2016  . Anemia due to other cause 02/08/2016  . Chronic pain syndrome 02/08/2016  . Depression 02/08/2016  . Post-operative pain   . Ischemia of lower extremity 01/23/2016  . Hypoalbuminemia due to protein-calorie malnutrition (HCC)   . Dysuria   . Tobacco abuse   . Constipation due to pain medication   . Chronic low  back pain   . Benign essential HTN   . Asthma   . Hyponatremia   . CKD (chronic kidney disease)   . Anemia of chronic disease   . Transaminitis   . Phantom limb syndrome with pain (HCC)   . Status post bilateral above knee amputation (HCC)   . Acute kidney injury (HCC)   . Syncope   . Pressure ulcer 01/12/2016  . Fall   . Encounter for central line placement   . Urinary retention   . AKI (acute kidney injury) (HCC)   . Traumatic rhabdomyolysis (HCC)   . Rhabdomyolysis 01/03/2016  . Nontraumatic compartment syndrome of leg 01/03/2016  . Chest pain 10/27/2012  . HTN (hypertension) 10/27/2012  . Back pain 10/27/2012    Sallyanne Kuster, PTA, [facility] Outpatient Neuro [facility] [address], [address] [place], [place] [address] [phone] 06/08/16, 1:56 PM   Name: Jeffery Gross MRN: [id number] Date of Birth: 24-Jul-1960

## 2016-06-18 ENCOUNTER — Encounter: Payer: Self-pay | Admitting: Physical Therapy

## 2016-06-18 ENCOUNTER — Ambulatory Visit: Payer: BLUE CROSS/BLUE SHIELD | Admitting: Physical Therapy

## 2016-06-18 DIAGNOSIS — Z9181 History of falling: Secondary | ICD-10-CM

## 2016-06-18 DIAGNOSIS — G8929 Other chronic pain: Secondary | ICD-10-CM

## 2016-06-18 DIAGNOSIS — M25652 Stiffness of left hip, not elsewhere classified: Secondary | ICD-10-CM

## 2016-06-18 DIAGNOSIS — R2689 Other abnormalities of gait and mobility: Secondary | ICD-10-CM | POA: Diagnosis not present

## 2016-06-18 DIAGNOSIS — R2681 Unsteadiness on feet: Secondary | ICD-10-CM

## 2016-06-18 DIAGNOSIS — M545 Low back pain: Secondary | ICD-10-CM

## 2016-06-18 DIAGNOSIS — R293 Abnormal posture: Secondary | ICD-10-CM

## 2016-06-18 DIAGNOSIS — M25651 Stiffness of right hip, not elsewhere classified: Secondary | ICD-10-CM

## 2016-06-18 DIAGNOSIS — M6281 Muscle weakness (generalized): Secondary | ICD-10-CM

## 2016-06-18 NOTE — Therapy (Signed)
[facility] [address] [place], [place], [address] Phone: [phone]   Fax:  [phone]  Physical Therapy Treatment  Patient Details  Name: Jeffery HarbourScott D Erlich MRN: [id number] Date of Birth: August 30, 1960 Referring Provider: Aldean BakerMarcus Duda, MD  Encounter Date: 06/18/2016      PT End of Session - 06/18/16 1447    Visit Number 4   Number of Visits 25   Date for PT Re-Evaluation 08/22/16   Authorization Type BCBS   PT Start Time 1405   PT Stop Time 1445   PT Time Calculation (min) 40 min   Equipment Utilized During Treatment Gait belt   Activity Tolerance Patient limited by pain   Behavior [facility] for tasks assessed/performed      Past Medical History:  Diagnosis Date  . Back pain   . Chest pain   . DDD (degenerative disc disease), cervical   . Depression   . HTN (hypertension)     Past Surgical History:  Procedure Laterality Date  . AMPUTATION Bilateral 01/09/2016   Procedure: Bilateral Above Knee Amputation, Apply Wound VAC;  Surgeon: Nadara MustardMarcus V Duda, MD;  Location: [facility];  Service: Orthopedics;  Laterality: Bilateral;  . FASCIOTOMY Bilateral 01/03/2016   Procedure: FASCIOTOMY;  Surgeon: Tarry KosNaiping M Xu, MD;  Location: [facility] OR;  Service: Orthopedics;  Laterality: Bilateral;  . I&D EXTREMITY Bilateral 01/05/2016   Procedure: IRRIGATION AND DEBRIDEMENT BILATERAL LOWER EXTREMITIES; WOUND VAC CHANGE;  Surgeon: Tarry KosNaiping M Xu, MD;  Location: [facility] OR;  Service: Orthopedics;  Laterality: Bilateral;  . I&D EXTREMITY Left 01/07/2016   Procedure: Irrigation and Debridement of Left Lower Extremity with Application of Wound Vac;  Surgeon: Tarry KosNaiping M Xu, MD;  Location: [facility] OR;  Service: Orthopedics;  Laterality: Left;  Marland Kitchen. VASECTOMY      There were no vitals filed for this visit.      Subjective Assessment - 06/18/16 1408    Subjective Residual limbs are much better since fall.   Patient is accompained by: Family member  mom  in lobby   Pertinent History DDD, chronic low back pain, HTN, 2 back surgeries   Limitations Lifting;Standing;Walking   Patient Stated Goals to use prostheses to get around in home & community. Go fishing   Currently in Pain? Yes   Pain Score 3    Pain Location Leg   Pain Orientation Right;Left   Pain Descriptors / Indicators Sore   Pain Type Chronic pain   Pain Onset More than a month ago   Pain Frequency Intermittent                         [facility] Adult PT Treatment/Exercise - 06/18/16 0001      Transfers   Transfers Sit to Stand;Stand to Sit   Sit to Stand 5: Supervision;With upper extremity assist;With armrests;From chair/3-in-1   Sit to Stand Details Visual cues for safe use of DME/AE;Verbal cues for precautions/safety   Sit to Stand Details (indicate cue type and reason) multiple reps: sit<> stands, Stand-pivots with RW and 180 turns to sit.     Ambulation/Gait   Ambulation/Gait Yes   Ambulation/Gait Assistance 4: Min guard   Ambulation/Gait Assistance Details Working on gait simulating household environment with turns   Ambulation Distance (Feet) 30 Feet  x2 + 20 x2   Assistive device Prostheses   Gait Pattern Step-through pattern;Decreased stride length;Right hip hike;Left hip hike;Right circumduction;Left circumduction;Trunk flexed   Ambulation Surface Level;Indoor  Prosthetics   Prosthetic Care Comments  Instructed to put lotion on at night then clean off in the morning and then use antiperspirent in the morning.     Increase wear time to 3hrs 2x/day   Current prosthetic wear tolerance (days/week)  daily   Current prosthetic wear tolerance (#hours/day)  <425min limited by back pain.   Edema non-pitting   Residual limb condition  not some small scratches on left residual limb where pt may have been rubbing due to skin being ichy.   Education Provided Residual limb care;Proper Donning;Proper Doffing;Proper wear schedule/adjustment;Proper weight-bearing  schedule/adjustment   Person(s) Educated Patient   Education Method Explanation   Education Method Verbalized understanding   Donning Prosthesis Supervision   Doffing Prosthesis Modified independent (device/increased time)                PT Education - 06/18/16 1555    Education provided Yes   Education Details Discussed STGs to work towards; Safety training for girlfriend to walk with pt in the home with RW and gait belt.   Person(s) Educated Patient;Spouse   Methods Explanation;Demonstration;Tactile cues;Verbal cues   Comprehension Verbalized understanding;Returned demonstration;Verbal cues required;Tactile cues required;Need further instruction          PT Short Term Goals - 05/29/16 1923      PT SHORT TERM GOAL #1   Title Patient tolerates wear of prostheses >8hrs total /day without skin issues or limb pain. (Target Date: 06/27/2016)   Time 4   Period Weeks   Status New     PT SHORT TERM GOAL #2   Title Patient verbalizes proper cleaning & demo proper donning of prostheses.  (Target Date: 06/27/2016)   Time 4   Period Weeks   Status New     PT SHORT TERM GOAL #3   Title Patient stands with LUE support on walker or crutch 5" & towards ground within 5" of floor with supervision.  (Target Date: 06/27/2016)   Time 4   Period Weeks   Status New     PT SHORT TERM GOAL #4   Title Patient ambulates 3650' with RW or crutches & prostheses with minA.  (Target Date: 06/27/2016)   Time 4   Period Weeks   Status New     PT SHORT TERM GOAL #5   Title Patient reports back pain increases </= 3 increments on 0-10 scale with above activities.  (Target Date: 06/27/2016)   Time 4   Period Weeks   Status New           PT Long Term Goals - 05/29/16 1929      PT LONG TERM GOAL #1   Title Patient demonstrates & verbalizes proper prosthetic care to enable safe use of prostheses.  (Target Date: 08/22/2016)   Time 12   Period Weeks   Status New     PT LONG TERM GOAL #2    Title Patient tolerates wear of prostheses >80% of awake hours without skin issues or limb pain to enable function during his day. (Target Date: 08/22/2016)   Time 12   Period Weeks   Status New     PT LONG TERM GOAL #3   Title Patient has standing balance tolerating standing 2 minutes without UE support turning head to scan environment and with LUE support reaches 7" anteriorly & to floor modified independent. (Target Date: 08/22/2016)   Time 12   Period Weeks   Status New     PT LONG TERM GOAL #  4   Title Patient ambulates 300' with prostheses & LRAD modified independent for community mobility. (Target Date: 08/22/2016)   Time 12   Period Weeks   Status New     PT LONG TERM GOAL #5   Title Patient negotiates ramps, curbs & stairs with LRAD & prostheses modified independent for community access. (Target Date: 08/22/2016)   Time 12   Period Weeks   Status New     Additional Long Term Goals   Additional Long Term Goals Yes     PT LONG TERM GOAL #6   Title Patient reports back pain increases </= 2 increments on 0-10 scale with above activities. (Target Date: 08/22/2016)   Time 12   Period Weeks   Status New               Plan - 06/18/16 1607    Clinical Impression Statement No bruising on residual limbs this visit.  Pt has been working on standing and transfer at home with prostheses.  Pt progressed gait today to 58ft multiple times and is ready to work on gait at home with min guard and RW.   Rehab Potential Good   PT Frequency 2x / week   PT Duration 12 weeks   PT Treatment/Interventions ADLs/Self Care Home Management;Electrical Stimulation;Ultrasound;Moist Heat;DME Instruction;Gait training;Stair training;Functional mobility training;Therapeutic activities;Therapeutic exercise;Balance training;Neuromuscular re-education;Patient/family education;Prosthetic Training   PT Next Visit Plan gait and standing balance/endurance   Consulted and Agree with Plan of Care Patient       Patient will benefit from skilled therapeutic intervention in order to improve the following deficits and impairments:  Abnormal gait, Decreased activity tolerance, Decreased balance, Decreased range of motion, Decreased knowledge of use of DME, Decreased endurance, Decreased strength, Improper body mechanics, Postural dysfunction, Prosthetic Dependency, Pain  Visit Diagnosis: Abnormal posture  Unsteadiness on feet  Muscle weakness (generalized)  Stiffness of left hip, not elsewhere classified  Stiffness of right hip, not elsewhere classified  Chronic midline low back pain, with sciatica presence unspecified  History of falling     Problem List Patient Active Problem List   Diagnosis Date Noted  . Seasonal allergies 02/08/2016  . Anemia due to other cause 02/08/2016  . Chronic pain syndrome 02/08/2016  . Depression 02/08/2016  . Post-operative pain   . Ischemia of lower extremity 01/23/2016  . Hypoalbuminemia due to protein-calorie malnutrition (HCC)   . Dysuria   . Tobacco abuse   . Constipation due to pain medication   . Chronic low back pain   . Benign essential HTN   . Asthma   . Hyponatremia   . CKD (chronic kidney disease)   . Anemia of chronic disease   . Transaminitis   . Phantom limb syndrome with pain (HCC)   . Status post bilateral above knee amputation (HCC)   . Acute kidney injury (HCC)   . Syncope   . Pressure ulcer 01/12/2016  . Fall   . Encounter for central line placement   . Urinary retention   . AKI (acute kidney injury) (HCC)   . Traumatic rhabdomyolysis (HCC)   . Rhabdomyolysis 01/03/2016  . Nontraumatic compartment syndrome of leg 01/03/2016  . Chest pain 10/27/2012  . HTN (hypertension) 10/27/2012  . Back pain 10/27/2012    Hortencia Conradi, PTA  06/18/16, 4:11 PM [facility] [address] [place], [place], [address] Phone: [phone]   Fax:  [phone]  Name: DARRYON BASTIN MRN: [id number] Date of Birth:  04/08/1961    

## 2016-06-20 ENCOUNTER — Encounter: Payer: Self-pay | Admitting: Physical Therapy

## 2016-06-20 ENCOUNTER — Ambulatory Visit: Payer: BLUE CROSS/BLUE SHIELD | Attending: Orthopedic Surgery | Admitting: Physical Therapy

## 2016-06-20 DIAGNOSIS — M6281 Muscle weakness (generalized): Secondary | ICD-10-CM | POA: Diagnosis present

## 2016-06-20 DIAGNOSIS — R2689 Other abnormalities of gait and mobility: Secondary | ICD-10-CM | POA: Diagnosis present

## 2016-06-20 DIAGNOSIS — R2681 Unsteadiness on feet: Secondary | ICD-10-CM | POA: Insufficient documentation

## 2016-06-20 DIAGNOSIS — R293 Abnormal posture: Secondary | ICD-10-CM | POA: Diagnosis present

## 2016-06-20 DIAGNOSIS — M25651 Stiffness of right hip, not elsewhere classified: Secondary | ICD-10-CM | POA: Insufficient documentation

## 2016-06-20 DIAGNOSIS — M25652 Stiffness of left hip, not elsewhere classified: Secondary | ICD-10-CM | POA: Insufficient documentation

## 2016-06-20 DIAGNOSIS — M545 Low back pain: Secondary | ICD-10-CM | POA: Insufficient documentation

## 2016-06-20 DIAGNOSIS — Z9181 History of falling: Secondary | ICD-10-CM | POA: Insufficient documentation

## 2016-06-20 DIAGNOSIS — G8929 Other chronic pain: Secondary | ICD-10-CM | POA: Diagnosis present

## 2016-06-20 NOTE — Therapy (Signed)
[facility] [address] [place], [place], [address] Phone: [phone]   Fax:  [phone]  Physical Therapy Treatment  Patient Details  Name: Jeffery Gross MRN: [id number] Date of Birth: [birth year] Referring Provider: Aldean BakerMarcus Duda, MD  Encounter Date: 06/20/2016      PT End of Session - 06/20/16 1235    Visit Number 5   Number of Visits 25   Date for PT Re-Evaluation 08/22/16   Authorization Type BCBS   PT Start Time 1231   PT Stop Time 1310   PT Time Calculation (min) 39 min   Equipment Utilized During Treatment Gait belt   Activity Tolerance Patient limited by pain   Behavior During Therapy [facility] for tasks assessed/performed      Past Medical History:  Diagnosis Date  . Back pain   . Chest pain   . DDD (degenerative disc disease), cervical   . Depression   . HTN (hypertension)     Past Surgical History:  Procedure Laterality Date  . AMPUTATION Bilateral 01/09/2016   Procedure: Bilateral Above Knee Amputation, Apply Wound VAC;  Surgeon: Nadara MustardMarcus V Duda, MD;  [facility];  Service: Orthopedics;  Laterality: Bilateral;  . FASCIOTOMY Bilateral 01/03/2016   Procedure: FASCIOTOMY;  Surgeon: Tarry KosNaiping M Xu, MD;  [facility]: [facility];  Service: Orthopedics;  Laterality: Bilateral;  . I&D EXTREMITY Bilateral 01/05/2016   Procedure: IRRIGATION AND DEBRIDEMENT BILATERAL LOWER EXTREMITIES; WOUND VAC CHANGE;  Surgeon: Tarry KosNaiping M Xu, MD;  [facility]: [facility];  Service: Orthopedics;  Laterality: Bilateral;  . I&D EXTREMITY Left 01/07/2016   Procedure: Irrigation and Debridement of Left Lower Extremity with Application of Wound Vac;  Surgeon: Tarry KosNaiping M Xu, MD;  [facility]: [facility];  Service: Orthopedics;  Laterality: Left;  Marland Kitchen. VASECTOMY      There were no vitals filed for this visit.      Subjective Assessment - 06/20/16 1234    Subjective No new complaints. No falls to report, or pain.   Patient is accompained by: Family member   girlfriend with pt   Pertinent History DDD, chronic low back pain, HTN, 2 back surgeries   Limitations Lifting;Standing;Walking   Patient Stated Goals to use prostheses to get around in home & community. Go fishing   Currently in Pain? No/denies   Pain Score 0-No pain           [facility] Adult PT Treatment/Exercise - 06/20/16 1236      Transfers   Transfers Sit to Stand;Stand to Sit   Sit to Stand 5: Supervision;With upper extremity assist;With armrests;From chair/3-in-1   Sit to Stand Details Verbal cues for technique;Verbal cues for sequencing;Verbal cues for precautions/safety;Verbal cues for safe use of DME/AE   Sit to Stand Details (indicate cue type and reason) cues to scoot out till feet rest on ground and for weight shifting with standing   Stand to Sit 5: Supervision;With upper extremity assist;To chair/3-in-1   Stand to Sit Details (indicate cue type and reason) Verbal cues for sequencing;Verbal cues for technique;Verbal cues for precautions/safety;Verbal cues for safe use of DME/AE   Stand to Sit Details cues to fully align self at chair surface before reaching back to sit down.     Ambulation/Gait   Ambulation/Gait Yes   Ambulation/Gait Assistance 4: Min assist;4: Min guard   Ambulation/Gait Assistance Details cues on posture, walker position with gait and step length/placement with gait. Pt with heavy UE reliance on RW with gait.    Ambulation Distance (  Feet) 60 Feet  x1, 65 x1   Assistive device Rolling walker;Prostheses   Gait Pattern Step-through pattern;Decreased stride length;Right hip hike;Left hip hike;Right circumduction;Left circumduction;Trunk flexed   Ambulation Surface Level;Indoor     Neuro Re-ed    Neuro Re-ed Details  standing in parallel bars: alternating UE raises x 10 each side, bil simultaneous UE raises x 10 reps, with red theraband UE rows x 10 reps, alternating upper trunk rotation left<>right x 5 each way (reaching back behind him with one UE and  holding on bars with other UE and mini back extensions x 5 reps. min guard to min assist for balance with cues on ex form and technique.                                 Prosthetics   Prosthetic Care Comments  reinforced use of anti-persperant to decrease heat rash/itching. Pt reports he is using this and lotion at night only. Wipes lotion off in am before donning liner/prostheses.   Current prosthetic wear tolerance (days/week)  daily   Current prosthetic wear tolerance (#hours/day)  goal is 3 hours 2x day which started on 06/19/16. pt is working towards that, not able to today due to multiple MD appts.   Residual limb condition  bil intact without any issues   Education Provided Residual limb care;Correct ply sock adjustment;Proper Donning;Proper Doffing;Proper wear schedule/adjustment;Proper weight-bearing schedule/adjustment   Person(s) Educated Patient   Education Method Explanation;Demonstration;Verbal cues;Tactile cues   Education Method Verbalized understanding;Needs further instruction;Verbal cues required;Tactile cues required   Donning Prosthesis Supervision   Doffing Prosthesis Modified independent (device/increased time)             PT Short Term Goals - 05/29/16 1923      PT SHORT TERM GOAL #1   Title Patient tolerates wear of prostheses >8hrs total /day without skin issues or limb pain. (Target Date: 06/27/2016)   Time 4   Period Weeks   Status New     PT SHORT TERM GOAL #2   Title Patient verbalizes proper cleaning & demo proper donning of prostheses.  (Target Date: 06/27/2016)   Time 4   Period Weeks   Status New     PT SHORT TERM GOAL #3   Title Patient stands with LUE support on walker or crutch 5" & towards ground within 5" of floor with supervision.  (Target Date: 06/27/2016)   Time 4   Period Weeks   Status New     PT SHORT TERM GOAL #4   Title Patient ambulates 64' with RW or crutches & prostheses with minA.  (Target Date: 06/27/2016)   Time 4   Period  Weeks   Status New     PT SHORT TERM GOAL #5   Title Patient reports back pain increases </= 3 increments on 0-10 scale with above activities.  (Target Date: 06/27/2016)   Time 4   Period Weeks   Status New           PT Long Term Goals - 05/29/16 1929      PT LONG TERM GOAL #1   Title Patient demonstrates & verbalizes proper prosthetic care to enable safe use of prostheses.  (Target Date: 08/22/2016)   Time 12   Period Weeks   Status New     PT LONG TERM GOAL #2   Title Patient tolerates wear of prostheses >80% of awake hours without skin issues or  limb pain to enable function during his day. (Target Date: 08/22/2016)   Time 12   Period Weeks   Status New     PT LONG TERM GOAL #3   Title Patient has standing balance tolerating standing 2 minutes without UE support turning head to scan environment and with LUE support reaches 7" anteriorly & to floor modified independent. (Target Date: 08/22/2016)   Time 12   Period Weeks   Status New     PT LONG TERM GOAL #4   Title Patient ambulates 300' with prostheses & LRAD modified independent for community mobility. (Target Date: 08/22/2016)   Time 12   Period Weeks   Status New     PT LONG TERM GOAL #5   Title Patient negotiates ramps, curbs & stairs with LRAD & prostheses modified independent for community access. (Target Date: 08/22/2016)   Time 12   Period Weeks   Status New     Additional Long Term Goals   Additional Long Term Goals Yes     PT LONG TERM GOAL #6   Title Patient reports back pain increases </= 2 increments on 0-10 scale with above activities. (Target Date: 08/22/2016)   Time 12   Period Weeks   Status New            Plan - 06/20/16 1235    Clinical Impression Statement Today's skilled session continued to address gait and balance with bil preshortened prostheses. No increase in back or LE pain reported, however pt does report that bil shoulders/arms (right > left) fatigue quickly with standing activities. Pt  is making steady progress toward goals and should benefit from continued PT to progress toward unmet goals.                          Rehab Potential Good   PT Frequency 2x / week   PT Duration 12 weeks   PT Treatment/Interventions ADLs/Self Care Home Management;Electrical Stimulation;Ultrasound;Moist Heat;DME Instruction;Gait training;Stair training;Functional mobility training;Therapeutic activities;Therapeutic exercise;Balance training;Neuromuscular re-education;Patient/family education;Prosthetic Training   PT Next Visit Plan gait and standing balance/endurance   Consulted and Agree with Plan of Care Patient      Patient will benefit from skilled therapeutic intervention in order to improve the following deficits and impairments:  Abnormal gait, Decreased activity tolerance, Decreased balance, Decreased range of motion, Decreased knowledge of use of DME, Decreased endurance, Decreased strength, Improper body mechanics, Postural dysfunction, Prosthetic Dependency, Pain  Visit Diagnosis: Unsteadiness on feet  Muscle weakness (generalized)  Other abnormalities of gait and mobility  History of falling  Abnormal posture  Stiffness of left hip, not elsewhere classified  Stiffness of right hip, not elsewhere classified  Chronic midline low back pain, with sciatica presence unspecified     Problem List Patient Active Problem List   Diagnosis Date Noted  . Seasonal allergies 02/08/2016  . Anemia due to other cause 02/08/2016  . Chronic pain syndrome 02/08/2016  . Depression 02/08/2016  . Post-operative pain   . Ischemia of lower extremity 01/23/2016  . Hypoalbuminemia due to protein-calorie malnutrition (HCC)   . Dysuria   . Tobacco abuse   . Constipation due to pain medication   . Chronic low back pain   . Benign essential HTN   . Asthma   . Hyponatremia   . CKD (chronic kidney disease)   . Anemia of chronic disease   . Transaminitis   . Phantom limb syndrome with pain  (HCC)   .  Status post bilateral above knee amputation (HCC)   . Acute kidney injury (HCC)   . Syncope   . Pressure ulcer 01/12/2016  . Fall   . Encounter for central line placement   . Urinary retention   . AKI (acute kidney injury) (HCC)   . Traumatic rhabdomyolysis (HCC)   . Rhabdomyolysis 01/03/2016  . Nontraumatic compartment syndrome of leg 01/03/2016  . Chest pain 10/27/2012  . HTN (hypertension) 10/27/2012  . Back pain 10/27/2012    Sallyanne Kuster, PTA, [facility] [address], [address] [place], [place] [address] [phone] 06/20/16, 2:10 PM   Name: Jeffery Gross MRN: [id number] Date of Birth: [birth year]

## 2016-06-23 ENCOUNTER — Encounter: Payer: Self-pay | Admitting: Physical Therapy

## 2016-06-23 ENCOUNTER — Ambulatory Visit: Payer: BLUE CROSS/BLUE SHIELD | Admitting: Physical Therapy

## 2016-06-23 DIAGNOSIS — G8929 Other chronic pain: Secondary | ICD-10-CM

## 2016-06-23 DIAGNOSIS — R2689 Other abnormalities of gait and mobility: Secondary | ICD-10-CM

## 2016-06-23 DIAGNOSIS — M25651 Stiffness of right hip, not elsewhere classified: Secondary | ICD-10-CM

## 2016-06-23 DIAGNOSIS — R293 Abnormal posture: Secondary | ICD-10-CM

## 2016-06-23 DIAGNOSIS — Z9181 History of falling: Secondary | ICD-10-CM

## 2016-06-23 DIAGNOSIS — R2681 Unsteadiness on feet: Secondary | ICD-10-CM

## 2016-06-23 DIAGNOSIS — M6281 Muscle weakness (generalized): Secondary | ICD-10-CM

## 2016-06-23 DIAGNOSIS — M545 Low back pain: Secondary | ICD-10-CM

## 2016-06-23 DIAGNOSIS — M25652 Stiffness of left hip, not elsewhere classified: Secondary | ICD-10-CM

## 2016-06-23 NOTE — Therapy (Signed)
[facility] [address] [place], [place], [address] Phone: [phone]   Fax:  [phone]  Physical Therapy Treatment  Patient Details  Name: Jeffery Gross MRN: [id number] Date of Birth: 10-25-1960 Referring Provider: Aldean BakerMarcus Duda, MD  Encounter Date: 06/23/2016      PT End of Session - 06/23/16 1236    Visit Number 6   Number of Visits 25   Date for PT Re-Evaluation 08/22/16   Authorization Type BCBS   PT Start Time 1233   PT Stop Time 1314   PT Time Calculation (min) 41 min   Equipment Utilized During Treatment Gait belt   Activity Tolerance Patient limited by pain   Behavior [facility] for tasks assessed/performed      Past Medical History:  Diagnosis Date  . Back pain   . Chest pain   . DDD (degenerative disc disease), cervical   . Depression   . HTN (hypertension)     Past Surgical History:  Procedure Laterality Date  . AMPUTATION Bilateral 01/09/2016   Procedure: Bilateral Above Knee Amputation, Apply Wound VAC;  Surgeon: Nadara MustardMarcus V Duda, MD;  Location: [facility];  Service: Orthopedics;  Laterality: Bilateral;  . FASCIOTOMY Bilateral 01/03/2016   Procedure: FASCIOTOMY;  Surgeon: Tarry KosNaiping M Xu, MD;  Location: [facility];  Service: Orthopedics;  Laterality: Bilateral;  . I&D EXTREMITY Bilateral 01/05/2016   Procedure: IRRIGATION AND DEBRIDEMENT BILATERAL LOWER EXTREMITIES; WOUND VAC CHANGE;  Surgeon: Tarry KosNaiping M Xu, MD;  Location: [facility];  Service: Orthopedics;  Laterality: Bilateral;  . I&D EXTREMITY Left 01/07/2016   Procedure: Irrigation and Debridement of Left Lower Extremity with Application of Wound Vac;  Surgeon: Tarry KosNaiping M Xu, MD;  Location: [facility];  Service: Orthopedics;  Laterality: Left;  Marland Kitchen. VASECTOMY      There were no vitals filed for this visit.      Subjective Assessment - 06/23/16 1236    Subjective No new complaints. No falls to report, or pain.   Patient is accompained by: Family member   girlfriend with pt today   Pertinent History DDD, chronic low back pain, HTN, 2 back surgeries   Limitations Lifting;Standing;Walking   Patient Stated Goals to use prostheses to get around in home & community. Go fishing   Currently in Pain? No/denies   Pain Score 0-No pain            [facility] Adult PT Treatment/Exercise - 06/23/16 1237      Transfers   Transfers Sit to Stand;Stand to Sit   Sit to Stand 5: Supervision;With upper extremity assist;With armrests;From chair/3-in-1   Sit to Stand Details Verbal cues for technique;Verbal cues for sequencing;Verbal cues for precautions/safety;Verbal cues for safe use of DME/AE   Sit to Stand Details (indicate cue type and reason) cues to ensure foot placement prior to standing   Stand to Sit 5: Supervision;With upper extremity assist;To chair/3-in-1   Stand to Sit Details (indicate cue type and reason) Verbal cues for sequencing;Verbal cues for technique;Verbal cues for precautions/safety;Verbal cues for safe use of DME/AE   Stand to Sit Details cues to fully turn and align with chair surface prior to sitting down     Ambulation/Gait   Ambulation/Gait Yes   Ambulation/Gait Assistance 4: Min guard   Ambulation/Gait Assistance Details cues on posture, step length, base of support and walker position with gait.    Ambulation Distance (Feet) 85 Feet  x 2 reps   Assistive device Rolling walker;Prostheses   Gait  Pattern Step-through pattern;Decreased stride length;Right hip hike;Left hip hike;Right circumduction;Left circumduction;Trunk flexed  right prosthesis rotating with stance into swing phase   Ambulation Surface Level;Indoor     Neuro Re-ed    Neuro Re-ed Details  standing with RW in front of pt and w/c behind pt: fwd bean bag toss to basket, alternating sides with cues/emphasis on weight shifting, bags located at different distances laterally on stools- min guard to min assist for balance with cues on posture and weight shifing, no UE  support on RW; with red theraband: shoulder horizontal abduction, alternating diagonals, alternating punching forward and simultaneous UE punching forward x 10 each with seated rest break x 1 in middle. min guard to min assist with cues on posture, weight shifting and ex form.                   Prosthetics   Current prosthetic wear tolerance (days/week)  daily   Current prosthetic wear tolerance (#hours/day)  5-6 hours total with break during day.    Residual limb condition  bil intact without any issues per pt report   Education Provided Proper wear schedule/adjustment;Proper weight-bearing schedule/adjustment;Correct ply sock adjustment   Person(s) Educated Patient;Other (comment)  girl friend   Education Method Explanation;Demonstration;Verbal cues   Education Method Verbalized understanding;Verbal cues required;Needs further instruction   Donning Prosthesis Supervision   Doffing Prosthesis Modified independent (device/increased time)             PT Short Term Goals - 05/29/16 1923      PT SHORT TERM GOAL #1   Title Patient tolerates wear of prostheses >8hrs total /day without skin issues or limb pain. (Target Date: 06/27/2016)   Time 4   Period Weeks   Status New     PT SHORT TERM GOAL #2   Title Patient verbalizes proper cleaning & demo proper donning of prostheses.  (Target Date: 06/27/2016)   Time 4   Period Weeks   Status New     PT SHORT TERM GOAL #3   Title Patient stands with LUE support on walker or crutch 5" & towards ground within 5" of floor with supervision.  (Target Date: 06/27/2016)   Time 4   Period Weeks   Status New     PT SHORT TERM GOAL #4   Title Patient ambulates 5450' with RW or crutches & prostheses with minA.  (Target Date: 06/27/2016)   Time 4   Period Weeks   Status New     PT SHORT TERM GOAL #5   Title Patient reports back pain increases </= 3 increments on 0-10 scale with above activities.  (Target Date: 06/27/2016)   Time 4   Period Weeks    Status New           PT Long Term Goals - 05/29/16 1929      PT LONG TERM GOAL #1   Title Patient demonstrates & verbalizes proper prosthetic care to enable safe use of prostheses.  (Target Date: 08/22/2016)   Time 12   Period Weeks   Status New     PT LONG TERM GOAL #2   Title Patient tolerates wear of prostheses >80% of awake hours without skin issues or limb pain to enable function during his day. (Target Date: 08/22/2016)   Time 12   Period Weeks   Status New     PT LONG TERM GOAL #3   Title Patient has standing balance tolerating standing 2 minutes without UE support turning head  to scan environment and with LUE support reaches 7" anteriorly & to floor modified independent. (Target Date: 08/22/2016)   Time 12   Period Weeks   Status New     PT LONG TERM GOAL #4   Title Patient ambulates 300' with prostheses & LRAD modified independent for community mobility. (Target Date: 08/22/2016)   Time 12   Period Weeks   Status New     PT LONG TERM GOAL #5   Title Patient negotiates ramps, curbs & stairs with LRAD & prostheses modified independent for community access. (Target Date: 08/22/2016)   Time 12   Period Weeks   Status New     Additional Long Term Goals   Additional Long Term Goals Yes     PT LONG TERM GOAL #6   Title Patient reports back pain increases </= 2 increments on 0-10 scale with above activities. (Target Date: 08/22/2016)   Time 12   Period Weeks   Status New            Plan - 06/23/16 1236    Clinical Impression Statement Today's skilled session continued to address gait and balance with bil preshorteneng prostheses with emphasis on decreased UE reliance for balance. Pt does fatigue with increased activity needing rest breaks. Pt is making steady progress toward goals and should benefit from continued PT to progress toward unmet goals.    Rehab Potential Good   PT Frequency 2x / week   PT Duration 12 weeks   PT Treatment/Interventions ADLs/Self Care Home  Management;Electrical Stimulation;Ultrasound;Moist Heat;DME Instruction;Gait training;Stair training;Functional mobility training;Therapeutic activities;Therapeutic exercise;Balance training;Neuromuscular re-education;Patient/family education;Prosthetic Training   PT Next Visit Plan gait and standing balance/endurance, initiate barriers, gait with crutches when they are available (prosthetist is working on making a set for pt)   Consulted and Agree with Plan of Care Patient      Patient will benefit from skilled therapeutic intervention in order to improve the following deficits and impairments:  Abnormal gait, Decreased activity tolerance, Decreased balance, Decreased range of motion, Decreased knowledge of use of DME, Decreased endurance, Decreased strength, Improper body mechanics, Postural dysfunction, Prosthetic Dependency, Pain  Visit Diagnosis: Unsteadiness on feet  Muscle weakness (generalized)  Other abnormalities of gait and mobility  History of falling  Abnormal posture  Stiffness of left hip, not elsewhere classified  Stiffness of right hip, not elsewhere classified  Chronic midline low back pain, with sciatica presence unspecified     Problem List Patient Active Problem List   Diagnosis Date Noted  . Seasonal allergies 02/08/2016  . Anemia due to other cause 02/08/2016  . Chronic pain syndrome 02/08/2016  . Depression 02/08/2016  . Post-operative pain   . Ischemia of lower extremity 01/23/2016  . Hypoalbuminemia due to protein-calorie malnutrition (HCC)   . Dysuria   . Tobacco abuse   . Constipation due to pain medication   . Chronic low back pain   . Benign essential HTN   . Asthma   . Hyponatremia   . CKD (chronic kidney disease)   . Anemia of chronic disease   . Transaminitis   . Phantom limb syndrome with pain (HCC)   . Status post bilateral above knee amputation (HCC)   . Acute kidney injury (HCC)   . Syncope   . Pressure ulcer 01/12/2016  . Fall    . Encounter for central line placement   . Urinary retention   . AKI (acute kidney injury) (HCC)   . Traumatic rhabdomyolysis (HCC)   .  Rhabdomyolysis 01/03/2016  . Nontraumatic compartment syndrome of leg 01/03/2016  . Chest pain 10/27/2012  . HTN (hypertension) 10/27/2012  . Back pain 10/27/2012    Jeffery Gross, PTA, [facility] [address] [place], [place] [address] [phone] 06/23/16, 1:52 PM   Name: KENYETTA FIFE MRN: [id number] Date of Birth: [birth year]

## 2016-06-26 ENCOUNTER — Ambulatory Visit: Payer: BLUE CROSS/BLUE SHIELD | Admitting: Physical Therapy

## 2016-06-26 ENCOUNTER — Encounter: Payer: Self-pay | Admitting: Physical Therapy

## 2016-06-26 DIAGNOSIS — R2681 Unsteadiness on feet: Secondary | ICD-10-CM | POA: Diagnosis not present

## 2016-06-26 DIAGNOSIS — M25652 Stiffness of left hip, not elsewhere classified: Secondary | ICD-10-CM

## 2016-06-26 DIAGNOSIS — G8929 Other chronic pain: Secondary | ICD-10-CM

## 2016-06-26 DIAGNOSIS — M25651 Stiffness of right hip, not elsewhere classified: Secondary | ICD-10-CM

## 2016-06-26 DIAGNOSIS — Z9181 History of falling: Secondary | ICD-10-CM

## 2016-06-26 DIAGNOSIS — M545 Low back pain: Secondary | ICD-10-CM

## 2016-06-26 DIAGNOSIS — R293 Abnormal posture: Secondary | ICD-10-CM

## 2016-06-26 DIAGNOSIS — M6281 Muscle weakness (generalized): Secondary | ICD-10-CM

## 2016-06-26 NOTE — Therapy (Signed)
[place] [address] [place], [place], [address] Phone: [phone]   Fax:  [phone]  Physical Therapy Treatment  Patient Details  Name: Jeffery Gross MRN: [id number] Date of Birth: Jan 21, 1961 Referring Provider: Meridee Score, MD  Encounter Date: 06/26/2016      PT End of Session - 06/26/16 1554    Visit Number 7   Number of Visits 25   Date for PT Re-Evaluation 08/22/16   Authorization Type BCBS   PT Start Time 1405   PT Stop Time 1448   PT Time Calculation (min) 43 min   Equipment Utilized During Treatment Gait belt   Activity Tolerance Patient limited by pain   Behavior During Therapy [facility] for tasks assessed/performed      Past Medical History:  Diagnosis Date  . Back pain   . Chest pain   . DDD (degenerative disc disease), cervical   . Depression   . HTN (hypertension)     Past Surgical History:  Procedure Laterality Date  . AMPUTATION Bilateral 01/09/2016   Procedure: Bilateral Above Knee Amputation, Apply Wound VAC;  Surgeon: Newt Minion, MD;  Location: [place];  Service: Orthopedics;  Laterality: Bilateral;  . FASCIOTOMY Bilateral 01/03/2016   Procedure: FASCIOTOMY;  Surgeon: Leandrew Koyanagi, MD;  Location: [place];  Service: Orthopedics;  Laterality: Bilateral;  . I&D EXTREMITY Bilateral 01/05/2016   Procedure: IRRIGATION AND DEBRIDEMENT BILATERAL LOWER EXTREMITIES; WOUND VAC CHANGE;  Surgeon: Leandrew Koyanagi, MD;  Location: [place];  Service: Orthopedics;  Laterality: Bilateral;  . I&D EXTREMITY Left 01/07/2016   Procedure: Irrigation and Debridement of Left Lower Extremity with Application of Wound Vac;  Surgeon: Leandrew Koyanagi, MD;  Location: [place];  Service: Orthopedics;  Laterality: Left;  Marland Kitchen VASECTOMY      There were no vitals filed for this visit.      Subjective Assessment - 06/26/16 1406    Subjective Pt did a lot of walking helping his girlfriend with industrial cleaning, picked up trash and  vacuumed (used W/C as needed). No falls but was a little shaky at first and residual limbs were sore and didn't walk much the next two days.   Patient is accompained by: Family member  girlfriend with pt today   Pertinent History DDD, chronic low back pain, HTN, 2 back surgeries   Limitations Lifting;Standing;Walking   Patient Stated Goals to use prostheses to get around in home & community. Go fishing   Currently in Pain? Yes   Pain Score 3    Pain Location Leg   Pain Orientation Right;Left   Pain Descriptors / Indicators Sore   Pain Type --  residual limb   Pain Onset More than a month ago   Pain Frequency Intermittent                         [facility] Adult PT Treatment/Exercise - 06/26/16 0001      Ambulation/Gait   Ambulation/Gait Yes   Ambulation/Gait Assistance 4: Min guard   Ambulation/Gait Assistance Details cues for step length   Ambulation Distance (Feet) 87 Feet  +60   Assistive device Rolling walker;Prostheses   Gait Pattern Step-through pattern;Decreased stride length;Right hip hike;Left hip hike;Right circumduction;Left circumduction;Trunk flexed   Ambulation Surface Level;Indoor     Prosthetics   Prosthetic Care Comments  skin care when sweating:  take prostheses off dab dry and then put right back on for skin to adjust.  Current prosthetic wear tolerance (days/week)  daily   Current prosthetic wear tolerance (#hours/day)  5-6 hours total with break during day.    Residual limb condition  bil intact without any issues per pt report, but contniues to itch   Education Provided Skin check;Residual limb care;Proper wear schedule/adjustment   Person(s) Educated Patient;Other (comment)   Education Method Explanation;Verbal cues   Education Method Verbalized understanding             Balance Exercises - 06/26/16 1609      Balance Exercises: Standing   Standing Eyes Opened Wide ([place])  reaching forward and down with each UE 5" Mod I.            PT Education - 06/26/16 1608    Education provided Yes   Education Details Primary PT observed and recommend following up with prosthetist for adjusting foot to toe in slight, secure screw on bottom of liner for greater control, and check on forearm crutches .    Person(s) Educated Patient;Other (comment)   Methods Explanation;Demonstration   Comprehension Verbalized understanding          PT Short Term Goals - 06/26/16 1555      PT SHORT TERM GOAL #1   Title Patient tolerates wear of prostheses >8hrs total /day without skin issues or limb pain. (Target Date: 06/27/2016)   Baseline Pt consistently wears prosthesis 5-6hrs/day, had good skin integrity but continues to have trouble with itchiness on skin; 06/26/16.   Time 4   Period Weeks   Status On-going     PT SHORT TERM GOAL #2   Title Patient verbalizes proper cleaning & demo proper donning of prostheses.  (Target Date: 06/27/2016)   Baseline Verbalizes proper cleaning and requires intermittent cues for technique of donning ply socks; 06/26/16.   Time 4   Period Weeks   Status Partially Met     PT SHORT TERM GOAL #3   Title Patient stands with LUE support on walker or crutch 5" & towards ground within 5" of floor with supervision.  (Target Date: 06/27/2016)   Baseline Met, 06/26/16.   Time 4   Period Weeks   Status Achieved     PT SHORT TERM GOAL #4   Title Patient ambulates 54' with RW or crutches & prostheses with minA.  (Target Date: 06/27/2016)   Baseline Met, 06/26/16.   Time 4   Period Weeks   Status Achieved     PT SHORT TERM GOAL #5   Title Patient reports back pain increases </= 3 increments on 0-10 scale with above activities.  (Target Date: 06/27/2016)   Baseline Met, 06/26/16.   Time 4   Period Weeks   Status Achieved           PT Long Term Goals - 05/29/16 1929      PT LONG TERM GOAL #1   Title Patient demonstrates & verbalizes proper prosthetic care to enable safe use of prostheses.  (Target Date:  08/22/2016)   Time 12   Period Weeks   Status New     PT LONG TERM GOAL #2   Title Patient tolerates wear of prostheses >80% of awake hours without skin issues or limb pain to enable function during his day. (Target Date: 08/22/2016)   Time 12   Period Weeks   Status New     PT LONG TERM GOAL #3   Title Patient has standing balance tolerating standing 2 minutes without UE support turning head to scan environment  and with LUE support reaches 7" anteriorly & to floor modified independent. (Target Date: 08/22/2016)   Time 12   Period Weeks   Status New     PT LONG TERM GOAL #4   Title Patient ambulates 300' with prostheses & LRAD modified independent for community mobility. (Target Date: 08/22/2016)   Time 12   Period Weeks   Status New     PT LONG TERM GOAL #5   Title Patient negotiates ramps, curbs & stairs with LRAD & prostheses modified independent for community access. (Target Date: 08/22/2016)   Time 12   Period Weeks   Status New     Additional Long Term Goals   Additional Long Term Goals Yes     PT LONG TERM GOAL #6   Title Patient reports back pain increases </= 2 increments on 0-10 scale with above activities. (Target Date: 08/22/2016)   Time 12   Period Weeks   Status New               Plan - 06/26/16 1601    Clinical Impression Statement Pt is making progress towards goals but continues to need to gain consistency with prostheses wear time and careful technique with ply sock adjustment.  Noted significant lateral rotation especially with right prosthesis. Primary PT observed and recommend following up with prosthetist for adjusting foot to toe in slight, secure screw on bottom of liner for greater control, and check on forearm crutches .                                                 Rehab Potential Good   PT Frequency 2x / week   PT Duration 12 weeks   PT Treatment/Interventions ADLs/Self Care Home Management;Electrical Stimulation;Ultrasound;Moist Heat;DME  Instruction;Gait training;Stair training;Functional mobility training;Therapeutic activities;Therapeutic exercise;Balance training;Neuromuscular re-education;Patient/family education;Prosthetic Training   PT Next Visit Plan gait and standing balance/endurance, initiate barriers, gait with crutches when they are available (prosthetist is working on making a set for pt)   Consulted and Agree with Plan of Care Patient      Patient will benefit from skilled therapeutic intervention in order to improve the following deficits and impairments:  Abnormal gait, Decreased activity tolerance, Decreased balance, Decreased range of motion, Decreased knowledge of use of DME, Decreased endurance, Decreased strength, Improper body mechanics, Postural dysfunction, Prosthetic Dependency, Pain  Visit Diagnosis: Abnormal posture  Unsteadiness on feet  History of falling  Muscle weakness (generalized)  Stiffness of left hip, not elsewhere classified  Stiffness of right hip, not elsewhere classified  Chronic midline low back pain, with sciatica presence unspecified     Problem List Patient Active Problem List   Diagnosis Date Noted  . Seasonal allergies 02/08/2016  . Anemia due to other cause 02/08/2016  . Chronic pain syndrome 02/08/2016  . Depression 02/08/2016  . Post-operative pain   . Ischemia of lower extremity 01/23/2016  . Hypoalbuminemia due to protein-calorie malnutrition ([place])   . Dysuria   . Tobacco abuse   . Constipation due to pain medication   . Chronic low back pain   . Benign essential HTN   . Asthma   . Hyponatremia   . CKD (chronic kidney disease)   . Anemia of chronic disease   . Transaminitis   . Phantom limb syndrome with pain ([place])   . Status post bilateral above  knee amputation ([place])   . Acute kidney injury ([place])   . Syncope   . Pressure ulcer 01/12/2016  . Fall   . Encounter for central line placement   . Urinary retention   . AKI (acute kidney injury) ([place])    . Traumatic rhabdomyolysis ([place])   . Rhabdomyolysis 01/03/2016  . Nontraumatic compartment syndrome of leg 01/03/2016  . Chest pain 10/27/2012  . HTN (hypertension) 10/27/2012  . Back pain 10/27/2012    Bjorn Loser, PTA  06/26/16, 4:11 PM [place] [address] [place], [place], [address] Phone: [phone]   Fax:  [phone]  Name: Jeffery Gross MRN: [id number] Date of Birth: 1961-01-31

## 2016-06-27 ENCOUNTER — Other Ambulatory Visit (INDEPENDENT_AMBULATORY_CARE_PROVIDER_SITE_OTHER): Payer: Self-pay | Admitting: Orthopedic Surgery

## 2016-06-27 ENCOUNTER — Other Ambulatory Visit (INDEPENDENT_AMBULATORY_CARE_PROVIDER_SITE_OTHER): Payer: Self-pay

## 2016-06-27 ENCOUNTER — Other Ambulatory Visit (INDEPENDENT_AMBULATORY_CARE_PROVIDER_SITE_OTHER): Payer: Self-pay | Admitting: Family

## 2016-06-27 MED ORDER — DULOXETINE HCL 30 MG PO CPEP: 30.0000 mg | ORAL_CAPSULE | Freq: Every day | ORAL | 2 refills | Status: DC

## 2016-06-27 MED ORDER — DULOXETINE HCL 30 MG PO CPEP: 30.0000 mg | ORAL_CAPSULE | Freq: Every day | ORAL | 0 refills | Status: DC

## 2016-06-27 NOTE — Telephone Encounter (Signed)
I sent this as a message for refill of the Cymbalta Dr. Lajoyce Cornersuda gave. Can you refill?

## 2016-06-27 NOTE — Telephone Encounter (Signed)
Pt calling for refill of Generic of Cymbalta. Pt number is [phone]

## 2016-07-01 ENCOUNTER — Ambulatory Visit: Payer: BLUE CROSS/BLUE SHIELD | Admitting: Physical Therapy

## 2016-07-01 ENCOUNTER — Encounter: Payer: Self-pay | Admitting: Physical Therapy

## 2016-07-01 DIAGNOSIS — R2681 Unsteadiness on feet: Secondary | ICD-10-CM

## 2016-07-01 DIAGNOSIS — G8929 Other chronic pain: Secondary | ICD-10-CM

## 2016-07-01 DIAGNOSIS — M6281 Muscle weakness (generalized): Secondary | ICD-10-CM

## 2016-07-01 DIAGNOSIS — M545 Low back pain: Secondary | ICD-10-CM

## 2016-07-01 DIAGNOSIS — R293 Abnormal posture: Secondary | ICD-10-CM

## 2016-07-02 NOTE — Therapy (Signed)
[facility] [address] [place], [place], [address] Phone: [phone]   Fax:  [phone]  Physical Therapy Treatment  Patient Details  Name: Jeffery Gross MRN: [id number] Date of Birth: 01/31/1961 Referring Provider: Meridee Score, MD  Encounter Date: 07/01/2016      PT End of Session - 07/01/16 1625    Visit Number 8   Number of Visits 25   Date for PT Re-Evaluation 08/22/16   Authorization Type BCBS   PT Start Time 1325   PT Stop Time 1403   PT Time Calculation (min) 38 min   Equipment Utilized During Treatment Gait belt   Activity Tolerance Patient tolerated treatment well   Behavior During Therapy WFL for tasks assessed/performed      Past Medical History:  Diagnosis Date  . Back pain   . Chest pain   . DDD (degenerative disc disease), cervical   . Depression   . HTN (hypertension)     Past Surgical History:  Procedure Laterality Date  . AMPUTATION Bilateral 01/09/2016   Procedure: Bilateral Above Knee Amputation, Apply Wound VAC;  Surgeon: Newt Minion, MD;  Location: [facility];  Service: Orthopedics;  Laterality: Bilateral;  . FASCIOTOMY Bilateral 01/03/2016   Procedure: FASCIOTOMY;  Surgeon: Leandrew Koyanagi, MD;  Location: [facility];  Service: Orthopedics;  Laterality: Bilateral;  . I&D EXTREMITY Bilateral 01/05/2016   Procedure: IRRIGATION AND DEBRIDEMENT BILATERAL LOWER EXTREMITIES; WOUND VAC CHANGE;  Surgeon: Leandrew Koyanagi, MD;  Location: [facility];  Service: Orthopedics;  Laterality: Bilateral;  . I&D EXTREMITY Left 01/07/2016   Procedure: Irrigation and Debridement of Left Lower Extremity with Application of Wound Vac;  Surgeon: Leandrew Koyanagi, MD;  Location: [facility];  Service: Orthopedics;  Laterality: Left;  Marland Kitchen VASECTOMY      There were no vitals filed for this visit.      Subjective Assessment - 07/01/16 1325    Subjective He has been wearing prostheses 4hrs 2x/day without issues. He arrived ambulating with  RW & prosheses with girlfriend supervising.    Patient is accompained by: Family member   Pertinent History DDD, chronic low back pain, HTN, 2 back surgeries   Limitations Lifting;Standing;Walking   Patient Stated Goals to use prostheses to get around in home & community. Go fishing   Currently in Pain? Yes   Pain Score 3    Pain Location Leg   Pain Orientation Left;Right   Pain Descriptors / Indicators Sore   Pain Type Other (Comment)  residual limb pain   Pain Onset More than a month ago   Pain Frequency Intermittent   Aggravating Factors  increasing standing with prostheses rapidly   Pain Relieving Factors rest     Prosthetic Training with Bilateral Transfemoral Amputation Foreshortened prostheses: Pt arrived ambulating with RW with supervision with verbal cues on posture & step length. PT instructed to increase wear to 6 hrs 2x/day. PT demo technique to negotiate stairs with 2 rails then progressed to single rail. Pt negotiated 4 steps with 2 rails with min guard and with single rail with minA.  PT instructed in 4 point gait pattern with crutches. PT adjusted axillary crutches youth version able to adjust ht but hand position created excessive elbow flexion.  Pt ambulated 115' with axillary crutches with minA and tactile, verbal cues on sequence, posture & step length. PT adjusted forearm crutches collapsing distal rod completely (will need new hole drilled to enable better stability between rods) and handle able  to adjust to proper ht. Pt ambulated 115' with forearm crutches with MinA and cues as above.                              PT Short Term Goals - 07/01/16 1627      PT SHORT TERM GOAL #1   Title Patient tolerates wear of prostheses >12hrs total /day without skin issues or limb pain. (Target Date: 07/25/2016)   Baseline Pt consistently wears prosthesis 5-6hrs/day, had good skin integrity but continues to have trouble with itchiness on skin; 06/26/16.    Time 4   Period Weeks   Status Revised     PT SHORT TERM GOAL #2   Title Patient verbalizes proper cleaning & demo proper donning of prostheses.  (Target Date: 06/27/2016)   Baseline Verbalizes proper cleaning and requires intermittent cues for technique of donning ply socks; 06/26/16.   Time 4   Period Weeks   Status Partially Met     PT SHORT TERM GOAL #3   Title Patient stands with LUE support on walker or crutch 5" & towards ground within 5" of floor with supervision.  (Target Date: 06/27/2016)   Baseline Met, 06/26/16.   Time 4   Period Weeks   Status Achieved     PT SHORT TERM GOAL #4   Title Patient ambulates 68' with RW or crutches & prostheses with minA.  (Target Date: 06/27/2016)   Baseline Met, 06/26/16.   Time 4   Period Weeks   Status Achieved     PT SHORT TERM GOAL #5   Title Patient reports back pain increases </= 3 increments on 0-10 scale with above activities.  (Target Date: 06/27/2016)   Baseline Met, 06/26/16.   Time 4   Period Weeks   Status Achieved     Additional Short Term Goals   Additional Short Term Goals Yes     PT SHORT TERM GOAL #6   Title Patient ambulates 200' with crutches & prostheses with supervision. (Target Date: 07/25/2016)   Time 4   Period Weeks   Status New     PT SHORT TERM GOAL #7   Title Patient negotiates ramps & curbs with crutches & prostheses with minA. (Target Date: 07/25/2016)   Time 4   Period Weeks   Status New     PT SHORT TERM GOAL #8   Title Patient reaches 10" anteriorly, manages pants & donnes jacket standing with intermittent UE support with supervision. (Target Date: 07/25/2016)   Time 4   Period Weeks   Status New           PT Long Term Goals - 07/01/16 1627      PT LONG TERM GOAL #1   Title Patient demonstrates & verbalizes proper prosthetic care to enable safe use of prostheses.  (Target Date: 08/22/2016)   Time 12   Period Weeks   Status On-going     PT LONG TERM GOAL #2   Title Patient tolerates wear of  prostheses >80% of awake hours without skin issues or limb pain to enable function during his day. (Target Date: 08/22/2016)   Time 12   Period Weeks   Status On-going     PT LONG TERM GOAL #3   Title Patient has standing balance tolerating standing 2 minutes without UE support turning head to scan environment and with LUE support reaches 7" anteriorly & to floor modified independent. (Target Date: 08/22/2016)  Time 12   Period Weeks   Status On-going     PT LONG TERM GOAL #4   Title Patient ambulates 300' with prostheses & LRAD modified independent for community mobility. (Target Date: 08/22/2016)   Time 12   Period Weeks   Status On-going     PT LONG TERM GOAL #5   Title Patient negotiates ramps, curbs & stairs with LRAD & prostheses modified independent for community access. (Target Date: 08/22/2016)   Time 12   Period Weeks   Status On-going     PT LONG TERM GOAL #6   Title Patient reports back pain increases </= 2 increments on 0-10 scale with above activities. (Target Date: 08/22/2016)   Time 12   Period Weeks   Status On-going               Plan - 07/01/16 1631    Clinical Impression Statement Patient was able to ambulate with crutches & prostheses with assist and skilled instruction. He negotiated stairs with use of stable rail with skilled instruction.    Rehab Potential Good   PT Frequency 2x / week   PT Duration 12 weeks   PT Treatment/Interventions ADLs/Self Care Home Management;Electrical Stimulation;Ultrasound;Moist Heat;DME Instruction;Gait training;Stair training;Functional mobility training;Therapeutic activities;Therapeutic exercise;Balance training;Neuromuscular re-education;Patient/family education;Prosthetic Training   PT Next Visit Plan gait and standing balance/endurance, initiate barriers, gait with crutches when they are available (prosthetist is working on making a set for pt)   Consulted and Agree with Plan of Care Patient      Patient will benefit  from skilled therapeutic intervention in order to improve the following deficits and impairments:  Abnormal gait, Decreased activity tolerance, Decreased balance, Decreased range of motion, Decreased knowledge of use of DME, Decreased endurance, Decreased strength, Improper body mechanics, Postural dysfunction, Prosthetic Dependency, Pain  Visit Diagnosis: Abnormal posture  Unsteadiness on feet  Muscle weakness (generalized)  Chronic midline low back pain, with sciatica presence unspecified     Problem List Patient Active Problem List   Diagnosis Date Noted  . Seasonal allergies 02/08/2016  . Anemia due to other cause 02/08/2016  . Chronic pain syndrome 02/08/2016  . Depression 02/08/2016  . Post-operative pain   . Ischemia of lower extremity 01/23/2016  . Hypoalbuminemia due to protein-calorie malnutrition ([facility])   . Dysuria   . Tobacco abuse   . Constipation due to pain medication   . Chronic low back pain   . Benign essential HTN   . Asthma   . Hyponatremia   . CKD (chronic kidney disease)   . Anemia of chronic disease   . Transaminitis   . Phantom limb syndrome with pain ([facility])   . Status post bilateral above knee amputation ([facility])   . Acute kidney injury ([facility])   . Syncope   . Pressure ulcer 01/12/2016  . Fall   . Encounter for central line placement   . Urinary retention   . AKI (acute kidney injury) ([facility])   . Traumatic rhabdomyolysis ([facility])   . Rhabdomyolysis 01/03/2016  . Nontraumatic compartment syndrome of leg 01/03/2016  . Chest pain 10/27/2012  . HTN (hypertension) 10/27/2012  . Back pain 10/27/2012    Omario Ander PT, DPT 07/02/2016, 6:34 AM  [facility] [address] [place], [place], [address] Phone: [phone]   Fax:  [phone]  Name: MIKAEL SKODA MRN: [id number] Date of Birth: 1961/02/11

## 2016-07-04 ENCOUNTER — Ambulatory Visit: Payer: BLUE CROSS/BLUE SHIELD | Admitting: Physical Therapy

## 2016-07-04 ENCOUNTER — Encounter: Payer: Self-pay | Admitting: Physical Therapy

## 2016-07-04 DIAGNOSIS — G8929 Other chronic pain: Secondary | ICD-10-CM

## 2016-07-04 DIAGNOSIS — R2681 Unsteadiness on feet: Secondary | ICD-10-CM | POA: Diagnosis not present

## 2016-07-04 DIAGNOSIS — M545 Low back pain: Secondary | ICD-10-CM

## 2016-07-04 DIAGNOSIS — R293 Abnormal posture: Secondary | ICD-10-CM

## 2016-07-04 DIAGNOSIS — R2689 Other abnormalities of gait and mobility: Secondary | ICD-10-CM

## 2016-07-04 DIAGNOSIS — M6281 Muscle weakness (generalized): Secondary | ICD-10-CM

## 2016-07-07 NOTE — Therapy (Signed)
[facility] [address] [place], [place], [address] Phone: [phone]   Fax:  [phone]  Physical Therapy Treatment  Patient Details  Name: Jeffery Gross MRN: [id number] Date of Birth: 11-10-1960 Referring Provider: Meridee Score, MD  Encounter Date: 07/04/2016     07/04/16 1458  PT Visits / Re-Eval  Visit Number 9  Number of Visits 25  Date for PT Re-Evaluation 08/22/16  Authorization  Authorization Type BCBS  PT Time Calculation  PT Start Time 8182  PT Stop Time 1530  PT Time Calculation (min) 45 min  PT - End of Session  Equipment Utilized During Treatment Gait belt  Activity Tolerance Patient tolerated treatment well  Behavior During Therapy [facility] for tasks assessed/performed    Past Medical History:  Diagnosis Date  . Back pain   . Chest pain   . DDD (degenerative disc disease), cervical   . Depression   . HTN (hypertension)     Past Surgical History:  Procedure Laterality Date  . AMPUTATION Bilateral 01/09/2016   Procedure: Bilateral Above Knee Amputation, Apply Wound VAC;  Surgeon: Newt Minion, MD;  Location: [facility];  Service: Orthopedics;  Laterality: Bilateral;  . FASCIOTOMY Bilateral 01/03/2016   Procedure: FASCIOTOMY;  Surgeon: Leandrew Koyanagi, MD;  Location: [facility];  Service: Orthopedics;  Laterality: Bilateral;  . I&D EXTREMITY Bilateral 01/05/2016   Procedure: IRRIGATION AND DEBRIDEMENT BILATERAL LOWER EXTREMITIES; WOUND VAC CHANGE;  Surgeon: Leandrew Koyanagi, MD;  Location: [facility];  Service: Orthopedics;  Laterality: Bilateral;  . I&D EXTREMITY Left 01/07/2016   Procedure: Irrigation and Debridement of Left Lower Extremity with Application of Wound Vac;  Surgeon: Leandrew Koyanagi, MD;  Location: [facility];  Service: Orthopedics;  Laterality: Left;  Marland Kitchen VASECTOMY      There were no vitals filed for this visit.   07/04/16 1452  Symptoms/Limitations  Subjective No falls. Reports his back/legs are hurting  "something aweful" today. Walked into therapy with standard walker today. Did not take pain medication this am before therapy.   Patient is accompained by: Family member  Pertinent History DDD, chronic low back pain, HTN, 2 back surgeries  Limitations Lifting;Standing;Walking  Patient Stated Goals to use prostheses to get around in home & community. Go fishing  Pain Assessment  Currently in Pain? Yes  Pain Score 7  Pain Location Generalized  Pain Orientation Right;Left;Lower  Pain Descriptors / Indicators Aching ("solid pain")  Pain Type Chronic pain  Pain Onset More than a month ago  Pain Frequency Constant  Aggravating Factors  progressing activity with prostheses too rapidly  Pain Relieving Factors rest, removing prostheses      07/04/16 1458  Transfers  Transfers Sit to Stand;Stand to Sit  Sit to Stand 4: Min guard;4: Min assist;With upper extremity assist;From chair/3-in-1;With armrests  Sit to Stand Details Verbal cues for technique;Verbal cues for sequencing;Verbal cues for precautions/safety;Verbal cues for safe use of DME/AE  Stand to Sit 4: Min guard;4: Min assist;With upper extremity assist;To chair/3-in-1;With armrests  Stand to Sit Details (indicate cue type and reason) Verbal cues for sequencing;Verbal cues for technique;Verbal cues for precautions/safety;Verbal cues for safe use of DME/AE  Ambulation/Gait  Ambulation/Gait Yes  Ambulation/Gait Assistance 4: Min assist  Ambulation/Gait Assistance Details cues on posture, step length, and sequencing with crutches.   Ambulation Distance (Feet) 40 Feet (x1, 50 x1, 32 x1)  Assistive device Lofstrands;Prostheses  Gait Pattern Step-through pattern;Decreased stride length;Right hip hike;Left hip hike;Right circumduction;Left circumduction;Trunk flexed  Ambulation Surface Level;Indoor  Neuro Re-ed   Neuro Re-ed Details  standing with standard walker in front and chair behind him: with dowel rod with 2# weight UE raises x 10  reps and upper trunk rotation x 10 reps each way. cues on posture and ex form, with up to min assist for balance.                          Prosthetics  Current prosthetic wear tolerance (days/week)  daily  Current prosthetic wear tolerance (#hours/day)  5-6 hours total with break during day.   Residual limb condition  bil intact without any issues per pt report, but contniues to itch at times (has improved)  Education Provided Proper wear schedule/adjustment;Proper weight-bearing schedule/adjustment;Residual limb care;Correct ply sock adjustment  Person(s) Educated Patient;Other (comment)  Education Method Explanation;Demonstration;Verbal cues  Education Method Verbalized understanding;Verbal cues required;Needs further instruction  Donning Prosthesis 5  Doffing Prosthesis 6          PT Short Term Goals - 07/01/16 1627      PT SHORT TERM GOAL #1   Title Patient tolerates wear of prostheses >12hrs total /day without skin issues or limb pain. (Target Date: 07/25/2016)   Baseline Pt consistently wears prosthesis 5-6hrs/day, had good skin integrity but continues to have trouble with itchiness on skin; 06/26/16.   Time 4   Period Weeks   Status Revised     PT SHORT TERM GOAL #2   Title Patient verbalizes proper cleaning & demo proper donning of prostheses.  (Target Date: 06/27/2016)   Baseline Verbalizes proper cleaning and requires intermittent cues for technique of donning ply socks; 06/26/16.   Time 4   Period Weeks   Status Partially Met     PT SHORT TERM GOAL #3   Title Patient stands with LUE support on walker or crutch 5" & towards ground within 5" of floor with supervision.  (Target Date: 06/27/2016)   Baseline Met, 06/26/16.   Time 4   Period Weeks   Status Achieved     PT SHORT TERM GOAL #4   Title Patient ambulates 35' with RW or crutches & prostheses with minA.  (Target Date: 06/27/2016)   Baseline Met, 06/26/16.   Time 4   Period Weeks   Status Achieved     PT SHORT TERM  GOAL #5   Title Patient reports back pain increases </= 3 increments on 0-10 scale with above activities.  (Target Date: 06/27/2016)   Baseline Met, 06/26/16.   Time 4   Period Weeks   Status Achieved     Additional Short Term Goals   Additional Short Term Goals Yes     PT SHORT TERM GOAL #6   Title Patient ambulates 200' with crutches & prostheses with supervision. (Target Date: 07/25/2016)   Time 4   Period Weeks   Status New     PT SHORT TERM GOAL #7   Title Patient negotiates ramps & curbs with crutches & prostheses with minA. (Target Date: 07/25/2016)   Time 4   Period Weeks   Status New     PT SHORT TERM GOAL #8   Title Patient reaches 10" anteriorly, manages pants & donnes jacket standing with intermittent UE support with supervision. (Target Date: 07/25/2016)   Time 4   Period Weeks   Status New           PT Long Term Goals - 07/01/16 1627  PT LONG TERM GOAL #1   Title Patient demonstrates & verbalizes proper prosthetic care to enable safe use of prostheses.  (Target Date: 08/22/2016)   Time 12   Period Weeks   Status On-going     PT LONG TERM GOAL #2   Title Patient tolerates wear of prostheses >80% of awake hours without skin issues or limb pain to enable function during his day. (Target Date: 08/22/2016)   Time 12   Period Weeks   Status On-going     PT LONG TERM GOAL #3   Title Patient has standing balance tolerating standing 2 minutes without UE support turning head to scan environment and with LUE support reaches 7" anteriorly & to floor modified independent. (Target Date: 08/22/2016)   Time 12   Period Weeks   Status On-going     PT LONG TERM GOAL #4   Title Patient ambulates 300' with prostheses & LRAD modified independent for community mobility. (Target Date: 08/22/2016)   Time 12   Period Weeks   Status On-going     PT LONG TERM GOAL #5   Title Patient negotiates ramps, curbs & stairs with LRAD & prostheses modified independent for community access.  (Target Date: 08/22/2016)   Time 12   Period Weeks   Status On-going     PT LONG TERM GOAL #6   Title Patient reports back pain increases </= 2 increments on 0-10 scale with above activities. (Target Date: 08/22/2016)   Time 12   Period Weeks   Status On-going        07/04/16 1458  Plan  Clinical Impression Statement Today's skilled session continued to work on gait with crutches and standing balance with decreased UE relinace for balance. Pt limited today by LE and back pain, needing rest breaks to allow pain levels to decrease. Pt is making steady progress toward goals and should benefit from continued PT to progress toward goals.   Pt will benefit from skilled therapeutic intervention in order to improve on the following deficits Abnormal gait;Decreased activity tolerance;Decreased balance;Decreased range of motion;Decreased knowledge of use of DME;Decreased endurance;Decreased strength;Improper body mechanics;Postural dysfunction;Prosthetic Dependency;Pain  Rehab Potential Good  PT Frequency 2x / week  PT Duration 12 weeks  PT Treatment/Interventions ADLs/Self Care Home Management;Electrical Stimulation;Ultrasound;Moist Heat;DME Instruction;Gait training;Stair training;Functional mobility training;Therapeutic activities;Therapeutic exercise;Balance training;Neuromuscular re-education;Patient/family education;Prosthetic Training  PT Next Visit Plan gait and standing balance/endurance, initiate barriers, gait with crutches when they are available (prosthetist is working on making a set for pt)  Consulted and Agree with Plan of Care Patient    Patient will benefit from skilled therapeutic intervention in order to improve the following deficits and impairments:  Abnormal gait, Decreased activity tolerance, Decreased balance, Decreased range of motion, Decreased knowledge of use of DME, Decreased endurance, Decreased strength, Improper body mechanics, Postural dysfunction, Prosthetic Dependency,  Pain  Visit Diagnosis: Abnormal posture  Unsteadiness on feet  Muscle weakness (generalized)  Other abnormalities of gait and mobility  Chronic midline low back pain, with sciatica presence unspecified     Problem List Patient Active Problem List   Diagnosis Date Noted  . Seasonal allergies 02/08/2016  . Anemia due to other cause 02/08/2016  . Chronic pain syndrome 02/08/2016  . Depression 02/08/2016  . Post-operative pain   . Ischemia of lower extremity 01/23/2016  . Hypoalbuminemia due to protein-calorie malnutrition ([facility])   . Dysuria   . Tobacco abuse   . Constipation due to pain medication   . Chronic low back pain   .  Benign essential HTN   . Asthma   . Hyponatremia   . CKD (chronic kidney disease)   . Anemia of chronic disease   . Transaminitis   . Phantom limb syndrome with pain ([facility])   . Status post bilateral above knee amputation ([facility])   . Acute kidney injury ([facility])   . Syncope   . Pressure ulcer 01/12/2016  . Fall   . Encounter for central line placement   . Urinary retention   . AKI (acute kidney injury) ([facility])   . Traumatic rhabdomyolysis ([facility])   . Rhabdomyolysis 01/03/2016  . Nontraumatic compartment syndrome of leg 01/03/2016  . Chest pain 10/27/2012  . HTN (hypertension) 10/27/2012  . Back pain 10/27/2012    Willow Ora, PTA, [facility] [address], [place], [place] [address] [phone] 07/07/16, 9:14 AM   Name: Jeffery Gross MRN: [id number] Date of Birth: 1961-06-19

## 2016-07-08 ENCOUNTER — Encounter: Payer: Self-pay | Admitting: Physical Therapy

## 2016-07-08 ENCOUNTER — Ambulatory Visit: Payer: BLUE CROSS/BLUE SHIELD | Admitting: Physical Therapy

## 2016-07-08 DIAGNOSIS — R2681 Unsteadiness on feet: Secondary | ICD-10-CM | POA: Diagnosis not present

## 2016-07-08 DIAGNOSIS — R2689 Other abnormalities of gait and mobility: Secondary | ICD-10-CM

## 2016-07-08 DIAGNOSIS — R293 Abnormal posture: Secondary | ICD-10-CM

## 2016-07-08 DIAGNOSIS — M6281 Muscle weakness (generalized): Secondary | ICD-10-CM

## 2016-07-09 NOTE — Therapy (Signed)
[facility] [address] [place], [place], [address] Phone: [phone]   Fax:  [phone]  Physical Therapy Treatment  Patient Details  Name: Jeffery Gross MRN: [id number] Date of Birth: 1961/01/13 Referring Provider: Aldean Baker, MD  Encounter Date: 07/08/2016      PT End of Session - 07/08/16 1400    Visit Number 10   Number of Visits 25   Date for PT Re-Evaluation 08/22/16   Authorization Type BCBS   PT Start Time 1313   PT Stop Time 1400   PT Time Calculation (min) 47 min   Equipment Utilized During Treatment Gait belt   Activity Tolerance Patient tolerated treatment well   Behavior During Therapy [facility] for tasks assessed/performed      Past Medical History:  Diagnosis Date  . Back pain   . Chest pain   . DDD (degenerative disc disease), cervical   . Depression   . HTN (hypertension)     Past Surgical History:  Procedure Laterality Date  . AMPUTATION Bilateral 01/09/2016   Procedure: Bilateral Above Knee Amputation, Apply Wound VAC;  Surgeon: Nadara Mustard, MD;  Location: [facility] OR;  Service: Orthopedics;  Laterality: Bilateral;  . FASCIOTOMY Bilateral 01/03/2016   Procedure: FASCIOTOMY;  Surgeon: Tarry Kos, MD;  Location: [facility] OR;  Service: Orthopedics;  Laterality: Bilateral;  . I&D EXTREMITY Bilateral 01/05/2016   Procedure: IRRIGATION AND DEBRIDEMENT BILATERAL LOWER EXTREMITIES; WOUND VAC CHANGE;  Surgeon: Tarry Kos, MD;  Location: [facility] OR;  Service: Orthopedics;  Laterality: Bilateral;  . I&D EXTREMITY Left 01/07/2016   Procedure: Irrigation and Debridement of Left Lower Extremity with Application of Wound Vac;  Surgeon: Tarry Kos, MD;  Location: [facility] OR;  Service: Orthopedics;  Laterality: Left;  Marland Kitchen VASECTOMY      There were no vitals filed for this visit.      Subjective Assessment - 07/08/16 1313    Subjective He fell getting off couch but no issues or injuries. His crutches are not correct ht  so he is drilling holes to set at proper ht.    Patient is accompained by: Family member   Pertinent History DDD, chronic low back pain, HTN, 2 back surgeries   Limitations Lifting;Standing;Walking   Patient Stated Goals to use prostheses to get around in home & community. Go fishing   Currently in Pain? Yes   Pain Score 4    Pain Location Back   Pain Orientation Lower;Mid   Pain Descriptors / Indicators Aching   Pain Type Chronic pain   Pain Onset More than a month ago   Pain Frequency Constant   Aggravating Factors  progressing activity too rapidly   Pain Relieving Factors rest, removing prostheses   Multiple Pain Sites No                         [facility] Adult PT Treatment/Exercise - 07/08/16 1315      Transfers   Transfers Sit to Stand;Stand to Sit   Sit to Stand 4: Min guard;With upper extremity assist;From chair/3-in-1;With armrests  to forearm crutches   Sit to Stand Details Verbal cues for technique;Verbal cues for sequencing;Verbal cues for precautions/safety;Verbal cues for safe use of DME/AE   Stand to Sit 4: Min guard;With upper extremity assist;To chair/3-in-1;With armrests  from forearm crutches   Stand to Sit Details (indicate cue type and reason) Verbal cues for sequencing;Verbal cues for technique;Verbal cues for precautions/safety;Verbal cues for  safe use of DME/AE     Ambulation/Gait   Ambulation/Gait Yes   Ambulation/Gait Assistance 4: Min assist   Ambulation/Gait Assistance Details verbal & tactile cues on sequence 4-pt and upright posture / balance reactions.    Ambulation Distance (Feet) 160 Feet  160' X 1 & 115' X 2   Assistive device Lofstrands;Prostheses   Gait Pattern Step-through pattern;Decreased stride length;Right hip hike;Left hip hike;Right circumduction;Left circumduction;Trunk flexed   Ambulation Surface Indoor;Level   Ramp 3: Mod assist  forearm crutches & Bil. foreshortened prostheses, 2nd person   Ramp Details (indicate cue  type and reason) demo & verbal cues on technique forearm crutches & Bil. foreshortened prostheses including upright posture, shorter steps and 4-pt sequence with wt shift   Curb 3: Mod assist  forearm crutches & Bil. foreshortened prostheses, 2nd person   Curb Details (indicate cue type and reason) demo & verbal cues on technique forearm crutches & Bil. foreshortened prostheses including upright posture, shorter steps and 4-pt sequence with wt shift     Neuro Re-ed    Neuro Re-ed Details  standing balance with mat table (at home couch or counter) and chair in front. Static stance 30sec. Turning head to look to sides & overhead with intermittent UE support needed. Single UE on chair leg for less support than chair bottom & reaching to floor with other hand then returning to upright without UE support.      Prosthetics   Current prosthetic wear tolerance (days/week)  daily   Current prosthetic wear tolerance (#hours/day)  reports wearing 3-5 hrs 2x/day.   PT instructed to increase liners to 6 hrs 2x/day   Residual limb condition  bil intact without any issues per pt report, but contniues to itch at times (has improved)   Education Provided Proper wear schedule/adjustment;Proper weight-bearing schedule/adjustment;Residual limb care;Correct ply sock adjustment   Person(s) Educated Patient;Other (comment)  girlfriend   Education Method Explanation;Verbal cues   Education Method Verbalized understanding;Verbal cues required;Needs further instruction                  PT Short Term Goals - 07/08/16 1400      PT SHORT TERM GOAL #1   Title Patient tolerates wear of prostheses >12hrs total /day without skin issues or limb pain. (Target Date: 07/25/2016)   Baseline Pt consistently wears prosthesis 5-6hrs/day, had good skin integrity but continues to have trouble with itchiness on skin; 06/26/16.   Time 4   Period Weeks   Status On-going     PT SHORT TERM GOAL #2   Title Patient verbalizes  proper cleaning & demo proper donning of prostheses.  (Target Date: 06/27/2016)   Baseline Verbalizes proper cleaning and requires intermittent cues for technique of donning ply socks; 06/26/16.   Time 4   Period Weeks   Status Partially Met     PT SHORT TERM GOAL #3   Title Patient stands with LUE support on walker or crutch 5" & towards ground within 5" of floor with supervision.  (Target Date: 06/27/2016)   Baseline Met, 06/26/16.   Time 4   Period Weeks   Status Achieved     PT SHORT TERM GOAL #4   Title Patient ambulates 57' with RW or crutches & prostheses with minA.  (Target Date: 06/27/2016)   Baseline Met, 06/26/16.   Time 4   Period Weeks   Status Achieved     PT SHORT TERM GOAL #5   Title Patient reports back pain increases </=  3 increments on 0-10 scale with above activities.  (Target Date: 06/27/2016)   Baseline Met, 06/26/16.   Time 4   Period Weeks   Status Achieved     PT SHORT TERM GOAL #6   Title Patient ambulates 200' with crutches & prostheses with supervision. (Target Date: 07/25/2016)   Time 4   Period Weeks   Status On-going     PT SHORT TERM GOAL #7   Title Patient negotiates ramps & curbs with crutches & prostheses with minA. (Target Date: 07/25/2016)   Time 4   Period Weeks   Status On-going     PT SHORT TERM GOAL #8   Title Patient reaches 10" anteriorly, manages pants & donnes jacket standing with intermittent UE support with supervision. (Target Date: 07/25/2016)   Time 4   Period Weeks   Status On-going           PT Long Term Goals - 07/01/16 1627      PT LONG TERM GOAL #1   Title Patient demonstrates & verbalizes proper prosthetic care to enable safe use of prostheses.  (Target Date: 08/22/2016)   Time 12   Period Weeks   Status On-going     PT LONG TERM GOAL #2   Title Patient tolerates wear of prostheses >80% of awake hours without skin issues or limb pain to enable function during his day. (Target Date: 08/22/2016)   Time 12   Period Weeks    Status On-going     PT LONG TERM GOAL #3   Title Patient has standing balance tolerating standing 2 minutes without UE support turning head to scan environment and with LUE support reaches 7" anteriorly & to floor modified independent. (Target Date: 08/22/2016)   Time 12   Period Weeks   Status On-going     PT LONG TERM GOAL #4   Title Patient ambulates 300' with prostheses & LRAD modified independent for community mobility. (Target Date: 08/22/2016)   Time 12   Period Weeks   Status On-going     PT LONG TERM GOAL #5   Title Patient negotiates ramps, curbs & stairs with LRAD & prostheses modified independent for community access. (Target Date: 08/22/2016)   Time 12   Period Weeks   Status On-going     PT LONG TERM GOAL #6   Title Patient reports back pain increases </= 2 increments on 0-10 scale with above activities. (Target Date: 08/22/2016)   Time 12   Period Weeks   Status On-going               Plan - 07/08/16 1400    Clinical Impression Statement Patient did well negotiating ramp & curb with forearm crutches & bil. foreshortened prostheses for first time. We performed 2 reps and he required less assist on 2nd attempt. Patient is progressing towards STGs and should meet them by 07/25/2016 as planned.    Rehab Potential Good   PT Frequency 2x / week   PT Duration 12 weeks   PT Treatment/Interventions ADLs/Self Care Home Management;Electrical Stimulation;Ultrasound;Moist Heat;DME Instruction;Gait training;Stair training;Functional mobility training;Therapeutic activities;Therapeutic exercise;Balance training;Neuromuscular re-education;Patient/family education;Prosthetic Training   PT Next Visit Plan gait and standing balance/endurance, gait with crutches including barriers   Consulted and Agree with Plan of Care Patient      Patient will benefit from skilled therapeutic intervention in order to improve the following deficits and impairments:  Abnormal gait, Decreased  activity tolerance, Decreased balance, Decreased range of motion, Decreased knowledge of use  of DME, Decreased endurance, Decreased strength, Improper body mechanics, Postural dysfunction, Prosthetic Dependency, Pain  Visit Diagnosis: Abnormal posture  Unsteadiness on feet  Muscle weakness (generalized)  Other abnormalities of gait and mobility     Problem List Patient Active Problem List   Diagnosis Date Noted  . Seasonal allergies 02/08/2016  . Anemia due to other cause 02/08/2016  . Chronic pain syndrome 02/08/2016  . Depression 02/08/2016  . Post-operative pain   . Ischemia of lower extremity 01/23/2016  . Hypoalbuminemia due to protein-calorie malnutrition ([place])   . Dysuria   . Tobacco abuse   . Constipation due to pain medication   . Chronic low back pain   . Benign essential HTN   . Asthma   . Hyponatremia   . CKD (chronic kidney disease)   . Anemia of chronic disease   . Transaminitis   . Phantom limb syndrome with pain ([place])   . Status post bilateral above knee amputation ([place])   . Acute kidney injury ([place])   . Syncope   . Pressure ulcer 01/12/2016  . Fall   . Encounter for central line placement   . Urinary retention   . AKI (acute kidney injury) ([place])   . Traumatic rhabdomyolysis ([place])   . Rhabdomyolysis 01/03/2016  . Nontraumatic compartment syndrome of leg 01/03/2016  . Chest pain 10/27/2012  . HTN (hypertension) 10/27/2012  . Back pain 10/27/2012    Jeffery Gross PT, DPT 07/09/2016, 10:11 AM  [facility] [address] [place], [place], [address] Phone: [phone]   Fax:  [phone]  Name: Jeffery Gross MRN: [id number] Date of Birth: [birth year]

## 2016-07-10 ENCOUNTER — Ambulatory Visit: Payer: BLUE CROSS/BLUE SHIELD | Admitting: Physical Therapy

## 2016-07-10 ENCOUNTER — Encounter: Payer: Self-pay | Admitting: Physical Therapy

## 2016-07-10 DIAGNOSIS — R2681 Unsteadiness on feet: Secondary | ICD-10-CM

## 2016-07-10 DIAGNOSIS — R2689 Other abnormalities of gait and mobility: Secondary | ICD-10-CM

## 2016-07-10 DIAGNOSIS — R293 Abnormal posture: Secondary | ICD-10-CM

## 2016-07-10 DIAGNOSIS — M6281 Muscle weakness (generalized): Secondary | ICD-10-CM

## 2016-07-11 NOTE — Therapy (Signed)
[facility] [address] [place], [place], [address] Phone: [phone]   Fax:  [phone]  Physical Therapy Treatment  Patient Details  Name: Jeffery Gross MRN: [id number] Date of Birth: 10/15/1960 Referring Provider: Meridee Score, MD  Encounter Date: 07/10/2016      PT End of Session - 07/10/16 1800    Visit Number 11   Number of Visits 25   Date for PT Re-Evaluation 08/22/16   Authorization Type BCBS   PT Start Time 1315   PT Stop Time 1359   PT Time Calculation (min) 44 min   Equipment Utilized During Treatment Gait belt   Activity Tolerance Patient tolerated treatment well   Behavior During Therapy [facility] for tasks assessed/performed      Past Medical History:  Diagnosis Date  . Back pain   . Chest pain   . DDD (degenerative disc disease), cervical   . Depression   . HTN (hypertension)     Past Surgical History:  Procedure Laterality Date  . AMPUTATION Bilateral 01/09/2016   Procedure: Bilateral Above Knee Amputation, Apply Wound VAC;  Surgeon: Newt Minion, MD;  Location: [place];  Service: Orthopedics;  Laterality: Bilateral;  . FASCIOTOMY Bilateral 01/03/2016   Procedure: FASCIOTOMY;  Surgeon: Leandrew Koyanagi, MD;  Location: [place];  Service: Orthopedics;  Laterality: Bilateral;  . I&D EXTREMITY Bilateral 01/05/2016   Procedure: IRRIGATION AND DEBRIDEMENT BILATERAL LOWER EXTREMITIES; WOUND VAC CHANGE;  Surgeon: Leandrew Koyanagi, MD;  Location: [place];  Service: Orthopedics;  Laterality: Bilateral;  . I&D EXTREMITY Left 01/07/2016   Procedure: Irrigation and Debridement of Left Lower Extremity with Application of Wound Vac;  Surgeon: Leandrew Koyanagi, MD;  Location: [place];  Service: Orthopedics;  Laterality: Left;  Marland Kitchen VASECTOMY      There were no vitals filed for this visit.      Subjective Assessment - 07/10/16 1317    Subjective He sat on bottom trying to transfer into the car because w/c moved.    Patient is  accompained by: Family member   Pertinent History DDD, chronic low back pain, HTN, 2 back surgeries   Limitations Lifting;Standing;Walking   Patient Stated Goals to use prostheses to get around in home & community. Go fishing   Currently in Pain? Yes   Pain Score 6    Pain Location Back   Pain Orientation Mid;Lower   Pain Descriptors / Indicators Aching   Pain Type Chronic pain   Pain Onset More than a month ago   Pain Frequency Constant   Aggravating Factors  not moving, stiffness   Pain Relieving Factors meds, rest                         [facility] Adult PT Treatment/Exercise - 07/10/16 1315      Transfers   Transfers Sit to Stand;Stand to [employer] Transfers   Sit to Stand 5: Supervision;With upper extremity assist;With armrests;From chair/3-in-1  from chairs without armrests & with armrests   Sit to Stand Details Verbal cues for technique;Verbal cues for sequencing;Verbal cues for precautions/safety;Verbal cues for safe use of DME/AE   Sit to Stand Details (indicate cue type and reason) cues on technique without armrests turning to corner; worked on not touching to stabilize from chairs with armrests   Stand to Sit 5: Supervision;With upper extremity assist;With armrests;To chair/3-in-1  chairs with & without armrests   Stand to Sit Details (indicate cue type  and reason) Verbal cues for sequencing;Verbal cues for technique;Verbal cues for precautions/safety;Verbal cues for safe use of DME/AE   Stand to Sit Details cues on technique with chairs without armrests   Stand Pivot Transfers 5: Supervision;With armrests  UE support on counter & chairback   Stand Pivot Transfer Details (indicate cue type and reason) verbal & tactile cues on turning from pelvis and bringing feet back under center of mass     Ambulation/Gait   Ambulation/Gait Yes   Ambulation/Gait Assistance 4: Min assist;5: Supervision  SBA tile surface & MinA on paved / uneven   Ambulation/Gait  Assistance Details verbal cues on technique with outdoor surfaces   Ambulation Distance (Feet) 160 Feet  160' & 75' indoors & 150' outdoors   Assistive device Lofstrands;Prostheses   Gait Pattern Step-through pattern;Decreased stride length;Right hip hike;Left hip hike;Right circumduction;Left circumduction;Trunk flexed   Ambulation Surface Indoor;Level;Outdoor;Paved;Gravel;Grass;Other (comment)  mulch   Stairs Yes   Stairs Assistance 4: Min guard   Stairs Assistance Details (indicate cue type and reason) demo & verbal cues on technique with 1 crutch & 1 rail   Stair Management Technique One rail Right;With crutches;Step to pattern;Forwards  one rail & one crutch   Number of Stairs 4   Ramp 4: Min assist  forearm crutches & Bil. foreshortened prostheses, 2nd person   Ramp Details (indicate cue type and reason) outdoors, cues on technique of upright posture & shortening step length   Curb 4: Min assist  forearm crutches & Bil. foreshortened prostheses, 2nd person   Curb Details (indicate cue type and reason) outdoors, verbal cues on proper technique / sequence     Neuro Re-ed    Neuro Re-ed Details  standing balance at counter for safety: sit to/from stand chairs with armrests trying to not touch counter to stabilize, sit to/from stand from chairs without armrests touching counter to stabilize, side stepping & reaching same direction even, upward & downward and using counter & chairback turning clockwise & counterclockwise 90* movement each turn for full 360*     Prosthetics   Current prosthetic wear tolerance (days/week)  daily   Current prosthetic wear tolerance (#hours/day)  reports wearing 6 hrs 2x/day. Removes prostheses in kitchen & car    Residual limb condition  bil intact without any issues per pt report, but contniues to itch at times (has improved)   Education Provided Proper wear schedule/adjustment;Proper weight-bearing schedule/adjustment;Residual limb care;Correct ply sock  adjustment   Person(s) Educated Patient;Caregiver(s)   Education Method Explanation;Tactile cues;Verbal cues   Education Method Verbalized understanding;Needs further instruction                PT Education - 07/10/16 1315    Education provided Yes   Education Details standing balance at counter for safety: sit to/from stand chairs with armrests trying to not touch counter to stabilize, sit to/from stand from chairs without armrests touching counter to stabilize, side stepping & reaching same direction even, upward & downward and using counter & chairback turning clockwise & counterclockwise 90* movement each turn for full 360*   Person(s) Educated Patient;Caregiver(s)   Methods Explanation;Demonstration;Tactile cues;Verbal cues   Comprehension Verbalized understanding;Returned demonstration;Verbal cues required;Tactile cues required;Need further instruction          PT Short Term Goals - 07/08/16 1400      PT SHORT TERM GOAL #1   Title Patient tolerates wear of prostheses >12hrs total /day without skin issues or limb pain. (Target Date: 07/25/2016)   Baseline Pt consistently wears  prosthesis 5-6hrs/day, had good skin integrity but continues to have trouble with itchiness on skin; 06/26/16.   Time 4   Period Weeks   Status On-going     PT SHORT TERM GOAL #2   Title Patient verbalizes proper cleaning & demo proper donning of prostheses.  (Target Date: 06/27/2016)   Baseline Verbalizes proper cleaning and requires intermittent cues for technique of donning ply socks; 06/26/16.   Time 4   Period Weeks   Status Partially Met     PT SHORT TERM GOAL #3   Title Patient stands with LUE support on walker or crutch 5" & towards ground within 5" of floor with supervision.  (Target Date: 06/27/2016)   Baseline Met, 06/26/16.   Time 4   Period Weeks   Status Achieved     PT SHORT TERM GOAL #4   Title Patient ambulates 42' with RW or crutches & prostheses with minA.  (Target Date:  06/27/2016)   Baseline Met, 06/26/16.   Time 4   Period Weeks   Status Achieved     PT SHORT TERM GOAL #5   Title Patient reports back pain increases </= 3 increments on 0-10 scale with above activities.  (Target Date: 06/27/2016)   Baseline Met, 06/26/16.   Time 4   Period Weeks   Status Achieved     PT SHORT TERM GOAL #6   Title Patient ambulates 200' with crutches & prostheses with supervision. (Target Date: 07/25/2016)   Time 4   Period Weeks   Status On-going     PT SHORT TERM GOAL #7   Title Patient negotiates ramps & curbs with crutches & prostheses with minA. (Target Date: 07/25/2016)   Time 4   Period Weeks   Status On-going     PT SHORT TERM GOAL #8   Title Patient reaches 10" anteriorly, manages pants & donnes jacket standing with intermittent UE support with supervision. (Target Date: 07/25/2016)   Time 4   Period Weeks   Status On-going           PT Long Term Goals - 07/01/16 1627      PT LONG TERM GOAL #1   Title Patient demonstrates & verbalizes proper prosthetic care to enable safe use of prostheses.  (Target Date: 08/22/2016)   Time 12   Period Weeks   Status On-going     PT LONG TERM GOAL #2   Title Patient tolerates wear of prostheses >80% of awake hours without skin issues or limb pain to enable function during his day. (Target Date: 08/22/2016)   Time 12   Period Weeks   Status On-going     PT LONG TERM GOAL #3   Title Patient has standing balance tolerating standing 2 minutes without UE support turning head to scan environment and with LUE support reaches 7" anteriorly & to floor modified independent. (Target Date: 08/22/2016)   Time 12   Period Weeks   Status On-going     PT LONG TERM GOAL #4   Title Patient ambulates 300' with prostheses & LRAD modified independent for community mobility. (Target Date: 08/22/2016)   Time 12   Period Weeks   Status On-going     PT LONG TERM GOAL #5   Title Patient negotiates ramps, curbs & stairs with LRAD &  prostheses modified independent for community access. (Target Date: 08/22/2016)   Time 12   Period Weeks   Status On-going     PT LONG TERM GOAL #6  Title Patient reports back pain increases </= 2 increments on 0-10 scale with above activities. (Target Date: 08/22/2016)   Time 12   Period Weeks   Status On-going               Plan - 07/10/16 1800    Clinical Impression Statement Patient appears to understand updated standing balance activities near counter for safety. He improved sit to/from stand from chairs without armrests with skilled instruction. He did well for first time on grass, gravel & mulch.    Rehab Potential Good   PT Frequency 2x / week   PT Duration 12 weeks   PT Treatment/Interventions ADLs/Self Care Home Management;Electrical Stimulation;Ultrasound;Moist Heat;DME Instruction;Gait training;Stair training;Functional mobility training;Therapeutic activities;Therapeutic exercise;Balance training;Neuromuscular re-education;Patient/family education;Prosthetic Training   PT Next Visit Plan gait and standing balance/endurance, gait with crutches including barriers   Consulted and Agree with Plan of Care Patient      Patient will benefit from skilled therapeutic intervention in order to improve the following deficits and impairments:  Abnormal gait, Decreased activity tolerance, Decreased balance, Decreased range of motion, Decreased knowledge of use of DME, Decreased endurance, Decreased strength, Improper body mechanics, Postural dysfunction, Prosthetic Dependency, Pain  Visit Diagnosis: Abnormal posture  Unsteadiness on feet  Muscle weakness (generalized)  Other abnormalities of gait and mobility     Problem List Patient Active Problem List   Diagnosis Date Noted  . Seasonal allergies 02/08/2016  . Anemia due to other cause 02/08/2016  . Chronic pain syndrome 02/08/2016  . Depression 02/08/2016  . Post-operative pain   . Ischemia of lower extremity  01/23/2016  . Hypoalbuminemia due to protein-calorie malnutrition ([place])   . Dysuria   . Tobacco abuse   . Constipation due to pain medication   . Chronic low back pain   . Benign essential HTN   . Asthma   . Hyponatremia   . CKD (chronic kidney disease)   . Anemia of chronic disease   . Transaminitis   . Phantom limb syndrome with pain ([place])   . Status post bilateral above knee amputation ([place])   . Acute kidney injury ([place])   . Syncope   . Pressure ulcer 01/12/2016  . Fall   . Encounter for central line placement   . Urinary retention   . AKI (acute kidney injury) ([place])   . Traumatic rhabdomyolysis ([place])   . Rhabdomyolysis 01/03/2016  . Nontraumatic compartment syndrome of leg 01/03/2016  . Chest pain 10/27/2012  . HTN (hypertension) 10/27/2012  . Back pain 10/27/2012    Baylor Cortez PT, DPT 07/11/2016, 11:27 AM  [facility] [address] [place], [place], [address] Phone: [phone]   Fax:  [phone]  Name: BARRIE WALE MRN: [id number] Date of Birth: 16-May-1961

## 2016-07-15 ENCOUNTER — Encounter: Payer: BLUE CROSS/BLUE SHIELD | Admitting: Physical Therapy

## 2016-07-16 ENCOUNTER — Ambulatory Visit: Payer: BLUE CROSS/BLUE SHIELD | Admitting: Physical Therapy

## 2016-07-17 ENCOUNTER — Encounter: Payer: Self-pay | Admitting: Physical Therapy

## 2016-07-17 ENCOUNTER — Ambulatory Visit: Payer: BLUE CROSS/BLUE SHIELD | Admitting: Physical Therapy

## 2016-07-17 DIAGNOSIS — R2681 Unsteadiness on feet: Secondary | ICD-10-CM

## 2016-07-17 DIAGNOSIS — M545 Low back pain: Secondary | ICD-10-CM

## 2016-07-17 DIAGNOSIS — R2689 Other abnormalities of gait and mobility: Secondary | ICD-10-CM

## 2016-07-17 DIAGNOSIS — M6281 Muscle weakness (generalized): Secondary | ICD-10-CM

## 2016-07-17 DIAGNOSIS — R293 Abnormal posture: Secondary | ICD-10-CM

## 2016-07-17 DIAGNOSIS — G8929 Other chronic pain: Secondary | ICD-10-CM

## 2016-07-17 NOTE — Therapy (Signed)
[facility] [address] [place], [place], [address] Phone: [phone]   Fax:  [phone]  Physical Therapy Treatment  Patient Details  Name: Jeffery Gross MRN: [id number] Date of Birth: Sep 09, 1960 Referring Provider: Meridee Score, MD  Encounter Date: 07/17/2016      PT End of Session - 07/17/16 1440    Visit Number 12   Number of Visits 25   Date for PT Re-Evaluation 08/22/16   Authorization Type BCBS   PT Start Time 1230   PT Stop Time 1314   PT Time Calculation (min) 44 min   Equipment Utilized During Treatment Gait belt   Activity Tolerance Patient tolerated treatment well   Behavior During Therapy [facility] for tasks assessed/performed      Past Medical History:  Diagnosis Date  . Back pain   . Chest pain   . DDD (degenerative disc disease), cervical   . Depression   . HTN (hypertension)     Past Surgical History:  Procedure Laterality Date  . AMPUTATION Bilateral 01/09/2016   Procedure: Bilateral Above Knee Amputation, Apply Wound VAC;  Surgeon: Newt Minion, MD;  Location: [place];  Service: Orthopedics;  Laterality: Bilateral;  . FASCIOTOMY Bilateral 01/03/2016   Procedure: FASCIOTOMY;  Surgeon: Leandrew Koyanagi, MD;  Location: [place];  Service: Orthopedics;  Laterality: Bilateral;  . I&D EXTREMITY Bilateral 01/05/2016   Procedure: IRRIGATION AND DEBRIDEMENT BILATERAL LOWER EXTREMITIES; WOUND VAC CHANGE;  Surgeon: Leandrew Koyanagi, MD;  Location: [facility];  Service: Orthopedics;  Laterality: Bilateral;  . I&D EXTREMITY Left 01/07/2016   Procedure: Irrigation and Debridement of Left Lower Extremity with Application of Wound Vac;  Surgeon: Leandrew Koyanagi, MD;  Location: [facility];  Service: Orthopedics;  Laterality: Left;  Marland Kitchen VASECTOMY      There were no vitals filed for this visit.      Subjective Assessment - 07/17/16 1235    Subjective He has been feeling "blah" but no fevers. No falls. He is wearing prostheses 6-7hrs  1x/day     Patient is accompained by: Family member   Pertinent History DDD, chronic low back pain, HTN, 2 back surgeries   Limitations Lifting;Standing;Walking   Patient Stated Goals to use prostheses to get around in home & community. Go fishing   Currently in Pain? Yes   Pain Score 5    Pain Location Back   Pain Orientation Mid;Lower   Pain Descriptors / Indicators Aching   Pain Type Chronic pain   Pain Onset More than a month ago   Pain Frequency Constant   Aggravating Factors  not moving, stiffness   Pain Relieving Factors meds, rest     Prosthetic Training with bilateral foreshortened Transfemoral prostheses: Pt ambulated 250' with forearm crutches with 4-pt pattern with supervision, verbal cues on step length, wt shift & upright posture. PT demo technique for turning around obstacles and stepping over obstacles. Pt performed  figure 8 turns 5 reps with cues with min guard with forearm crutches. Pt stepped over 2" x 4" and 4" X 2" with forearm crutches with min guard & verbal cues.  Sidestepping & backwards gait with forearm crutches with min guard 10' each direction with verbal cues. Pt negotiated stairs 4 steps with left rail/right crutch & 4 steps with right rail / left crutch with min guard & verbal cues.  PT instructed pt in need to increase wear to increase function throughout his day. PT recommended wear 6 hrs 2x/day initiating  first wear upon arising and removing for 2-3 hrs mid-day. Pt verbalized understanding.                             PT Education - 07/17/16 1245    Education provided Yes   Education Details Increasing activity tolerance / level with short walks high frequency, medium walks (in/out home & buildings) 4-8 times & long walks (his max tolerance) 1-2 times per day and how girlfriend to supervise for safety.    Person(s) Educated Patient;Caregiver(s)   Methods Explanation;Verbal cues   Comprehension Verbalized understanding           PT Short Term Goals - 07/08/16 1400      PT SHORT TERM GOAL #1   Title Patient tolerates wear of prostheses >12hrs total /day without skin issues or limb pain. (Target Date: 07/25/2016)   Baseline Pt consistently wears prosthesis 5-6hrs/day, had good skin integrity but continues to have trouble with itchiness on skin; 06/26/16.   Time 4   Period Weeks   Status On-going     PT SHORT TERM GOAL #2   Title Patient verbalizes proper cleaning & demo proper donning of prostheses.  (Target Date: 06/27/2016)   Baseline Verbalizes proper cleaning and requires intermittent cues for technique of donning ply socks; 06/26/16.   Time 4   Period Weeks   Status Partially Met     PT SHORT TERM GOAL #3   Title Patient stands with LUE support on walker or crutch 5" & towards ground within 5" of floor with supervision.  (Target Date: 06/27/2016)   Baseline Met, 06/26/16.   Time 4   Period Weeks   Status Achieved     PT SHORT TERM GOAL #4   Title Patient ambulates 37' with RW or crutches & prostheses with minA.  (Target Date: 06/27/2016)   Baseline Met, 06/26/16.   Time 4   Period Weeks   Status Achieved     PT SHORT TERM GOAL #5   Title Patient reports back pain increases </= 3 increments on 0-10 scale with above activities.  (Target Date: 06/27/2016)   Baseline Met, 06/26/16.   Time 4   Period Weeks   Status Achieved     PT SHORT TERM GOAL #6   Title Patient ambulates 200' with crutches & prostheses with supervision. (Target Date: 07/25/2016)   Time 4   Period Weeks   Status On-going     PT SHORT TERM GOAL #7   Title Patient negotiates ramps & curbs with crutches & prostheses with minA. (Target Date: 07/25/2016)   Time 4   Period Weeks   Status On-going     PT SHORT TERM GOAL #8   Title Patient reaches 10" anteriorly, manages pants & donnes jacket standing with intermittent UE support with supervision. (Target Date: 07/25/2016)   Time 4   Period Weeks   Status On-going           PT Long  Term Goals - 07/01/16 1627      PT LONG TERM GOAL #1   Title Patient demonstrates & verbalizes proper prosthetic care to enable safe use of prostheses.  (Target Date: 08/22/2016)   Time 12   Period Weeks   Status On-going     PT LONG TERM GOAL #2   Title Patient tolerates wear of prostheses >80% of awake hours without skin issues or limb pain to enable function during his day. (Target Date: 08/22/2016)  Time 12   Period Weeks   Status On-going     PT LONG TERM GOAL #3   Title Patient has standing balance tolerating standing 2 minutes without UE support turning head to scan environment and with LUE support reaches 7" anteriorly & to floor modified independent. (Target Date: 08/22/2016)   Time 12   Period Weeks   Status On-going     PT LONG TERM GOAL #4   Title Patient ambulates 300' with prostheses & LRAD modified independent for community mobility. (Target Date: 08/22/2016)   Time 12   Period Weeks   Status On-going     PT LONG TERM GOAL #5   Title Patient negotiates ramps, curbs & stairs with LRAD & prostheses modified independent for community access. (Target Date: 08/22/2016)   Time 12   Period Weeks   Status On-going     PT LONG TERM GOAL #6   Title Patient reports back pain increases </= 2 increments on 0-10 scale with above activities. (Target Date: 08/22/2016)   Time 12   Period Weeks   Status On-going               Plan - 07/17/16 1442    Clinical Impression Statement Patient improved ability to ambulate around & over obstacles with skilled instructions in technique. He improved balance reactions with movement in different directions. He appears to understand need to increase wear & activity level outside of PT.    Rehab Potential Good   PT Frequency 2x / week   PT Duration 12 weeks   PT Treatment/Interventions ADLs/Self Care Home Management;Electrical Stimulation;Ultrasound;Moist Heat;DME Instruction;Gait training;Stair training;Functional mobility  training;Therapeutic activities;Therapeutic exercise;Balance training;Neuromuscular re-education;Patient/family education;Prosthetic Training   PT Next Visit Plan gait and standing balance/endurance, gait with crutches including barriers   Consulted and Agree with Plan of Care Patient      Patient will benefit from skilled therapeutic intervention in order to improve the following deficits and impairments:  Abnormal gait, Decreased activity tolerance, Decreased balance, Decreased range of motion, Decreased knowledge of use of DME, Decreased endurance, Decreased strength, Improper body mechanics, Postural dysfunction, Prosthetic Dependency, Pain  Visit Diagnosis: Abnormal posture  Unsteadiness on feet  Muscle weakness (generalized)  Other abnormalities of gait and mobility  Chronic midline low back pain, with sciatica presence unspecified     Problem List Patient Active Problem List   Diagnosis Date Noted  . Seasonal allergies 02/08/2016  . Anemia due to other cause 02/08/2016  . Chronic pain syndrome 02/08/2016  . Depression 02/08/2016  . Post-operative pain   . Ischemia of lower extremity 01/23/2016  . Hypoalbuminemia due to protein-calorie malnutrition (HCC)   . Dysuria   . Tobacco abuse   . Constipation due to pain medication   . Chronic low back pain   . Benign essential HTN   . Asthma   . Hyponatremia   . CKD (chronic kidney disease)   . Anemia of chronic disease   . Transaminitis   . Phantom limb syndrome with pain (HCC)   . Status post bilateral above knee amputation (HCC)   . Acute kidney injury (HCC)   . Syncope   . Pressure ulcer 01/12/2016  . Fall   . Encounter for central line placement   . Urinary retention   . AKI (acute kidney injury) (HCC)   . Traumatic rhabdomyolysis (HCC)   . Rhabdomyolysis 01/03/2016  . Nontraumatic compartment syndrome of leg 01/03/2016  . Chest pain 10/27/2012  . HTN (hypertension) 10/27/2012  .  Back pain 10/27/2012     Jamey Reas PT, DPT 07/17/2016, 3:59 PM  [facility] [address] [place], [place], [address] Phone: [phone]   Fax:  [phone]  Name: Jeffery Gross MRN: [id number] Date of Birth: [birth year]

## 2016-07-22 ENCOUNTER — Encounter: Payer: BLUE CROSS/BLUE SHIELD | Admitting: Physical Therapy

## 2016-07-24 ENCOUNTER — Encounter: Payer: BLUE CROSS/BLUE SHIELD | Admitting: Physical Therapy

## 2016-07-29 ENCOUNTER — Encounter: Payer: BLUE CROSS/BLUE SHIELD | Admitting: Physical Therapy

## 2016-07-31 ENCOUNTER — Encounter: Payer: BLUE CROSS/BLUE SHIELD | Admitting: Physical Therapy

## 2016-08-05 ENCOUNTER — Encounter: Payer: BLUE CROSS/BLUE SHIELD | Admitting: Physical Therapy

## 2016-08-07 ENCOUNTER — Encounter: Payer: BLUE CROSS/BLUE SHIELD | Admitting: Physical Therapy

## 2016-08-12 ENCOUNTER — Encounter: Payer: BLUE CROSS/BLUE SHIELD | Admitting: Physical Therapy

## 2016-08-14 ENCOUNTER — Encounter: Payer: BLUE CROSS/BLUE SHIELD | Admitting: Physical Therapy

## 2016-08-19 ENCOUNTER — Encounter: Payer: BLUE CROSS/BLUE SHIELD | Admitting: Physical Therapy

## 2016-08-21 ENCOUNTER — Encounter: Payer: BLUE CROSS/BLUE SHIELD | Admitting: Physical Therapy

## 2016-08-26 ENCOUNTER — Encounter: Payer: BLUE CROSS/BLUE SHIELD | Admitting: Physical Therapy

## 2016-08-28 ENCOUNTER — Encounter: Payer: BLUE CROSS/BLUE SHIELD | Admitting: Physical Therapy

## 2016-09-01 ENCOUNTER — Encounter (INDEPENDENT_AMBULATORY_CARE_PROVIDER_SITE_OTHER): Payer: Self-pay

## 2016-09-01 ENCOUNTER — Encounter (INDEPENDENT_AMBULATORY_CARE_PROVIDER_SITE_OTHER): Payer: Self-pay | Admitting: Orthopedic Surgery

## 2016-09-01 ENCOUNTER — Ambulatory Visit (INDEPENDENT_AMBULATORY_CARE_PROVIDER_SITE_OTHER): Payer: Self-pay | Admitting: Orthopedic Surgery

## 2016-09-01 VITALS — Ht 73.0 in | Wt 154.0 lb

## 2016-09-01 DIAGNOSIS — Z89611 Acquired absence of right leg above knee: Secondary | ICD-10-CM

## 2016-09-01 DIAGNOSIS — Z89612 Acquired absence of left leg above knee: Secondary | ICD-10-CM

## 2016-09-01 DIAGNOSIS — G546 Phantom limb syndrome with pain: Secondary | ICD-10-CM

## 2016-09-01 NOTE — Progress Notes (Signed)
Office Visit Note   Patient: Jeffery Gross           Date of Birth: Jun 02, 1961           MRN: [id number] Visit Date: 09/01/2016              Requested by: Howard PouchLauren Feng, MD [address] [place], [place] [id number] PCP: Howard PouchLauren Feng, MD  Chief Complaint  Patient presents with  . Right Leg - Follow-up    Bilateral AKA 01/09/16  . Left Leg - Follow-up    HPI: Pt is s/p bilateral above the knee amputations on 01/09/16. He was last seen in office 2 months ago for phantom pain symptoms and was given rx for Lyrica and Cymbalta. He is only taking the Cymbalta currently. He is wearing prosthetics bilaterally and using Loft strand crutches. He is concerned about rubbing in the prosthetics causing areas to open on the skin. He is concerned about the skin looking "purple"  He is no longer going to [facility]hab due to insurance being cancelled. Rodena MedinAutumn L Forrest, RMA    Patient is a 56 year old gentleman who presents today in follow-up he is status post bilateral above the knee amputations in June of last year. He has been taking Cymbalta for his phantom pain. This is working moderately well. He is wearing his prosthetics bilaterally today. Does use a Lofstrand crutches bilaterally. Has had continued trouble with ambulation. He feels unsteady in his prosthetics. These do not feel loose. He is using about 15 ply on the right and 11 ply on the left. He no longer has insurance and has not been able to attend any further gait training or physical therapy. He does have a small ulcer on the left he is worried about.    Assessment & Plan: Visit Diagnoses:  1. Status post bilateral above knee amputation (HCC)   2. Phantom limb syndrome with pain (HCC)     Plan: Continue to cleanse ulcer daily apply Neosporin and a Band-Aid. He'll minimize use of his prosthetic on the left until this is healed.  Follow-Up Instructions: Return if symptoms worsen or fail to improve.   Ortho Exam Physical Exam    Constitutional: Appears well-developed.  Head: Normocephalic.  Eyes: EOM are normal.  Neck: Normal range of motion.  Cardiovascular: Normal rate.   Pulmonary/Chest: Effort normal.  Neurological: Is alert.  Skin: Skin is warm.  Psychiatric: Has a normal mood and affect. Bilateral above the knee amputations are well healed. There is a superficial ulceration on the left that is 1 cm in diameter from end bearing. Him this has granulation tissue in the wound bed there is no drainage no surrounding erythema odor or sign of infection  Imaging: No results found.  Orders:  No orders of the defined types were placed in this encounter.  No orders of the defined types were placed in this encounter.    Procedures: No procedures performed  Clinical Data: No additional findings.  Subjective: Review of Systems  Constitutional: Negative for chills and fever.  Cardiovascular: Negative for leg swelling.  Skin: Positive for wound. Negative for color change and rash.    Objective: Vital Signs: Ht 6\' 1"  (1.854 m)   Wt 154 lb (69.9 kg)   BMI 20.32 kg/m   Specialty Comments:  No specialty comments available.  PMFS History: Patient Active Problem List   Diagnosis Date Noted  . Seasonal allergies 02/08/2016  . Anemia due to other cause 02/08/2016  .  Chronic pain syndrome 02/08/2016  . Depression 02/08/2016  . Post-operative pain   . Ischemia of lower extremity 01/23/2016  . Hypoalbuminemia due to protein-calorie malnutrition (HCC)   . Dysuria   . Tobacco abuse   . Constipation due to pain medication   . Chronic low back pain   . Benign essential HTN   . Asthma   . Hyponatremia   . CKD (chronic kidney disease)   . Anemia of chronic disease   . Transaminitis   . Phantom limb syndrome with pain (HCC)   . Status post bilateral above knee amputation (HCC)   . Acute kidney injury (HCC)   . Syncope   . Pressure ulcer 01/12/2016  . Fall   . Encounter for central line placement    . Urinary retention   . AKI (acute kidney injury) (HCC)   . Traumatic rhabdomyolysis (HCC)   . Rhabdomyolysis 01/03/2016  . Nontraumatic compartment syndrome of leg 01/03/2016  . Chest pain 10/27/2012  . HTN (hypertension) 10/27/2012  . Back pain 10/27/2012   Past Medical History:  Diagnosis Date  . Back pain   . Chest pain   . DDD (degenerative disc disease), cervical   . Depression   . HTN (hypertension)     Family History  Problem Relation Age of Onset  . Hypertension Father     Past Surgical History:  Procedure Laterality Date  . AMPUTATION Bilateral 01/09/2016   Procedure: Bilateral Above Knee Amputation, Apply Wound VAC;  Surgeon: Nadara Mustard, MD;  [facility];  Service: Orthopedics;  Laterality: Bilateral;  . FASCIOTOMY Bilateral 01/03/2016   Procedure: FASCIOTOMY;  Surgeon: Tarry Kos, MD;  [facility];  Service: Orthopedics;  Laterality: Bilateral;  . I&D EXTREMITY Bilateral 01/05/2016   Procedure: IRRIGATION AND DEBRIDEMENT BILATERAL LOWER EXTREMITIES; WOUND VAC CHANGE;  Surgeon: Tarry Kos, MD;  [facility];  Service: Orthopedics;  Laterality: Bilateral;  . I&D EXTREMITY Left 01/07/2016   Procedure: Irrigation and Debridement of Left Lower Extremity with Application of Wound Vac;  Surgeon: Tarry Kos, MD;  [facility];  Service: Orthopedics;  Laterality: Left;  Marland Kitchen VASECTOMY     Social History   Occupational History  . Not on file.   Social History Main Topics  . Smoking status: Current Some Day Smoker    Types: Cigars  . Smokeless tobacco: Current User  . Alcohol use 0.0 oz/week     Comment: 12 pack a month  . Drug use: Unknown  . Sexual activity: Not on file

## 2016-11-07 ENCOUNTER — Ambulatory Visit (INDEPENDENT_AMBULATORY_CARE_PROVIDER_SITE_OTHER): Payer: Medicaid Other | Admitting: Orthopedic Surgery

## 2016-11-07 DIAGNOSIS — Z89611 Acquired absence of right leg above knee: Secondary | ICD-10-CM

## 2016-11-07 DIAGNOSIS — Z89612 Acquired absence of left leg above knee: Secondary | ICD-10-CM

## 2016-11-07 NOTE — Progress Notes (Signed)
Office Visit Note   Patient: Jeffery Gross           Date of Birth: [birth year]           MRN: [id number] Visit Date: 11/07/2016              Requested by: Howard Pouch, MD [address] [place], [place] [address] PCP: Howard Pouch, MD  No chief complaint on file.     HPI: Patient is a 56 year old gentleman who presents today with bilateral above the knee amputations. He has been doing quite well. Is up on his feet and prosthetics has returned to work. He doesn't have any skin breakdown however does have double up of callus from an bearing bilaterally. He has ill fitting sockets. The liners are broken down. Is having to use up to 16 ply bilaterally daily. Rubbing and callus buildup due to ill fitting sockets.  Assessment & Plan: Visit Diagnoses:  1. Status post bilateral above knee amputation (HCC)     Plan: Have provided an order for new socket to new liners. We'll follow with [facility] for fabrication. Follow-up in office with [place] as needed.  Follow-Up Instructions: Return if symptoms worsen or fail to improve.   Ortho Exam  Patient is alert, oriented, no adenopathy, well-dressed, normal affect, normal respiratory effort. Lateral above the knee amputations are well healed well consolidated there is no skin breakdown. Does have callus build up distally.   Imaging: No results found.  Labs: Lab Results  Component Value Date   REPTSTATUS 01/25/2016 FINAL 01/24/2016   CULT NO GROWTH 01/24/2016    Orders:  No orders of the defined types were placed in this encounter.  No orders of the defined types were placed in this encounter.    Procedures: No procedures performed  Clinical Data: No additional findings.  ROS:  All other systems negative, except as noted in the HPI. Review of Systems  Constitutional: Negative for chills and fever.  Cardiovascular: Negative for leg swelling.  Skin: Negative for color change and wound.    Objective: Vital Signs: There were no  vitals taken for this visit.  Specialty Comments:  No specialty comments available.  PMFS History: Patient Active Problem List   Diagnosis Date Noted  . Seasonal allergies 02/08/2016  . Anemia due to other cause 02/08/2016  . Chronic pain syndrome 02/08/2016  . Depression 02/08/2016  . Post-operative pain   . Ischemia of lower extremity 01/23/2016  . Hypoalbuminemia due to protein-calorie malnutrition (HCC)   . Dysuria   . Tobacco abuse   . Constipation due to pain medication   . Chronic low back pain   . Benign essential HTN   . Asthma   . Hyponatremia   . CKD (chronic kidney disease)   . Anemia of chronic disease   . Transaminitis   . Phantom limb syndrome with pain (HCC)   . Status post bilateral above knee amputation (HCC)   . Acute kidney injury (HCC)   . Syncope   . Pressure ulcer 01/12/2016  . Fall   . Encounter for central line placement   . Urinary retention   . AKI (acute kidney injury) (HCC)   . Traumatic rhabdomyolysis (HCC)   . Rhabdomyolysis 01/03/2016  . Nontraumatic compartment syndrome of leg 01/03/2016  . Chest pain 10/27/2012  . HTN (hypertension) 10/27/2012  . Back pain 10/27/2012   Past Medical History:  Diagnosis Date  . Back pain   . Chest pain   .  DDD (degenerative disc disease), cervical   . Depression   . HTN (hypertension)     Family History  Problem Relation Age of Onset  . Hypertension Father     Past Surgical History:  Procedure Laterality Date  . AMPUTATION Bilateral 01/09/2016   Procedure: Bilateral Above Knee Amputation, Apply Wound VAC;  Surgeon: Nadara Mustard, MD;  [facility];  Service: Orthopedics;  Laterality: Bilateral;  . FASCIOTOMY Bilateral 01/03/2016   Procedure: FASCIOTOMY;  Surgeon: Tarry Kos, MD;  [facility];  Service: Orthopedics;  Laterality: Bilateral;  . I&D EXTREMITY Bilateral 01/05/2016   Procedure: IRRIGATION AND DEBRIDEMENT BILATERAL LOWER EXTREMITIES; WOUND VAC CHANGE;  Surgeon: Tarry Kos,  MD;  [facility];  Service: Orthopedics;  Laterality: Bilateral;  . I&D EXTREMITY Left 01/07/2016   Procedure: Irrigation and Debridement of Left Lower Extremity with Application of Wound Vac;  Surgeon: Tarry Kos, MD;  [facility];  Service: Orthopedics;  Laterality: Left;  Marland Kitchen VASECTOMY     Social History   Occupational History  . Not on file.   Social History Main Topics  . Smoking status: Current Some Day Smoker    Types: Cigars  . Smokeless tobacco: Current User  . Alcohol use 0.0 oz/week     Comment: 12 pack a month  . Drug use: Unknown  . Sexual activity: Not on file

## 2016-11-27 ENCOUNTER — Telehealth (INDEPENDENT_AMBULATORY_CARE_PROVIDER_SITE_OTHER): Payer: Self-pay | Admitting: Orthopedic Surgery

## 2016-11-27 NOTE — Telephone Encounter (Signed)
LAST 3 OV NOTES FAXED TO [facility] [phone]

## 2017-03-12 ENCOUNTER — Telehealth (INDEPENDENT_AMBULATORY_CARE_PROVIDER_SITE_OTHER): Payer: Self-pay | Admitting: Orthopedic Surgery

## 2017-03-12 NOTE — Telephone Encounter (Signed)
Called pt and he states that he needs rx to be faxed to [facility] " for some feet" pt is a bilat amputee and I will call Thayer Ohm with [facility] to find out more specifically what he needs rx to say.

## 2017-03-12 NOTE — Telephone Encounter (Signed)
Patient called asking for a call back regarding his prosthetics for both legs. If you could give him a call back at [phone]

## 2017-03-13 ENCOUNTER — Other Ambulatory Visit (INDEPENDENT_AMBULATORY_CARE_PROVIDER_SITE_OTHER): Payer: Self-pay

## 2017-03-13 NOTE — Telephone Encounter (Signed)
Faxed an order to [facility] for prosthetic eval and supplies.

## 2017-03-21 IMAGING — CR DG ABDOMEN 1V
1 series · 1 of 1 positions shown · non-contrast
Comparison: CT 01/03/2016

CLINICAL DATA: Abdominal pain generalized.

EXAM:
ABDOMEN - 1 VIEW

[AP]
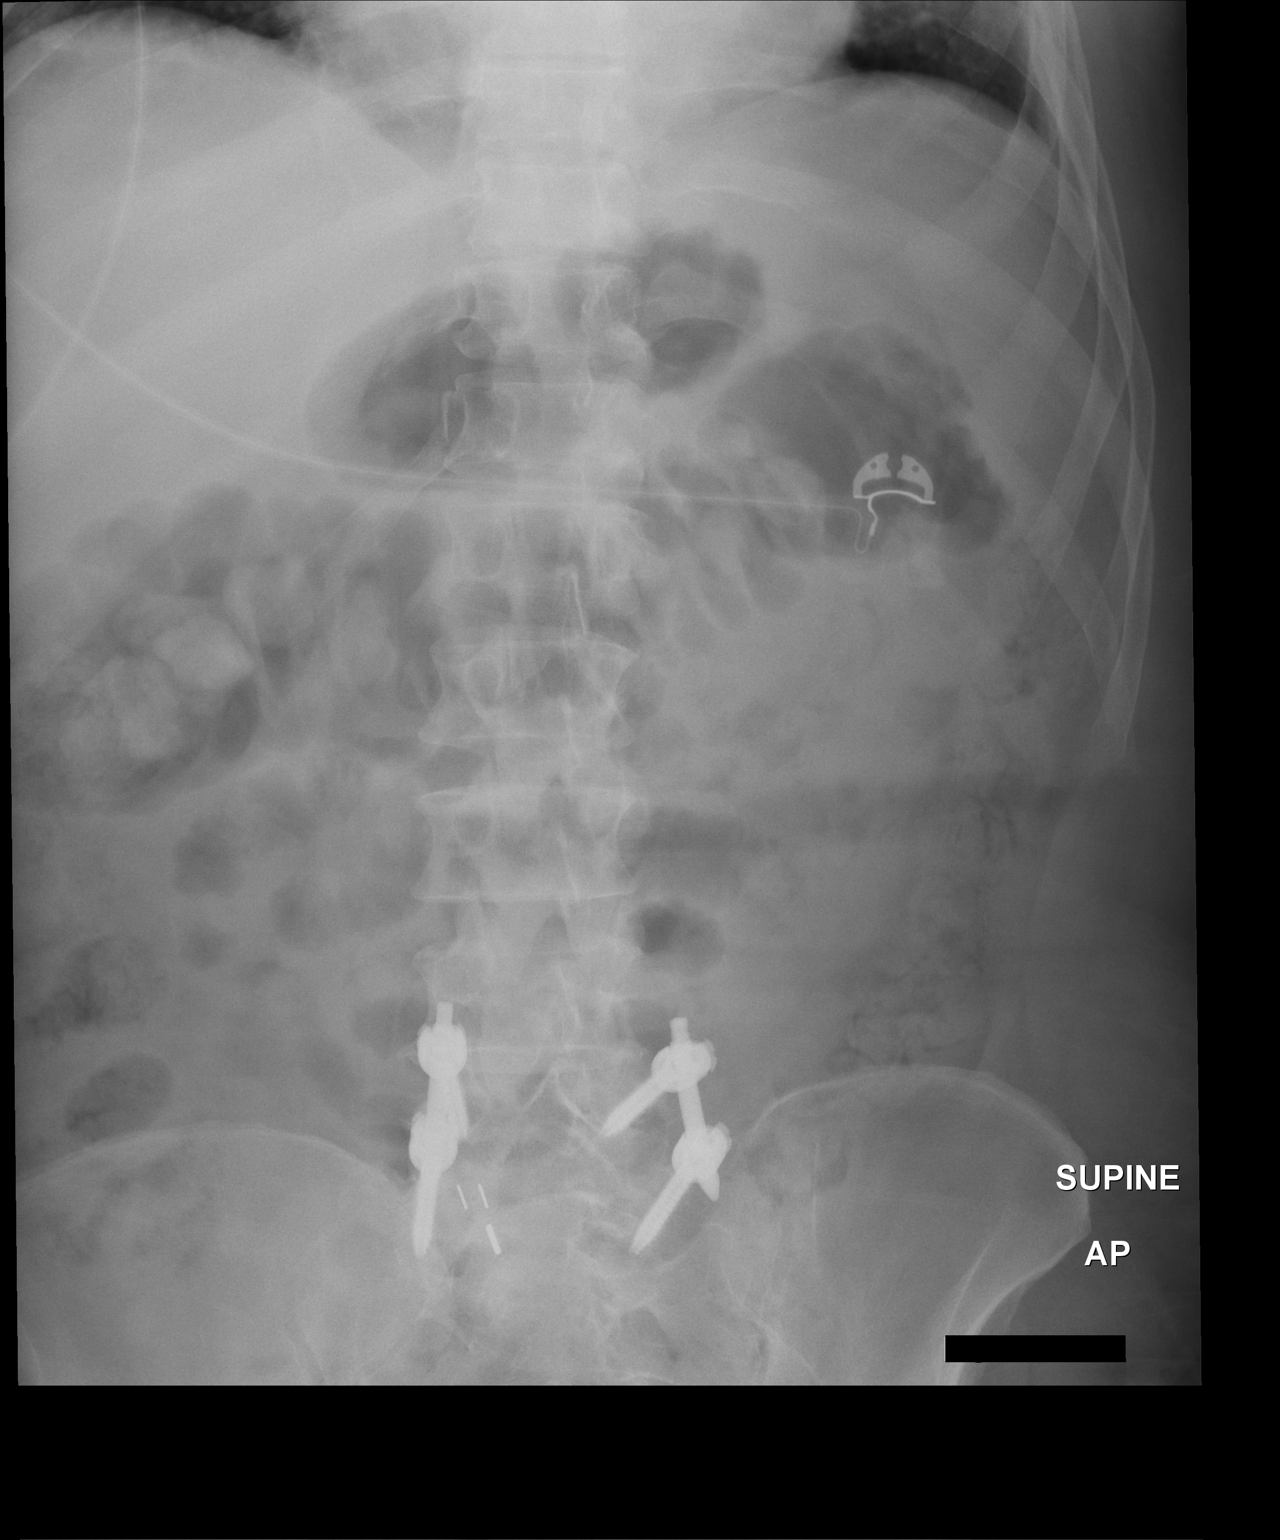

[1 of 1 positions shown; findings below may reference images not displayed]

FINDINGS: Bowel gas pattern is nonobstructive. There is mild fecal retention
throughout the colon. No free peritoneal air. No mass or mass
effect. Mild degenerate change of the spine. Fusion hardware intact
over the approximate L5-S1 level.
IMPRESSION: Nonobstructive bowel gas pattern.

## 2017-03-29 IMAGING — US IR US GUIDE VASC ACCESS RIGHT
1 series · 1 of 1 positions shown · non-contrast
Comparison: None

CLINICAL DATA: Acute kidney injury, needs access for hemodialysis

EXAM:
TUNNELED HEMODIALYSIS CATHETER PLACEMENT WITH ULTRASOUND AND
FLUOROSCOPIC GUIDANCE
TECHNIQUE: The procedure, risks, benefits, and alternatives were explained to
the patient. Questions regarding the procedure were encouraged and
answered. The patient understands and consents to the procedure. As
antibiotic prophylaxis, cefazolin 2 g was ordered pre-procedure and
administered intravenously within one hour of incision.Patency of
the right IJ vein was confirmed with ultrasound with image
documentation. An appropriate skin site was determined. Region was
prepped using maximum barrier technique including cap and mask,
sterile gown, sterile gloves, large sterile sheet, and Chlorhexidine
as cutaneous antisepsis. The region was infiltrated locally with 1%
lidocaine.

[Series 1: ir fluoro/shunt/fist · 1 of 1 slices shown]
[im 1/1]
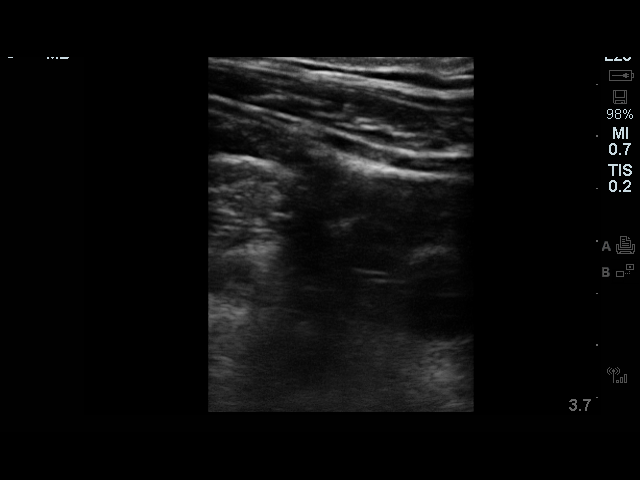

[1 of 1 positions shown; findings below may reference images not displayed]

Intravenous Fentanyl and Versed were administered as conscious
sedation during continuous monitoring of the patient's level of
consciousness and physiological / cardiorespiratory status by the
radiology RN, with a total moderate sedation time of 15 minutes.

Under real-time ultrasound guidance, the right IJ vein was accessed
with a 21 gauge micropuncture needle; the needle tip within the vein
was confirmed with ultrasound image documentation. Needle exchanged
over the 018 guidewire for transitional dilator, which allowed
advancement of a Benson wire into the IVC. Over this, an MPA
catheter was advanced.

A Palindrome 23 hemodialysis catheter was tunneled from the right
anterior chest wall approach to the right IJ dermatotomy site. The
MPA catheter was exchanged over an Amplatz wire for serial vascular
dilators which allow placement of a peel-away sheath, through which
the catheter was advanced under intermittent fluoroscopy, positioned
with its tips in the proximal and midright atrium. Spot chest
radiograph confirms good catheter position. No pneumothorax.
Catheter was flushed and primed per protocol. Catheter secured
externally with O Prolene sutures. The right IJ dermatotomy site was
closed with Dermabond.

COMPLICATIONS:
COMPLICATIONS
None immediate

FLUOROSCOPY TIME:  18 seconds, 2 mGy
IMPRESSION: 1. Technically successful placement of tunneled right IJ
hemodialysis catheter with ultrasound and fluoroscopic guidance.
Ready for routine use.

ACCESS:
Remains approachable for percutaneous intervention as needed.

## 2017-04-13 ENCOUNTER — Telehealth (INDEPENDENT_AMBULATORY_CARE_PROVIDER_SITE_OTHER): Payer: Self-pay | Admitting: Orthopedic Surgery

## 2017-04-13 NOTE — Telephone Encounter (Signed)
Last 2 ov note faxed to [facility] [phone]

## 2017-08-05 ENCOUNTER — Encounter: Payer: Self-pay | Admitting: Physical Therapy

## 2017-08-05 NOTE — Therapy (Signed)
[facility] [address] [place], [place], [address] Phone: [phone]   Fax:  [phone]  Patient Details  Name: Jeffery Gross MRN: [id number] Date of Birth: 05-Jul-1961 Referring Provider:  Meridee Score, MD  Encounter Date: 08/05/2017  PHYSICAL THERAPY DISCHARGE SUMMARY  Visits from Start of Care: 12  Current functional level related to goals / functional outcomes: PT Short Term Goals - 07/08/16 1400      PT SHORT TERM GOAL #1   Title  Patient tolerates wear of prostheses >12hrs total /day without skin issues or limb pain. (Target Date: 07/25/2016)    Baseline  Pt consistently wears prosthesis 5-6hrs/day, had good skin integrity but continues to have trouble with itchiness on skin; 06/26/16.    Time  4    Period  Weeks    Status  On-going      PT SHORT TERM GOAL #2   Title  Patient verbalizes proper cleaning & demo proper donning of prostheses.  (Target Date: 06/27/2016)    Baseline  Verbalizes proper cleaning and requires intermittent cues for technique of donning ply socks; 06/26/16.    Time  4    Period  Weeks    Status  Partially Met      PT SHORT TERM GOAL #3   Title  Patient stands with LUE support on walker or crutch 5" & towards ground within 5" of floor with supervision.  (Target Date: 06/27/2016)    Baseline  Met, 06/26/16.    Time  4    Period  Weeks    Status  Achieved      PT SHORT TERM GOAL #4   Title  Patient ambulates 93' with RW or crutches & prostheses with minA.  (Target Date: 06/27/2016)    Baseline  Met, 06/26/16.    Time  4    Period  Weeks    Status  Achieved      PT SHORT TERM GOAL #5   Title  Patient reports back pain increases </= 3 increments on 0-10 scale with above activities.  (Target Date: 06/27/2016)    Baseline  Met, 06/26/16.    Time  4    Period  Weeks    Status  Achieved      PT SHORT TERM GOAL #6   Title  Patient ambulates 200' with crutches & prostheses with supervision.  (Target Date: 07/25/2016)    Time  4    Period  Weeks    Status  On-going      PT SHORT TERM GOAL #7   Title  Patient negotiates ramps & curbs with crutches & prostheses with minA. (Target Date: 07/25/2016)    Time  4    Period  Weeks    Status  On-going      PT SHORT TERM GOAL #8   Title  Patient reaches 10" anteriorly, manages pants & donnes jacket standing with intermittent UE support with supervision. (Target Date: 07/25/2016)    Time  4    Period  Weeks    Status  On-going      Patient did not return so unknown status of LTGs  PT Long Term Goals - 07/01/16 1627      PT LONG TERM GOAL #1   Title  Patient demonstrates & verbalizes proper prosthetic care to enable safe use of prostheses.  (Target Date: 08/22/2016)    Time  12    Period  Weeks    Status  On-going  PT LONG TERM GOAL #2   Title  Patient tolerates wear of prostheses >80% of awake hours without skin issues or limb pain to enable function during his day. (Target Date: 08/22/2016)    Time  12    Period  Weeks    Status  On-going      PT LONG TERM GOAL #3   Title  Patient has standing balance tolerating standing 2 minutes without UE support turning head to scan environment and with LUE support reaches 7" anteriorly & to floor modified independent. (Target Date: 08/22/2016)    Time  12    Period  Weeks    Status  On-going      PT LONG TERM GOAL #4   Title  Patient ambulates 300' with prostheses & LRAD modified independent for community mobility. (Target Date: 08/22/2016)    Time  12    Period  Weeks    Status  On-going      PT LONG TERM GOAL #5   Title  Patient negotiates ramps, curbs & stairs with LRAD & prostheses modified independent for community access. (Target Date: 08/22/2016)    Time  12    Period  Weeks    Status  On-going      PT LONG TERM GOAL #6   Title  Patient reports back pain increases </= 2 increments on 0-10 scale with above activities. (Target Date: 08/22/2016)    Time  12    Period  Weeks     Status  On-going         Remaining deficits: unknown   Education / Equipment: Prosthetic care  Plan: Patient agrees to discharge.  Patient goals were not met. Patient is being discharged due to not returning since the last visit.  ?????        Mindee Robledo  PT, DPT 08/05/2017, 12:23 PM  [facility] [address] [place], [place], [address] Phone: [phone]   Fax:  [phone]

## 2017-08-10 ENCOUNTER — Telehealth (INDEPENDENT_AMBULATORY_CARE_PROVIDER_SITE_OTHER): Payer: Self-pay | Admitting: Orthopedic Surgery

## 2017-08-10 NOTE — Telephone Encounter (Signed)
Patient called needing a prescription for new liners sent to [facility]. Patient did not have the fax #.

## 2017-08-10 NOTE — Telephone Encounter (Signed)
Prescription written will fax to [phone]

## 2018-02-08 ENCOUNTER — Telehealth (INDEPENDENT_AMBULATORY_CARE_PROVIDER_SITE_OTHER): Payer: Self-pay | Admitting: Orthopedic Surgery

## 2018-02-08 NOTE — Telephone Encounter (Signed)
Patient came into the clinic stating that he needs a [facility]  He said that his current legs one is shorter than the other and his legs are more narrow than before.  CB#[phone].  Thank you.

## 2018-02-08 NOTE — Telephone Encounter (Signed)
Please advise on script

## 2018-02-09 NOTE — Telephone Encounter (Signed)
Script faxed by April J

## 2018-02-09 NOTE — Telephone Encounter (Signed)
rx written

## 2018-03-08 ENCOUNTER — Ambulatory Visit (INDEPENDENT_AMBULATORY_CARE_PROVIDER_SITE_OTHER): Payer: Medicaid Other | Admitting: Orthopedic Surgery

## 2018-03-08 ENCOUNTER — Encounter (INDEPENDENT_AMBULATORY_CARE_PROVIDER_SITE_OTHER): Payer: Self-pay | Admitting: Orthopedic Surgery

## 2018-03-08 VITALS — Ht 73.0 in | Wt 150.0 lb

## 2018-03-08 DIAGNOSIS — Z89611 Acquired absence of right leg above knee: Secondary | ICD-10-CM | POA: Diagnosis not present

## 2018-03-08 DIAGNOSIS — Z89612 Acquired absence of left leg above knee: Secondary | ICD-10-CM

## 2018-03-08 NOTE — Progress Notes (Signed)
Office Visit Note   Patient: Jeffery Gross           Date of Birth: Jul 13, 1961           MRN: [id number] Visit Date: 03/08/2018              Requested by: Howard PouchFeng, Lauren, MD [address] [place], [place] [id number] PCP: Howard PouchFeng, Lauren, MD  Chief Complaint  Patient presents with  . Right Leg - Follow-up    01/09/16 2 years s/p bilateral above the knee amputations  . Left Leg - Follow-up      HPI: Patient is a 57 year old gentleman bilateral above-the-knee amputee who has significant decreased volume in both limbs he is wearing over 17 ply and his socks he has no rotational stability he is pistoning in and out of the socket and is unable to perform activities of daily living due to the poor fitting sockets.  Assessment & Plan: Visit Diagnoses:  1. Status post bilateral above knee amputation (HCC)     Plan: Patient is given a prescription for [employer] for a new suspension socket new suspension sleeve and materials and supplies as needed.  Follow-Up Instructions: Return if symptoms worsen or fail to improve.   Ortho Exam  Patient is alert, oriented, no adenopathy, well-dressed, normal affect, normal respiratory effort. Examination patient has significantly decreased volume in both lower extremities there is no exposed bone or tendon there is no cellulitis no ulcers.  Patient is currently using 2 crutches for ambulation he has no rotational stability with the sockets in place no varus or valgus stability.  Imaging: No results found. No images are attached to the encounter.  Labs: Lab Results  Component Value Date   REPTSTATUS 01/25/2016 FINAL 01/24/2016   CULT NO GROWTH 01/24/2016     Lab Results  Component Value Date   ALBUMIN 3.3 (L) 01/30/2016   ALBUMIN 2.8 (L) 01/24/2016   ALBUMIN 2.7 (L) 01/23/2016    Body mass index is 19.79 kg/m.  Orders:  No orders of the defined types were placed in this encounter.  No orders of the defined types were placed in this  encounter.    Procedures: No procedures performed  Clinical Data: No additional findings.  ROS:  All other systems negative, except as noted in the HPI. Review of Systems  Objective: Vital Signs: Ht 6\' 1"  (1.854 m)   Wt 150 lb (68 kg)   BMI 19.79 kg/m   Specialty Comments:  No specialty comments available.  PMFS History: Patient Active Problem List   Diagnosis Date Noted  . Seasonal allergies 02/08/2016  . Anemia due to other cause 02/08/2016  . Chronic pain syndrome 02/08/2016  . Depression 02/08/2016  . Post-operative pain   . Ischemia of lower extremity 01/23/2016  . Hypoalbuminemia due to protein-calorie malnutrition (HCC)   . Dysuria   . Tobacco abuse   . Constipation due to pain medication   . Chronic low back pain   . Benign essential HTN   . Asthma   . Hyponatremia   . CKD (chronic kidney disease)   . Anemia of chronic disease   . Transaminitis   . Phantom limb syndrome with pain (HCC)   . Status post bilateral above knee amputation (HCC)   . Acute kidney injury (HCC)   . Syncope   . Pressure ulcer 01/12/2016  . Fall   . Encounter for central line placement   . Urinary retention   . AKI (acute kidney injury) (  HCC)   . Traumatic rhabdomyolysis (HCC)   . Rhabdomyolysis 01/03/2016  . Nontraumatic compartment syndrome of leg 01/03/2016  . Chest pain 10/27/2012  . HTN (hypertension) 10/27/2012  . Back pain 10/27/2012   Past Medical History:  Diagnosis Date  . Back pain   . Chest pain   . DDD (degenerative disc disease), cervical   . Depression   . HTN (hypertension)     Family History  Problem Relation Age of Onset  . Hypertension Father     Past Surgical History:  Procedure Laterality Date  . AMPUTATION Bilateral 01/09/2016   Procedure: Bilateral Above Knee Amputation, Apply Wound VAC;  Surgeon: Nadara MustardMarcus V Duda, MD;  Location: [facility] OR;  Service: Orthopedics;  Laterality: Bilateral;  . FASCIOTOMY Bilateral 01/03/2016   Procedure: FASCIOTOMY;   Surgeon: Tarry KosNaiping M Xu, MD;  Location: [facility] OR;  Service: Orthopedics;  Laterality: Bilateral;  . I&D EXTREMITY Bilateral 01/05/2016   Procedure: IRRIGATION AND DEBRIDEMENT BILATERAL LOWER EXTREMITIES; WOUND VAC CHANGE;  Surgeon: Tarry KosNaiping M Xu, MD;  Location: [facility] OR;  Service: Orthopedics;  Laterality: Bilateral;  . I&D EXTREMITY Left 01/07/2016   Procedure: Irrigation and Debridement of Left Lower Extremity with Application of Wound Vac;  Surgeon: Tarry KosNaiping M Xu, MD;  Location: [facility] OR;  Service: Orthopedics;  Laterality: Left;  Marland Kitchen. VASECTOMY     Social History   Occupational History  . Not on file  Tobacco Use  . Smoking status: Current Some Day Smoker    Types: Cigars  . Smokeless tobacco: Current User  Substance and Sexual Activity  . Alcohol use: Yes    Alcohol/week: 0.0 standard drinks    Comment: 12 pack a month  . Drug use: Not on file  . Sexual activity: Not on file

## 2018-04-13 ENCOUNTER — Telehealth: Payer: Self-pay | Admitting: Student in an Organized Health Care Education/Training Program

## 2018-04-13 NOTE — Telephone Encounter (Signed)
Pt is hoping to come in soon, was happy to receive call. Questions regarding insurance and more before he can set up health maintenance appt. Will speak with front-office for follow-up call. **Pt is unable to callback due to phone issues but can still receive calls.

## 2018-06-28 ENCOUNTER — Ambulatory Visit: Payer: BLUE CROSS/BLUE SHIELD | Admitting: Family Medicine

## 2019-03-09 ENCOUNTER — Encounter: Payer: Medicaid Other | Admitting: Family Medicine

## 2019-03-22 ENCOUNTER — Encounter: Payer: Medicaid Other | Admitting: Family Medicine

## 2019-03-29 ENCOUNTER — Encounter: Payer: Self-pay | Admitting: Family Medicine

## 2019-04-11 ENCOUNTER — Encounter: Payer: Medicaid Other | Admitting: Family Medicine

## 2019-04-25 ENCOUNTER — Ambulatory Visit: Payer: Medicaid Other | Admitting: Orthopedic Surgery

## 2019-04-25 ENCOUNTER — Other Ambulatory Visit: Payer: Self-pay

## 2019-04-25 ENCOUNTER — Ambulatory Visit (INDEPENDENT_AMBULATORY_CARE_PROVIDER_SITE_OTHER): Payer: Medicaid Other | Admitting: Orthopedic Surgery

## 2019-04-25 ENCOUNTER — Encounter: Payer: Self-pay | Admitting: Orthopedic Surgery

## 2019-04-25 DIAGNOSIS — Z89611 Acquired absence of right leg above knee: Secondary | ICD-10-CM

## 2019-04-25 DIAGNOSIS — Z89612 Acquired absence of left leg above knee: Secondary | ICD-10-CM | POA: Diagnosis not present

## 2019-04-25 NOTE — Progress Notes (Signed)
Office Visit Note   Patient: Jeffery Gross           Date of Birth: [birth year]           MRN: [id number] Visit Date: 04/25/2019              Requested by: Kathrene Alu, MD [address],  [place] [address] PCP: Kathrene Alu, MD  Chief Complaint  Patient presents with  . Left Leg - Pain  . Right Leg - Pain      HPI: Patient is a 58 year old gentleman who presents for evaluation for bilateral above-the-knee amputations.  Patient has an unstable knee on the right above-the-knee amputation and has a poor fitting socket on the left.  Patient is developing an bearing ulcers and blisters on his left above-the-knee amputation.  Assessment & Plan: Visit Diagnoses:  1. Status post bilateral above knee amputation ([place])     Plan: Patient will need new socket and new knee.  Patient has been able to function well with his above-the-knee prosthetic limbs he now has instability on the left due to the poor fitting socket and instability with the right knee which is loose and not functional.  Patient has the ability strength and need to resume his normal levels of activities with being independent with his legs he has a strong desire to resume wearing proper fitting prosthesis.  Patient is a K3 level ambulator he walks in the community at [place] is up and down stairs and will need a computer need to meet his needs.  Follow-Up Instructions: Return if symptoms worsen or fail to improve.   Ortho Exam  Patient is alert, oriented, no adenopathy, well-dressed, normal affect, normal respiratory effort. Examination patient is currently ambulating with broken [place] crutches.  Left leg he has an end bearing blisters and ulcers over the residual limb there is no full-thickness skin defect no cellulitis no signs of infection.  Imaging: No results found. No images are attached to the encounter.  Labs: Lab Results  Component Value Date   REPTSTATUS 01/25/2016 FINAL  01/24/2016   CULT NO GROWTH 01/24/2016     Lab Results  Component Value Date   ALBUMIN 3.3 (L) 01/30/2016   ALBUMIN 2.8 (L) 01/24/2016   ALBUMIN 2.7 (L) 01/23/2016    Lab Results  Component Value Date   MG 2.3 01/07/2016   No results found for: VD25OH  No results found for: PREALBUMIN CBC EXTENDED Latest Ref Rng & Units 02/08/2016 01/24/2016 01/22/2016  WBC 3.8 - 10.8 K/uL 8.2 6.9 9.0  RBC 4.20 - 5.80 MIL/uL 4.07(L) 3.65(L) 3.66(L)  HGB 13.2 - 17.1 g/dL 11.4(L) 10.2(L) 10.2(L)  HCT 38.5 - 50.0 % 33.9(L) 32.0(L) 32.0(L)  PLT 140 - 400 K/uL 228 240 311  NEUTROABS 1.7 - 7.7 K/uL - 3.3 -  LYMPHSABS 0.7 - 4.0 K/uL - 2.2 -     There is no height or weight on file to calculate BMI.  Orders:  No orders of the defined types were placed in this encounter.  No orders of the defined types were placed in this encounter.    Procedures: No procedures performed  Clinical Data: No additional findings.  ROS:  All other systems negative, except as noted in the HPI. Review of Systems  Objective: Vital Signs: There were no vitals taken for this visit.  Specialty Comments:  No specialty comments available.  PMFS History: Patient Active Problem List   Diagnosis Date Noted  .  Seasonal allergies 02/08/2016  . Anemia due to other cause 02/08/2016  . Chronic pain syndrome 02/08/2016  . Depression 02/08/2016  . Post-operative pain   . Ischemia of lower extremity 01/23/2016  . Hypoalbuminemia due to protein-calorie malnutrition (HCC)   . Dysuria   . Tobacco abuse   . Constipation due to pain medication   . Chronic low back pain   . Benign essential HTN   . Asthma   . Hyponatremia   . CKD (chronic kidney disease)   . Anemia of chronic disease   . Transaminitis   . Phantom limb syndrome with pain (HCC)   . Status post bilateral above knee amputation (HCC)   . Acute kidney injury (HCC)   . Syncope   . Pressure ulcer 01/12/2016  . Fall   . Encounter for central line  placement   . Urinary retention   . AKI (acute kidney injury) (HCC)   . Traumatic rhabdomyolysis (HCC)   . Rhabdomyolysis 01/03/2016  . Nontraumatic compartment syndrome of leg 01/03/2016  . Chest pain 10/27/2012  . HTN (hypertension) 10/27/2012  . Back pain 10/27/2012   Past Medical History:  Diagnosis Date  . Back pain   . Chest pain   . DDD (degenerative disc disease), cervical   . Depression   . HTN (hypertension)     Family History  Problem Relation Age of Onset  . Hypertension Father     Past Surgical History:  Procedure Laterality Date  . AMPUTATION Bilateral 01/09/2016   Procedure: Bilateral Above Knee Amputation, Apply Wound VAC;  Surgeon: Nadara Mustard, MD;  Location: [facility];  Service: Orthopedics;  Laterality: Bilateral;  . FASCIOTOMY Bilateral 01/03/2016   Procedure: FASCIOTOMY;  Surgeon: Tarry Kos, MD;  Location: [facility];  Service: Orthopedics;  Laterality: Bilateral;  . I&D EXTREMITY Bilateral 01/05/2016   Procedure: IRRIGATION AND DEBRIDEMENT BILATERAL LOWER EXTREMITIES; WOUND VAC CHANGE;  Surgeon: Tarry Kos, MD;  Location: [facility];  Service: Orthopedics;  Laterality: Bilateral;  . I&D EXTREMITY Left 01/07/2016   Procedure: Irrigation and Debridement of Left Lower Extremity with Application of Wound Vac;  Surgeon: Tarry Kos, MD;  Location: [facility];  Service: Orthopedics;  Laterality: Left;  Marland Kitchen VASECTOMY     Social History   Occupational History  . Not on file  Tobacco Use  . Smoking status: Current Some Day Smoker    Types: Cigars  . Smokeless tobacco: Current User  Substance and Sexual Activity  . Alcohol use: Yes    Alcohol/week: 0.0 standard drinks    Comment: 12 pack a month  . Drug use: Not on file  . Sexual activity: Not on file

## 2020-03-27 ENCOUNTER — Ambulatory Visit: Payer: Medicaid Other | Admitting: Urology

## 2020-04-24 ENCOUNTER — Other Ambulatory Visit: Payer: Self-pay

## 2020-04-24 ENCOUNTER — Ambulatory Visit (INDEPENDENT_AMBULATORY_CARE_PROVIDER_SITE_OTHER): Payer: Medicaid Other | Admitting: Urology

## 2020-04-24 ENCOUNTER — Other Ambulatory Visit: Payer: Self-pay | Admitting: Urology

## 2020-04-24 ENCOUNTER — Encounter: Payer: Self-pay | Admitting: Urology

## 2020-04-24 VITALS — BP 142/80 | HR 84 | Temp 98.6°F | Ht 73.0 in | Wt 150.0 lb

## 2020-04-24 DIAGNOSIS — N486 Induration penis plastica: Secondary | ICD-10-CM | POA: Diagnosis not present

## 2020-04-24 LAB — URINALYSIS, ROUTINE W REFLEX MICROSCOPIC
Bilirubin, UA: NEGATIVE
Glucose, UA: NEGATIVE
Ketones, UA: NEGATIVE
Leukocytes,UA: NEGATIVE
Nitrite, UA: NEGATIVE
Protein,UA: NEGATIVE
RBC, UA: NEGATIVE
Specific Gravity, UA: 1.015 (ref 1.005–1.030)
Urobilinogen, Ur: 0.2 mg/dL (ref 0.2–1.0)
pH, UA: 5 (ref 5.0–7.5)

## 2020-04-24 MED ORDER — PENTOXIFYLLINE ER 400 MG PO TBCR: 400.0000 mg | EXTENDED_RELEASE_TABLET | Freq: Two times a day (BID) | ORAL | 2 refills | Status: DC

## 2020-04-24 NOTE — Progress Notes (Signed)
Urological Symptom Review ° °Patient is experiencing the following symptoms: °None ° ° °Review of Systems ° °Gastrointestinal (upper)  : °Indigestion/heartburn ° °Gastrointestinal (lower) : °Negative for lower GI symptoms ° °Constitutional : °Negative for symptoms ° °Skin: °Negative for skin symptoms ° °Eyes: °Negative for eye symptoms ° °Ear/Nose/Throat : °Negative for Ear/Nose/Throat symptoms ° °Hematologic/Lymphatic: °Negative for Hematologic/Lymphatic symptoms ° °Cardiovascular : °Negative for cardiovascular symptoms ° °Respiratory : °Negative for respiratory symptoms ° °Endocrine: °Negative for endocrine symptoms ° °Musculoskeletal: °Back pain ° °Neurological: °Negative for neurological symptoms ° °Psychologic: °Negative for psychiatric symptoms °

## 2020-04-24 NOTE — Patient Instructions (Signed)
Collagenase injection (Dupuytren's Contracture/Peyronie's Disease) What is this medicine? COLLAGENASE (kohl LAH jen ace) is used to treat Dupuytren's contracture. This medicine may help straighten a bent finger by breaking up hard tissue. It is also used for Peyronie's disease by breaking up the hard tissue plaque that causes the curvature in the penis. This medicine may be used for other purposes; ask your health care provider or pharmacist if you have questions. COMMON BRAND NAME(S): Xiaflex What should I tell my health care provider before I take this medicine? They need to know if you have any of these conditions:  hemophilia  low platelet counts  take medicines that treat or prevent blood clots  an unusual or allergic reaction to collagenase, other medicines, foods, dyes, or preservatives  pregnant or trying to get pregnant  breast-feeding How should I use this medicine? This medicine is for injection into the hand or penis. It is given by a health care professional in a hospital or clinic setting. A special MedGuide will be given to you by the pharmacist with each prescription and refill. Be sure to read this information carefully each time. Talk to your pediatrician regarding the use of this medicine in children. Special care may be needed. Overdosage: If you think you have taken too much of this medicine contact a poison control center or emergency room at once. NOTE: This medicine is only for you. Do not share this medicine with others. What if I miss a dose? It is important not to miss your dose. Call your doctor or health care professional if you are unable to keep an appointment. What may interact with this medicine?  aspirin and aspirin-like medicines  certain medicines that treat or prevent blood clots like warfarin, enoxaparin, and dalteparin This list may not describe all possible interactions. Give your health care provider a list of all the medicines, herbs,  non-prescription drugs, or dietary supplements you use. Also tell them if you smoke, drink alcohol, or use illegal drugs. Some items may interact with your medicine. What should I watch for while using this medicine? Your condition will be monitored carefully while you are receiving this medicine. If being treated for Dupuytren's contracture, return to your healthcare provider the day after your hand is injected. In the meantime, do not flex or extend the fingers of your hand that was injected. Do not touch your finger that was injected, and elevate your hand until bedtime. Do not perform activity with the injected hand until you are told that it is OK. Follow any instructions about wearing a splint or performing finger exercises. Also, call your healthcare provider if you get increasing redness or swelling in the hand, if you have numbness or tingling in the treated finger, or if you have trouble bending the finger after the swelling goes down. If being treated for Peyronie's disease, you will need to return to your healthcare provider for a manual procedure that will stretch and help straighten your penis. Also, your healthcare provider will show you how to gently stretch your penis at home. Do not resume sexual activity until you are told that it is okay. Follow instructions on when to return for follow-up visits. Immediately call your doctor if you have trouble stretching or straightening your penis, or if you have pain or other concerns. Immediately call your healthcare provider if you get a fever or chills. What side effects may I notice from receiving this medicine? Side effects that you should report to your doctor or health   care professional as soon as possible:  allergic reactions like skin rash, itching or hives, swelling of the face, lips, or tongue  breathing problems  chest pain or palpitations  pain in your penis  pain when urinating  red or dark-brown urine  sudden loss of the  ability to maintain an erection  swelling of the injected hand  unusual swelling or bruising of the penis Side effects that usually do not require medical attention (report to your doctor or health care professional if they continue or are bothersome):  irritation at site where injected  pain at site where injected  unusual bleeding or bruising This list may not describe all possible side effects. Call your doctor for medical advice about side effects. You may report side effects to FDA at 1-800-FDA-1088. Where should I keep my medicine? This drug is given in a hospital or clinic and will not be stored at home. NOTE: This sheet is a summary. It may not cover all possible information. If you have questions about this medicine, talk to your doctor, pharmacist, or health care provider.  2020 Elsevier/Gold Standard (2015-08-09 09:34:13)  

## 2020-04-24 NOTE — Progress Notes (Signed)
04/24/2020 1:54 PM   Jeffery Gross Mar 25, 1961 [id number]  Referring provider: Dana Allan, MD [address] [place],  [place] [address]  Penile curvature  HPI: Mr Jeffery Gross is a 59yo here for evaluation of penile curvature. He first noticed the curvature 1 year ago. The curvature started to worsen in the last 2 months. He denies any pain with erections. He has been able to palpate a knot in his penis. He has left lateral curvature in the mid penile shaft. No issues with urination. No issues with ED.  His partner has pain during intercourse.  PMH: Past Medical History:  Diagnosis Date  . Back pain   . Chest pain   . DDD (degenerative disc disease), cervical   . Depression   . DVT (deep venous thrombosis) (HCC)   . GERD (gastroesophageal reflux disease)   . HTN (hypertension)   . Kidney stones     Surgical History: Past Surgical History:  Procedure Laterality Date  . AMPUTATION Bilateral 01/09/2016   Procedure: Bilateral Above Knee Amputation, Apply Wound VAC;  Surgeon: Nadara Mustard, MD;  [facility];  Service: Orthopedics;  Laterality: Bilateral;  . FASCIOTOMY Bilateral 01/03/2016   Procedure: FASCIOTOMY;  Surgeon: Tarry Kos, MD;  Location: [facility];  Service: Orthopedics;  Laterality: Bilateral;  . I & D EXTREMITY Bilateral 01/05/2016   Procedure: IRRIGATION AND DEBRIDEMENT BILATERAL LOWER EXTREMITIES; WOUND VAC CHANGE;  Surgeon: Tarry Kos, MD;  Location: [facility];  Service: Orthopedics;  Laterality: Bilateral;  . I & D EXTREMITY Left 01/07/2016   Procedure: Irrigation and Debridement of Left Lower Extremity with Application of Wound Vac;  Surgeon: Tarry Kos, MD;  [facility]: [facility];  Service: Orthopedics;  Laterality: Left;  Marland Kitchen VASECTOMY      Home Medications:  Allergies as of 04/24/2020      Reactions   Sulfacetamide Sodium Other (See Comments)   edema      Medication List       Accurate as of April 24, 2020  1:54 PM. If you have any questions, ask your nurse or  doctor.        cetirizine 10 MG tablet Commonly known as: ZYRTEC Take 1 tablet (10 mg total) by mouth daily. Reported on 02/08/2016   clonazePAM 0.5 MG tablet Commonly known as: KLONOPIN Take 0.5 mg by mouth 2 (two) times daily as needed for anxiety.   cyclobenzaprine 10 MG tablet Commonly known as: FLEXERIL Take 10 mg by mouth 3 (three) times daily as needed for muscle spasms.   DULoxetine 30 MG capsule Commonly known as: CYMBALTA Take 1 capsule (30 mg total) by mouth daily. Qam   fluticasone 50 MCG/ACT nasal spray Commonly known as: FLONASE Place 2 sprays into both nostrils daily.   montelukast 10 MG tablet Commonly known as: SINGULAIR Take 10 mg by mouth daily as needed. allergies   oxyCODONE 15 MG immediate release tablet Commonly known as: ROXICODONE   pantoprazole 40 MG tablet Commonly known as: PROTONIX Take 1 tablet (40 mg total) by mouth daily.   pregabalin 75 MG capsule Commonly known as: LYRICA Take 1 capsule (75 mg total) by mouth 2 (two) times daily.   risperiDONE 1 MG tablet Commonly known as: RISPERDAL Take 1 tablet (1 mg total) by mouth at bedtime.       Allergies:  Allergies  Allergen Reactions  . Sulfacetamide Sodium Other (See Comments)    edema    Family History: Family History  Problem Relation  Age of Onset  . Hypertension Father     Social History:  reports that he has been smoking cigars. He uses smokeless tobacco. He reports current alcohol use. No history on file for drug use.  ROS: All other review of systems were reviewed and are negative except what is noted above in HPI  Physical Exam: BP (!) 142/80   Pulse 84   Temp 98.6 F (37 C)   Ht 6\' 1"  (1.854 m)   Wt 150 lb (68 kg)   BMI 19.79 kg/m   Constitutional:  Alert and oriented, No acute distress. HEENT: [place] AT, moist mucus membranes.  Trachea midline, no masses. Cardiovascular: No clubbing, cyanosis, or edema. Respiratory: Normal respiratory effort, no increased  work of breathing. GI: Abdomen is soft, nontender, nondistended, no abdominal masses GU: No CVA tenderness. Circumcised phallus. No masses/lesions on penis, testis, scrotum. 2cm left corpora peyroneis plaque at the base extending to mid penile shaft.   Lymph: No cervical or inguinal lymphadenopathy. Skin: No rashes, bruises or suspicious lesions. Neurologic: Grossly intact, no focal deficits, moving all 4 extremities. Psychiatric: Normal mood and affect.  Laboratory Data: Lab Results  Component Value Date   WBC 8.2 02/08/2016   HGB 11.4 (L) 02/08/2016   HCT 33.9 (L) 02/08/2016   MCV 83.3 02/08/2016   PLT 228 02/08/2016    Lab Results  Component Value Date   CREATININE 2.19 (H) 01/30/2016    No results found for: PSA  No results found for: TESTOSTERONE  No results found for: HGBA1C  Urinalysis    Component Value Date/Time   COLORURINE YELLOW 01/24/2016 0754   APPEARANCEUR CLEAR 01/24/2016 0754   LABSPEC 1.015 01/24/2016 0754   PHURINE 5.5 01/24/2016 0754   GLUCOSEU NEGATIVE 01/24/2016 0754   HGBUR NEGATIVE 01/24/2016 0754   BILIRUBINUR NEGATIVE 01/24/2016 0754   KETONESUR NEGATIVE 01/24/2016 0754   PROTEINUR NEGATIVE 01/24/2016 0754   NITRITE NEGATIVE 01/24/2016 0754   LEUKOCYTESUR NEGATIVE 01/24/2016 0754    Lab Results  Component Value Date   BACTERIA NONE SEEN 01/14/2016    Pertinent Imaging:  Results for orders placed during the hospital encounter of 01/03/16  DG Abd 1 View  Narrative CLINICAL DATA:  Abdominal pain generalized.  EXAM: ABDOMEN - 1 VIEW  COMPARISON:  CT 01/03/2016  FINDINGS: Bowel gas pattern is nonobstructive. There is mild fecal retention throughout the colon. No free peritoneal air. No mass or mass effect. Mild degenerate change of the spine. Fusion hardware intact over the approximate L5-S1 level.  IMPRESSION: Nonobstructive bowel gas pattern.   Electronically Signed By: 01/05/2016 M.D. On: 01/10/2016 13:46  No  results found for this or any previous visit.  No results found for this or any previous visit.  No results found for this or any previous visit.  Results for orders placed during the hospital encounter of 01/03/16  01/05/16 Renal  Narrative CLINICAL DATA:  Urinary retention.  EXAM: RENAL / URINARY TRACT ULTRASOUND COMPLETE  COMPARISON:  CT abdomen and pelvis 01/03/2016  FINDINGS: Right Kidney:  Length: 12.2 cm. Echogenicity within normal limits. No mass or hydronephrosis visualized.  Left Kidney:  Length: 12.2 cm. Echogenicity within normal limits. No mass or hydronephrosis visualized.  Bladder:  Bladder is decompressed, resulting in limited evaluation.  IMPRESSION: Normal ultrasound appearance of the kidneys. Bladder is decompressed.   Electronically Signed By: 01/05/2016 M.D. On: 01/04/2016 02:06  No results found for this or any previous visit.  No results found for this or  any previous visit.  No results found for this or any previous visit.   Assessment & Plan:    1. Peyronie disease -We discussed penile plication, xiaflex therapy, and verapamil therapy.  -We will start trental BID for 90 days - Urinalysis, Routine w reflex microscopic   No follow-ups on file.  Wilkie Aye, MD  [facility] [place]

## 2020-07-31 ENCOUNTER — Ambulatory Visit: Payer: Medicaid Other | Admitting: Urology

## 2020-08-01 ENCOUNTER — Other Ambulatory Visit: Payer: Self-pay | Admitting: Urology

## 2020-10-29 ENCOUNTER — Telehealth: Payer: Self-pay | Admitting: Orthopedic Surgery

## 2020-10-29 NOTE — Telephone Encounter (Signed)
Patient called requesting a prescription for a new wheel chair. Patient states his wheel chair broke over the weekend and it is unsafe. Patient states to be an amputee and need as soon as possible. Patient phone number is [phone]. Please call patient about this matter

## 2020-10-30 NOTE — Telephone Encounter (Signed)
I called pt and advised that unfortunately I have to submit paperwork that shows he has had a face to face evaluation in the office with in the past 90 days the pt has not been in the office since 04/2019 I understand his situation and advised that he should call his pcp or anyone that has treated him this year to have this done. We are happy to see him in the office but the pt states that he is not able to get here. Advised if there is anything that I can do to please call the office and let me know.

## 2020-10-30 NOTE — Telephone Encounter (Signed)
I called pt to set appt and pt states his wheelchair is broken and he doesn't have a way to get here. Pt would like to know if there's any other way to go about getting the rx?  [phone]

## 2020-10-30 NOTE — Telephone Encounter (Signed)
Can you please call the pt ans make an appt last office visit was 04/2019 insurance will require a face to face visit in order to proceed with ordering wheelchair.

## 2020-12-10 ENCOUNTER — Telehealth: Payer: Self-pay | Admitting: Orthopedic Surgery

## 2020-12-10 NOTE — Telephone Encounter (Signed)
This would be absolutely ok. Whatever day and time this week he is able to come in no problem at all.

## 2020-12-10 NOTE — Telephone Encounter (Signed)
Pt fell and flipped wheelchair breaking it so it is no longer usable. Ins will not pay to cover a new prescription for one unless he sees a provider face to face. With Dr.Duda being out of the office, can we schedule  him with [facility] or Denny Peon in order to get this prescription filled? Pt states he can get a ride from a friend on Wednesday of this week. Pt call back number is [phone].

## 2020-12-12 ENCOUNTER — Ambulatory Visit: Payer: Self-pay | Admitting: Family

## 2020-12-27 ENCOUNTER — Other Ambulatory Visit: Payer: Self-pay | Admitting: Urology

## 2021-05-08 ENCOUNTER — Other Ambulatory Visit: Payer: Self-pay | Admitting: Urology

## 2022-06-13 ENCOUNTER — Observation Stay (HOSPITAL_COMMUNITY)
Admission: EM | Admit: 2022-06-13 | Discharge: 2022-06-14 | Disposition: A | Discharge status: Home / Self Care | Payer: Medicaid Other | Attending: Internal Medicine | Admitting: Internal Medicine

## 2022-06-13 ENCOUNTER — Other Ambulatory Visit: Payer: Self-pay

## 2022-06-13 ENCOUNTER — Emergency Department (HOSPITAL_COMMUNITY): Payer: Medicaid Other

## 2022-06-13 DIAGNOSIS — G894 Chronic pain syndrome: Secondary | ICD-10-CM | POA: Insufficient documentation

## 2022-06-13 DIAGNOSIS — N189 Chronic kidney disease, unspecified: Secondary | ICD-10-CM | POA: Insufficient documentation

## 2022-06-13 DIAGNOSIS — Z79899 Other long term (current) drug therapy: Secondary | ICD-10-CM | POA: Insufficient documentation

## 2022-06-13 DIAGNOSIS — Z720 Tobacco use: Secondary | ICD-10-CM | POA: Diagnosis present

## 2022-06-13 DIAGNOSIS — L089 Local infection of the skin and subcutaneous tissue, unspecified: Secondary | ICD-10-CM | POA: Diagnosis present

## 2022-06-13 DIAGNOSIS — Z8614 Personal history of Methicillin resistant Staphylococcus aureus infection: Secondary | ICD-10-CM | POA: Diagnosis not present

## 2022-06-13 DIAGNOSIS — F17290 Nicotine dependence, other tobacco product, uncomplicated: Secondary | ICD-10-CM | POA: Insufficient documentation

## 2022-06-13 DIAGNOSIS — L03115 Cellulitis of right lower limb: Secondary | ICD-10-CM | POA: Insufficient documentation

## 2022-06-13 DIAGNOSIS — Z7951 Long term (current) use of inhaled steroids: Secondary | ICD-10-CM | POA: Diagnosis not present

## 2022-06-13 DIAGNOSIS — J45909 Unspecified asthma, uncomplicated: Secondary | ICD-10-CM | POA: Insufficient documentation

## 2022-06-13 DIAGNOSIS — T8140XA Infection following a procedure, unspecified, initial encounter: Secondary | ICD-10-CM | POA: Diagnosis present

## 2022-06-13 DIAGNOSIS — Y792 Prosthetic and other implants, materials and accessory orthopedic devices associated with adverse incidents: Secondary | ICD-10-CM | POA: Diagnosis not present

## 2022-06-13 DIAGNOSIS — Z89611 Acquired absence of right leg above knee: Secondary | ICD-10-CM | POA: Diagnosis not present

## 2022-06-13 DIAGNOSIS — F32A Depression, unspecified: Secondary | ICD-10-CM | POA: Diagnosis present

## 2022-06-13 DIAGNOSIS — I129 Hypertensive chronic kidney disease with stage 1 through stage 4 chronic kidney disease, or unspecified chronic kidney disease: Secondary | ICD-10-CM | POA: Insufficient documentation

## 2022-06-13 DIAGNOSIS — Z86718 Personal history of other venous thrombosis and embolism: Secondary | ICD-10-CM | POA: Diagnosis not present

## 2022-06-13 DIAGNOSIS — J449 Chronic obstructive pulmonary disease, unspecified: Secondary | ICD-10-CM | POA: Diagnosis not present

## 2022-06-13 DIAGNOSIS — T148XXA Other injury of unspecified body region, initial encounter: Secondary | ICD-10-CM | POA: Diagnosis not present

## 2022-06-13 LAB — COMPREHENSIVE METABOLIC PANEL
ALT: 13 U/L (ref 0–44)
AST: 16 U/L (ref 15–41)
Albumin: 3.6 g/dL (ref 3.5–5.0)
Alkaline Phosphatase: 65 U/L (ref 38–126)
Anion gap: 8 (ref 5–15)
BUN: 14 mg/dL (ref 8–23)
CO2: 28 mmol/L (ref 22–32)
Calcium: 9.1 mg/dL (ref 8.9–10.3)
Chloride: 104 mmol/L (ref 98–111)
Creatinine, Ser: 1.03 mg/dL (ref 0.61–1.24)
GFR, Estimated: >60 mL/min (ref >60)
Glucose, Bld: 108 mg/dL — ABNORMAL HIGH (ref 70–99)
Potassium: 3.8 mmol/L (ref 3.5–5.1)
Sodium: 140 mmol/L (ref 135–145)
Total Bilirubin: 0.4 mg/dL (ref 0.3–1.2)
Total Protein: 7.2 g/dL (ref 6.5–8.1)

## 2022-06-13 LAB — CBC WITH DIFFERENTIAL/PLATELET
Abs Immature Granulocytes: 0.05 10*3/uL (ref 0.00–0.07)
Basophils Absolute: 0 10*3/uL (ref 0.0–0.1)
Basophils Relative: 0 %
Eosinophils Absolute: 0.2 10*3/uL (ref 0.0–0.5)
Eosinophils Relative: 2 %
HCT: 44.9 % (ref 39.0–52.0)
Hemoglobin: 14.5 g/dL (ref 13.0–17.0)
Immature Granulocytes: 0 %
Lymphocytes Relative: 19 %
Lymphs Abs: 2.2 10*3/uL (ref 0.7–4.0)
MCH: 27.8 pg (ref 26.0–34.0)
MCHC: 32.3 g/dL (ref 30.0–36.0)
MCV: 86 fL (ref 80.0–100.0)
Monocytes Absolute: 0.7 10*3/uL (ref 0.1–1.0)
Monocytes Relative: 6 %
Neutro Abs: 8.6 10*3/uL — ABNORMAL HIGH (ref 1.7–7.7)
Neutrophils Relative %: 73 %
Platelets: 322 10*3/uL (ref 150–400)
RBC: 5.22 MIL/uL (ref 4.22–5.81)
RDW: 13 % (ref 11.5–15.5)
WBC: 11.8 10*3/uL — ABNORMAL HIGH (ref 4.0–10.5)
nRBC: 0 % (ref 0.0–0.2)

## 2022-06-13 LAB — LACTIC ACID, PLASMA
Lactic Acid, Venous: 1.3 mmol/L (ref 0.5–1.9)
Lactic Acid, Venous: 1 mmol/L (ref 0.5–1.9)

## 2022-06-13 MED ORDER — MORPHINE SULFATE (PF) 4 MG/ML IV SOLN
4.0000 mg | Freq: Once | INTRAVENOUS | Status: AC
  Administered 2022-06-13: 4 mg via INTRAVENOUS
  Filled 2022-06-13: qty 1

## 2022-06-13 MED ORDER — GABAPENTIN 300 MG PO CAPS
300.0000 mg | ORAL_CAPSULE | Freq: Three times a day (TID) | ORAL | Status: DC
  Administered 2022-06-13 – 2022-06-14 (×2): 300 mg via ORAL
  Filled 2022-06-13 (×2): qty 1

## 2022-06-13 MED ORDER — DOXYCYCLINE HYCLATE 100 MG PO TABS
100.0000 mg | ORAL_TABLET | Freq: Two times a day (BID) | ORAL | Status: DC
  Administered 2022-06-14: 100 mg via ORAL
  Filled 2022-06-13: qty 1

## 2022-06-13 MED ORDER — VITAMIN D (ERGOCALCIFEROL) 1.25 MG (50000 UNIT) PO CAPS
50000.0000 [IU] | ORAL_CAPSULE | ORAL | Status: DC
  Administered 2022-06-13: 50000 [IU] via ORAL
  Filled 2022-06-13: qty 1

## 2022-06-13 MED ORDER — VANCOMYCIN HCL IN DEXTROSE 1-5 GM/200ML-% IV SOLN
1000.0000 mg | Freq: Once | INTRAVENOUS | Status: AC
  Administered 2022-06-13: 1000 mg via INTRAVENOUS
  Filled 2022-06-13: qty 200

## 2022-06-13 MED ORDER — DULOXETINE HCL 60 MG PO CPEP
60.0000 mg | ORAL_CAPSULE | Freq: Every day | ORAL | Status: DC
  Administered 2022-06-14: 60 mg via ORAL
  Filled 2022-06-13: qty 1

## 2022-06-13 MED ORDER — PANTOPRAZOLE SODIUM 40 MG PO TBEC
40.0000 mg | DELAYED_RELEASE_TABLET | Freq: Every day | ORAL | Status: DC
  Administered 2022-06-14: 40 mg via ORAL
  Filled 2022-06-13: qty 1

## 2022-06-13 MED ORDER — ENOXAPARIN SODIUM 40 MG/0.4ML IJ SOSY
40.0000 mg | PREFILLED_SYRINGE | INTRAMUSCULAR | Status: DC
  Administered 2022-06-13: 40 mg via SUBCUTANEOUS
  Filled 2022-06-13: qty 0.4

## 2022-06-13 MED ORDER — SODIUM CHLORIDE 0.9 % IV SOLN
1.0000 g | INTRAVENOUS | Status: DC
  Administered 2022-06-13: 1 g via INTRAVENOUS
  Filled 2022-06-13: qty 10

## 2022-06-13 MED ORDER — OXYCODONE HCL 5 MG PO TABS
20.0000 mg | ORAL_TABLET | Freq: Every day | ORAL | Status: DC | PRN
  Administered 2022-06-14: 20 mg via ORAL
  Filled 2022-06-13: qty 4

## 2022-06-13 NOTE — ED Triage Notes (Signed)
Pt c/o increasing infection in R AKA x1 week.  Redness noted.  Pt reports site was oozing, but is currently scabbed over.

## 2022-06-13 NOTE — ED Provider Notes (Signed)
[facility] EMERGENCY DEPARTMENT Provider Note   CSN: [id number] Arrival date & time: 06/13/22  1551     History  Chief Complaint  Patient presents with   Wound Check    Jeffery Gross is a 61 y.o. 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  Patient states that the stump of his right leg has been more red and looking infected for about a week.  He states that the site was oozing, but is currently scabbed over.  Denies any fevers.  Has increasing pain in the area.   Wound Check       Home Medications Prior to Admission medications   Medication Sig Start Date End Date Taking? Authorizing Provider  DULoxetine (CYMBALTA) 60 MG capsule Take 60 mg by mouth daily.   Yes [provider]  fluticasone (FLONASE) 50 MCG/ACT nasal spray Place 2 sprays into both nostrils daily. 03/20/16  Yes Howard Pouch, MD  gabapentin (NEURONTIN) 300 MG capsule Take 300 mg by mouth 3 (three) times daily. 05/16/22  Yes [provider]  omeprazole (PRILOSEC) 20 MG capsule Take 20 mg by mouth daily. 03/19/22  Yes [provider]  Oxycodone HCl 20 MG TABS Take 1 tablet by mouth 5 (five) times daily. 06/06/22  Yes [provider]  Vitamin D, Ergocalciferol, (DRISDOL) 1.25 MG (50000 UNIT) CAPS capsule Take 50,000 Units by mouth once a week. 04/30/22  Yes [provider]  montelukast (SINGULAIR) 10 MG tablet Take 10 mg by mouth daily as needed. allergies 12/25/15   [provider]      Allergies    Sulfacetamide sodium    Review of Systems   Review of Systems  Constitutional:  Negative for fever.  Skin:  Positive for rash and wound.  All other systems reviewed and are negative.   Physical Exam Updated Vital Signs BP (!) 130/96 (BP Location: Left Arm)   Pulse (!) 103   Temp 99.8 F (37.7 C) (Oral)    Resp 16   Wt 68.5 kg   SpO2 98%   BMI 19.92 kg/m  Physical Exam Vitals and nursing note reviewed.  Constitutional:      Appearance: Normal appearance.  HENT:     Head: Normocephalic and atraumatic.  Eyes:     Conjunctiva/sclera: Conjunctivae normal.  Pulmonary:     Effort: Pulmonary effort is normal. No respiratory distress.  Skin:    General: Skin is warm and dry.     Comments: Erythema and edema noted to right AKA stump, as imaged below. Fluctuant with increased warmth.   Neurological:     Mental Status: He is alert.  Psychiatric:        Mood and Affect: Mood normal.        Behavior: Behavior normal.      ED Results / Procedures / Treatments   Labs (all labs ordered are listed, but only abnormal results are displayed) Labs Reviewed  COMPREHENSIVE METABOLIC PANEL - Abnormal; Notable for the following components:      Result Value   Glucose, Bld 108 (*)    All other components within normal limits  CBC WITH DIFFERENTIAL/PLATELET - Abnormal; Notable for the following components:   WBC 11.8 (*)    Neutro Abs 8.6 (*)    All other components within normal limits  LACTIC ACID, PLASMA  LACTIC ACID, PLASMA    EKG None  Radiology DG Femur Min 2 Views Right  Result Date: 06/13/2022 CLINICAL DATA:  Possible osteo EXAM: RIGHT FEMUR 2 VIEWS COMPARISON:  None Available. FINDINGS: Prior right above the knee amputation. No cortical irregularity is seen at the osteotomy site. No acute fracture or dislocation. IMPRESSION: Prior above the knee amputation. No radiographic evidence of osteomyelitis. Electronically Signed   By: Allegra Lai M.D.   On: 06/13/2022 20:12    Procedures Procedures    Medications Ordered in ED Medications  vancomycin (VANCOCIN) IVPB 1000 mg/200 mL premix (has no administration in time range)  morphine (PF) 4 MG/ML injection 4 mg (has no administration in time range)    ED Course/ Medical Decision Making/ A&P                           Medical  Decision Making Amount and/or Complexity of Data Reviewed Labs: ordered. Radiology: ordered.  Risk Prescription drug management. Decision regarding hospitalization.   This patient is a 61 y.o. male  who presents to the ED for concern of possible AKA stump infection.   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 Pertinent results include: Bilateral AKA performed in 2017 by Dr Lajoyce Corners  Physical Exam: Physical exam performed. The pertinent findings include: Tachycardic, but afebrile. Erythema and fluctuance of right AKA stump  Lab Tests/Imaging studies: I personally interpreted labs/imaging and the pertinent results include:  leukocytosis of 11.8, CMP unremarkable, normal lactic acid. X-ray of femur without findings of osteomyelitis. I agree with the radiologist interpretation.  Medications: I ordered medication including vancomycin and pain medication.  I have reviewed the patients home medicines and have made adjustments as needed.  Consultations obtained: I consulted with hospitalist Dr Randol Kern who will admit.  Hospitalist and myself consulted with on-call orthopedic surgeon Dr Dallas Schimke who will see the patient tomorrow and determine need for surgical intervention/debridement.    Disposition: After consideration of the diagnostic results and the patients response to treatment, I feel that patient would benefit from medical admission for IV antibiotics and possible surgical debridement of right AKA stump infection.  I discussed this case with my attending physician Dr. Hyacinth Meeker who cosigned this note including patient's presenting symptoms, physical exam, and planned diagnostics and interventions. Attending physician stated agreement with plan or made changes to plan which were implemented.   Final Clinical Impression(s) / ED Diagnoses Final diagnoses:   Cellulitis of right lower extremity  History of MRSA infection  S/P AKA (above knee amputation) bilateral (HCC)    Rx / DC Orders ED Discharge Orders     None      Portions of this report may have been transcribed using voice recognition software. Every effort was made to ensure accuracy; however, inadvertent computerized transcription errors may be present.    Jeanella Flattery 06/13/22 2148    Eber Hong, MD 06/14/22 1040

## 2022-06-13 NOTE — ED Notes (Signed)
Pt returned from X Ray.

## 2022-06-13 NOTE — ED Notes (Signed)
Patient transported to X-ray 

## 2022-06-13 NOTE — H&P (Signed)
[facility] H&P   Patient Demographics:    Jeffery Gross, is a 61 y.o. male  MRN: [id number]   DOB - August 03, 1960  Admit Date - 06/13/2022  Outpatient Primary MD for the patient is Salli Real, MD  Referring MD/NP/PA: PA Roemhildt  Outpatient Specialists: Dr Lajoyce Corners  Patient coming from: home  Chief Complaint  Patient presents with   Wound Check      HPI:    Jeffery Gross  is a 61 y.o. 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 -In ED he was afebrile, lactic acid 1.3, white blood cell count 11.8, UA with no evidence of acute osteomyelitis, patient received IV vancomycin and [facility] hospitalist consulted to admit    Review of systems:      A full 10 point Review of Systems was done, except as stated above, all other Review of Systems were negative.   With Past History of the following :    Past Medical History:  Diagnosis Date   Back pain    Chest pain    DDD (degenerative disc disease), cervical    Depression    DVT (deep venous thrombosis) (HCC)    GERD (gastroesophageal reflux disease)    HTN (hypertension)    Kidney stones       Past Surgical History:  Procedure Laterality Date   AMPUTATION Bilateral 01/09/2016   Procedure: Bilateral Above Knee Amputation, Apply Wound VAC;  Surgeon: Nadara Mustard, MD;  Location: [facility];  Service: Orthopedics;  Laterality: Bilateral;   FASCIOTOMY Bilateral 01/03/2016    Procedure: FASCIOTOMY;  Surgeon: Tarry Kos, MD;  Location: [facility] OR;  Service: Orthopedics;  Laterality: Bilateral;   I & D EXTREMITY Bilateral 01/05/2016   Procedure: IRRIGATION AND DEBRIDEMENT BILATERAL LOWER EXTREMITIES; WOUND VAC CHANGE;  Surgeon: Tarry Kos, MD;  Location: [facility];  Service: Orthopedics;  Laterality: Bilateral;   I & D EXTREMITY Left 01/07/2016   Procedure: Irrigation and Debridement of Left Lower Extremity with Application of Wound Vac;  Surgeon: Tarry Kos, MD;  Location: [facility];  Service: Orthopedics;  Laterality: Left;   VASECTOMY        Social History:     Social History   Tobacco Use   Smoking status: Some Days    Types: Cigars   Smokeless tobacco: Current  Substance Use Topics   Alcohol use: Yes    Alcohol/week: 0.0 standard drinks of alcohol    Comment: 12 pack a month  Family History :     Family History  Problem Relation Age of Onset   Hypertension Father      Home Medications:   Prior to Admission medications   Medication Sig Start Date End Date Taking? Authorizing Provider  DULoxetine (CYMBALTA) 60 MG capsule Take 60 mg by mouth daily.   Yes [provider]  fluticasone (FLONASE) 50 MCG/ACT nasal spray Place 2 sprays into both nostrils daily. 03/20/16  Yes Howard Pouch, MD  gabapentin (NEURONTIN) 300 MG capsule Take 300 mg by mouth 3 (three) times daily. 05/16/22  Yes [provider]  omeprazole (PRILOSEC) 20 MG capsule Take 20 mg by mouth daily. 03/19/22  Yes [provider]  Oxycodone HCl 20 MG TABS Take 1 tablet by mouth 5 (five) times daily. 06/06/22  Yes [provider]  Vitamin D, Ergocalciferol, (DRISDOL) 1.25 MG (50000 UNIT) CAPS capsule Take 50,000 Units by mouth once a week. 04/30/22  Yes [provider]  montelukast (SINGULAIR) 10 MG tablet Take 10 mg by mouth daily as needed. allergies 12/25/15   [provider]     Allergies:     Allergies  Allergen Reactions    Sulfacetamide Sodium Other (See Comments)    edema     Physical Exam:   Vitals  Blood pressure (!) 130/96, pulse (!) 103, temperature 99.8 F (37.7 C), temperature source Oral, resp. rate 16, weight 68.5 kg, SpO2 98 %.   1. General developed male, laying in bed, no apparent distress  2. Normal affect and insight, Not Suicidal or Homicidal, Awake Alert, Oriented X 3.  3. No F.N deficits, ALL C.Nerves Intact, Strength 5/5 all 4 extremities, Sensation intact all 4 extremities, Plantars down going.  4. Ears and Eyes appear Normal, Conjunctivae clear, PERRLA. Moist Oral Mucosa.  5. Supple Neck, No JVD, No cervical lymphadenopathy appriciated, No Carotid Bruits.  6. Symmetrical Chest wall movement, Good air movement bilaterally, CTAB.  7. RRR, No Gallops, Rubs or Murmurs, No Parasternal Heave.  8. Positive Bowel Sounds, Abdomen Soft, No tenderness, No organomegaly appriciated,No rebound -guarding or rigidity.  9.  Bilateral AKA, right stump irritation with scab formation, please see picture below    CBC Recent Labs  Lab 06/13/22 1736  WBC 11.8*  HGB 14.5  HCT 44.9  PLT 322  MCV 86.0  MCH 27.8  MCHC 32.3  RDW 13.0  LYMPHSABS 2.2  MONOABS 0.7  EOSABS 0.2  BASOSABS 0.0   ------------------------------------------------------------------------------------------------------------------  Chemistries  Recent Labs  Lab 06/13/22 1736  NA 140  K 3.8  CL 104  CO2 28  GLUCOSE 108*  BUN 14  CREATININE 1.03  CALCIUM 9.1  AST 16  ALT 13  ALKPHOS 65  BILITOT 0.4   ------------------------------------------------------------------------------------------------------------------ CrCl cannot be calculated (Unknown ideal weight.). ------------------------------------------------------------------------------------------------------------------ No results for input(s): "TSH", "T4TOTAL", "T3FREE", "THYROIDAB" in the last 72 hours.  Invalid input(s): "FREET3"  Coagulation  profile No results for input(s): "INR", "PROTIME" in the last 168 hours. ------------------------------------------------------------------------------------------------------------------- No results for input(s): "DDIMER" in the last 72 hours. -------------------------------------------------------------------------------------------------------------------  Cardiac Enzymes No results for input(s): "CKMB", "TROPONINI", "MYOGLOBIN" in the last 168 hours.  Invalid input(s): "CK" ------------------------------------------------------------------------------------------------------------------ No results found for: "BNP"   ---------------------------------------------------------------------------------------------------------------  Urinalysis    Component Value Date/Time   COLORURINE YELLOW 01/24/2016 0754   APPEARANCEUR Clear 04/24/2020 1327   LABSPEC 1.015 01/24/2016 0754   PHURINE 5.5 01/24/2016 0754   GLUCOSEU Negative 04/24/2020 1327   HGBUR NEGATIVE 01/24/2016 0754   BILIRUBINUR Negative 04/24/2020 1327  KETONESUR NEGATIVE 01/24/2016 0754   PROTEINUR Negative 04/24/2020 1327   PROTEINUR NEGATIVE 01/24/2016 0754   NITRITE Negative 04/24/2020 1327   NITRITE NEGATIVE 01/24/2016 0754   LEUKOCYTESUR Negative 04/24/2020 1327    ----------------------------------------------------------------------------------------------------------------   Imaging Results:    DG Femur Min 2 Views Right  Result Date: 06/13/2022 CLINICAL DATA:  Possible osteo EXAM: RIGHT FEMUR 2 VIEWS COMPARISON:  None Available. FINDINGS: Prior right above the knee amputation. No cortical irregularity is seen at the osteotomy site. No acute fracture or dislocation. IMPRESSION: Prior above the knee amputation. No radiographic evidence of osteomyelitis. Electronically Signed   By: Allegra Lai M.D.   On: 06/13/2022 20:12       Assessment & Plan:    Principal Problem:   Infected wound Active  Problems:   Tobacco abuse   Chronic pain syndrome   Depression   Right AKA stump site cellulitis -Patient presents with right infection, erythema, scab formation, reported this is after he had scratch couple weeks ago -He is afebrile, only mild leukocytosis, x-ray with no evidence of osteomyelitis. -He will be started on Rocephin, doxycycline, (received 1 dose of IV vancomycin in ED). -Discussed with orthopedic Dr. Loraine Leriche, who will assess patient tomorrow, plan likely will be antibiotics, and local wound care, will await recommendations.  chronic pain syndrome Neuropathic pain -Continue with Cymbalta, gabapentin and home dose oxycodone  Tobacco  abuse - he was counseled  DVT Prophylaxis  Lovenox   AM Labs Ordered, also please review Full Orders  Family Communication: Admission, patients condition and plan of care including tests being ordered have been discussed with the patient  who indicate understanding and agree with the plan and Code Status.  Code Status full  Likely DC to  home  Condition GUARDED    Consults called: ortho    Admission status: observation    Time spent in minutes : 55 minutes   Huey Bienenstock M.D on 06/13/2022 at 9:55 PM   [facility] - Office  [phone]

## 2022-06-14 DIAGNOSIS — Z89611 Acquired absence of right leg above knee: Secondary | ICD-10-CM

## 2022-06-14 DIAGNOSIS — L03115 Cellulitis of right lower limb: Secondary | ICD-10-CM

## 2022-06-14 DIAGNOSIS — F32A Depression, unspecified: Secondary | ICD-10-CM | POA: Diagnosis not present

## 2022-06-14 DIAGNOSIS — L089 Local infection of the skin and subcutaneous tissue, unspecified: Secondary | ICD-10-CM

## 2022-06-14 DIAGNOSIS — T148XXA Other injury of unspecified body region, initial encounter: Secondary | ICD-10-CM | POA: Diagnosis not present

## 2022-06-14 DIAGNOSIS — Z720 Tobacco use: Secondary | ICD-10-CM

## 2022-06-14 DIAGNOSIS — G894 Chronic pain syndrome: Secondary | ICD-10-CM

## 2022-06-14 DIAGNOSIS — Z89612 Acquired absence of left leg above knee: Secondary | ICD-10-CM

## 2022-06-14 DIAGNOSIS — Z8614 Personal history of Methicillin resistant Staphylococcus aureus infection: Secondary | ICD-10-CM

## 2022-06-14 LAB — BASIC METABOLIC PANEL
Anion gap: 10 (ref 5–15)
BUN: 20 mg/dL (ref 8–23)
CO2: 26 mmol/L (ref 22–32)
Calcium: 8.8 mg/dL — ABNORMAL LOW (ref 8.9–10.3)
Chloride: 106 mmol/L (ref 98–111)
Creatinine, Ser: 1.03 mg/dL (ref 0.61–1.24)
GFR, Estimated: >60 mL/min (ref >60)
Glucose, Bld: 117 mg/dL — ABNORMAL HIGH (ref 70–99)
Potassium: 3.7 mmol/L (ref 3.5–5.1)
Sodium: 142 mmol/L (ref 135–145)

## 2022-06-14 LAB — HIV ANTIBODY (ROUTINE TESTING W REFLEX): HIV Screen 4th Generation wRfx: NONREACTIVE

## 2022-06-14 LAB — CBC
HCT: 42.1 % (ref 39.0–52.0)
Hemoglobin: 13.9 g/dL (ref 13.0–17.0)
MCH: 28.4 pg (ref 26.0–34.0)
MCHC: 33 g/dL (ref 30.0–36.0)
MCV: 85.9 fL (ref 80.0–100.0)
Platelets: 313 10*3/uL (ref 150–400)
RBC: 4.9 MIL/uL (ref 4.22–5.81)
RDW: 13.2 % (ref 11.5–15.5)
WBC: 10.2 10*3/uL (ref 4.0–10.5)
nRBC: 0 % (ref 0.0–0.2)

## 2022-06-14 LAB — PREALBUMIN: Prealbumin: 18 mg/dL (ref 18–38)

## 2022-06-14 LAB — C-REACTIVE PROTEIN: CRP: 4.3 mg/dL — ABNORMAL HIGH (ref <1.0)

## 2022-06-14 LAB — SEDIMENTATION RATE: Sed Rate: 43 mm/hr — ABNORMAL HIGH (ref 0–16)

## 2022-06-14 MED ORDER — CEPHALEXIN 500 MG PO CAPS: 500.0000 mg | ORAL_CAPSULE | Freq: Two times a day (BID) | ORAL | 0 refills | Status: AC

## 2022-06-14 MED ORDER — NICOTINE 21 MG/24HR TD PT24: 21.0000 mg | MEDICATED_PATCH | Freq: Every day | TRANSDERMAL | 0 refills | Status: DC

## 2022-06-14 MED ORDER — DOXYCYCLINE HYCLATE 100 MG PO TABS: 100.0000 mg | ORAL_TABLET | Freq: Two times a day (BID) | ORAL | 0 refills | Status: AC

## 2022-06-14 MED ORDER — NICOTINE 21 MG/24HR TD PT24
21.0000 mg | MEDICATED_PATCH | Freq: Every day | TRANSDERMAL | Status: DC
  Administered 2022-06-14: 21 mg via TRANSDERMAL
  Filled 2022-06-14: qty 1

## 2022-06-14 NOTE — Progress Notes (Signed)
Nsg Discharge Note  Admit Date:  06/13/2022 Discharge date: 06/14/2022   Rip Harbour to be D/C'd Home per MD order.  AVS completed.   Patient/caregiver able to verbalize understanding.  Discharge Medication: Allergies as of 06/14/2022       Reactions   Sulfacetamide Sodium Other (See Comments)   edema        Medication List     TAKE these medications    cephALEXin 500 MG capsule Commonly known as: KEFLEX Take 1 capsule (500 mg total) by mouth 2 (two) times daily for 10 days.   doxycycline 100 MG tablet Commonly known as: VIBRA-TABS Take 1 tablet (100 mg total) by mouth every 12 (twelve) hours for 10 days.   DULoxetine 60 MG capsule Commonly known as: CYMBALTA Take 60 mg by mouth daily.   fluticasone 50 MCG/ACT nasal spray Commonly known as: FLONASE Place 2 sprays into both nostrils daily.   gabapentin 300 MG capsule Commonly known as: NEURONTIN Take 300 mg by mouth 3 (three) times daily.   montelukast 10 MG tablet Commonly known as: SINGULAIR Take 10 mg by mouth daily as needed. allergies   nicotine 21 mg/24hr patch Commonly known as: NICODERM CQ - dosed in mg/24 hours Place 1 patch (21 mg total) onto the skin daily. Start taking on: June 15, 2022   omeprazole 20 MG capsule Commonly known as: PRILOSEC Take 20 mg by mouth daily.   Oxycodone HCl 20 MG Tabs Take 1 tablet by mouth 5 (five) times daily.   Vitamin D (Ergocalciferol) 1.25 MG (50000 UNIT) Caps capsule Commonly known as: DRISDOL Take 50,000 Units by mouth once a week.        Discharge Assessment: Vitals:   06/13/22 2252 06/14/22 0337  BP: (!) 158/83 (!) 112/96  Pulse: 88 82  Resp: 18 16  Temp: 97.6 F (36.4 C) (!) 96.9 F (36.1 C)  SpO2: 100% 98%   Skin clean, dry and intact without evidence of skin break down, no evidence of skin tears noted. IV catheter discontinued intact. Site without signs and symptoms of complications - no redness or edema noted at insertion site,  patient denies c/o pain - only slight tenderness at site.  Dressing with slight pressure applied.  D/c Instructions-Education: Discharge instructions given to patient/family with verbalized understanding. D/c education completed with patient/family including follow up instructions, medication list, d/c activities limitations if indicated, with other d/c instructions as indicated by MD - patient able to verbalize understanding, all questions fully answered. Patient instructed to return to ED, call 911, or call MD for any changes in condition.  Patient escorted via WC, and D/C home via private auto.  Verl Dicker, RN 06/14/2022 1:48 PM

## 2022-06-14 NOTE — Discharge Summary (Signed)
Physician Discharge Summary   Patient: Jeffery Gross MRN: [id number] DOB: [birth year]  Admit date:     06/13/2022  Discharge date: 06/14/22  Discharge Physician: Barton Dubois   PCP: Sandi Mariscal, MD   Recommendations at discharge:  Repeat basic metabolic panel to follow electrolytes and renal function Continue assisting patient with tobacco cessation. Reassess right AKA stump cellulitic healing process and make sure patient follow-up with orthopedic service as instructed.  Discharge Diagnoses: Principal Problem:   Infected wound Active Problems:   S/P AKA (above knee amputation) bilateral (HCC)   Tobacco abuse   Chronic pain syndrome   Depression   History of MRSA infection   Cellulitis of right lower extremity  Hospital Course:  Jeffery Gross  is a 61 y.o. 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 -In ED he was afebrile, lactic acid 1.3, white blood cell count 11.8, UA with no evidence of acute osteomyelitis, patient received IV vancomycin and [facility] hospitalist consulted to place in the hospital for further eval/management. admit  Assessment and Plan: 1-right AKA stump cellulitis -Hemodynamically stable, WBCs improved from within normal limits at discharge -CRP 4.3 -ESR 43 -No signs of systemic infection; after evaluation by orthopedic service here this initial for treatment with oral antibiotics and outpatient follow-up with Dr. Sharol Given decided. -Patient in agreement with plan -Continue as needed  analgesics.  2-tobacco abuse -Cessation counseling provided -Nicotine patch prescribed.  3-chronic pain/neuropathic pain -Continue the use of Cymbalta, gabapentin and low-dose oxycodone.  4-leukocytosis -In the setting of cellulitis -Resolved with fluids and initiation of antibiotic -Patient hemodynamically stable and ready to go home -Continue to follow WBCs trend.  5-history of COPD/asthma -Continue the use of Singulair and fluticasone -No acute exacerbation no wheezing appreciated.  6-GERD -Continue PPI.  Consultants: Orthopedic service Procedures performed: See below for x-ray report. Disposition: Home Diet recommendation: Heart healthy diet.  DISCHARGE MEDICATION: Allergies as of 06/14/2022       Reactions   Sulfacetamide Sodium Other (See Comments)   edema        Medication List     TAKE these medications    cephALEXin 500 MG capsule Commonly known as: KEFLEX Take 1 capsule (500 mg total) by mouth 2 (two) times daily for 10 days.   doxycycline 100 MG tablet Commonly known as: VIBRA-TABS Take 1 tablet (100 mg total) by mouth every 12 (twelve) hours for 10 days.   DULoxetine 60 MG capsule Commonly known as: CYMBALTA Take 60 mg by mouth daily.   fluticasone 50 MCG/ACT nasal spray Commonly known as: FLONASE Place 2 sprays into both nostrils daily.   gabapentin 300 MG capsule Commonly known as: NEURONTIN Take 300 mg by mouth 3 (three) times daily.   montelukast 10 MG tablet Commonly known as: SINGULAIR Take 10 mg by mouth daily as needed. allergies   nicotine 21 mg/24hr patch Commonly known as: NICODERM CQ - dosed in mg/24 hours Place 1 patch (21 mg total) onto the skin daily. Start taking on: June 15, 2022   omeprazole 20 MG capsule Commonly known as: PRILOSEC Take 20  mg by mouth daily.   Oxycodone HCl 20 MG Tabs Take 1 tablet by mouth 5 (five) times daily.   Vitamin D (Ergocalciferol) 1.25 MG (50000 UNIT) Caps capsule Commonly  known as: DRISDOL Take 50,000 Units by mouth once a week.        Follow-up Information     Sandi Mariscal, MD. Schedule an appointment as soon as possible for a visit in 10 day(s).   Specialty: Internal Medicine Contact information: [place] [address] [phone]         Newt Minion, MD Follow up today.   Specialty: Orthopedic Surgery Why: Contact office for appointment details. Contact information: [place] [address] [phone]                Discharge Exam: Jeffery Gross Weights   06/13/22 1714 06/13/22 2252  Weight: 68.5 kg 71.1 kg   General exam: Alert, awake, oriented x 3; no fever, no chest pain, no shortness of breath. Respiratory system: Clear to auscultation. Respiratory effort normal. Cardiovascular system:RRR. No murmurs, rubs, gallops. Gastrointestinal system: Abdomen is nondistended, soft and nontender. No organomegaly or masses felt. Normal bowel sounds heard. Central nervous system: Alert and oriented. No focal neurological deficits. Extremities: No bilateral AKA. Skin: No cellulitic process on his right AKA stump improving and without active drainage at time of discharge. Psychiatry: Judgement and insight appear normal. Mood & affect appropriate.    Condition at discharge: Stable and improved.  The results of significant diagnostics from this hospitalization (including imaging, microbiology, ancillary and laboratory) are listed below for reference.   Imaging Studies: DG Femur Min 2 Views Right  Result Date: 06/13/2022 CLINICAL DATA:  Possible osteo EXAM: RIGHT FEMUR 2 VIEWS COMPARISON:  None Available. FINDINGS: Prior right above the knee amputation. No cortical irregularity is seen at the osteotomy site. No acute fracture or dislocation. IMPRESSION: Prior above the knee amputation. No radiographic evidence of osteomyelitis. Electronically Signed   By: Yetta Glassman M.D.   On: 06/13/2022 20:12     Microbiology: Results for orders placed or performed during the hospital encounter of 01/23/16  Culture, Urine     Status: None   Collection Time: 01/24/16  7:53 AM   Specimen: Urine, Random  Result Value Ref Range Status   Specimen Description URINE, RANDOM  Final   Special Requests NONE  Final   Culture NO GROWTH  Final   Report Status 01/25/2016 FINAL  Final    Labs: CBC: Recent Labs  Lab 06/13/22 1736 06/14/22 0349  WBC 11.8* 10.2  NEUTROABS 8.6*  --   HGB 14.5 13.9  HCT 44.9 42.1  MCV 86.0 85.9  PLT 322 920   Basic Metabolic Panel: Recent Labs  Lab 06/13/22 1736 06/14/22 0349  NA 140 142  K 3.8 3.7  CL 104 106  CO2 28 26  GLUCOSE 108* 117*  BUN 14 20  CREATININE 1.03 1.03  CALCIUM 9.1 8.8*   Liver Function Tests: Recent Labs  Lab 06/13/22 1736  AST 16  ALT 13  ALKPHOS 65  BILITOT 0.4  PROT 7.2  ALBUMIN 3.6   CBG: No results for input(s): "GLUCAP" in the last 168 hours.  Discharge time spent: greater than 30 minutes.  Signed: Barton Dubois, MD [facility] 06/14/2022

## 2022-06-14 NOTE — Consult Note (Signed)
ORTHOPAEDIC CONSULTATION  REQUESTING PHYSICIAN: Barton Dubois, MD  ASSESSMENT AND PLAN: 61 y.o. male with the following: Ulceration of right AKA stump, likely underlying infection.  Possibility of osteomyelitis, although it is not readily apparent on x-ray.   Orthopedics recommends admission to a medical service and we will provide consultation and follow along.  Mr. Woolbright is status post right AKA approximately 6 years ago.  He has done well, continues to function at a high level.  He states over the past 1-2 weeks, he started develop some redness and a clear draining wound from his right stump.  This could be related to poorly fitting prostheses.  Continue with antibiotics.  Consider ID consult.  As long as he remains stable, no urgent surgical intervention is warranted.  Ideally, he is evaluated by Dr. Sharol Given within the next 1-2 weeks for further discussions regarding his wound, as well as his prostheses.   - Weight Bearing Status/Activity: WBAT  - Additional recommended labs/tests: Continue to trend ESR and CRP  -VTE Prophylaxis: Per medicine  - Pain control: As needed  - Follow-up plan: 1-2 weeks with Dr. Sharol Given  -Procedures: None, as long as he remains stable  Chief Complaint: Right AKA wound issue  HPI: KAEDEN MESTER is a 61 y.o. male with past medical history significant for bilateral AKA's following compartment syndrome.  He has done well for the past 6 years.  However, over the past 1 to 2 weeks, he states he started in with some redness.  He also had some drainage from the right AKA stump.  The drainage was clear.  He noticed some worsening pain.  He had episodes of dizziness.  He contacted his primary care provider, and tried to get some antibiotics prescribed.  However, he ultimately presented to the emergency department for further evaluation.  He also notes that the prostheses have been poorly fitting for a while.  He notes that he had difficulty with transportation, due  to family's being in poor health.  He denies fevers and chills.  He has not seen Dr. Sharol Given for a while.  Since admission, and initiation of antibiotics, he notes that the pain has gotten better.  He does feel as though the redness has increased.  Past Medical History:  Diagnosis Date   Back pain    Chest pain    DDD (degenerative disc disease), cervical    Depression    DVT (deep venous thrombosis) (HCC)    GERD (gastroesophageal reflux disease)    HTN (hypertension)    Kidney stones    Past Surgical History:  Procedure Laterality Date   AMPUTATION Bilateral 01/09/2016   Procedure: Bilateral Above Knee Amputation, Apply Wound VAC;  Surgeon: Newt Minion, MD;  Location: [facility];  Service: Orthopedics;  Laterality: Bilateral;   FASCIOTOMY Bilateral 01/03/2016   Procedure: FASCIOTOMY;  Surgeon: Leandrew Koyanagi, MD;  Location: [facility];  Service: Orthopedics;  Laterality: Bilateral;   I & D EXTREMITY Bilateral 01/05/2016   Procedure: IRRIGATION AND DEBRIDEMENT BILATERAL LOWER EXTREMITIES; WOUND VAC CHANGE;  Surgeon: Leandrew Koyanagi, MD;  Location: [facility];  Service: Orthopedics;  Laterality: Bilateral;   I & D EXTREMITY Left 01/07/2016   Procedure: Irrigation and Debridement of Left Lower Extremity with Application of Wound Vac;  Surgeon: Leandrew Koyanagi, MD;  Location: [facility];  Service: Orthopedics;  Laterality: Left;   VASECTOMY     Social History   Socioeconomic History   Marital status: Single    Spouse  name: Not on file   Number of children: 3   Years of education: Not on file   Highest education level: Not on file  Occupational History   Occupation: disabled  Tobacco Use   Smoking status: Some Days    Types: Cigars   Smokeless tobacco: Current  Substance and Sexual Activity   Alcohol use: Yes    Alcohol/week: 0.0 standard drinks of alcohol    Comment: 12 pack a month   Drug use: Not on file   Sexual activity: Not on file  Other Topics Concern   Not on file  Social History Narrative   Not  on file   Social Determinants of Health   Financial Resource Strain: Not on file  Food Insecurity: Not on file  Transportation Needs: Not on file  Physical Activity: Not on file  Stress: Not on file  Social Connections: Not on file   Family History  Problem Relation Age of Onset   Hypertension Father    Allergies  Allergen Reactions   Sulfacetamide Sodium Other (See Comments)    edema   Prior to Admission medications   Medication Sig Start Date End Date Taking? Authorizing Provider  DULoxetine (CYMBALTA) 60 MG capsule Take 60 mg by mouth daily.   Yes [provider]  fluticasone (FLONASE) 50 MCG/ACT nasal spray Place 2 sprays into both nostrils daily. 03/20/16  Yes Everrett Coombe, MD  gabapentin (NEURONTIN) 300 MG capsule Take 300 mg by mouth 3 (three) times daily. 05/16/22  Yes [provider]  omeprazole (PRILOSEC) 20 MG capsule Take 20 mg by mouth daily. 03/19/22  Yes [provider]  Oxycodone HCl 20 MG TABS Take 1 tablet by mouth 5 (five) times daily. 06/06/22  Yes [provider]  Vitamin D, Ergocalciferol, (DRISDOL) 1.25 MG (50000 UNIT) CAPS capsule Take 50,000 Units by mouth once a week. 04/30/22  Yes [provider]  montelukast (SINGULAIR) 10 MG tablet Take 10 mg by mouth daily as needed. allergies 12/25/15   [provider]   DG Femur Min 2 Views Right  Result Date: 06/13/2022 CLINICAL DATA:  Possible osteo EXAM: RIGHT FEMUR 2 VIEWS COMPARISON:  None Available. FINDINGS: Prior right above the knee amputation. No cortical irregularity is seen at the osteotomy site. No acute fracture or dislocation. IMPRESSION: Prior above the knee amputation. No radiographic evidence of osteomyelitis. Electronically Signed   By: Yetta Glassman M.D.   On: 06/13/2022 20:12    Family History Reviewed and non-contributory, no pertinent history of problems with bleeding or anesthesia    Review of Systems No fevers or chills No numbness or  tingling No chest pain No shortness of breath No bowel or bladder dysfunction No GI distress No headaches Dizziness    OBJECTIVE  Vitals:Patient Vitals for the past 8 hrs:  BP Temp Pulse Resp SpO2  06/14/22 0337 (!) 112/96 (!) 96.9 F (36.1 C) 82 16 98 %   General: Alert, no acute distress Cardiovascular: Warm extremities noted Respiratory: No cyanosis, no use of accessory musculature GI: No organomegaly, abdomen is soft and non-tender Skin: No lesions in the area of chief complaint other than those listed below in MSK exam.  Psychiatric: Patient is competent for consent with normal mood and affect Lymphatic: No swelling obvious and reported other than the area involved in the exam below Extremities   LLE: Left AKA stump is healed.  Surgical site incision without surrounding erythema or drainage.  He has full function of the remaining stump.  RLE: Right AKA stump with a well-healed surgical incision.  There is an ulceration, that is scabbed currently.  No drainage.  There is surrounding erythema.  No induration.  No areas of fluctuance.  He does have some tenderness to palpation within the area of redness.  The ulceration is not particularly painful.       Test Results Imaging X-ray of the right femur demonstrates a healthy-appearing stump following AKA.  No evidence of osteomyelitis.  No lucencies within the remaining bone.  Labs cbc Recent Labs    06/13/22 1736 06/14/22 0349  WBC 11.8* 10.2  HGB 14.5 13.9  HCT 44.9 42.1  PLT 322 313     Recent Labs    06/13/22 1736 06/14/22 0349  NA 140 142  K 3.8 3.7  CL 104 106  CO2 28 26  GLUCOSE 108* 117*  BUN 14 20  CREATININE 1.03 1.03  CALCIUM 9.1 8.8*

## 2022-06-16 LAB — HEMOGLOBIN A1C
Hgb A1c MFr Bld: 5.8 % — ABNORMAL HIGH (ref 4.8–5.6)
Mean Plasma Glucose: 120 mg/dL

## 2022-06-17 ENCOUNTER — Ambulatory Visit (INDEPENDENT_AMBULATORY_CARE_PROVIDER_SITE_OTHER): Payer: Medicaid Other | Admitting: Orthopedic Surgery

## 2022-06-17 DIAGNOSIS — Z89611 Acquired absence of right leg above knee: Secondary | ICD-10-CM | POA: Diagnosis not present

## 2022-06-17 DIAGNOSIS — Z89612 Acquired absence of left leg above knee: Secondary | ICD-10-CM

## 2022-06-17 DIAGNOSIS — L03115 Cellulitis of right lower limb: Secondary | ICD-10-CM

## 2022-06-27 ENCOUNTER — Telehealth: Payer: Self-pay | Admitting: Orthopedic Surgery

## 2022-06-27 ENCOUNTER — Other Ambulatory Visit: Payer: Self-pay | Admitting: Orthopedic Surgery

## 2022-06-27 MED ORDER — DOXYCYCLINE HYCLATE 100 MG PO TABS: 100.0000 mg | ORAL_TABLET | Freq: Two times a day (BID) | ORAL | 0 refills | Status: DC

## 2022-06-27 NOTE — Telephone Encounter (Signed)
Pt called in requesting refill on medication (doxycycline (VIBRA-TABS) tablet 100 mg)... Pt requesting callback

## 2022-06-27 NOTE — Telephone Encounter (Signed)
You saw this pt in the office 06/13/2022 dictation pending. He is requesting refill on ABX

## 2022-06-29 ENCOUNTER — Encounter: Payer: Self-pay | Admitting: Orthopedic Surgery

## 2022-06-29 NOTE — Progress Notes (Signed)
Office Visit Note   Patient: Jeffery Gross           Date of Birth: 01-02-1961           MRN: [id number] Visit Date: 06/17/2022              Requested by: Salli Real, MD [address] [place],  [place] [address] PCP: Salli Real, MD  Chief Complaint  Patient presents with   Right Leg - Wound Check    Hx right AKA, hospital f/u for right leg cellulitis      HPI: Patient is a 61 year old gentleman status post bilateral above-knee amputations in 2017.  Patient recently has gone to the emergency room for right leg cellulitis.  Patient had is currently on doxycycline and Keflex.  He has a history of MRSA.  Assessment & Plan: Visit Diagnoses:  1. S/P AKA (above knee amputation) bilateral (HCC)   2. Cellulitis of right leg     Plan: Continue the antibiotics and dry dressing changes.  Follow-Up Instructions: Return in about 2 weeks (around 07/01/2022).   Ortho Exam  Patient is alert, oriented, no adenopathy, well-dressed, normal affect, normal respiratory effort. Examination of the right above-the-knee amputation patient has an area of cellulitis approximately 6 cm in diameter there is clear drainage there is an open wound without purulence.  Radiograph shows no lytic changes of the bone no signs of osteomyelitis.  Patient will wash his leg with soap and water and dry dressing change daily.  Imaging: No results found.   Labs: Lab Results  Component Value Date   HGBA1C 5.8 (H) 06/14/2022   ESRSEDRATE 43 (H) 06/14/2022   CRP 4.3 (H) 06/14/2022   REPTSTATUS 01/25/2016 FINAL 01/24/2016   CULT NO GROWTH 01/24/2016     Lab Results  Component Value Date   ALBUMIN 3.6 06/13/2022   ALBUMIN 3.3 (L) 01/30/2016   ALBUMIN 2.8 (L) 01/24/2016   PREALBUMIN 18 06/14/2022    Lab Results  Component Value Date   MG 2.3 01/07/2016   No results found for: "VD25OH"  Lab Results  Component Value Date   PREALBUMIN 18 06/14/2022      Latest Ref Rng & Units 06/14/2022    3:49 AM  06/13/2022    5:36 PM 02/08/2016   12:30 PM  CBC EXTENDED  WBC 4.0 - 10.5 K/uL 10.2  11.8  8.2   RBC 4.22 - 5.81 MIL/uL 4.90  5.22  4.07   Hemoglobin 13.0 - 17.0 g/dL 20.3  55.9  74.1   HCT 39.0 - 52.0 % 42.1  44.9  33.9   Platelets 150 - 400 K/uL 313  322  228   NEUT# 1.7 - 7.7 K/uL  8.6    Lymph# 0.7 - 4.0 K/uL  2.2       There is no height or weight on file to calculate BMI.  Orders:  No orders of the defined types were placed in this encounter.  No orders of the defined types were placed in this encounter.    Procedures: No procedures performed  Clinical Data: No additional findings.  ROS:  All other systems negative, except as noted in the HPI. Review of Systems  Objective: Vital Signs: There were no vitals taken for this visit.  Specialty Comments:  No specialty comments available.  PMFS History: Patient Active Problem List   Diagnosis Date Noted   History of MRSA infection 06/14/2022   Cellulitis of right lower extremity 06/14/2022   Infected wound 06/13/2022  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   Seasonal allergies 02/08/2016   Anemia due to other cause 02/08/2016   Chronic pain syndrome 02/08/2016   Depression 02/08/2016   Post-operative pain    Ischemia of lower extremity 01/23/2016   Hypoalbuminemia due to protein-calorie malnutrition (HCC)    Dysuria    Tobacco abuse    Constipation due to pain medication    Chronic low back pain    Benign essential HTN    Asthma    Hyponatremia    CKD (chronic kidney disease)    Anemia of chronic disease    Transaminitis    Phantom limb syndrome with pain (HCC)    S/P AKA (above knee amputation) bilateral (HCC)    Acute kidney injury (HCC)    Syncope    Pressure ulcer 01/12/2016   Fall    Encounter for central line placement    Urinary retention    AKI (acute kidney injury) (HCC)    Traumatic rhabdomyolysis (HCC)    Rhabdomyolysis 01/03/2016   Nontraumatic compartment syndrome of leg 01/03/2016   Chest  pain 10/27/2012   HTN (hypertension) 10/27/2012   Back pain 10/27/2012   Past Medical History:  Diagnosis Date   Back pain    Chest pain    DDD (degenerative disc disease), cervical    Depression    DVT (deep venous thrombosis) (HCC)    GERD (gastroesophageal reflux disease)    HTN (hypertension)    Kidney stones     Family History  Problem Relation Age of Onset   Hypertension Father     Past Surgical History:  Procedure Laterality Date   AMPUTATION Bilateral 01/09/2016   Procedure: Bilateral Above Knee Amputation, Apply Wound VAC;  Surgeon: Nadara Mustard, MD;  Location: [facility];  Service: Orthopedics;  Laterality: Bilateral;   FASCIOTOMY Bilateral 01/03/2016   Procedure: FASCIOTOMY;  Surgeon: Tarry Kos, MD;  Location: [facility];  Service: Orthopedics;  Laterality: Bilateral;   I & D EXTREMITY Bilateral 01/05/2016   Procedure: IRRIGATION AND DEBRIDEMENT BILATERAL LOWER EXTREMITIES; WOUND VAC CHANGE;  Surgeon: Tarry Kos, MD;  Location: [facility];  Service: Orthopedics;  Laterality: Bilateral;   I & D EXTREMITY Left 01/07/2016   Procedure: Irrigation and Debridement of Left Lower Extremity with Application of Wound Vac;  Surgeon: Tarry Kos, MD;  Location: [facility];  Service: Orthopedics;  Laterality: Left;   VASECTOMY     Social History   Occupational History   Occupation: disabled  Tobacco Use   Smoking status: Some Days    Types: Cigars   Smokeless tobacco: Current  Substance and Sexual Activity   Alcohol use: Yes    Alcohol/week: 0.0 standard drinks of alcohol    Comment: 12 pack a month   Drug use: Not on file   Sexual activity: Not on file

## 2022-07-01 ENCOUNTER — Ambulatory Visit (INDEPENDENT_AMBULATORY_CARE_PROVIDER_SITE_OTHER): Payer: Medicaid Other | Admitting: Orthopedic Surgery

## 2022-07-01 ENCOUNTER — Encounter: Payer: Self-pay | Admitting: Orthopedic Surgery

## 2022-07-01 DIAGNOSIS — Z89611 Acquired absence of right leg above knee: Secondary | ICD-10-CM | POA: Diagnosis not present

## 2022-07-01 DIAGNOSIS — Z89612 Acquired absence of left leg above knee: Secondary | ICD-10-CM | POA: Diagnosis not present

## 2022-07-01 MED ORDER — OXYCODONE HCL 20 MG PO TABS: 1.0000 | ORAL_TABLET | Freq: Every day | ORAL | 0 refills | Status: DC

## 2022-07-01 NOTE — Progress Notes (Signed)
Office Visit Note   Patient: Jeffery Gross           Date of Birth: September 09, 1960           MRN: [id number] Visit Date: 07/01/2022              Requested by: Salli Real, MD [address] [place],  [place] [address] PCP: Salli Real, MD  Chief Complaint  Patient presents with   Right Leg - Follow-up    Cellulitis follow up       HPI: Patient is a 61 year old gentleman who is seen for evaluation for cellulitis and ulceration right transtibial amputation patient states he has had purulent drainage as well as yellow fluid.  Patient complains of pain and redness over the residual limb.  Patient has been on doxycycline without improvement.  Assessment & Plan: Visit Diagnoses:  1. S/P AKA (above knee amputation) bilateral (HCC)     Plan: With the exposed bone and cellulitis have recommended proceeding with a right above-the-knee amputation revision.  Will proceed as outpatient surgery discharge to home with a Prevena wound VAC.  Patient was provided a prescription to refill his oxycodone 20 mg 5 times a day.  This was sent to [facility] in [place].  Follow-Up Instructions: Return in about 2 weeks (around 07/15/2022).   Ortho Exam  Patient is alert, oriented, no adenopathy, well-dressed, normal affect, normal respiratory effort. Examination patient has a well-healed left above-knee amputation.  Examination the right above-the-knee amputation shows an area of cellulitis of approximately 5 cm in circumference over the residual limb this is tender to palpation.  Patient has an ulcer with clear drainage.  The ulcer was probed and this probes down to the distal femur.  Imaging: No results found. No images are attached to the encounter.  Labs: Lab Results  Component Value Date   HGBA1C 5.8 (H) 06/14/2022   ESRSEDRATE 43 (H) 06/14/2022   CRP 4.3 (H) 06/14/2022   REPTSTATUS 01/25/2016 FINAL 01/24/2016   CULT NO GROWTH 01/24/2016     Lab Results  Component Value Date   ALBUMIN 3.6  06/13/2022   ALBUMIN 3.3 (L) 01/30/2016   ALBUMIN 2.8 (L) 01/24/2016   PREALBUMIN 18 06/14/2022    Lab Results  Component Value Date   MG 2.3 01/07/2016   No results found for: "VD25OH"  Lab Results  Component Value Date   PREALBUMIN 18 06/14/2022      Latest Ref Rng & Units 06/14/2022    3:49 AM 06/13/2022    5:36 PM 02/08/2016   12:30 PM  CBC EXTENDED  WBC 4.0 - 10.5 K/uL 10.2  11.8  8.2   RBC 4.22 - 5.81 MIL/uL 4.90  5.22  4.07   Hemoglobin 13.0 - 17.0 g/dL 26.3  78.5  88.5   HCT 39.0 - 52.0 % 42.1  44.9  33.9   Platelets 150 - 400 K/uL 313  322  228   NEUT# 1.7 - 7.7 K/uL  8.6    Lymph# 0.7 - 4.0 K/uL  2.2       There is no height or weight on file to calculate BMI.  Orders:  No orders of the defined types were placed in this encounter.  Meds ordered this encounter  Medications   Oxycodone HCl 20 MG TABS    Sig: Take 1 tablet (20 mg total) by mouth 5 (five) times daily.    Dispense:  30 tablet    Refill:  0  Procedures: No procedures performed  Clinical Data: No additional findings.  ROS:  All other systems negative, except as noted in the HPI. Review of Systems  Objective: Vital Signs: There were no vitals taken for this visit.  Specialty Comments:  No specialty comments available.  PMFS History: Patient Active Problem List   Diagnosis Date Noted   History of MRSA infection 06/14/2022   Cellulitis of right lower extremity 06/14/2022   Infected wound 06/13/2022   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   Seasonal allergies 02/08/2016   Anemia due to other cause 02/08/2016   Chronic pain syndrome 02/08/2016   Depression 02/08/2016   Post-operative pain    Ischemia of lower extremity 01/23/2016   Hypoalbuminemia due to protein-calorie malnutrition (HCC)    Dysuria    Tobacco abuse    Constipation due to pain medication    Chronic low back pain    Benign essential HTN    Asthma    Hyponatremia    CKD (chronic kidney disease)    Anemia of  chronic disease    Transaminitis    Phantom limb syndrome with pain (HCC)    S/P AKA (above knee amputation) bilateral (HCC)    Acute kidney injury (HCC)    Syncope    Pressure ulcer 01/12/2016   Fall    Encounter for central line placement    Urinary retention    AKI (acute kidney injury) (HCC)    Traumatic rhabdomyolysis (HCC)    Rhabdomyolysis 01/03/2016   Nontraumatic compartment syndrome of leg 01/03/2016   Chest pain 10/27/2012   HTN (hypertension) 10/27/2012   Back pain 10/27/2012   Past Medical History:  Diagnosis Date   Back pain    Chest pain    DDD (degenerative disc disease), cervical    Depression    DVT (deep venous thrombosis) (HCC)    GERD (gastroesophageal reflux disease)    HTN (hypertension)    Kidney stones     Family History  Problem Relation Age of Onset   Hypertension Father     Past Surgical History:  Procedure Laterality Date   AMPUTATION Bilateral 01/09/2016   Procedure: Bilateral Above Knee Amputation, Apply Wound VAC;  Surgeon: Nadara Mustard, MD;  Location: [facility] OR;  Service: Orthopedics;  Laterality: Bilateral;   FASCIOTOMY Bilateral 01/03/2016   Procedure: FASCIOTOMY;  Surgeon: Tarry Kos, MD;  Location: [facility] OR;  Service: Orthopedics;  Laterality: Bilateral;   I & D EXTREMITY Bilateral 01/05/2016   Procedure: IRRIGATION AND DEBRIDEMENT BILATERAL LOWER EXTREMITIES; WOUND VAC CHANGE;  Surgeon: Tarry Kos, MD;  Location: [facility] OR;  Service: Orthopedics;  Laterality: Bilateral;   I & D EXTREMITY Left 01/07/2016   Procedure: Irrigation and Debridement of Left Lower Extremity with Application of Wound Vac;  Surgeon: Tarry Kos, MD;  Location: [facility] OR;  Service: Orthopedics;  Laterality: Left;   VASECTOMY     Social History   Occupational History   Occupation: disabled  Tobacco Use   Smoking status: Some Days    Types: Cigars   Smokeless tobacco: Current  Substance and Sexual Activity   Alcohol use: Yes    Alcohol/week: 0.0 standard drinks of  alcohol    Comment: 12 pack a month   Drug use: Not on file   Sexual activity: Not on file

## 2022-07-03 ENCOUNTER — Encounter (HOSPITAL_COMMUNITY): Payer: Self-pay | Admitting: Orthopedic Surgery

## 2022-07-03 ENCOUNTER — Other Ambulatory Visit: Payer: Self-pay

## 2022-07-03 NOTE — Progress Notes (Signed)
Jeffery Gross denies chest pain or shortness of breath. Patient denies having any s/s of Covid in his household, also denies any known exposure to Covid.  Mr. Grandstaff reports that the amputation is having yellow drainage now.Mr. Majchrzak 's PCP is Dr. Federico Flake

## 2022-07-03 NOTE — Anesthesia Preprocedure Evaluation (Addendum)
Anesthesia Evaluation  Patient identified by MRN, date of birth, ID band Patient awake    Reviewed: Allergy & Precautions, NPO status , Patient's Chart, lab work & pertinent test results  Airway Mallampati: II  TM Distance: >3 FB Neck ROM: Full    Dental no notable dental hx. (+) Teeth Intact, Dental Advisory Given   Pulmonary Current Smoker   Pulmonary exam normal breath sounds clear to auscultation       Cardiovascular hypertension, + Peripheral Vascular Disease  Normal cardiovascular exam Rhythm:Regular Rate:Normal     Neuro/Psych  PSYCHIATRIC DISORDERS  Depression       GI/Hepatic ,GERD  ,,  Endo/Other    Renal/GU Renal diseaseLab Results      Component                Value               Date                      CREATININE               1.03                06/14/2022                BUN                      20                  06/14/2022                NA                       142                 06/14/2022                K                        3.7                 06/14/2022                CL                       106                 06/14/2022                CO2                      26                  06/14/2022                Musculoskeletal   Abdominal  (+) + obese  Peds  Hematology Lab Results      Component                Value               Date                      WBC  10.2                06/14/2022                HGB                      13.9                06/14/2022                HCT                      42.1                06/14/2022                MCV                      85.9                06/14/2022                PLT                      313                 06/14/2022              Anesthesia Other Findings All: sulfa  Reproductive/Obstetrics                             Anesthesia Physical Anesthesia Plan  ASA: 3  Anesthesia Plan: General    Post-op Pain Management: Ofirmev IV (intra-op)* and Dilaudid IV   Induction: Intravenous  PONV Risk Score and Plan: Treatment may vary due to age or medical condition, Midazolam, Dexamethasone and Ondansetron  Airway Management Planned: LMA  Additional Equipment: None  Intra-op Plan:   Post-operative Plan:   Informed Consent:      Dental advisory given  Plan Discussed with:   Anesthesia Plan Comments:         Anesthesia Quick Evaluation

## 2022-07-04 ENCOUNTER — Inpatient Hospital Stay (HOSPITAL_COMMUNITY)
Admission: RE | Admit: 2022-07-04 | Discharge: 2022-07-08 | DRG: 464 | Disposition: A | Discharge status: Home / Self Care | Payer: Medicaid Other | Attending: Orthopedic Surgery | Admitting: Orthopedic Surgery

## 2022-07-04 ENCOUNTER — Other Ambulatory Visit: Payer: Self-pay

## 2022-07-04 ENCOUNTER — Observation Stay (HOSPITAL_BASED_OUTPATIENT_CLINIC_OR_DEPARTMENT_OTHER): Payer: Medicaid Other | Admitting: Anesthesiology

## 2022-07-04 ENCOUNTER — Encounter (HOSPITAL_COMMUNITY): Payer: Self-pay | Admitting: Orthopedic Surgery

## 2022-07-04 ENCOUNTER — Observation Stay (HOSPITAL_COMMUNITY): Payer: Medicaid Other | Admitting: Anesthesiology

## 2022-07-04 ENCOUNTER — Encounter (HOSPITAL_COMMUNITY)
Admission: RE | Disposition: A | Discharge status: Home / Self Care | Payer: Self-pay | Source: Home / Self Care | Attending: Orthopedic Surgery

## 2022-07-04 DIAGNOSIS — I10 Essential (primary) hypertension: Secondary | ICD-10-CM

## 2022-07-04 DIAGNOSIS — Y835 Amputation of limb(s) as the cause of abnormal reaction of the patient, or of later complication, without mention of misadventure at the time of the procedure: Secondary | ICD-10-CM | POA: Diagnosis present

## 2022-07-04 DIAGNOSIS — Z89611 Acquired absence of right leg above knee: Secondary | ICD-10-CM | POA: Diagnosis not present

## 2022-07-04 DIAGNOSIS — Z8249 Family history of ischemic heart disease and other diseases of the circulatory system: Secondary | ICD-10-CM

## 2022-07-04 DIAGNOSIS — L97119 Non-pressure chronic ulcer of right thigh with unspecified severity: Secondary | ICD-10-CM | POA: Diagnosis present

## 2022-07-04 DIAGNOSIS — Z86718 Personal history of other venous thrombosis and embolism: Secondary | ICD-10-CM

## 2022-07-04 DIAGNOSIS — T8781 Dehiscence of amputation stump: Principal | ICD-10-CM | POA: Diagnosis present

## 2022-07-04 DIAGNOSIS — E669 Obesity, unspecified: Secondary | ICD-10-CM | POA: Diagnosis not present

## 2022-07-04 DIAGNOSIS — M868X5 Other osteomyelitis, thigh: Secondary | ICD-10-CM | POA: Diagnosis present

## 2022-07-04 DIAGNOSIS — M869 Osteomyelitis, unspecified: Secondary | ICD-10-CM

## 2022-07-04 DIAGNOSIS — Z6841 Body Mass Index (BMI) 40.0 and over, adult: Secondary | ICD-10-CM

## 2022-07-04 DIAGNOSIS — L03115 Cellulitis of right lower limb: Secondary | ICD-10-CM | POA: Diagnosis present

## 2022-07-04 DIAGNOSIS — K219 Gastro-esophageal reflux disease without esophagitis: Secondary | ICD-10-CM | POA: Diagnosis present

## 2022-07-04 DIAGNOSIS — T8130XA Disruption of wound, unspecified, initial encounter: Secondary | ICD-10-CM | POA: Diagnosis present

## 2022-07-04 DIAGNOSIS — F17210 Nicotine dependence, cigarettes, uncomplicated: Secondary | ICD-10-CM

## 2022-07-04 DIAGNOSIS — Z882 Allergy status to sulfonamides status: Secondary | ICD-10-CM

## 2022-07-04 DIAGNOSIS — T879 Unspecified complications of amputation stump: Secondary | ICD-10-CM | POA: Diagnosis present

## 2022-07-04 HISTORY — DX: Personal history of urinary calculi: Z87.442

## 2022-07-04 HISTORY — PX: STUMP REVISION: SHX6102

## 2022-07-04 LAB — CBC WITH DIFFERENTIAL/PLATELET
Abs Immature Granulocytes: 0.05 10*3/uL (ref 0.00–0.07)
Basophils Absolute: 0 10*3/uL (ref 0.0–0.1)
Basophils Relative: 0 %
Eosinophils Absolute: 0.3 10*3/uL (ref 0.0–0.5)
Eosinophils Relative: 2 %
HCT: 42.5 % (ref 39.0–52.0)
Hemoglobin: 14.5 g/dL (ref 13.0–17.0)
Immature Granulocytes: 0 %
Lymphocytes Relative: 25 %
Lymphs Abs: 3 10*3/uL (ref 0.7–4.0)
MCH: 28.7 pg (ref 26.0–34.0)
MCHC: 34.1 g/dL (ref 30.0–36.0)
MCV: 84 fL (ref 80.0–100.0)
Monocytes Absolute: 1 10*3/uL (ref 0.1–1.0)
Monocytes Relative: 8 %
Neutro Abs: 8 10*3/uL — ABNORMAL HIGH (ref 1.7–7.7)
Neutrophils Relative %: 65 %
Platelets: 229 10*3/uL (ref 150–400)
RBC: 5.06 MIL/uL (ref 4.22–5.81)
RDW: 13.6 % (ref 11.5–15.5)
WBC: 12.4 10*3/uL — ABNORMAL HIGH (ref 4.0–10.5)
nRBC: 0 % (ref 0.0–0.2)

## 2022-07-04 LAB — COMPREHENSIVE METABOLIC PANEL
ALT: 21 U/L (ref 0–44)
AST: 27 U/L (ref 15–41)
Albumin: 3.7 g/dL (ref 3.5–5.0)
Alkaline Phosphatase: 76 U/L (ref 38–126)
Anion gap: 10 (ref 5–15)
BUN: 20 mg/dL (ref 8–23)
CO2: 26 mmol/L (ref 22–32)
Calcium: 9.4 mg/dL (ref 8.9–10.3)
Chloride: 102 mmol/L (ref 98–111)
Creatinine, Ser: 1.09 mg/dL (ref 0.61–1.24)
GFR, Estimated: >60 mL/min (ref >60)
Glucose, Bld: 91 mg/dL (ref 70–99)
Potassium: 4 mmol/L (ref 3.5–5.1)
Sodium: 138 mmol/L (ref 135–145)
Total Bilirubin: <0.1 mg/dL — ABNORMAL LOW (ref 0.3–1.2)
Total Protein: 6.2 g/dL — ABNORMAL LOW (ref 6.5–8.1)

## 2022-07-04 LAB — VITAMIN D 25 HYDROXY (VIT D DEFICIENCY, FRACTURES): Vit D, 25-Hydroxy: 63.57 ng/mL (ref 30–100)

## 2022-07-04 LAB — SURGICAL PCR SCREEN
MRSA, PCR: NEGATIVE
Staphylococcus aureus: NEGATIVE

## 2022-07-04 LAB — PREALBUMIN: Prealbumin: 29 mg/dL (ref 18–38)

## 2022-07-04 SURGERY — REVISION, AMPUTATION SITE
Anesthesia: General | Site: Leg Upper | Laterality: Right

## 2022-07-04 MED ORDER — METOPROLOL TARTRATE 5 MG/5ML IV SOLN: 2.0000 mg | INTRAVENOUS | Status: DC | PRN

## 2022-07-04 MED ORDER — JUVEN PO PACK
1.0000 | PACK | Freq: Two times a day (BID) | ORAL | Status: DC
  Administered 2022-07-04 – 2022-07-08 (×9): 1 via ORAL
  Filled 2022-07-04 (×9): qty 1

## 2022-07-04 MED ORDER — DULOXETINE HCL 60 MG PO CPEP
60.0000 mg | ORAL_CAPSULE | Freq: Every day | ORAL | Status: DC
  Administered 2022-07-04 – 2022-07-08 (×5): 60 mg via ORAL
  Filled 2022-07-04 (×5): qty 1

## 2022-07-04 MED ORDER — ACETAMINOPHEN 325 MG PO TABS: 325.0000 mg | ORAL_TABLET | Freq: Four times a day (QID) | ORAL | Status: DC | PRN

## 2022-07-04 MED ORDER — OXYCODONE HCL 5 MG PO TABS
20.0000 mg | ORAL_TABLET | Freq: Every day | ORAL | Status: DC
  Administered 2022-07-04 – 2022-07-08 (×21): 20 mg via ORAL
  Filled 2022-07-04 (×20): qty 4

## 2022-07-04 MED ORDER — ONDANSETRON HCL 4 MG/2ML IJ SOLN
INTRAMUSCULAR | Status: AC
  Filled 2022-07-04: qty 2

## 2022-07-04 MED ORDER — INFLUENZA VAC SPLIT QUAD 0.5 ML IM SUSY: 0.5000 mL | PREFILLED_SYRINGE | INTRAMUSCULAR | Status: DC

## 2022-07-04 MED ORDER — HYDROMORPHONE HCL 1 MG/ML IJ SOLN
INTRAMUSCULAR | Status: AC
  Filled 2022-07-04: qty 1

## 2022-07-04 MED ORDER — ACETAMINOPHEN 10 MG/ML IV SOLN
INTRAVENOUS | Status: DC | PRN
  Administered 2022-07-04: 1000 mg via INTRAVENOUS

## 2022-07-04 MED ORDER — CEFAZOLIN SODIUM-DEXTROSE 2-4 GM/100ML-% IV SOLN
2.0000 g | Freq: Three times a day (TID) | INTRAVENOUS | Status: AC
  Administered 2022-07-04: 2 g via INTRAVENOUS

## 2022-07-04 MED ORDER — MIDAZOLAM HCL 2 MG/2ML IJ SOLN
INTRAMUSCULAR | Status: AC
  Filled 2022-07-04: qty 2

## 2022-07-04 MED ORDER — PHENYLEPHRINE 80 MCG/ML (10ML) SYRINGE FOR IV PUSH (FOR BLOOD PRESSURE SUPPORT)
PREFILLED_SYRINGE | INTRAVENOUS | Status: DC | PRN
  Administered 2022-07-04: 80 ug via INTRAVENOUS

## 2022-07-04 MED ORDER — PROPOFOL 10 MG/ML IV BOLUS
INTRAVENOUS | Status: DC | PRN
  Administered 2022-07-04: 150 mg via INTRAVENOUS
  Administered 2022-07-04: 50 mg via INTRAVENOUS

## 2022-07-04 MED ORDER — HYDROMORPHONE HCL 1 MG/ML IJ SOLN
0.2500 mg | INTRAMUSCULAR | Status: DC | PRN
  Administered 2022-07-04 (×4): 0.5 mg via INTRAVENOUS

## 2022-07-04 MED ORDER — MAGNESIUM SULFATE 2 GM/50ML IV SOLN: 2.0000 g | Freq: Every day | INTRAVENOUS | Status: DC | PRN

## 2022-07-04 MED ORDER — PANTOPRAZOLE SODIUM 40 MG PO TBEC
40.0000 mg | DELAYED_RELEASE_TABLET | Freq: Every day | ORAL | Status: DC
  Administered 2022-07-04 – 2022-07-08 (×5): 40 mg via ORAL
  Filled 2022-07-04 (×5): qty 1

## 2022-07-04 MED ORDER — ONDANSETRON HCL 4 MG/2ML IJ SOLN
INTRAMUSCULAR | Status: DC | PRN
  Administered 2022-07-04: 4 mg via INTRAVENOUS

## 2022-07-04 MED ORDER — POTASSIUM CHLORIDE CRYS ER 20 MEQ PO TBCR: 20.0000 meq | EXTENDED_RELEASE_TABLET | Freq: Every day | ORAL | Status: DC | PRN

## 2022-07-04 MED ORDER — PANTOPRAZOLE SODIUM 40 MG PO TBEC: 40.0000 mg | DELAYED_RELEASE_TABLET | Freq: Every day | ORAL | Status: DC

## 2022-07-04 MED ORDER — FENTANYL CITRATE (PF) 250 MCG/5ML IJ SOLN
INTRAMUSCULAR | Status: AC
  Filled 2022-07-04: qty 5

## 2022-07-04 MED ORDER — ONDANSETRON HCL 4 MG/2ML IJ SOLN: 4.0000 mg | Freq: Four times a day (QID) | INTRAMUSCULAR | Status: DC | PRN

## 2022-07-04 MED ORDER — 0.9 % SODIUM CHLORIDE (POUR BTL) OPTIME
TOPICAL | Status: DC | PRN
  Administered 2022-07-04: 1000 mL

## 2022-07-04 MED ORDER — DOCUSATE SODIUM 100 MG PO CAPS
100.0000 mg | ORAL_CAPSULE | Freq: Every day | ORAL | Status: DC
  Administered 2022-07-05 – 2022-07-08 (×4): 100 mg via ORAL
  Filled 2022-07-04 (×4): qty 1

## 2022-07-04 MED ORDER — MAGNESIUM CITRATE PO SOLN: 1.0000 | Freq: Once | ORAL | Status: DC | PRN

## 2022-07-04 MED ORDER — ACETAMINOPHEN 10 MG/ML IV SOLN: 1000.0000 mg | Freq: Once | INTRAVENOUS | Status: DC | PRN

## 2022-07-04 MED ORDER — PNEUMOCOCCAL 20-VAL CONJ VACC 0.5 ML IM SUSY
0.5000 mL | PREFILLED_SYRINGE | INTRAMUSCULAR | Status: DC
  Filled 2022-07-04: qty 0.5

## 2022-07-04 MED ORDER — HYDROMORPHONE HCL 1 MG/ML IJ SOLN
0.5000 mg | INTRAMUSCULAR | Status: DC | PRN
  Administered 2022-07-04 – 2022-07-07 (×11): 1 mg via INTRAVENOUS
  Filled 2022-07-04 (×12): qty 1

## 2022-07-04 MED ORDER — CHLORHEXIDINE GLUCONATE 0.12 % MT SOLN
OROMUCOSAL | Status: AC
  Administered 2022-07-04: 15 mL
  Filled 2022-07-04: qty 15

## 2022-07-04 MED ORDER — SODIUM CHLORIDE 0.9 % IV SOLN: INTRAVENOUS | Status: DC

## 2022-07-04 MED ORDER — BISACODYL 5 MG PO TBEC
5.0000 mg | DELAYED_RELEASE_TABLET | Freq: Every day | ORAL | Status: DC | PRN
  Administered 2022-07-06 – 2022-07-07 (×2): 5 mg via ORAL
  Filled 2022-07-04 (×2): qty 1

## 2022-07-04 MED ORDER — POLYETHYLENE GLYCOL 3350 17 G PO PACK: 17.0000 g | PACK | Freq: Every day | ORAL | Status: DC | PRN

## 2022-07-04 MED ORDER — PHENOL 1.4 % MT LIQD: 1.0000 | OROMUCOSAL | Status: DC | PRN

## 2022-07-04 MED ORDER — VITAMIN C 500 MG PO TABS
1000.0000 mg | ORAL_TABLET | Freq: Every day | ORAL | Status: DC
  Administered 2022-07-04 – 2022-07-08 (×5): 1000 mg via ORAL
  Filled 2022-07-04 (×5): qty 2

## 2022-07-04 MED ORDER — OXYCODONE HCL 5 MG PO TABS: 5.0000 mg | ORAL_TABLET | Freq: Once | ORAL | Status: AC | PRN

## 2022-07-04 MED ORDER — OXYCODONE HCL 5 MG PO TABS: 10.0000 mg | ORAL_TABLET | ORAL | Status: DC | PRN

## 2022-07-04 MED ORDER — GABAPENTIN 300 MG PO CAPS
300.0000 mg | ORAL_CAPSULE | Freq: Three times a day (TID) | ORAL | Status: DC
  Administered 2022-07-04 – 2022-07-08 (×13): 300 mg via ORAL
  Filled 2022-07-04 (×13): qty 1

## 2022-07-04 MED ORDER — OXYCODONE HCL 5 MG/5ML PO SOLN
5.0000 mg | Freq: Once | ORAL | Status: AC | PRN
  Administered 2022-07-04: 5 mg via ORAL

## 2022-07-04 MED ORDER — FENTANYL CITRATE (PF) 250 MCG/5ML IJ SOLN
INTRAMUSCULAR | Status: DC | PRN
  Administered 2022-07-04 (×8): 25 ug via INTRAVENOUS

## 2022-07-04 MED ORDER — NICOTINE 21 MG/24HR TD PT24
21.0000 mg | MEDICATED_PATCH | Freq: Every day | TRANSDERMAL | Status: DC
  Administered 2022-07-04 – 2022-07-08 (×5): 21 mg via TRANSDERMAL
  Filled 2022-07-04 (×5): qty 1

## 2022-07-04 MED ORDER — GUAIFENESIN-DM 100-10 MG/5ML PO SYRP: 15.0000 mL | ORAL_SOLUTION | ORAL | Status: DC | PRN

## 2022-07-04 MED ORDER — CEFAZOLIN SODIUM-DEXTROSE 2-4 GM/100ML-% IV SOLN
2.0000 g | Freq: Three times a day (TID) | INTRAVENOUS | Status: DC
  Filled 2022-07-04: qty 100

## 2022-07-04 MED ORDER — ZINC SULFATE 220 (50 ZN) MG PO CAPS
220.0000 mg | ORAL_CAPSULE | Freq: Every day | ORAL | Status: DC
  Administered 2022-07-04 – 2022-07-08 (×5): 220 mg via ORAL
  Filled 2022-07-04 (×5): qty 1

## 2022-07-04 MED ORDER — PROPOFOL 10 MG/ML IV BOLUS
INTRAVENOUS | Status: AC
  Filled 2022-07-04: qty 20

## 2022-07-04 MED ORDER — ACETAMINOPHEN 10 MG/ML IV SOLN
INTRAVENOUS | Status: AC
  Filled 2022-07-04: qty 100

## 2022-07-04 MED ORDER — MIDAZOLAM HCL 2 MG/2ML IJ SOLN
INTRAMUSCULAR | Status: DC | PRN
  Administered 2022-07-04: 2 mg via INTRAVENOUS

## 2022-07-04 MED ORDER — OXYCODONE HCL 5 MG/5ML PO SOLN
ORAL | Status: AC
  Filled 2022-07-04: qty 5

## 2022-07-04 MED ORDER — LACTATED RINGERS IV SOLN: INTRAVENOUS | Status: DC

## 2022-07-04 MED ORDER — PHENYLEPHRINE 80 MCG/ML (10ML) SYRINGE FOR IV PUSH (FOR BLOOD PRESSURE SUPPORT)
PREFILLED_SYRINGE | INTRAVENOUS | Status: AC
  Filled 2022-07-04: qty 10

## 2022-07-04 MED ORDER — ONDANSETRON HCL 4 MG/2ML IJ SOLN: 4.0000 mg | Freq: Once | INTRAMUSCULAR | Status: DC | PRN

## 2022-07-04 MED ORDER — LABETALOL HCL 5 MG/ML IV SOLN: 10.0000 mg | INTRAVENOUS | Status: DC | PRN

## 2022-07-04 MED ORDER — OXYCODONE HCL 5 MG PO TABS
20.0000 mg | ORAL_TABLET | Freq: Every day | ORAL | Status: DC
  Filled 2022-07-04: qty 4

## 2022-07-04 MED ORDER — HYDRALAZINE HCL 20 MG/ML IJ SOLN: 5.0000 mg | INTRAMUSCULAR | Status: DC | PRN

## 2022-07-04 MED ORDER — OXYCODONE HCL 5 MG PO TABS
5.0000 mg | ORAL_TABLET | ORAL | Status: DC | PRN
  Administered 2022-07-07: 10 mg via ORAL
  Filled 2022-07-04: qty 2

## 2022-07-04 MED ORDER — ALUM & MAG HYDROXIDE-SIMETH 200-200-20 MG/5ML PO SUSP: 15.0000 mL | ORAL | Status: DC | PRN

## 2022-07-04 MED ORDER — AMISULPRIDE (ANTIEMETIC) 5 MG/2ML IV SOLN: 10.0000 mg | Freq: Once | INTRAVENOUS | Status: DC | PRN

## 2022-07-04 MED ORDER — CEFAZOLIN SODIUM-DEXTROSE 2-4 GM/100ML-% IV SOLN
2.0000 g | INTRAVENOUS | Status: AC
  Administered 2022-07-04: 2 g via INTRAVENOUS
  Filled 2022-07-04: qty 100

## 2022-07-04 SURGICAL SUPPLY — 35 items
BAG COUNTER SPONGE SURGICOUNT (BAG) ×1 IMPLANT
BAG SPNG CNTER NS LX DISP (BAG) ×1
BLADE SAW RECIP 87.9 MT (BLADE) IMPLANT
BLADE SURG 21 STRL SS (BLADE) ×1 IMPLANT
BNDG CMPR 5X4 CHSV STRCH STRL (GAUZE/BANDAGES/DRESSINGS) ×1
BNDG COHESIVE 4X5 TAN STRL LF (GAUZE/BANDAGES/DRESSINGS) IMPLANT
CANISTER WOUND CARE 500ML ATS (WOUND CARE) ×1 IMPLANT
COVER SURGICAL LIGHT HANDLE (MISCELLANEOUS) ×1 IMPLANT
DRAPE EXTREMITY T 121X128X90 (DISPOSABLE) ×1 IMPLANT
DRAPE HALF SHEET 40X57 (DRAPES) ×1 IMPLANT
DRAPE INCISE IOBAN 66X45 STRL (DRAPES) ×1 IMPLANT
DRAPE U-SHAPE 47X51 STRL (DRAPES) ×2 IMPLANT
DRESSING PREVENA PLUS CUSTOM (GAUZE/BANDAGES/DRESSINGS) ×1 IMPLANT
DRSG PREVENA PLUS CUSTOM (GAUZE/BANDAGES/DRESSINGS) ×1
DURAPREP 26ML APPLICATOR (WOUND CARE) ×1 IMPLANT
ELECT REM PT RETURN 9FT ADLT (ELECTROSURGICAL) ×1
ELECTRODE REM PT RTRN 9FT ADLT (ELECTROSURGICAL) ×1 IMPLANT
GLOVE BIOGEL PI IND STRL 9 (GLOVE) ×1 IMPLANT
GLOVE SURG ORTHO 9.0 STRL STRW (GLOVE) ×1 IMPLANT
GOWN STRL REUS W/ TWL XL LVL3 (GOWN DISPOSABLE) ×2 IMPLANT
GOWN STRL REUS W/TWL XL LVL3 (GOWN DISPOSABLE) ×2
GRAFT SKIN WND SURGICLOSE M95 (Tissue) IMPLANT
KIT BASIN OR (CUSTOM PROCEDURE TRAY) ×1 IMPLANT
KIT TURNOVER KIT B (KITS) ×1 IMPLANT
MANIFOLD NEPTUNE II (INSTRUMENTS) ×1 IMPLANT
NS IRRIG 1000ML POUR BTL (IV SOLUTION) ×1 IMPLANT
PACK GENERAL/GYN (CUSTOM PROCEDURE TRAY) ×1 IMPLANT
PAD ARMBOARD 7.5X6 YLW CONV (MISCELLANEOUS) ×1 IMPLANT
PREVENA RESTOR ARTHOFORM 46X30 (CANNISTER) ×1 IMPLANT
STAPLER VISISTAT 35W (STAPLE) IMPLANT
SUT ETHILON 2 0 PSLX (SUTURE) ×2 IMPLANT
SUT SILK 2 0 (SUTURE) ×1
SUT SILK 2-0 18XBRD TIE 12 (SUTURE) IMPLANT
SUT VIC AB 1 CTX 27 (SUTURE) IMPLANT
TOWEL GREEN STERILE (TOWEL DISPOSABLE) ×1 IMPLANT

## 2022-07-04 NOTE — Interval H&P Note (Signed)
History and Physical Interval Note:  07/04/2022 9:49 AM  Jeffery Gross  has presented today for surgery, with the diagnosis of Osteomyelitis Right Above Knee Amputation.  The various methods of treatment have been discussed with the patient and family. After consideration of risks, benefits and other options for treatment, the patient has consented to  Procedure(s): REVISION RIGHT ABOVE KNEE AMPUTATION (Right) as a surgical intervention.  The patient's history has been reviewed, patient examined, no change in status, stable for surgery.  I have reviewed the patient's chart and labs.  Questions were answered to the patient's satisfaction.     Nadara Mustard

## 2022-07-04 NOTE — Op Note (Signed)
07/04/2022  3:22 PM  PATIENT:  Jeffery Gross    PRE-OPERATIVE DIAGNOSIS:  Osteomyelitis Right Above Knee Amputation  POST-OPERATIVE DIAGNOSIS:  Same  PROCEDURE:  REVISION RIGHT ABOVE KNEE AMPUTATION Application of Kerecis micro 95 cmto cover surface area of over 200 cm. Application of circumferential wound VAC.  SURGEON:  Nadara Mustard, MD  PHYSICIAN ASSISTANT:None ANESTHESIA:   General  PREOPERATIVE INDICATIONS:  Jeffery Gross is a  61 y.o. male with a diagnosis of Osteomyelitis Right Above Knee Amputation who failed conservative measures and elected for surgical management.    The risks benefits and alternatives were discussed with the patient preoperatively including but not limited to the risks of infection, bleeding, nerve injury, cardiopulmonary complications, the need for revision surgery, among others, and the patient was willing to proceed.  OPERATIVE IMPLANTS: Kerecis micro 95 cm.  @ENCIMAGES @  OPERATIVE FINDINGS: Patient had good petechial bleeding.  Tissue margins were clear.  OPERATIVE PROCEDURE: Patient was brought the operating room and underwent general anesthetic.  After adequate levels anesthesia were obtained patient's right lower extremity was prepped using DuraPrep draped into a sterile field a timeout was called.  A fishmouth incision was made around the ulcerative tissue this was carried sharply down to bone.  The distal 2 cm of bone was resected.  The wound was irrigated with normal saline vascular bundle was suture-ligated with 2-0 silk.  The wound was over 200 cm and  And this was covered with 95 cm of Kerecis micro.  The fascial layer was closed over the bone with #1 Vicryl skin was closed using 2-0 nylon.  A circumferential Prevena wound VAC was applied this had a good suction fit patient was extubated taken the PACU in stable condition.   DISCHARGE PLANNING:  Antibiotic duration: 24 hours antibiotics  Weightbearing: Nonweightbearing on the  right  Pain medication: Opioid pathway  Dressing care/ Wound VAC: Continue wound VAC for 1 week  Ambulatory devices: Walker or crutches   Discharge to: Anticipate discharge home.  Follow-up: In the office 1 week post operative.

## 2022-07-04 NOTE — Transfer of Care (Signed)
Immediate Anesthesia Transfer of Care Note  Patient: FARZAD TIBBETTS  Procedure(s) Performed: REVISION RIGHT ABOVE KNEE AMPUTATION (Right: Leg Upper)  Patient Location: PACU  Anesthesia Type:General  Level of Consciousness: awake, drowsy, patient cooperative, and responds to stimulation  Airway & Oxygen Therapy: Patient Spontanous Breathing and Patient connected to face mask oxygen  Post-op Assessment: Report given to RN and Post -op Vital signs reviewed and stable  Post vital signs: Reviewed and stable  Last Vitals:  Vitals Value Taken Time  BP    Temp    Pulse 67 07/04/22 1130  Resp 0 07/04/22 1130  SpO2 99 % 07/04/22 1130  Vitals shown include unvalidated device data.  Last Pain:  Vitals:   07/04/22 0856  TempSrc:   PainSc: 0-No pain         Complications: No notable events documented.

## 2022-07-04 NOTE — Plan of Care (Signed)

## 2022-07-04 NOTE — Anesthesia Postprocedure Evaluation (Signed)
Anesthesia Post Note  Patient: ELISA SORLIE  Procedure(s) Performed: REVISION RIGHT ABOVE KNEE AMPUTATION (Right: Leg Upper)     Patient location during evaluation: PACU Anesthesia Type: General Level of consciousness: awake and alert Pain management: pain level controlled Vital Signs Assessment: post-procedure vital signs reviewed and stable Respiratory status: spontaneous breathing, nonlabored ventilation, respiratory function stable and patient connected to nasal cannula oxygen Cardiovascular status: blood pressure returned to baseline and stable Postop Assessment: no apparent nausea or vomiting Anesthetic complications: no  No notable events documented.  Last Vitals:  Vitals:   07/04/22 1315 07/04/22 1345  BP: (!) 161/86 (!) 129/57  Pulse: 64 61  Resp: 15 10  Temp:    SpO2: 91% 90%    Last Pain:  Vitals:   07/04/22 1345  TempSrc:   PainSc: Asleep                 Trevor Iha

## 2022-07-04 NOTE — Anesthesia Procedure Notes (Signed)
Procedure Name: LMA Insertion Date/Time: 07/04/2022 10:39 AM  Performed by: Ayesha Rumpf, CRNAPre-anesthesia Checklist: Patient identified, Emergency Drugs available, Suction available and Patient being monitored Patient Re-evaluated:Patient Re-evaluated prior to induction Oxygen Delivery Method: Circle System Utilized Preoxygenation: Pre-oxygenation with 100% oxygen Induction Type: IV induction Ventilation: Mask ventilation without difficulty LMA: LMA inserted LMA Size: 5.0 Number of attempts: 1 Airway Equipment and Method: Bite block Placement Confirmation: positive ETCO2 Tube secured with: Tape Dental Injury: Teeth and Oropharynx as per pre-operative assessment

## 2022-07-04 NOTE — H&P (Signed)
Jeffery Gross is an 61 y.o. male.   Chief Complaint: Dehiscence right above-knee amputation. HPI: Patient is a 61 year old gentleman who is seen for evaluation for cellulitis and ulceration right transtibial amputation patient states he has had purulent drainage as well as yellow fluid. Patient complains of pain and redness over the residual limb. Patient has been on doxycycline without improvement.   Past Medical History:  Diagnosis Date   Back pain    Chest pain    Depression    DVT (deep venous thrombosis) (HCC)    GERD (gastroesophageal reflux disease)    History of kidney stones    passed   HTN (hypertension)    no longer has HTN    Past Surgical History:  Procedure Laterality Date   AMPUTATION Bilateral 01/09/2016   Procedure: Bilateral Above Knee Amputation, Apply Wound VAC;  Surgeon: Nadara Mustard, MD;  Location: [facility] OR;  Service: Orthopedics;  Laterality: Bilateral;   FASCIOTOMY Bilateral 01/03/2016   Procedure: FASCIOTOMY;  Surgeon: Tarry Kos, MD;  Location: [facility] OR;  Service: Orthopedics;  Laterality: Bilateral;   I & D EXTREMITY Bilateral 01/05/2016   Procedure: IRRIGATION AND DEBRIDEMENT BILATERAL LOWER EXTREMITIES; WOUND VAC CHANGE;  Surgeon: Tarry Kos, MD;  Location: [facility];  Service: Orthopedics;  Laterality: Bilateral;   I & D EXTREMITY Left 01/07/2016   Procedure: Irrigation and Debridement of Left Lower Extremity with Application of Wound Vac;  Surgeon: Tarry Kos, MD;  Location: [facility] OR;  Service: Orthopedics;  Laterality: Left;   VASECTOMY      Family History  Problem Relation Age of Onset   Hypertension Father    Social History:  reports that he has been smoking cigarettes. He has a 1.25 pack-year smoking history. He uses smokeless tobacco. He reports current alcohol use. He reports that he does not currently use drugs.  Allergies:  Allergies  Allergen Reactions   Sulfacetamide Sodium Other (See Comments)    edema    No medications prior to admission.     No results found for this or any previous visit (from the past 48 hour(s)). No results found.  Review of Systems  All other systems reviewed and are negative.   There were no vitals taken for this visit. Physical Exam  Patient is alert, oriented, no adenopathy, well-dressed, normal affect, normal respiratory effort. Examination patient has a well-healed left above-knee amputation.  Examination the right above-the-knee amputation shows an area of cellulitis of approximately 5 cm in circumference over the residual limb this is tender to palpation.  Patient has an ulcer with clear drainage.  The ulcer was probed and this probes down to the distal femur. Assessment/Plan 1. S/P AKA (above knee amputation) bilateral (HCC)       Plan: With the exposed bone and cellulitis have recommended proceeding with a right above-the-knee amputation revision.  Will proceed as outpatient surgery discharge to home with a Prevena wound VAC.   Nadara Mustard, MD 07/04/2022, 6:53 AM

## 2022-07-05 ENCOUNTER — Encounter (HOSPITAL_COMMUNITY): Payer: Self-pay | Admitting: Orthopedic Surgery

## 2022-07-05 DIAGNOSIS — Z86718 Personal history of other venous thrombosis and embolism: Secondary | ICD-10-CM | POA: Diagnosis not present

## 2022-07-05 DIAGNOSIS — F17210 Nicotine dependence, cigarettes, uncomplicated: Secondary | ICD-10-CM | POA: Diagnosis present

## 2022-07-05 DIAGNOSIS — Z882 Allergy status to sulfonamides status: Secondary | ICD-10-CM | POA: Diagnosis not present

## 2022-07-05 DIAGNOSIS — T8130XA Disruption of wound, unspecified, initial encounter: Secondary | ICD-10-CM | POA: Diagnosis present

## 2022-07-05 DIAGNOSIS — Y835 Amputation of limb(s) as the cause of abnormal reaction of the patient, or of later complication, without mention of misadventure at the time of the procedure: Secondary | ICD-10-CM | POA: Diagnosis present

## 2022-07-05 DIAGNOSIS — L03115 Cellulitis of right lower limb: Secondary | ICD-10-CM | POA: Diagnosis present

## 2022-07-05 DIAGNOSIS — Z8249 Family history of ischemic heart disease and other diseases of the circulatory system: Secondary | ICD-10-CM | POA: Diagnosis not present

## 2022-07-05 DIAGNOSIS — T879 Unspecified complications of amputation stump: Secondary | ICD-10-CM | POA: Diagnosis present

## 2022-07-05 DIAGNOSIS — L97119 Non-pressure chronic ulcer of right thigh with unspecified severity: Secondary | ICD-10-CM | POA: Diagnosis present

## 2022-07-05 DIAGNOSIS — K219 Gastro-esophageal reflux disease without esophagitis: Secondary | ICD-10-CM | POA: Diagnosis present

## 2022-07-05 DIAGNOSIS — T8781 Dehiscence of amputation stump: Secondary | ICD-10-CM | POA: Diagnosis present

## 2022-07-05 DIAGNOSIS — M868X5 Other osteomyelitis, thigh: Secondary | ICD-10-CM | POA: Diagnosis present

## 2022-07-05 NOTE — Evaluation (Signed)
Physical Therapy Evaluation Patient Details Name: Jeffery Gross MRN: [id number] DOB: 1960-11-26 Today's Date: 07/05/2022  History of Present Illness  Pt is 61 yo male who presents on 07/04/22 for revision of R AKA due to osteomyelitis. PMH: B AKA 01/09/16, HTN, GERD, back pain, depression, DVT, smoker  Clinical Impression  Pt admitted with above diagnosis. PT eval limited today by increased pain. Pt able to sit up in bed and was able to tolerate full supine for hip ext stretching but was not able to tolerate transfer to chair. Pt is from home with parents and his father needs a lot of care so he does not have help for himself. His limitation right now is that his prostheses are not fitting properly and he has not had transportation to get to prosthetist so cannot put them on. Will message CSW for potential assist with this. Expect pt will be able to resume lateral transfers and d/c home in w/c once pain better controlled.  Pt currently with functional limitations due to the deficits listed below (see PT Problem List). Pt will benefit from skilled PT to increase their independence and safety with mobility to allow discharge to the venue listed below.          Recommendations for follow up therapy are one component of a multi-disciplinary discharge planning process, led by the attending physician.  Recommendations may be updated based on patient status, additional functional criteria and insurance authorization.  Follow Up Recommendations Other (comment) (f/u with prosthetist)      Assistance Recommended at Discharge Intermittent Supervision/Assistance  Patient can return home with the following  Assistance with cooking/housework    Equipment Recommendations None recommended by PT  Recommendations for Other Services       Functional Status Assessment Patient has had a recent decline in their functional status and demonstrates the ability to make significant improvements in function in a  reasonable and predictable amount of time.     Precautions / Restrictions Restrictions Weight Bearing Restrictions: Yes RLE Weight Bearing: Non weight bearing LLE Weight Bearing: Non weight bearing Other Position/Activity Restrictions: wound vac R      Mobility  Bed Mobility Overal bed mobility: Modified Independent             General bed mobility comments: able to pull self up into long sitting. Placed bed in flat position x5 mins to stretch L hip, painful at first but tolerated.    Transfers                   General transfer comment: unable to tolerate today due to pain    Ambulation/Gait               General Gait Details: unable, prostheses not fitting properly  Stairs            Wheelchair Mobility    Modified Rankin (Stroke Patients Only)       Balance Overall balance assessment: No apparent balance deficits (not formally assessed) (in sitting)                                           Pertinent Vitals/Pain Pain Assessment Pain Assessment: 0-10 Pain Score: 10-Worst pain ever Pain Location: RLE Pain Descriptors / Indicators: Burning, Aching, Guarding, Grimacing Pain Intervention(s): Limited activity within patient's tolerance, Monitored during session, Patient requesting pain meds-RN notified  Home Living Family/patient expects to be discharged to:: Private residence Living Arrangements: Parent (lives with mom) Available Help at Discharge: Other (Comment) (limited assistance available) Type of Home: House Home Access: Stairs to enter   [employer] of Steps: 5   Home Layout: One level Home Equipment: Agricultural consultant (2 wheels);Wheelchair - manual Additional Comments: lives with mom and dad but dad needs care so no one can help pt    Prior Function Prior Level of Function : Independent/Modified Independent             Mobility Comments: independent with use of B prostheses but they don't  fit currently and he has not had a way to get to the prosthetist ADLs Comments: independent     Hand Dominance        Extremity/Trunk Assessment   Upper Extremity Assessment Upper Extremity Assessment: Overall WFL for tasks assessed    Lower Extremity Assessment Lower Extremity Assessment: RLE deficits/detail;LLE deficits/detail RLE Deficits / Details: unable to elevate RLE today due to pain. Reviewed amp exercises including SLR, hip abd/add, hip ext for when pt can tolerate. Also advised to perform on L to maintain strength while not wearing his prostheses RLE: Unable to fully assess due to pain RLE Sensation: decreased light touch RLE Coordination: decreased gross motor LLE Deficits / Details: AKA, hip strength WFL    Cervical / Trunk Assessment Cervical / Trunk Assessment: Normal  Communication   Communication: No difficulties  Cognition Arousal/Alertness: Awake/alert Behavior During Therapy: WFL for tasks assessed/performed Overall Cognitive Status: Within Functional Limits for tasks assessed                                          General Comments General comments (skin integrity, edema, etc.): pt very hot and sweating profusely, VSS have been stable. Brought him an ice pack and cold water    Exercises     Assessment/Plan    PT Assessment Patient needs continued PT services  PT Problem List Pain;Decreased mobility;Decreased strength;Decreased range of motion       PT Treatment Interventions DME instruction;Functional mobility training;Therapeutic activities;Therapeutic exercise;Balance training;Patient/family education    PT Goals (Current goals can be found in the Care Plan section)  Acute Rehab PT Goals Patient Stated Goal: return home, get to prosthetist PT Goal Formulation: With patient Time For Goal Achievement: 07/19/22 Potential to Achieve Goals: Good    Frequency Min 2X/week     Co-evaluation               AM-PAC PT "6  Clicks" Mobility  Outcome Measure Help needed turning from your back to your side while in a flat bed without using bedrails?: None Help needed moving from lying on your back to sitting on the side of a flat bed without using bedrails?: None Help needed moving to and from a bed to a chair (including a wheelchair)?: A Little Help needed standing up from a chair using your arms (e.g., wheelchair or bedside chair)?: Total Help needed to walk in hospital room?: Total Help needed climbing 3-5 steps with a railing? : Total 6 Click Score: 14    End of Session   Activity Tolerance: Patient limited by pain Patient left: in bed;with call bell/phone within reach Nurse Communication: Mobility status PT Visit Diagnosis: Pain Pain - Right/Left: Right Pain - part of body: Leg    Time: 4431-5400 PT Time  Calculation (min) (ACUTE ONLY): 19 min   Charges:   PT Evaluation $PT Eval Moderate Complexity: 1 Mod          Lyanne Co, PT  Acute Rehab Services Secure chat preferred Office [phone]   Elyse Hsu 07/05/2022, 12:53 PM

## 2022-07-05 NOTE — Progress Notes (Signed)
Inpatient Rehab Admissions Coordinator:  Consult received. Note pt is observation status at this time. Pt may not have the medical necessity to warrant an inpatient rehab stay if they remain observation. If status were to change to inpatient, admissions team will assess for candidacy.   Wolfgang Phoenix, MS, CCC-SLP Admissions Coordinator [phone]

## 2022-07-05 NOTE — Evaluation (Signed)
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 Pt was able to complete an anterior-posterior transfer into chair at this time with supervision cues with just cues on how to complete. Pt self reports they have not had a new WC in over 7 years but not sure on the width of doors to be able to enter into bathroom with WC. If pt is unable to enter bathroom with WC they may need a drop down BSC for toilet tasks. Pt currently with functional limitations due to the deficits listed below (see OT Problem List).  Pt will benefit from skilled OT to increase their safety and independence with ADL and functional mobility for ADL to facilitate discharge to venue listed below.        Recommendations for follow up therapy are one component of a multi-disciplinary discharge planning process, led by the attending physician.  Recommendations may be updated based on patient status, additional functional criteria and insurance authorization.   Follow Up Recommendations  Follow physician's recommendations for discharge plan and follow up therapies     Assistance Recommended at Discharge Intermittent Supervision/Assistance  Patient can return home with the following A little help with walking and/or transfers;A little help with bathing/dressing/bathroom;Assist for transportation    Functional Status Assessment  Patient has had a recent decline in their functional status and demonstrates the ability to make significant improvements in function in a reasonable and predictable amount of time.  Equipment Recommendations  Wheelchair (measurements OT);Wheelchair cushion  (measurements OT) (Pt at this time reporting that they have not had a new WC in over 7 years and may need drop down commode)    Recommendations for Other Services       Precautions / Restrictions Restrictions Weight Bearing Restrictions: Yes RLE Weight Bearing: Non weight bearing LLE Weight Bearing: Non weight bearing Other Position/Activity Restrictions: wound vac R      Mobility Bed Mobility Overal bed mobility: Modified Independent                  Transfers Overall transfer level: Needs assistance                 General transfer comment: anterior-posterior transfer to chair      Balance Overall balance assessment: Needs assistance Sitting-balance support: Bilateral upper extremity supported, No upper extremity supported Sitting balance-Leahy Scale: Fair Sitting balance - Comments: able to long sit in bed and depending on pain if they need assist Postural control: Posterior lean                                 ADL either performed or assessed with clinical judgement   ADL Overall ADL's : Needs assistance/impaired Eating/Feeding: Independent;Sitting   Grooming: Wash/dry hands;Wash/dry face;Oral care;Set up;Sitting   Upper Body Bathing: Set up;Sitting   Lower Body Bathing: Min guard;Bed level Lower Body Bathing Details (indicate cue type and reason): rolling side to side Upper Body Dressing : Set up;Sitting   Lower Body Dressing: Min guard;Cueing for safety;Cueing for sequencing;Bed level Lower Body Dressing Details (indicate cue type and  reason): rolling side to side Toilet Transfer: Min guard;Requires drop arm   Toileting- Clothing Manipulation and Hygiene: Min guard;Bed level       Functional mobility during ADLs:  (WC level at this time)       Vision         Perception     Praxis      Pertinent Vitals/Pain Pain Assessment Pain Assessment: 0-10 Pain Score: 8  Pain Location: RLE Pain Descriptors / Indicators:  Aching, Discomfort, Grimacing, Guarding Pain Intervention(s): Limited activity within patient's tolerance, Monitored during session, Repositioned, Patient requesting pain meds-RN notified     Hand Dominance Right   Extremity/Trunk Assessment Upper Extremity Assessment Upper Extremity Assessment: Overall WFL for tasks assessed   Lower Extremity Assessment Lower Extremity Assessment: Defer to PT evaluation RLE Deficits / Details: unable to elevate RLE today due to pain. Reviewed amp exercises including SLR, hip abd/add, hip ext for when pt can tolerate. Also advised to perform on L to maintain strength while not wearing his prostheses RLE: Unable to fully assess due to pain RLE Sensation: decreased light touch RLE Coordination: decreased gross motor LLE Deficits / Details: AKA, hip strength WFL   Cervical / Trunk Assessment Cervical / Trunk Assessment: Normal   Communication Communication Communication: No difficulties   Cognition Arousal/Alertness: Awake/alert Behavior During Therapy: WFL for tasks assessed/performed Overall Cognitive Status: Within Functional Limits for tasks assessed                                       General Comments  pt very hot and sweating profusely, VSS have been stable. Brought him an ice pack and cold water    Exercises     Shoulder Instructions      Home Living Family/patient expects to be discharged to:: Private residence Living Arrangements: Parent Available Help at Discharge: Other (Comment) (limited assistance available) Type of Home: House Home Access: Ramped entrance Entrance Stairs-Number of Steps: 5   Home Layout: One level     Bathroom Shower/Tub: Chief Strategy Officer: Standard Bathroom Accessibility: Yes How Accessible: Accessible via wheelchair Home Equipment: Rolling Walker (2 wheels);Wheelchair - manual;BSC/3in1   Additional Comments: lives with mom and dad but dad needs care so no one can  help pt. Pt reported equipment they have is more than 61 years old      Prior Functioning/Environment Prior Level of Function : Independent/Modified Independent             Mobility Comments: independent with use of B prostheses but they don't fit currently and he has not had a way to get to the prosthetist ADLs Comments: independent        OT Problem List: Decreased activity tolerance;Decreased strength;Decreased safety awareness;Decreased knowledge of use of DME or AE;Cardiopulmonary status limiting activity;Pain      OT Treatment/Interventions: Self-care/ADL training;DME and/or AE instruction;Therapeutic activities;Patient/family education;Balance training    OT Goals(Current goals can be found in the care plan section) Acute Rehab OT Goals Patient Stated Goal: to get home OT Goal Formulation: With patient Time For Goal Achievement: 07/19/22 Potential to Achieve Goals: Good ADL Goals Pt Will Perform Lower Body Bathing: with modified independence;sitting/lateral leans Pt Will Perform Lower Body Dressing: with modified independence;sitting/lateral leans Pt Will Transfer to Toilet: with supervision Additional ADL Goal #1: Pt will be able to sit OOB for 3 hours to be able to resume WC level  at home  OT Frequency: Min 2X/week    Co-evaluation              AM-PAC OT "6 Clicks" Daily Activity     Outcome Measure Help from another person eating meals?: None Help from another person taking care of personal grooming?: None Help from another person toileting, which includes using toliet, bedpan, or urinal?: A Little Help from another person bathing (including washing, rinsing, drying)?: A Little Help from another person to put on and taking off regular upper body clothing?: None Help from another person to put on and taking off regular lower body clothing?: A Little 6 Click Score: 21   End of Session Nurse Communication: Mobility status  Activity Tolerance: Patient  tolerated treatment well Patient left: in chair;with call bell/phone within reach;with chair alarm set  OT Visit Diagnosis: Unsteadiness on feet (R26.81);Other abnormalities of gait and mobility (R26.89);Muscle weakness (generalized) (M62.81);Pain Pain - Right/Left: Right Pain - part of body: Leg                Time: 1332-1415 OT Time Calculation (min): 43 min Charges:  OT General Charges $OT Visit: 1 Visit OT Evaluation $OT Eval Low Complexity: 1 Low OT Treatments $Self Care/Home Management : 23-37 mins  Alphia Moh OTR/L  Acute Rehab Services  [phone] office number    Alphia Moh 07/05/2022, 2:25 PM

## 2022-07-05 NOTE — Progress Notes (Signed)
Patient ID: Jeffery Gross, male   DOB: 07-Jul-1961, 61 y.o.   MRN: [id number] Patient is postoperative day 1 revision right above-the-knee amputation.  The wound VAC was not functioning and not plugged in.  This was plugged in and did have a good suction fit.  Patient is complaining of increasing pain we will continue inpatient until pain is under control.

## 2022-07-06 MED ORDER — CHLORHEXIDINE GLUCONATE CLOTH 2 % EX PADS
6.0000 | MEDICATED_PAD | Freq: Every day | CUTANEOUS | Status: DC
  Administered 2022-07-06 – 2022-07-08 (×3): 6 via TOPICAL

## 2022-07-06 NOTE — Progress Notes (Signed)
Inpatient Rehab Admissions Coordinator:  Note pt therapy is not recommending CIR. Pt appears to not warrant the intensity of CIR. TOC made aware.   Wolfgang Phoenix, MS, CCC-SLP Admissions Coordinator [phone]

## 2022-07-06 NOTE — Progress Notes (Signed)
  Subjective: Patient stable.  Right AKA Prevena wound VAC is functional.  Pain is controlled.   Objective: Vital signs in last 24 hours: Temp:  [98.2 F (36.8 C)-98.5 F (36.9 C)] 98.3 F (36.8 C) (12/17 0512) Pulse Rate:  [74-85] 74 (12/17 0512) Resp:  [16-19] 18 (12/17 0512) BP: (114-125)/(62-71) 120/69 (12/17 0512) SpO2:  [91 %-98 %] 91 % (12/17 0512)  Intake/Output from previous day: 12/16 0701 - 12/17 0700 In: 2370 [P.O.:1320; I.V.:1050] Out: 2100 [Urine:2100] Intake/Output this shift: No intake/output data recorded.  Exam:  No cellulitis present  Labs: Recent Labs    07/04/22 0826  HGB 14.5   Recent Labs    07/04/22 0826  WBC 12.4*  RBC 5.06  HCT 42.5  PLT 229   Recent Labs    07/04/22 0826  NA 138  K 4.0  CL 102  CO2 26  BUN 20  CREATININE 1.09  GLUCOSE 91  CALCIUM 9.4   No results for input(s): "LABPT", "INR" in the last 72 hours.  Assessment/Plan: Plan is transfers per physical therapy and skilled nursing placement once wound issues have resolved.   Jeffery Gross Tashai Catino 07/06/2022, 8:49 AM

## 2022-07-06 NOTE — Plan of Care (Signed)

## 2022-07-07 NOTE — Progress Notes (Signed)
Patient ID: Jeffery Gross, male   DOB: 1961/01/27, 61 y.o.   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  Anticipate patient can discharge to home tomorrow.  Patient requests Dilaudid oral for discharge.  Continue with the wound VAC at discharge.

## 2022-07-07 NOTE — Plan of Care (Signed)
  Problem: Education: Goal: Knowledge of the prescribed therapeutic regimen will improve Outcome: Progressing Goal: Ability to verbalize activity precautions or restrictions will improve Outcome: Progressing Goal: Understanding of discharge needs will improve Outcome: Progressing   

## 2022-07-07 NOTE — Progress Notes (Addendum)
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 Pt appearing more groggy once up in chair due to pain meds taking effect, reviewed prone positioning, pain mgmt strategies and HEP but pt defers to perform HEP due to fatigue. Pt reports moderate pain. Of note, discussed with supervising PT pt reports his wheelchair is not correct size and needs to be replaced (it is >21 year old), pt will need social work consult for this and may need a ramp installed. Unclear if he will be able to get up stairs into home since his prosthetic is not in hospital and reports it has been ill fitting (LLE prosthesis). He may need PTAR to get up 5 steps into home vs family installing a ramp for him. Per eval note, he has limited support at home. Pt continues to benefit from PT services to progress toward functional mobility goals.    Recommendations for follow up therapy are one component of a multi-disciplinary discharge planning process, led by the attending physician.  Recommendations may be updated based on patient status, additional functional criteria and insurance authorization.  Follow Up Recommendations  Other (comment) (f/u with prosthetist for LLE prosthetic not fitting?)     Assistance Recommended at Discharge Frequent or constant Supervision/Assistance  Patient can return home with the following Assistance with cooking/housework;Help with stairs or ramp for entrance   Equipment Recommendations  Wheelchair (measurements PT);Wheelchair cushion (measurements  PT);Other (comment) (consider slide board if LLE prosthetic not fitting) May need ramp installed?   Recommendations for Other Services       Precautions / Restrictions Precautions Precautions: Fall Precaution Comments: was a bit groggy as pain meds kicked in Restrictions Weight Bearing Restrictions: Yes RLE Weight Bearing: Non weight bearing LLE Weight Bearing:  (AKA) Other Position/Activity Restrictions: wound vac R     Mobility  Bed Mobility Overal bed mobility: Modified Independent             General bed mobility comments: able to pull self up into long sitting using UE/bed features    Transfers Overall transfer level: Needs assistance Equipment used: None Transfers: Bed to chair/wheelchair/BSC         Anterior-Posterior transfers: Min assist   General transfer comment: Posterior seated scoot transfer from bed>chair. Min cues for safe UE placement and minA lift assist at UE/hips when pt going from more plush bed surface up onto harder chair surface. Drop arm chair obtained for his room prior to tf in case RN/NT need to assist him if anterior scooting becomes too difficult, pt has option for lateral scoot with bed pad assist, RN notified/aware.    Ambulation/Gait               General Gait Details: unable, prosthesis not fitting properly      Balance Overall balance assessment: Needs assistance Sitting-balance support: Bilateral upper extremity supported, No upper extremity supported Sitting balance-Leahy Scale: Fair Sitting balance - Comments: able to long sit in bed with single UE support no LOB  Cognition Arousal/Alertness: Suspect due to medications (initially alert, becoming slightly lethargic/groggy) Behavior During Therapy: WFL for tasks assessed/performed Overall Cognitive Status: Within Functional Limits for tasks assessed                                 General Comments: Pt  initially alert/impulsive, but became more groggy with fatigue (he had PO meds just prior to session so likely due to meds).        Exercises Amputee Exercises Chair Push Up: Other (comment) (verbal/visual demo) Other Exercises Other Exercises: reviewed prone positioning technique/frequency, chair push-ups, hip abduction and extension exercises, pt would benefit from handout next session    General Comments General comments (skin integrity, edema, etc.): VSS per chart review, no acute s/sx distress, just a bit groggy, RN notified      Pertinent Vitals/Pain Pain Assessment Pain Assessment: Faces Faces Pain Scale: Hurts little more Pain Location: RLE Pain Descriptors / Indicators: Discomfort, Guarding Pain Intervention(s): Monitored during session, Repositioned, Premedicated before session, Limited activity within patient's tolerance, Other (comment) (a bit groggy once up in chair; had pain meds ~10 mins prior)           PT Goals (current goals can now be found in the care plan section) Acute Rehab PT Goals Patient Stated Goal: return home, get to prosthetist PT Goal Formulation: With patient Time For Goal Achievement: 07/19/22 Progress towards PT goals: Progressing toward goals    Frequency    Min 2X/week      PT Plan Current plan remains appropriate       AM-PAC PT "6 Clicks" Mobility   Outcome Measure  Help needed turning from your back to your side while in a flat bed without using bedrails?: None Help needed moving from lying on your back to sitting on the side of a flat bed without using bedrails?: None Help needed moving to and from a bed to a chair (including a wheelchair)?: A Little Help needed standing up from a chair using your arms (e.g., wheelchair or bedside chair)?: Total Help needed to walk in hospital room?: Total Help needed climbing 3-5 steps with a railing? : Total 6 Click Score: 14    End of Session   Activity Tolerance: Patient limited by  fatigue;Other (comment) (pain meds taking effect, pt somewhat groggy) Patient left: in chair;with call bell/phone within reach;with chair alarm set Nurse Communication: Mobility status;Precautions;Other (comment) (needs social work consult likely to need WC, he also may need a ramp installed?) PT Visit Diagnosis: Pain Pain - Right/Left: Right Pain - part of body: Leg     Time: 1755-1816 PT Time Calculation (min) (ACUTE ONLY): 21 min  Charges:  $Therapeutic Activity: 8-22 mins                     Shakendra Griffeth P., PTA Acute Rehabilitation Services Secure Chat Preferred 9a-5:30pm Office: [phone]    Dorathy Kinsman [facility] 07/07/2022, 7:07 PM

## 2022-07-08 MED ORDER — HYDROMORPHONE HCL 2 MG PO TABS: 2.0000 mg | ORAL_TABLET | Freq: Four times a day (QID) | ORAL | 0 refills | Status: DC | PRN

## 2022-07-08 NOTE — TOC Transition Note (Signed)
Transition of Care [facility]) - CM/SW Discharge Note   Patient Details  Name: MATTHEU BRODERSEN MRN: [id number] Date of Birth: 05/16/1961  Transition of Care [facility]) CM/SW Contact:  Epifanio Lesches, RN Phone Number: 07/08/2022, 4:18 PM   Clinical Narrative:    Patient will DC to: Home Anticipated DC date:  07/02/2022 Family notified: yes Transport by: car                - s/p revision of  R BKA Per MD patient ready for DC today. RN, patient, and patient's family notified of DC. Pt resides with mom. Has family to assist with care once d/c. DME needs noted for W/C and sliding board. Pt agreeable of need. Referral made with [employer]. Equipment will be delivered to bedside prior to d/c. Pt without Rx med concerns. Post hospital f/u noted on AVS. Son to provide transportation to home.  RNCM will sign off for now as intervention is no longer needed. Please consult [place] again if new needs arise.   Final next level of care: Home/Self Care Barriers to Discharge: No Barriers Identified   Patient Goals and CMS Choice        Discharge Placement                       Discharge Plan and Services                DME Arranged: Other see comment (sliding board), W/C DME Agency: [facility] Date DME Agency Contacted: 07/08/22 Time DME Agency Contacted: [phone] Representative spoke with at DME Agency: [employer]            Social Determinants of Health (SDOH) Interventions     Readmission Risk Interventions     No data to display

## 2022-07-08 NOTE — Progress Notes (Signed)
OT Cancellation Note  Patient Details Name: Jeffery Gross MRN: [id number] DOB: 11/04/1960   Cancelled Treatment:    Reason Eval/Treat Not Completed: Other (comment) (working with PT currently). Plan to reattempt.  Raynald Kemp, OT Acute Rehabilitation Services Office: [phone]   Pilar Grammes 07/08/2022, 12:55 PM

## 2022-07-08 NOTE — Progress Notes (Signed)
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 Pt needing up to minA for posterior then anterior scooting to/from chair with bed pad assist. Pt without LOB, needs min cues for improved UE placement. Pt queried on plan for transfer to/from his car upon DC and he states he doesn't have access to his slide board as it is in storage and that his son won't be able to get it from storage for him. Case mgmt notified pt needs slide board as well. Pt continues to benefit from PT services to progress toward functional mobility goals.    Recommendations for follow up therapy are one component of a multi-disciplinary discharge planning process, led by the attending physician.  Recommendations may be updated based on patient status, additional functional criteria and insurance authorization.  Follow Up Recommendations  Other (comment) (f/u with prosthetist for LLE prosthetic not fitting?)     Assistance Recommended at Discharge Frequent or constant Supervision/Assistance  Patient can return home with the following Assistance with cooking/housework;Help with stairs or ramp for entrance   Equipment Recommendations  Wheelchair (measurements PT);Wheelchair cushion (measurements PT);Other (comment) (B AKA style wheelchair wtih anti-tippers and removable arm/leg rests.16" to fit through doorways; consider slide board if LLE prosthetic not fitting for car transfers, pt now stating that his SB is in storage and no one can get it out for him)    Recommendations for Other Services        Precautions / Restrictions Precautions Precautions: Fall Precaution Comments: very literal Restrictions Weight Bearing Restrictions: Yes RLE Weight Bearing: Non weight bearing LLE Weight Bearing:  (AKA) Other Position/Activity Restrictions: now with R limb ace wrapped     Mobility  Bed Mobility Overal bed mobility: Modified Independent             General bed mobility comments: able to pull self up into long sitting using UE/bed features    Transfers Overall transfer level: Needs assistance Equipment used: None Transfers: Bed to chair/wheelchair/BSC         Anterior-Posterior transfers: Min assist   General transfer comment: Posterior seated scoot from slightly elevated bed surface to pt's new wheelchair and from wheelchair back to the bed, used pillow in few inch gap from chair to bed surface to protect his skin from edge of seat and discussed use of slide board for car transfers, pt reports his is in storage and his son won't have access to get it for him.    Ambulation/Gait               General Gait Details: unable, prosthesis not fitting properly per his report   Stairs             Wheelchair Mobility  Pt instructed on use of brakes pre/post transfers and receptive. Chair cushion opened and placed on chair and secured with buckles for pt safety prior to DC.   Modified Rankin (Stroke Patients Only)       Balance Overall balance assessment: Needs assistance Sitting-balance support: Bilateral upper extremity supported, No upper extremity  supported Sitting balance-Leahy Scale: Fair Sitting balance - Comments: able to long sit in bed with single UE support no LOB                                    Cognition Arousal/Alertness: Awake/alert Behavior During Therapy: WFL for tasks assessed/performed Overall Cognitive Status: No family/caregiver present to determine baseline cognitive functioning                                  General Comments: cooperative, following simple commands well; tangential, he is motivated to progress.        Exercises      General Comments General comments (skin integrity, edema, etc.): reviewed prone positioning technique/frequency, new phone obtained for his room as previous one was not ringing, his # written on the board so he can tell family/providers if they need to call him back.      Pertinent Vitals/Pain Pain Assessment Pain Assessment: Faces Faces Pain Scale: Hurts a little bit Pain Location: RLE Pain Descriptors / Indicators: Guarding Pain Intervention(s): Monitored during session, Repositioned     PT Goals (current goals can now be found in the care plan section) Acute Rehab PT Goals Patient Stated Goal: return home, get to prosthetist PT Goal Formulation: With patient Time For Goal Achievement: 07/19/22 Progress towards PT goals: Progressing toward goals    Frequency    Min 2X/week      PT Plan Current plan remains appropriate       AM-PAC PT "6 Clicks" Mobility   Outcome Measure  Help needed turning from your back to your side while in a flat bed without using bedrails?: None Help needed moving from lying on your back to sitting on the side of a flat bed without using bedrails?: None Help needed moving to and from a bed to a chair (including a wheelchair)?: A Little Help needed standing up from a chair using your arms (e.g., wheelchair or bedside chair)?: Total Help needed to walk in hospital room?: Total Help needed climbing 3-5 steps with a railing? : Total 6 Click Score: 14    End of Session   Activity Tolerance: Patient tolerated treatment well Patient left: with call bell/phone within reach;in bed;with bed alarm set Nurse Communication: Mobility status;Precautions;Other (comment) (he says his slide board is in storage and his son won't be able to get it for him) PT Visit Diagnosis: Pain Pain - Right/Left: Right Pain -  part of body: Leg     Time: 3545-6256 PT Time Calculation (min) (ACUTE ONLY): 13 min  Charges:  $Therapeutic Activity: 8-22 mins                     Dayle Sherpa P., PTA Acute Rehabilitation Services Secure Chat Preferred 9a-5:30pm Office: [phone]    Dorathy Kinsman [facility] 07/08/2022, 4:08 PM

## 2022-07-08 NOTE — Progress Notes (Cosign Needed)
    Durable Medical Equipment  (From admission, onward)           Start     Ordered   07/08/22 1130  For home use only DME lightweight manual wheelchair with seat cushion  Once       Comments: Patient suffers from  revision of R AKA  which impairs their ability to perform daily activities like bathing in the home.  A walker will not resolve  issue with performing activities of daily living. A wheelchair will allow patient to safely perform daily activities. Patient is not able to propel themselves in the home using a standard weight wheelchair due to general weakness. Patient can self propel in the lightweight wheelchair. Length of need 12 months . Accessories: elevating leg rests (ELRs), wheel locks, extensions and anti-tippers.   07/08/22 1131

## 2022-07-08 NOTE — Progress Notes (Addendum)
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 Pt reports he does need a new wheelchair (details below), case mgmt notified. Pt clarified that he does have steps to get into his home but that he also has a ramp so he does not need to go up the steps to get into the house. Pt limited due to pain with malfunctioning wound vac and defers transfer OOB to [facility] as he was not ready yet, he was able to demo scooting in bed laterally and anterior/posterior without assist today to simulate OOB transfers. RN entering room to remove wound vac and place dressing at end of session. Plan to have pt perform wheelchair mobility next session if pt wheelchair or dept wheelchair available, anticipate he is close to his functional baseline. Pt continues to benefit from PT services to progress toward functional mobility goals.   Recommendations for follow up therapy are one component of a multi-disciplinary discharge planning process, led by the attending physician.  Recommendations may be updated based on patient status, additional functional criteria and insurance authorization.  Follow Up Recommendations  Other (comment) (f/u with prosthetist for LLE prosthetic not fitting?)     Assistance Recommended at Discharge Frequent or constant Supervision/Assistance  Patient can return home with the following Assistance with cooking/housework;Help with stairs or ramp for entrance   Equipment Recommendations  Wheelchair (measurements PT);Wheelchair cushion (measurements PT);Other (comment) (B AKA style wheelchair wtih anti-tippers and  removable arm/leg rests. He may need narrow fitting wheelchair 16 or 18" to fit through doorways and for comfort. consider slide board if LLE prosthetic not fitting for car transfers)    Recommendations for Other Services       Precautions / Restrictions Precautions Precautions: Fall Precaution Comments: very literal Restrictions Weight Bearing Restrictions: Yes RLE Weight Bearing: Non weight bearing LLE Weight Bearing:  (AKA) Other Position/Activity Restrictions: wound vac R, suction not functioning properly     Mobility  Bed Mobility Overal bed mobility: Modified Independent             General bed mobility comments: able to pull self up into long sitting using UE/bed features    Transfers Overall transfer level: Needs assistance Equipment used: None Transfers: Bed to chair/wheelchair/BSC         Anterior-Posterior transfers: Min assist   General transfer comment: Posterior seated scoot and lateral scooting in the bed and frequent repositioning in supine, pt limited due to pain and BSC brought to bedside as he reports he needs to use it "soon", however pt reports not yet. RN entering room at end of session to remove wound vac, pt agreeable to participate in wheelchair mobility instruction if wheelchair delivered to his room today or tomorrow. Discussed safe technique for A/P transfer with nursing staff to/from [facility] and pt will need assist for safety.    Ambulation/Gait               General Gait Details: unable, prosthesis not fitting properly per his report   Optometrist  Modified Rankin (Stroke Patients Only)       Balance Overall balance assessment: Needs assistance Sitting-balance support: Bilateral upper extremity supported, No upper extremity supported Sitting balance-Leahy Scale: Fair Sitting balance - Comments: able to long sit in bed with single UE support no LOB                                     Cognition Arousal/Alertness: Awake/alert Behavior During Therapy: Impulsive, Anxious Overall Cognitive Status: No family/caregiver present to determine baseline cognitive functioning                                 General Comments: Pt with contradictory/inconsistent report of PLOF/home setup, needs increased time/cues to review for DME planning, case mgmt notified he still needs wheelchair after clarification. Pt now reporting that he has a ramp over his stairs so he will be able to get into home without ambulance transport per his report.        Exercises      General Comments General comments (skin integrity, edema, etc.): reviewed prone positioning technique/frequency, new phone obtained for his room as previous one was not ringing, his # written on the board so he can tell family/providers if they need to call him back.      Pertinent Vitals/Pain Pain Assessment Pain Assessment: Faces Faces Pain Scale: Hurts even more Pain Location: RLE Pain Descriptors / Indicators: Discomfort, Guarding, Grimacing Pain Intervention(s): Monitored during session, Repositioned, Limited activity within patient's tolerance, Other (comment) (pt is concerned RN is giving him saline instead of his IV pain meds)     PT Goals (current goals can now be found in the care plan section) Acute Rehab PT Goals Patient Stated Goal: return home, get to prosthetist PT Goal Formulation: With patient Time For Goal Achievement: 07/19/22 Progress towards PT goals: Progressing toward goals    Frequency    Min 2X/week      PT Plan Current plan remains appropriate       AM-PAC PT "6 Clicks" Mobility   Outcome Measure  Help needed turning from your back to your side while in a flat bed without using bedrails?: None Help needed moving from lying on your back to sitting on the side of a flat bed without using bedrails?: None Help needed moving to and from a bed to a chair (including a  wheelchair)?: A Little Help needed standing up from a chair using your arms (e.g., wheelchair or bedside chair)?: Total Help needed to walk in hospital room?: Total Help needed climbing 3-5 steps with a railing? : Total 6 Click Score: 14    End of Session   Activity Tolerance: Patient limited by pain;Other (comment) (RN entering room to do wound care/remove wound vac and place dressing, defer OOB until after this process complete as he reports the wound vac pain is severe during session) Patient left: with call bell/phone within reach;in bed;with nursing/sitter in room;Other (comment) (RN entering room to remove wound vac) Nurse Communication: Mobility status;Precautions;Other (comment) (needs social work consult and wheelchair (B AKA with anti tippers)) PT Visit Diagnosis: Pain Pain - Right/Left: Right Pain - part of body: Leg     Time: 1250-1300 PT Time Calculation (min) (ACUTE ONLY): 10 min  Charges:  $Therapeutic Activity: 8-22 mins  Jeffery Ou., Jeffery Gross Acute Rehabilitation Services Secure Chat Preferred 9a-5:30pm Office: [phone]    Jeffery Gross 07/08/2022, 1:47 PM

## 2022-07-08 NOTE — Progress Notes (Signed)
Patient ID: Jeffery Gross, male   DOB: Dec 10, 1960, 61 y.o.   MRN: [id number] Plan for discharge today.  The wound VAC tube was pulled out last night and the wound VAC is leaking.  Will have the wound VAC discontinued and apply dry dressing.

## 2022-07-08 NOTE — Discharge Summary (Signed)
Discharge Diagnoses:  Principal Problem:   Wound dehiscence Active Problems:   Hx of AKA (above knee amputation), right (HCC)   AKA stump complication ([place])   Surgeries: Procedure(s): REVISION RIGHT ABOVE KNEE AMPUTATION on 07/04/2022    Consultants:   Discharged Condition: Improved  Hospital Course: Jeffery Gross is an 61 y.o. male who was admitted 07/04/2022 with a chief complaint of infected right AKA, with a final diagnosis of Osteomyelitis Right Above Knee Amputation.  Patient was brought to the operating room on 07/04/2022 and underwent Procedure(s): REVISION RIGHT ABOVE KNEE AMPUTATION.    Patient was given perioperative antibiotics:  Anti-infectives (From admission, onward)    Start     Dose/Rate Route Frequency Ordered Stop   07/04/22 1800  ceFAZolin (ANCEF) IVPB 2g/100 mL premix        2 g 200 mL/hr over 30 Minutes Intravenous Every 8 hours 07/04/22 1612 07/04/22 1743   07/04/22 1330  ceFAZolin (ANCEF) IVPB 2g/100 mL premix  Status:  Discontinued        2 g 200 mL/hr over 30 Minutes Intravenous Every 8 hours 07/04/22 1328 07/04/22 1612   07/04/22 0830  ceFAZolin (ANCEF) IVPB 2g/100 mL premix        2 g 200 mL/hr over 30 Minutes Intravenous On call to O.R. 07/04/22 2637 07/04/22 1035     .  Patient was given sequential compression devices, early ambulation, and aspirin for DVT prophylaxis.  Recent vital signs: Patient Vitals for the past 24 hrs:  BP Temp Temp src Pulse Resp SpO2  07/08/22 0643 116/63 99.5 F (37.5 C) Oral 81 16 95 %  07/07/22 2139 110/73 100.1 F (37.8 C) Oral 90 16 93 %  07/07/22 1500 109/63 98.2 F (36.8 C) Oral 94 18 95 %  07/07/22 0811 117/61 99.8 F (37.7 C) Oral 94 17 93 %  .  Recent laboratory studies: No results found.  Discharge Medications:   Allergies as of 07/08/2022       Reactions   Sulfacetamide Sodium Other (See Comments)   edema        Medication List     TAKE these medications    doxycycline 100 MG  tablet Commonly known as: VIBRA-TABS Take 1 tablet (100 mg total) by mouth 2 (two) times daily.   DULoxetine 60 MG capsule Commonly known as: CYMBALTA Take 60 mg by mouth daily.   gabapentin 300 MG capsule Commonly known as: NEURONTIN Take 300 mg by mouth 3 (three) times daily.   HYDROmorphone 2 MG tablet Commonly known as: Dilaudid Take 1 tablet (2 mg total) by mouth every 6 (six) hours as needed for severe pain.   Narcan 4 MG/0.1ML Liqd nasal spray kit Generic drug: naloxone Place 1 spray into the nose as needed (opioid reversal).   nicotine 21 mg/24hr patch Commonly known as: NICODERM CQ - dosed in mg/24 hours Place 1 patch (21 mg total) onto the skin daily.   omeprazole 20 MG capsule Commonly known as: PRILOSEC Take 20 mg by mouth daily.   Oxycodone HCl 20 MG Tabs Take 1 tablet (20 mg total) by mouth 5 (five) times daily.   Vitamin D (Ergocalciferol) 1.25 MG (50000 UNIT) Caps capsule Commonly known as: DRISDOL Take 50,000 Units by mouth every Friday.        Diagnostic Studies: DG Femur Min 2 Views Right  Result Date: 06/13/2022 CLINICAL DATA:  Possible osteo EXAM: RIGHT FEMUR 2 VIEWS COMPARISON:  None Available. FINDINGS: Prior right above the knee amputation.  No cortical irregularity is seen at the osteotomy site. No acute fracture or dislocation. IMPRESSION: Prior above the knee amputation. No radiographic evidence of osteomyelitis. Electronically Signed   By: Yetta Glassman M.D.   On: 06/13/2022 20:12    Patient benefited maximally from their hospital stay and there were no complications.     Disposition: Discharge disposition: 01-Home or Self Care      Discharge Instructions     Call MD / Call 911   Complete by: As directed    If you experience chest pain or shortness of breath, CALL 911 and be transported to the hospital emergency room.  If you develope a fever above 101 F, pus (white drainage) or increased drainage or redness at the wound, or calf  pain, call your surgeon's office.   Constipation Prevention   Complete by: As directed    Drink plenty of fluids.  Prune juice may be helpful.  You may use a stool softener, such as Colace (over the counter) 100 mg twice a day.  Use MiraLax (over the counter) for constipation as needed.   Diet - low sodium heart healthy   Complete by: As directed    Increase activity slowly as tolerated   Complete by: As directed    Post-operative opioid taper instructions:   Complete by: As directed    POST-OPERATIVE OPIOID TAPER INSTRUCTIONS: It is important to wean off of your opioid medication as soon as possible. If you do not need pain medication after your surgery it is ok to stop day one. Opioids include: Codeine, Hydrocodone(Norco, Vicodin), Oxycodone(Percocet, oxycontin) and hydromorphone amongst others.  Long term and even short term use of opiods can cause: Increased pain response Dependence Constipation Depression Respiratory depression And more.  Withdrawal symptoms can include Flu like symptoms Nausea, vomiting And more Techniques to manage these symptoms Hydrate well Eat regular healthy meals Stay active Use relaxation techniques(deep breathing, meditating, yoga) Do Not substitute Alcohol to help with tapering If you have been on opioids for less than two weeks and do not have pain than it is ok to stop all together.  Plan to wean off of opioids This plan should start within one week post op of your joint replacement. Maintain the same interval or time between taking each dose and first decrease the dose.  Cut the total daily intake of opioids by one tablet each day Next start to increase the time between doses. The last dose that should be eliminated is the evening dose.          Follow-up Information     Newt Minion, MD Follow up in 1 week(s).   Specialty: Orthopedic Surgery Contact information: [address] [place] [address] [phone]                   Signed: Newt Minion 07/08/2022, 7:49 AM

## 2022-07-12 ENCOUNTER — Other Ambulatory Visit: Payer: Self-pay

## 2022-07-12 ENCOUNTER — Emergency Department (HOSPITAL_COMMUNITY): Payer: Medicaid Other

## 2022-07-12 ENCOUNTER — Inpatient Hospital Stay (HOSPITAL_COMMUNITY)
Admission: EM | Admit: 2022-07-12 | Discharge: 2022-07-16 | DRG: 565 | Disposition: A | Discharge status: Home / Self Care | Payer: Medicaid Other | Attending: Family Medicine | Admitting: Family Medicine

## 2022-07-12 ENCOUNTER — Encounter (HOSPITAL_COMMUNITY): Payer: Self-pay

## 2022-07-12 DIAGNOSIS — F32A Depression, unspecified: Secondary | ICD-10-CM | POA: Diagnosis present

## 2022-07-12 DIAGNOSIS — Z79891 Long term (current) use of opiate analgesic: Secondary | ICD-10-CM

## 2022-07-12 DIAGNOSIS — Z89611 Acquired absence of right leg above knee: Secondary | ICD-10-CM

## 2022-07-12 DIAGNOSIS — L0291 Cutaneous abscess, unspecified: Secondary | ICD-10-CM

## 2022-07-12 DIAGNOSIS — T8743 Infection of amputation stump, right lower extremity: Principal | ICD-10-CM | POA: Diagnosis present

## 2022-07-12 DIAGNOSIS — Z89612 Acquired absence of left leg above knee: Secondary | ICD-10-CM

## 2022-07-12 DIAGNOSIS — Z882 Allergy status to sulfonamides status: Secondary | ICD-10-CM

## 2022-07-12 DIAGNOSIS — E872 Acidosis, unspecified: Secondary | ICD-10-CM

## 2022-07-12 DIAGNOSIS — L0390 Cellulitis, unspecified: Secondary | ICD-10-CM | POA: Diagnosis present

## 2022-07-12 DIAGNOSIS — M545 Low back pain, unspecified: Secondary | ICD-10-CM | POA: Diagnosis present

## 2022-07-12 DIAGNOSIS — Z87442 Personal history of urinary calculi: Secondary | ICD-10-CM

## 2022-07-12 DIAGNOSIS — F1910 Other psychoactive substance abuse, uncomplicated: Secondary | ICD-10-CM | POA: Insufficient documentation

## 2022-07-12 DIAGNOSIS — G546 Phantom limb syndrome with pain: Secondary | ICD-10-CM | POA: Diagnosis present

## 2022-07-12 DIAGNOSIS — F17210 Nicotine dependence, cigarettes, uncomplicated: Secondary | ICD-10-CM | POA: Diagnosis present

## 2022-07-12 DIAGNOSIS — F17200 Nicotine dependence, unspecified, uncomplicated: Secondary | ICD-10-CM | POA: Insufficient documentation

## 2022-07-12 DIAGNOSIS — G8929 Other chronic pain: Secondary | ICD-10-CM | POA: Diagnosis present

## 2022-07-12 DIAGNOSIS — I10 Essential (primary) hypertension: Secondary | ICD-10-CM | POA: Diagnosis present

## 2022-07-12 DIAGNOSIS — Z91138 Patient's unintentional underdosing of medication regimen for other reason: Secondary | ICD-10-CM

## 2022-07-12 DIAGNOSIS — L03115 Cellulitis of right lower limb: Secondary | ICD-10-CM | POA: Diagnosis present

## 2022-07-12 DIAGNOSIS — T364X6A Underdosing of tetracyclines, initial encounter: Secondary | ICD-10-CM | POA: Diagnosis present

## 2022-07-12 DIAGNOSIS — F1410 Cocaine abuse, uncomplicated: Secondary | ICD-10-CM | POA: Diagnosis present

## 2022-07-12 DIAGNOSIS — T8140XA Infection following a procedure, unspecified, initial encounter: Principal | ICD-10-CM

## 2022-07-12 DIAGNOSIS — Z86718 Personal history of other venous thrombosis and embolism: Secondary | ICD-10-CM

## 2022-07-12 DIAGNOSIS — K219 Gastro-esophageal reflux disease without esophagitis: Secondary | ICD-10-CM | POA: Insufficient documentation

## 2022-07-12 DIAGNOSIS — Z8249 Family history of ischemic heart disease and other diseases of the circulatory system: Secondary | ICD-10-CM

## 2022-07-12 DIAGNOSIS — Z79899 Other long term (current) drug therapy: Secondary | ICD-10-CM

## 2022-07-12 DIAGNOSIS — Y835 Amputation of limb(s) as the cause of abnormal reaction of the patient, or of later complication, without mention of misadventure at the time of the procedure: Secondary | ICD-10-CM | POA: Diagnosis present

## 2022-07-12 LAB — COMPREHENSIVE METABOLIC PANEL
ALT: 28 U/L (ref 0–44)
AST: 29 U/L (ref 15–41)
Albumin: 3.5 g/dL (ref 3.5–5.0)
Alkaline Phosphatase: 62 U/L (ref 38–126)
Anion gap: 9 (ref 5–15)
BUN: 12 mg/dL (ref 8–23)
CO2: 24 mmol/L (ref 22–32)
Calcium: 8.8 mg/dL — ABNORMAL LOW (ref 8.9–10.3)
Chloride: 103 mmol/L (ref 98–111)
Creatinine, Ser: 0.89 mg/dL (ref 0.61–1.24)
GFR, Estimated: >60 mL/min (ref >60)
Glucose, Bld: 96 mg/dL (ref 70–99)
Potassium: 3.5 mmol/L (ref 3.5–5.1)
Sodium: 136 mmol/L (ref 135–145)
Total Bilirubin: 0.4 mg/dL (ref 0.3–1.2)
Total Protein: 6.7 g/dL (ref 6.5–8.1)

## 2022-07-12 LAB — CBC WITH DIFFERENTIAL/PLATELET
Abs Immature Granulocytes: 0.02 10*3/uL (ref 0.00–0.07)
Basophils Absolute: 0 10*3/uL (ref 0.0–0.1)
Basophils Relative: 0 %
Eosinophils Absolute: 0.2 10*3/uL (ref 0.0–0.5)
Eosinophils Relative: 4 %
HCT: 36.5 % — ABNORMAL LOW (ref 39.0–52.0)
Hemoglobin: 12.6 g/dL — ABNORMAL LOW (ref 13.0–17.0)
Immature Granulocytes: 0 %
Lymphocytes Relative: 31 %
Lymphs Abs: 1.9 10*3/uL (ref 0.7–4.0)
MCH: 28.1 pg (ref 26.0–34.0)
MCHC: 34.5 g/dL (ref 30.0–36.0)
MCV: 81.3 fL (ref 80.0–100.0)
Monocytes Absolute: 0.6 10*3/uL (ref 0.1–1.0)
Monocytes Relative: 9 %
Neutro Abs: 3.3 10*3/uL (ref 1.7–7.7)
Neutrophils Relative %: 56 %
Platelets: 260 10*3/uL (ref 150–400)
RBC: 4.49 MIL/uL (ref 4.22–5.81)
RDW: 13 % (ref 11.5–15.5)
WBC: 6 10*3/uL (ref 4.0–10.5)
nRBC: 0 % (ref 0.0–0.2)

## 2022-07-12 LAB — LACTIC ACID, PLASMA
Lactic Acid, Venous: 1.2 mmol/L (ref 0.5–1.9)
Lactic Acid, Venous: 2.2 mmol/L (ref 0.5–1.9)

## 2022-07-12 MED ORDER — ACETAMINOPHEN 325 MG PO TABS
650.0000 mg | ORAL_TABLET | Freq: Once | ORAL | Status: AC
  Administered 2022-07-12: 650 mg via ORAL
  Filled 2022-07-12: qty 2

## 2022-07-12 MED ORDER — IOHEXOL 300 MG/ML  SOLN
75.0000 mL | Freq: Once | INTRAMUSCULAR | Status: AC | PRN
  Administered 2022-07-12: 75 mL via INTRAVENOUS

## 2022-07-12 MED ORDER — SODIUM CHLORIDE 0.9 % IV BOLUS
500.0000 mL | Freq: Once | INTRAVENOUS | Status: AC
  Administered 2022-07-12: 500 mL via INTRAVENOUS

## 2022-07-12 MED ORDER — CEFAZOLIN SODIUM-DEXTROSE 1-4 GM/50ML-% IV SOLN
1.0000 g | Freq: Three times a day (TID) | INTRAVENOUS | Status: DC
  Administered 2022-07-13 – 2022-07-16 (×10): 1 g via INTRAVENOUS
  Filled 2022-07-12 (×16): qty 50

## 2022-07-12 MED ORDER — ACETAMINOPHEN 650 MG RE SUPP: 650.0000 mg | Freq: Four times a day (QID) | RECTAL | Status: DC | PRN

## 2022-07-12 MED ORDER — SODIUM CHLORIDE 0.9 % IV BOLUS
1000.0000 mL | Freq: Once | INTRAVENOUS | Status: AC
  Administered 2022-07-12: 1000 mL via INTRAVENOUS

## 2022-07-12 MED ORDER — ONDANSETRON HCL 4 MG PO TABS: 4.0000 mg | ORAL_TABLET | Freq: Four times a day (QID) | ORAL | Status: DC | PRN

## 2022-07-12 MED ORDER — ACETAMINOPHEN 325 MG PO TABS: 650.0000 mg | ORAL_TABLET | Freq: Four times a day (QID) | ORAL | Status: DC | PRN

## 2022-07-12 MED ORDER — MORPHINE SULFATE (PF) 2 MG/ML IV SOLN
2.0000 mg | INTRAVENOUS | Status: DC | PRN
  Administered 2022-07-13: 2 mg via INTRAVENOUS
  Filled 2022-07-12: qty 1

## 2022-07-12 MED ORDER — OXYCODONE HCL 5 MG PO TABS
5.0000 mg | ORAL_TABLET | ORAL | Status: DC | PRN
  Administered 2022-07-13 – 2022-07-14 (×2): 5 mg via ORAL
  Filled 2022-07-12 (×2): qty 1

## 2022-07-12 MED ORDER — HYDROMORPHONE HCL 1 MG/ML IJ SOLN
1.0000 mg | Freq: Once | INTRAMUSCULAR | Status: AC
  Administered 2022-07-12: 1 mg via INTRAVENOUS
  Filled 2022-07-12: qty 1

## 2022-07-12 MED ORDER — PANTOPRAZOLE SODIUM 40 MG PO TBEC
40.0000 mg | DELAYED_RELEASE_TABLET | Freq: Every day | ORAL | Status: DC
  Administered 2022-07-13 – 2022-07-16 (×4): 40 mg via ORAL
  Filled 2022-07-12 (×4): qty 1

## 2022-07-12 MED ORDER — DULOXETINE HCL 60 MG PO CPEP
60.0000 mg | ORAL_CAPSULE | Freq: Every day | ORAL | Status: DC
  Administered 2022-07-13 – 2022-07-16 (×4): 60 mg via ORAL
  Filled 2022-07-12 (×4): qty 1

## 2022-07-12 MED ORDER — SODIUM CHLORIDE 0.9 % IV SOLN: INTRAVENOUS | Status: DC

## 2022-07-12 MED ORDER — CEFAZOLIN SODIUM-DEXTROSE 1-4 GM/50ML-% IV SOLN
1.0000 g | Freq: Once | INTRAVENOUS | Status: AC
  Administered 2022-07-12: 1 g via INTRAVENOUS
  Filled 2022-07-12 (×2): qty 50

## 2022-07-12 MED ORDER — ONDANSETRON HCL 4 MG/2ML IJ SOLN: 4.0000 mg | Freq: Four times a day (QID) | INTRAMUSCULAR | Status: DC | PRN

## 2022-07-12 MED ORDER — HEPARIN SODIUM (PORCINE) 5000 UNIT/ML IJ SOLN
5000.0000 [IU] | Freq: Three times a day (TID) | INTRAMUSCULAR | Status: DC
  Administered 2022-07-13 – 2022-07-16 (×10): 5000 [IU] via SUBCUTANEOUS
  Filled 2022-07-12 (×9): qty 1

## 2022-07-12 MED ORDER — GABAPENTIN 300 MG PO CAPS
300.0000 mg | ORAL_CAPSULE | Freq: Three times a day (TID) | ORAL | Status: DC
  Administered 2022-07-13 – 2022-07-16 (×11): 300 mg via ORAL
  Filled 2022-07-12 (×11): qty 1

## 2022-07-12 MED ORDER — KETOROLAC TROMETHAMINE 15 MG/ML IJ SOLN
15.0000 mg | Freq: Once | INTRAMUSCULAR | Status: AC
  Administered 2022-07-12: 15 mg via INTRAVENOUS
  Filled 2022-07-12: qty 1

## 2022-07-12 NOTE — ED Notes (Signed)
On call for Dr Lajoyce Corners paged to Dr Wallace Cullens @ [phone]

## 2022-07-12 NOTE — ED Notes (Signed)
Entered patient's room states that he called for more wash clothes to suck on when he is in pain. Pt  reports he couldn't reach the towels and wash clothes placed beside  him, so he pulled out  his IV. Noted several wash clothes and towels on the floor next to patient's left side.

## 2022-07-12 NOTE — ED Notes (Signed)
Patient transported to CT 

## 2022-07-12 NOTE — ED Triage Notes (Signed)
Pt was d/c from [facility] on 12/19. Pt states he had 3 inches removed from right leg. Pt is having new swelling and redness to the site. Pt is above the knee amputee.

## 2022-07-12 NOTE — ED Notes (Signed)
Patient repositioned with pillow support and towels placed to left side. Dressing intact to right stump. Patient states pain medication effective.

## 2022-07-12 NOTE — ED Provider Notes (Signed)
[facility] EMERGENCY DEPARTMENT Provider Note   CSN: [id number] Arrival date & time: 07/12/22  1812     History  Chief Complaint  Patient presents with   Post-op Problem    Jeffery Gross is a 61 y.o. male.  Patient is a 61 year old male past medical history of hypertension, tobacco use, DVTs, and bilateral above-the-knee amputations with recent right sided amputation revision 8 days ago presenting for complaints of right lower extremity pain.  Patient admits to redness, pain, swelling, and drainage.  The history is provided by the patient. No language interpreter was used.       Home Medications Prior to Admission medications   Medication Sig Start Date End Date Taking? Authorizing Provider  doxycycline (VIBRA-TABS) 100 MG tablet Take 1 tablet (100 mg total) by mouth 2 (two) times daily. 06/27/22   Nadara Mustard, MD  DULoxetine (CYMBALTA) 60 MG capsule Take 60 mg by mouth daily.    [provider]  gabapentin (NEURONTIN) 300 MG capsule Take 300 mg by mouth 3 (three) times daily. 05/16/22   [provider]  HYDROmorphone (DILAUDID) 2 MG tablet Take 1 tablet (2 mg total) by mouth every 6 (six) hours as needed for severe pain. 07/08/22   Nadara Mustard, MD  naloxone [facility]) nasal spray 4 mg/0.1 mL Place 1 spray into the nose as needed (opioid reversal).    [provider]  nicotine (NICODERM CQ - DOSED IN MG/24 HOURS) 21 mg/24hr patch Place 1 patch (21 mg total) onto the skin daily. 06/15/22   Vassie Loll, MD  omeprazole (PRILOSEC) 20 MG capsule Take 20 mg by mouth daily. 03/19/22   [provider]  Oxycodone HCl 20 MG TABS Take 1 tablet (20 mg total) by mouth 5 (five) times daily. 07/01/22   Nadara Mustard, MD  Vitamin D, Ergocalciferol, (DRISDOL) 1.25 MG (50000 UNIT) CAPS capsule Take 50,000 Units by mouth every Friday. 04/30/22   [provider]      Allergies    Sulfacetamide sodium    Review of Systems   Review of Systems   Constitutional:  Negative for chills and fever.  HENT:  Negative for ear pain and sore throat.   Eyes:  Negative for pain and visual disturbance.  Respiratory:  Negative for cough and shortness of breath.   Cardiovascular:  Negative for chest pain and palpitations.  Gastrointestinal:  Negative for abdominal pain and vomiting.  Genitourinary:  Negative for dysuria and hematuria.  Musculoskeletal:  Negative for arthralgias and back pain.  Skin:  Negative for color change and rash.  Neurological:  Negative for seizures and syncope.  All other systems reviewed and are negative.   Physical Exam Updated Vital Signs BP (!) 152/101 (BP Location: Left Arm)   Pulse 91   Temp 98.2 F (36.8 C) (Oral)   Resp 18   Ht 4' (1.219 m)   Wt 70.3 kg   SpO2 98%   BMI 47.30 kg/m  Physical Exam Vitals and nursing note reviewed.  Constitutional:      General: He is not in acute distress.    Appearance: He is well-developed.  HENT:     Head: Normocephalic and atraumatic.  Eyes:     Conjunctiva/sclera: Conjunctivae normal.  Cardiovascular:     Rate and Rhythm: Normal rate and regular rhythm.     Heart sounds: No murmur heard. Pulmonary:     Effort: Pulmonary effort is normal. No respiratory distress.     Breath sounds: Normal  breath sounds.  Abdominal:     Palpations: Abdomen is soft.     Tenderness: There is no abdominal tenderness.  Musculoskeletal:        General: No swelling.     Cervical back: Neck supple.     Right Lower Extremity: Right leg is amputated above knee. (Right lower extremity above-the-knee amputation is erythematous, warm, and significantly tender to the touch.  Serosanguineous fluid draining from its incision site.  Suture's are still in place from recent revision.)    Left Lower Extremity: Left leg is amputated above knee.  Skin:    General: Skin is warm and dry.     Capillary Refill: Capillary refill takes less than 2 seconds.  Neurological:     Mental Status: He is  alert.  Psychiatric:        Mood and Affect: Mood normal.     ED Results / Procedures / Treatments   Labs (all labs ordered are listed, but only abnormal results are displayed) Labs Reviewed  COMPREHENSIVE METABOLIC PANEL - Abnormal; Notable for the following components:      Result Value   Calcium 8.8 (*)    All other components within normal limits  CBC WITH DIFFERENTIAL/PLATELET - Abnormal; Notable for the following components:   Hemoglobin 12.6 (*)    HCT 36.5 (*)    All other components within normal limits  CULTURE, BLOOD (ROUTINE X 2)  CULTURE, BLOOD (ROUTINE X 2)  LACTIC ACID, PLASMA  LACTIC ACID, PLASMA  URINALYSIS, ROUTINE W REFLEX MICROSCOPIC    EKG None  Radiology CT EXTREMITY LOWER RIGHT W CONTRAST  Result Date: 07/12/2022 CLINICAL DATA:  Right knee amputation revision 4 days ago. New pain, swelling, and warmth at the incision site. Concern for infection. EXAM: CT OF THE LOWER RIGHT EXTREMITY WITH CONTRAST TECHNIQUE: Multidetector CT imaging of the lower right extremity was performed according to the standard protocol following intravenous contrast administration. RADIATION DOSE REDUCTION: This exam was performed according to the departmental dose-optimization program which includes automated exposure control, adjustment of the mA and/or kV according to patient size and/or use of iterative reconstruction technique. CONTRAST:  50mL OMNIPAQUE IOHEXOL 300 MG/ML  SOLN COMPARISON:  Radiographs 06/13/2022 FINDINGS: Bones/Joint/Cartilage Recent postoperative change of right femur above the knee amputation revision. No evidence of acute fracture or osteomyelitis. Degenerative arthritis right hip. Partially visualized lumbosacral fusion. Ligaments Suboptimally assessed by CT. Muscles and Tendons Low to intermediate density peripherally enhancing bilobed fluid collection about both sides of the distal femoral stump within the anterior compartment musculature. This measures  approximately 9.2 x 3.0 x 5.7 cm and demonstrates peripheral enhancement. Fat density lesion within the proximal rectus femoris likely a benign lipoma. Soft tissues Soft tissue swelling about the distal right thigh at the amputation site greatest laterally where there is an additional small 1.8 cm loosely organized fluid collection (series 5/image 222). No soft tissue gas. IMPRESSION: Peripherally enhancing low-intermediate density fluid collection about the distal femoral stump within the anterior compartment musculature. This is nonspecific given recent postoperative change and could be a postoperative hematoma/seroma. Superimposed infection/abscess is not excluded. No evidence of osteomyelitis. Electronically Signed   By: Minerva Fester M.D.   On: 07/12/2022 21:23    Procedures Procedures    Medications Ordered in ED Medications  ceFAZolin (ANCEF) IVPB 1 g/50 mL premix (1 g Intravenous New Bag/Given 07/12/22 2206)  HYDROmorphone (DILAUDID) injection 1 mg (has no administration in time range)  ketorolac (TORADOL) 15 MG/ML injection 15 mg (has no  administration in time range)  acetaminophen (TYLENOL) tablet 650 mg (has no administration in time range)  sodium chloride 0.9 % bolus 1,000 mL (1,000 mLs Intravenous New Bag/Given 07/12/22 1944)  HYDROmorphone (DILAUDID) injection 1 mg (1 mg Intravenous Given 07/12/22 1944)  iohexol (OMNIPAQUE) 300 MG/ML solution 75 mL (75 mLs Intravenous Contrast Given 07/12/22 2033)    ED Course/ Medical Decision Making/ A&P                           Medical Decision Making Amount and/or Complexity of Data Reviewed Labs: ordered. Radiology: ordered.  Risk OTC drugs. Prescription drug management. Decision regarding hospitalization.   72:32 PM 61 year old male past medical history of hypertension, tobacco use, DVTs, and bilateral above-the-knee amputations with recent right sided amputation revision 8 days ago presenting for complaints of right lower  extremity pain.  Chart review demonstrates patient had right above-the-knee amputation revision by orthopedic specialist Dr. Lajoyce Corners on 07/04/2022.  On exam patient is alert and oriented x 3, acute distress due to pain, afebrile, with otherwise stable vital signs.  Physical exam demonstrates Right lower extremity above-the-knee amputation is erythematous, warm, and significantly tender to the touch. Serosanguineous fluid draining from its incision site. Suture's are still in place from recent revision.   Concerns for cellulitis versus abscess. Dilaudid given for pain.   Laboratory studies demonstrates no leukocytosis. No signs or symptoms of sepsis. CT lower extremity with contrast concerning for abscess. "Low to intermediate density peripherally enhancing bilobed fluid collection about both sides of the distal femoral stump within the anterior compartment musculature. This measures approximately 9.2 x 3.0 x 5.7 cm and demonstrates peripheral enhancement."  RE-evaluation demonstrates continued pain. Dilaudid redosed. Toradol and tylenol also given.  Ancef given. I spoke with Dr. Audrie Lia surgical on call team who recommend admission for IV antibiotics for 24-48 hours then close follow up in the office in one week. Patient agreeable to plan. I spoke with admitting physician Dr. Carren Rang who agrees to accept patient.         Final Clinical Impression(s) / ED Diagnoses Final diagnoses:  Postoperative infection, unspecified type, initial encounter  Abscess    Rx / DC Orders ED Discharge Orders     None         Franne Forts, DO 07/12/22 2213

## 2022-07-12 NOTE — ED Notes (Signed)
Patient repositioned in bed x 3. Pillow support to left arm and right stump. Cleanse area with normal saline at incisional line. Applied dressing. Patient continues to pick his skin and ask nurse what is in his hand. Explained to patient it his scab off of his sores. Patient also picking cotton off linens asking nurse what is the white speck in his hand.  Explained to the patient it is the lint from his linens. He mention that he would like to have an microscope. Reassured patient his bed linens are clean.

## 2022-07-13 DIAGNOSIS — L0291 Cutaneous abscess, unspecified: Secondary | ICD-10-CM | POA: Diagnosis not present

## 2022-07-13 DIAGNOSIS — F17200 Nicotine dependence, unspecified, uncomplicated: Secondary | ICD-10-CM | POA: Insufficient documentation

## 2022-07-13 DIAGNOSIS — Z89612 Acquired absence of left leg above knee: Secondary | ICD-10-CM | POA: Diagnosis not present

## 2022-07-13 DIAGNOSIS — T364X6A Underdosing of tetracyclines, initial encounter: Secondary | ICD-10-CM | POA: Diagnosis present

## 2022-07-13 DIAGNOSIS — Z87442 Personal history of urinary calculi: Secondary | ICD-10-CM | POA: Diagnosis not present

## 2022-07-13 DIAGNOSIS — M545 Low back pain, unspecified: Secondary | ICD-10-CM

## 2022-07-13 DIAGNOSIS — F1910 Other psychoactive substance abuse, uncomplicated: Secondary | ICD-10-CM

## 2022-07-13 DIAGNOSIS — Y835 Amputation of limb(s) as the cause of abnormal reaction of the patient, or of later complication, without mention of misadventure at the time of the procedure: Secondary | ICD-10-CM | POA: Diagnosis present

## 2022-07-13 DIAGNOSIS — E872 Acidosis, unspecified: Secondary | ICD-10-CM | POA: Diagnosis not present

## 2022-07-13 DIAGNOSIS — Z8249 Family history of ischemic heart disease and other diseases of the circulatory system: Secondary | ICD-10-CM | POA: Diagnosis not present

## 2022-07-13 DIAGNOSIS — G8929 Other chronic pain: Secondary | ICD-10-CM | POA: Diagnosis present

## 2022-07-13 DIAGNOSIS — L03115 Cellulitis of right lower limb: Secondary | ICD-10-CM | POA: Diagnosis not present

## 2022-07-13 DIAGNOSIS — T8743 Infection of amputation stump, right lower extremity: Secondary | ICD-10-CM | POA: Diagnosis not present

## 2022-07-13 DIAGNOSIS — Z79899 Other long term (current) drug therapy: Secondary | ICD-10-CM | POA: Diagnosis not present

## 2022-07-13 DIAGNOSIS — I10 Essential (primary) hypertension: Secondary | ICD-10-CM | POA: Diagnosis present

## 2022-07-13 DIAGNOSIS — F17210 Nicotine dependence, cigarettes, uncomplicated: Secondary | ICD-10-CM | POA: Diagnosis present

## 2022-07-13 DIAGNOSIS — L0390 Cellulitis, unspecified: Secondary | ICD-10-CM | POA: Diagnosis not present

## 2022-07-13 DIAGNOSIS — Z882 Allergy status to sulfonamides status: Secondary | ICD-10-CM | POA: Diagnosis not present

## 2022-07-13 DIAGNOSIS — F32A Depression, unspecified: Secondary | ICD-10-CM | POA: Diagnosis present

## 2022-07-13 DIAGNOSIS — F1410 Cocaine abuse, uncomplicated: Secondary | ICD-10-CM | POA: Diagnosis present

## 2022-07-13 DIAGNOSIS — Z91138 Patient's unintentional underdosing of medication regimen for other reason: Secondary | ICD-10-CM | POA: Diagnosis not present

## 2022-07-13 DIAGNOSIS — Z79891 Long term (current) use of opiate analgesic: Secondary | ICD-10-CM | POA: Diagnosis not present

## 2022-07-13 DIAGNOSIS — K219 Gastro-esophageal reflux disease without esophagitis: Secondary | ICD-10-CM | POA: Diagnosis not present

## 2022-07-13 DIAGNOSIS — G546 Phantom limb syndrome with pain: Secondary | ICD-10-CM | POA: Diagnosis present

## 2022-07-13 DIAGNOSIS — Z89611 Acquired absence of right leg above knee: Secondary | ICD-10-CM | POA: Diagnosis not present

## 2022-07-13 DIAGNOSIS — Z86718 Personal history of other venous thrombosis and embolism: Secondary | ICD-10-CM | POA: Diagnosis not present

## 2022-07-13 DIAGNOSIS — T8140XA Infection following a procedure, unspecified, initial encounter: Secondary | ICD-10-CM | POA: Diagnosis not present

## 2022-07-13 LAB — CBC WITH DIFFERENTIAL/PLATELET
Abs Immature Granulocytes: 0.03 10*3/uL (ref 0.00–0.07)
Basophils Absolute: 0 10*3/uL (ref 0.0–0.1)
Basophils Relative: 0 %
Eosinophils Absolute: 0.5 10*3/uL (ref 0.0–0.5)
Eosinophils Relative: 8 %
HCT: 33.2 % — ABNORMAL LOW (ref 39.0–52.0)
Hemoglobin: 10.8 g/dL — ABNORMAL LOW (ref 13.0–17.0)
Immature Granulocytes: 1 %
Lymphocytes Relative: 37 %
Lymphs Abs: 2.2 10*3/uL (ref 0.7–4.0)
MCH: 27.8 pg (ref 26.0–34.0)
MCHC: 32.5 g/dL (ref 30.0–36.0)
MCV: 85.3 fL (ref 80.0–100.0)
Monocytes Absolute: 0.6 10*3/uL (ref 0.1–1.0)
Monocytes Relative: 10 %
Neutro Abs: 2.6 10*3/uL (ref 1.7–7.7)
Neutrophils Relative %: 44 %
Platelets: 205 10*3/uL (ref 150–400)
RBC: 3.89 MIL/uL — ABNORMAL LOW (ref 4.22–5.81)
RDW: 13.2 % (ref 11.5–15.5)
WBC: 6 10*3/uL (ref 4.0–10.5)
nRBC: 0 % (ref 0.0–0.2)

## 2022-07-13 LAB — COMPREHENSIVE METABOLIC PANEL
ALT: 21 U/L (ref 0–44)
AST: 24 U/L (ref 15–41)
Albumin: 2.7 g/dL — ABNORMAL LOW (ref 3.5–5.0)
Alkaline Phosphatase: 54 U/L (ref 38–126)
Anion gap: 7 (ref 5–15)
BUN: 12 mg/dL (ref 8–23)
CO2: 25 mmol/L (ref 22–32)
Calcium: 8 mg/dL — ABNORMAL LOW (ref 8.9–10.3)
Chloride: 108 mmol/L (ref 98–111)
Creatinine, Ser: 1.04 mg/dL (ref 0.61–1.24)
GFR, Estimated: >60 mL/min (ref >60)
Glucose, Bld: 95 mg/dL (ref 70–99)
Potassium: 3.5 mmol/L (ref 3.5–5.1)
Sodium: 140 mmol/L (ref 135–145)
Total Bilirubin: 0.2 mg/dL — ABNORMAL LOW (ref 0.3–1.2)
Total Protein: 5.2 g/dL — ABNORMAL LOW (ref 6.5–8.1)

## 2022-07-13 LAB — PROCALCITONIN: Procalcitonin: <0.1 ng/mL

## 2022-07-13 LAB — CBG MONITORING, ED: Glucose-Capillary: 107 mg/dL — ABNORMAL HIGH (ref 70–99)

## 2022-07-13 LAB — LACTIC ACID, PLASMA: Lactic Acid, Venous: 1.3 mmol/L (ref 0.5–1.9)

## 2022-07-13 LAB — MAGNESIUM: Magnesium: 2 mg/dL (ref 1.7–2.4)

## 2022-07-13 MED ORDER — NICOTINE 14 MG/24HR TD PT24
14.0000 mg | MEDICATED_PATCH | Freq: Every day | TRANSDERMAL | Status: DC
  Administered 2022-07-13 – 2022-07-16 (×4): 14 mg via TRANSDERMAL
  Filled 2022-07-13 (×4): qty 1

## 2022-07-13 MED ORDER — MORPHINE SULFATE (PF) 4 MG/ML IV SOLN
4.0000 mg | INTRAVENOUS | Status: DC | PRN
  Administered 2022-07-13 – 2022-07-16 (×11): 4 mg via INTRAVENOUS
  Filled 2022-07-13 (×11): qty 1

## 2022-07-13 MED ORDER — CHLORHEXIDINE GLUCONATE CLOTH 2 % EX PADS
6.0000 | MEDICATED_PAD | Freq: Every day | CUTANEOUS | Status: DC
  Administered 2022-07-13 – 2022-07-16 (×4): 6 via TOPICAL

## 2022-07-13 NOTE — ED Notes (Signed)
This RN politely asked patient and family not to touch IV pump. Jeffery Gross Gerrianne Scale

## 2022-07-13 NOTE — ED Notes (Signed)
Report given to Bree, RN 

## 2022-07-13 NOTE — Hospital Course (Signed)
Mr. Mcclaren is a 61 yo male with PMH B/L AKA, DVT, HTN, depression, GERD who presented with RLE pain.  He reports that he has had increased swelling in his right stump after recent surgery on 07/04/2022 that involved revision of the AKA and further debridement of bone due to osteomyelitis.  The original amputation was approximately 2017. He has been living in a motel and/or with friends since discharge and it is not clear if he was taking the doxycycline at discharge. He endorses that the stump has drained some serosanguineous fluid but no overt pus by his description.  He also denied any fevers, chills, sweats. He was afebrile on admission with no leukocytosis.  Case was discussed with orthopedic surgery and he was recommended for admission for IV antibiotics and monitoring. CT was obtained as well which showed "peripherally enhancing low intermediate density fluid collection about the distal femoral stump within the anterior compartment musculature."  This was also discussed with orthopedic surgery the morning following admission and is considered expected in setting of the postop course.  As long as patient has no overt pus, no surgical intervention is indicated.

## 2022-07-13 NOTE — TOC Initial Note (Addendum)
Transition of Care [facility]) - Initial/Assessment Note    Patient Details  Name: Jeffery Gross MRN: [id number] Date of Birth: 12/17/1960  [facility] of [facility]) CM/SW Contact:    Elliot Gault, LCSW Phone Number: 07/13/2022, 2:37 PM  Clinical Narrative:                  Pt admitted from home. Anticipating dc home tomorrow. Received TOC consult for SA treatment resources and housing resources. Written information will be provided to pt prior to dc. SDOH food insecurity resources will also be provided based on SDOH screen.  No other TOC needs are anticipated.  Expected Discharge Plan: Home/Self Care Barriers to Discharge: Continued Medical Work up   Patient Goals and CMS Choice            Expected Discharge Plan and Services In-house Referral: Clinical Social Work     Living arrangements for the past 2 months: Single Family Home                                      Prior Living Arrangements/Services Living arrangements for the past 2 months: Single Family Home Lives with:: Self Patient language and need for interpreter reviewed:: Yes Do you feel safe going back to the place where you live?: Yes      Need for Family Participation in Patient Care: No (Comment)     Criminal Activity/Legal Involvement Pertinent to Current Situation/Hospitalization: No - Comment as needed  Activities of Daily Living Home Assistive Devices/Equipment: Prosthesis, Wheelchair, Eyeglasses ADL Screening (condition at time of admission) Patient's cognitive ability adequate to safely complete daily activities?: Yes Is the patient deaf or have difficulty hearing?: No Does the patient have difficulty seeing, even when wearing glasses/contacts?: No Does the patient have difficulty concentrating, remembering, or making decisions?: No Patient able to express need for assistance with ADLs?: Yes Does the patient have difficulty dressing or bathing?: Yes Independently performs ADLs?:  No Communication: Independent Dressing (OT): Independent Grooming: Independent Feeding: Independent Bathing: Needs assistance Is this a change from baseline?: Pre-admission baseline Toileting: Needs assistance Is this a change from baseline?: Pre-admission baseline In/Out Bed: Needs assistance Is this a change from baseline?: Pre-admission baseline Walks in Home: Dependent Is this a change from baseline?: Pre-admission baseline Does the patient have difficulty walking or climbing stairs?: Yes Weakness of Legs: Both Weakness of Arms/Hands: None  Permission Sought/Granted                  Emotional Assessment       Orientation: : Oriented to Self, Oriented to Place, Oriented to  Time, Oriented to Situation Alcohol / Substance Use: Not Applicable Psych Involvement: No (comment)  Admission diagnosis:  Abscess [L02.91] Cellulitis [L03.90] Postoperative infection, unspecified type, initial encounter [T81.40XA] Patient Active Problem List   Diagnosis Date Noted   Tobacco use disorder 07/13/2022   Substance abuse (HCC) 07/13/2022   Cellulitis 07/12/2022   GERD (gastroesophageal reflux disease) 07/12/2022   AKA stump complication (HCC) 07/05/2022   Wound dehiscence 07/04/2022   Hx of AKA (above knee amputation), right (HCC) 07/04/2022   History of MRSA infection 06/14/2022   Cellulitis of right lower extremity 06/14/2022   Infected wound 06/13/2022   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   Seasonal allergies 02/08/2016   Anemia due to other cause 02/08/2016   Chronic pain syndrome 02/08/2016   Depression 02/08/2016   Post-operative pain  Ischemia of lower extremity 01/23/2016   Hypoalbuminemia due to protein-calorie malnutrition ([place])    Dysuria    Tobacco abuse    Constipation due to pain medication    Chronic low back pain    Benign essential HTN    Asthma    Hyponatremia    CKD (chronic kidney disease)    Anemia of chronic disease    Transaminitis    Phantom limb  syndrome with pain (HCC)    S/P AKA (above knee amputation) bilateral ([place])    Acute kidney injury ([place])    Syncope    Pressure ulcer 01/12/2016   Fall    Encounter for central line placement    Urinary retention    AKI (acute kidney injury) ([place])    Traumatic rhabdomyolysis (HCC)    Rhabdomyolysis 01/03/2016   Nontraumatic compartment syndrome of leg 01/03/2016   Chest pain 10/27/2012   HTN (hypertension) 10/27/2012   Back pain 10/27/2012   PCP:  Sandi Mariscal, MD Pharmacy:   [facility] [place], [place] [address] [place] [address] Phone: [phone] Fax: [place] [address] [place] [address] [place] [address] Phone: [phone] Fax: [phone]     Social Determinants of Health (SDOH) Social History: SDOH Screenings   Food Insecurity: Food Insecurity Present (07/13/2022)  Housing: Low Risk  (07/13/2022)  Transportation Needs: No Transportation Needs (07/13/2022)  Utilities: Not At Risk (07/13/2022)  Tobacco Use: High Risk (07/12/2022)   SDOH Interventions: Housing Interventions: Intervention Not Indicated   Readmission Risk Interventions     No data to display

## 2022-07-13 NOTE — ED Notes (Signed)
Dr Zierle-Gosh at bedside.Enis Leatherwood Marquis Diles,RN 

## 2022-07-13 NOTE — Progress Notes (Signed)
Progress Note    Jeffery Gross   ZOX:[id number]  DOB: [birth year]  DOA: 07/12/2022     0 PCP: Jeffery Gross  Initial CC: Right lower extremity pain  Hospital Course: [name] is a 61 yo male with PMH B/L AKA, DVT, HTN, depression, GERD who presented with RLE pain.  He reports that he has had increased swelling in his right stump after recent surgery on 07/04/2022 that involved revision of the AKA and further debridement of bone due to osteomyelitis.  The original amputation was approximately 2017. He has been living in a motel and/or with friends since discharge and it is not clear if he was taking the doxycycline at discharge. He endorses that the stump has drained some serosanguineous fluid but no overt pus by his description.  He also denied any fevers, chills, sweats. He was afebrile on admission with no leukocytosis.  Case was discussed with orthopedic surgery and he was recommended for admission for IV antibiotics and monitoring. CT was obtained as well which showed "peripherally enhancing low intermediate density fluid collection about the distal femoral stump within the anterior compartment musculature."  This was also discussed with orthopedic surgery the morning following admission and is considered expected in setting of the postop course.  As long as patient has no overt pus, no surgical intervention is indicated.  Interval History:  No events overnight.  Seen in his room after arriving from the ER this morning.  Still having ongoing pain and requesting further medication.  Denies fevers, chills.  Son present bedside as well.  Assessment and Plan: * Cellulitis - Mild erythema but no different when compared with picture from his postop on his phone.  He does have increased pain at the site and ongoing edema although technically it seems to actually be less than after surgery.  There is no overt pus either.  He also remains afebrile and still no leukocytosis - Continue Ancef and  continue monitoring clinically - Elevate right lower extremity as able - Discussed with orthopedic surgery on 07/13/2022.  Only if patient develops purulent drainage would he require further invasive intervention, otherwise supportive care, antibiotics, and outpatient follow-up as already planned -Continue pain control  Lactic acid acidosis-resolved as of 07/13/2022 - Suspected due to underlying cellulitis - Responded to fluids  Substance abuse (HCC) - Last used cocaine recently but does not remember which day - TOC consulted - Continue to monitor  Tobacco use disorder - Smokes half a pack per day - Request nicotine patch - Counseled on the importance of cessation - Continue to monitor  GERD (gastroesophageal reflux disease) - Continue Protonix  Chronic low back pain - Continue Cymbalta - Continue oxycodone   Old records reviewed in assessment of this patient  Antimicrobials: Ancef 07/12/2022 >> current  DVT prophylaxis:  heparin injection 5,000 Units Start: 07/13/22 0600 SCDs Start: 07/12/22 2345   Code Status:   Code Status: Full Code  Mobility Assessment (last 72 hours)     Mobility Assessment     Row Name 07/13/22 0130           Does patient have an order for bedrest or is patient medically unstable No - Continue assessment       What is the highest level of mobility based on the progressive mobility assessment? Level 2 (Chairfast) - Balance while sitting on edge of bed and cannot stand       Is the above level different from baseline mobility prior to current illness?  Yes - Recommend PT order                Barriers to discharge:  Disposition Plan: Home 2 to 3 days Status is: Inpatient  Objective: Blood pressure 112/66, pulse 60, temperature 97.7 F (36.5 C), temperature source Oral, resp. rate 18, height 4' (1.219 m), weight 70.3 kg, SpO2 94 %.  Examination:  Physical Exam Constitutional:      General: He is not in acute distress.     Appearance: Normal appearance.  HENT:     Head: Normocephalic and atraumatic.     Mouth/Throat:     Mouth: Mucous membranes are moist.  Eyes:     Extraocular Movements: Extraocular movements intact.  Cardiovascular:     Rate and Rhythm: Normal rate and regular rhythm.     Heart sounds: Normal heart sounds.  Pulmonary:     Effort: Pulmonary effort is normal. No respiratory distress.     Breath sounds: Normal breath sounds. No wheezing.  Abdominal:     General: Bowel sounds are normal. There is no distension.     Palpations: Abdomen is soft.     Tenderness: There is no abdominal tenderness.  Musculoskeletal:     Cervical back: Normal range of motion and neck supple.     Comments: Right stump noted with edema, mild erythema along suture sites.  No purulent drainage.  Prior evidence of some serosanguineous drainage noted around the medial sutures.  No significant calor.  Tenderness seems appropriate s/p surgery and or worse 2/2 edema  Skin:    General: Skin is warm and dry.  Neurological:     General: No focal deficit present.     Mental Status: He is alert.  Psychiatric:        Mood and Affect: Mood normal.        Behavior: Behavior normal.   Pic taken 12/24:       Consultants:    Procedures:    Data Reviewed: Results for orders placed or performed during the hospital encounter of 07/12/22 (from the past 24 hour(s))  Lactic acid, plasma     Status: None   Collection Time: 07/12/22  7:30 PM  Result Value Ref Range   Lactic Acid, Venous 1.2 0.5 - 1.9 mmol/L  Comprehensive metabolic panel     Status: Abnormal   Collection Time: 07/12/22  7:30 PM  Result Value Ref Range   Sodium 136 135 - 145 mmol/L   Potassium 3.5 3.5 - 5.1 mmol/L   Chloride 103 98 - 111 mmol/L   CO2 24 22 - 32 mmol/L   Glucose, Bld 96 70 - 99 mg/dL   BUN 12 8 - 23 mg/dL   Creatinine, Ser 1.61 0.61 - 1.24 mg/dL   Calcium 8.8 (L) 8.9 - 10.3 mg/dL   Total Protein 6.7 6.5 - 8.1 g/dL   Albumin 3.5 3.5  - 5.0 g/dL   AST 29 15 - 41 U/L   ALT 28 0 - 44 U/L   Alkaline Phosphatase 62 38 - 126 U/L   Total Bilirubin 0.4 0.3 - 1.2 mg/dL   GFR, Estimated >09 >60 mL/min   Anion gap 9 5 - 15  CBC with Differential     Status: Abnormal   Collection Time: 07/12/22  7:30 PM  Result Value Ref Range   WBC 6.0 4.0 - 10.5 K/uL   RBC 4.49 4.22 - 5.81 MIL/uL   Hemoglobin 12.6 (L) 13.0 - 17.0 g/dL   HCT 45.4 (L)  39.0 - 52.0 %   MCV 81.3 80.0 - 100.0 fL   MCH 28.1 26.0 - 34.0 pg   MCHC 34.5 30.0 - 36.0 g/dL   RDW 67.6 19.5 - 09.3 %   Platelets 260 150 - 400 K/uL   nRBC 0.0 0.0 - 0.2 %   Neutrophils Relative % 56 %   Neutro Abs 3.3 1.7 - 7.7 K/uL   Lymphocytes Relative 31 %   Lymphs Abs 1.9 0.7 - 4.0 K/uL   Monocytes Relative 9 %   Monocytes Absolute 0.6 0.1 - 1.0 K/uL   Eosinophils Relative 4 %   Eosinophils Absolute 0.2 0.0 - 0.5 K/uL   Basophils Relative 0 %   Basophils Absolute 0.0 0.0 - 0.1 K/uL   Immature Granulocytes 0 %   Abs Immature Granulocytes 0.02 0.00 - 0.07 K/uL  Blood culture (routine x 2)     Status: None (Preliminary result)   Collection Time: 07/12/22  7:30 PM   Specimen: BLOOD RIGHT FOREARM  Result Value Ref Range   Specimen Description      BLOOD RIGHT FOREARM DISTAL BOTTLES DRAWN AEROBIC AND ANAEROBIC   Special Requests Blood Culture adequate volume    Culture      NO GROWTH < 12 HOURS Performed at [facility], [address] [place], [place] [address]    Report Status PENDING   Blood culture (routine x 2)     Status: None (Preliminary result)   Collection Time: 07/12/22  7:30 PM   Specimen: BLOOD RIGHT FOREARM  Result Value Ref Range   Specimen Description      BLOOD RIGHT FOREARM PROXIMAL BOTTLES DRAWN AEROBIC AND ANAEROBIC   Special Requests Blood Culture adequate volume    Culture      NO GROWTH < 12 HOURS Performed at [facility], [address] [place], [place] [address]    Report Status PENDING   Lactic acid, plasma     Status: Abnormal   Collection  Time: 07/12/22 10:07 PM  Result Value Ref Range   Lactic Acid, Venous 2.2 (HH) 0.5 - 1.9 mmol/L  CBG monitoring, ED     Status: Abnormal   Collection Time: 07/13/22  4:32 AM  Result Value Ref Range   Glucose-Capillary 107 (H) 70 - 99 mg/dL  Procalcitonin - Baseline     Status: None   Collection Time: 07/13/22  5:15 AM  Result Value Ref Range   Procalcitonin <0.10 ng/mL  Lactic acid, plasma     Status: None   Collection Time: 07/13/22  5:15 AM  Result Value Ref Range   Lactic Acid, Venous 1.3 0.5 - 1.9 mmol/L  Comprehensive metabolic panel     Status: Abnormal   Collection Time: 07/13/22  5:15 AM  Result Value Ref Range   Sodium 140 135 - 145 mmol/L   Potassium 3.5 3.5 - 5.1 mmol/L   Chloride 108 98 - 111 mmol/L   CO2 25 22 - 32 mmol/L   Glucose, Bld 95 70 - 99 mg/dL   BUN 12 8 - 23 mg/dL   Creatinine, Ser 9.83 0.61 - 1.24 mg/dL   Calcium 8.0 (L) 8.9 - 10.3 mg/dL   Total Protein 5.2 (L) 6.5 - 8.1 g/dL   Albumin 2.7 (L) 3.5 - 5.0 g/dL   AST 24 15 - 41 U/L   ALT 21 0 - 44 U/L   Alkaline Phosphatase 54 38 - 126 U/L   Total Bilirubin 0.2 (L) 0.3 - 1.2 mg/dL   GFR,  Estimated >60 >60 mL/min   Anion gap 7 5 - 15  Magnesium     Status: None   Collection Time: 07/13/22  5:15 AM  Result Value Ref Range   Magnesium 2.0 1.7 - 2.4 mg/dL  CBC with Differential/Platelet     Status: Abnormal   Collection Time: 07/13/22  5:15 AM  Result Value Ref Range   WBC 6.0 4.0 - 10.5 K/uL   RBC 3.89 (L) 4.22 - 5.81 MIL/uL   Hemoglobin 10.8 (L) 13.0 - 17.0 g/dL   HCT 36.6 (L) 44.0 - 34.7 %   MCV 85.3 80.0 - 100.0 fL   MCH 27.8 26.0 - 34.0 pg   MCHC 32.5 30.0 - 36.0 g/dL   RDW 42.5 95.6 - 38.7 %   Platelets 205 150 - 400 K/uL   nRBC 0.0 0.0 - 0.2 %   Neutrophils Relative % 44 %   Neutro Abs 2.6 1.7 - 7.7 K/uL   Lymphocytes Relative 37 %   Lymphs Abs 2.2 0.7 - 4.0 K/uL   Monocytes Relative 10 %   Monocytes Absolute 0.6 0.1 - 1.0 K/uL   Eosinophils Relative 8 %   Eosinophils Absolute 0.5 0.0  - 0.5 K/uL   Basophils Relative 0 %   Basophils Absolute 0.0 0.0 - 0.1 K/uL   Immature Granulocytes 1 %   Abs Immature Granulocytes 0.03 0.00 - 0.07 K/uL    I have Reviewed nursing notes, Vitals, and Lab results since pt's last encounter. Pertinent lab results : see above I have ordered labwork to follow up on.  I have reviewed the last note from staff over past 24 hours I have discussed pt's care plan and test results with nursing staff, CM/SW, and other staff as appropriate  Time spent: Greater than 50% of the 55 minute visit was spent in counseling/coordination of care for the patient as laid out in the A&P.   LOS: 0 days   Lewie Chamber, [facility] 07/13/2022, 1:54 PM

## 2022-07-13 NOTE — ED Notes (Signed)
Family at bedside. Kort Stettler Delando Satter,RN 

## 2022-07-13 NOTE — Assessment & Plan Note (Signed)
Continue Protonix °

## 2022-07-13 NOTE — Assessment & Plan Note (Signed)
-   Smokes half a pack per day - Request nicotine patch - Counseled on the importance of cessation - Continue to monitor

## 2022-07-13 NOTE — Assessment & Plan Note (Addendum)
-   Continue Cymbalta - Continue oxycodone

## 2022-07-13 NOTE — H&P (Signed)
History and Physical    Patient: Jeffery Gross:[id number] DOB: 1961/01/18 DOA: 07/12/2022 DOS: the patient was seen and examined on 07/13/2022 PCP: Salli Real, MD  Patient coming from: Home  Chief Complaint:  Chief Complaint  Patient presents with   Post-op Problem   HPI: Jeffery Gross is a 61 y.o. male with medical history significant of chronic back pain, bilateral AKA, DVT, GERD, hypertension-without any antihypertensives on home med list, and more presents the ED with a chief complaint of leg pain.  Patient reports that he has had swelling and increased pain in his right stump.  It started overnight on 12/22-12/23.  Patient reports that the pain gradually got worse.  Sometime during the day had a acute change to worsening pain, but he reports that he is good and of the time details 1.  The pain shoots from his back all the way down to his foot (phantom pain).  He reports that his stump is been swollen and erythematous.  It has been leaking serosanguineous fluid.  It has been malodorous at times, but he is not sure when.  He has not had a fever.  He has otherwise been in his normal state of health.  Patient just had revision to the stump on 07-04-2022.  Patient does smoke half a pack per day.  He request nicotine patch.  He has been counseled on cessation.  He drinks 1 beer per week.  He does use cocaine and used it recently.  He has full code. Review of Systems: As mentioned in the history of present illness. All other systems reviewed and are negative. Past Medical History:  Diagnosis Date   Back pain    Chest pain    Depression    DVT (deep venous thrombosis) (HCC)    GERD (gastroesophageal reflux disease)    History of kidney stones    passed   HTN (hypertension)    no longer has HTN   Past Surgical History:  Procedure Laterality Date   AMPUTATION Bilateral 01/09/2016   Procedure: Bilateral Above Knee Amputation, Apply Wound VAC;  Surgeon: Nadara Mustard, MD;  Location: [facility]  OR;  Service: Orthopedics;  Laterality: Bilateral;   FASCIOTOMY Bilateral 01/03/2016   Procedure: FASCIOTOMY;  Surgeon: Tarry Kos, MD;  Location: [facility] OR;  Service: Orthopedics;  Laterality: Bilateral;   I & D EXTREMITY Bilateral 01/05/2016   Procedure: IRRIGATION AND DEBRIDEMENT BILATERAL LOWER EXTREMITIES; WOUND VAC CHANGE;  Surgeon: Tarry Kos, MD;  Location: [facility] OR;  Service: Orthopedics;  Laterality: Bilateral;   I & D EXTREMITY Left 01/07/2016   Procedure: Irrigation and Debridement of Left Lower Extremity with Application of Wound Vac;  Surgeon: Tarry Kos, MD;  Location: [facility] OR;  Service: Orthopedics;  Laterality: Left;   STUMP REVISION Right 07/04/2022   Procedure: REVISION RIGHT ABOVE KNEE AMPUTATION;  Surgeon: Nadara Mustard, MD;  Location: [facility] OR;  Service: Orthopedics;  Laterality: Right;   VASECTOMY     Social History:  reports that he has been smoking cigarettes. He has a 1.25 pack-year smoking history. He uses smokeless tobacco. He reports current alcohol use. He reports that he does not currently use drugs.  Allergies  Allergen Reactions   Sulfacetamide Sodium Other (See Comments)    edema    Family History  Problem Relation Age of Onset   Hypertension Father     Prior to Admission medications   Medication Sig Start Date End Date Taking? Authorizing Provider  doxycycline (  VIBRA-TABS) 100 MG tablet Take 1 tablet (100 mg total) by mouth 2 (two) times daily. 06/27/22   Newt Minion, MD  DULoxetine (CYMBALTA) 60 MG capsule Take 60 mg by mouth daily.    [provider]  gabapentin (NEURONTIN) 300 MG capsule Take 300 mg by mouth 3 (three) times daily. 05/16/22   [provider]  HYDROmorphone (DILAUDID) 2 MG tablet Take 1 tablet (2 mg total) by mouth every 6 (six) hours as needed for severe pain. 07/08/22   Newt Minion, MD  naloxone [facility]) nasal spray 4 mg/0.1 mL Place 1 spray into the nose as needed (opioid reversal).    [provider]   nicotine (NICODERM CQ - DOSED IN MG/24 HOURS) 21 mg/24hr patch Place 1 patch (21 mg total) onto the skin daily. 06/15/22   Barton Dubois, MD  omeprazole (PRILOSEC) 20 MG capsule Take 20 mg by mouth daily. 03/19/22   [provider]  Oxycodone HCl 20 MG TABS Take 1 tablet (20 mg total) by mouth 5 (five) times daily. 07/01/22   Newt Minion, MD  Vitamin D, Ergocalciferol, (DRISDOL) 1.25 MG (50000 UNIT) CAPS capsule Take 50,000 Units by mouth every Friday. 04/30/22   [provider]    Physical Exam: Vitals:   07/12/22 2343 07/13/22 0030 07/13/22 0140 07/13/22 0200  BP:  (!) 113/92  103/80  Pulse:  81    Resp:      Temp: 97.9 F (36.6 C)  (!) 97.2 F (36.2 C)   TempSrc: Oral  Axillary   SpO2:  97%    Weight:      Height:       1.  General: Patient lying supine in bed,  no acute distress   2. Psychiatric: Alert and oriented x 3, mood and behavior normal for situation, pleasant and cooperative with exam   3. Neurologic: Speech and language are normal, face is symmetric, moves all 4 extremities voluntarily, at baseline without acute deficits on limited exam   4. HEENMT:  Head is atraumatic, normocephalic, pupils reactive to light, neck is supple, trachea is midline, mucous membranes are moist   5. Respiratory : Lungs are clear to auscultation bilaterally without wheezing, rhonchi, rales, no cyanosis, no increase in work of breathing or accessory muscle use   6. Cardiovascular : Heart rate normal, rhythm is regular, no murmurs, rubs or gallops, no peripheral edema, peripheral pulses palpated   7. Gastrointestinal:  Abdomen is soft, nondistended, nontender to palpation bowel sounds active, no masses or organomegaly palpated   8. Skin:  Erythema serosanguineous drainage right stump   9.Musculoskeletal:  Bilateral AKA  Data Reviewed: In the ED Temp 98.1, heart rate 91, respiratory rate 18, blood pressure 152/101, satting 98% No leukocytosis with white  blood cell count of 6.0, hemoglobin 12.6, platelets 260 Chemistry unremarkable Lactic acid uptrending 1.2, 2.2 Blood culture pending CT lower extremity right shows fluid collection in the right stump no osteomyelitis Ortho was consulted and recommends 24 hours of Ancef Ortho did not believe that patient needed to be seen by the surgical team as this is nonsurgical Admission requested for further management of cellulitis of the right stump likely abscess  Assessment and Plan: * Cellulitis - At surgical site on right stump - Ortho consulted and recommended 24 hours of IV Ancef - Ortho recommended keeping patient here any pending, think this is a nonsurgical intervention, reporting that Ortho does not need to be consulted - Patient has no leukocytosis, does not  meet sepsis criteria - She did have an uptrending lactic acid from 1.2>> 2.2 - Continue IV fluids, trend lactic acid with morning labs - Blood cultures pending - Continue to monitor  Substance abuse ([place]) - Last used cocaine recently but does not remember which day - TOC consulted - Continue to monitor -  Tobacco use disorder - Smokes half a pack per day - Request nicotine patch - Counseled on the importance of cessation - Continue to monitor  Lactic acid acidosis - Uptrending from 1.2>> 2.2 - Blood cultures pending - Does not meet sepsis criteria - Likely related to infection - Continue IV fluids and trend in the a.m.  GERD (gastroesophageal reflux disease) - Continue Protonix  Chronic low back pain - Continue Cymbalta - Continue oxycodone - Pain scale for pain control      Advance Care Planning:   Code Status: Full Code  Consults: Ortho consulted from ED and advises that patient will not need surgical team consult  Family Communication: Daughter at bedside  Severity of Illness: The appropriate patient status for this patient is OBSERVATION. Observation status is judged to be reasonable and necessary in  order to provide the required intensity of service to ensure the patient's safety. The patient's presenting symptoms, physical exam findings, and initial radiographic and laboratory data in the context of their medical condition is felt to place them at decreased risk for further clinical deterioration. Furthermore, it is anticipated that the patient will be medically stable for discharge from the hospital within 2 midnights of admission.   Author: Rolla Plate, DO 07/13/2022 2:50 AM  For on call review [email or web address].

## 2022-07-13 NOTE — Assessment & Plan Note (Addendum)
-   Last used cocaine recently but does not remember which day - TOC consulted - Continue to monitor

## 2022-07-13 NOTE — Assessment & Plan Note (Addendum)
-   Mild erythema but no different when compared with picture from his postop on his phone.  He does have increased pain at the site and ongoing edema although technically it seems to actually be less than after surgery.  There is no overt pus either.  He also remains afebrile and still no leukocytosis - Continue Ancef and continue monitoring clinically - Elevate right lower extremity as able - Discussed with orthopedic surgery on 07/13/2022.  Only if patient develops purulent drainage would he require further invasive intervention, otherwise supportive care, antibiotics, and outpatient follow-up as already planned -Continue pain control

## 2022-07-13 NOTE — Assessment & Plan Note (Addendum)
-   Suspected due to underlying cellulitis - Responded to fluids

## 2022-07-14 DIAGNOSIS — L03115 Cellulitis of right lower limb: Secondary | ICD-10-CM | POA: Diagnosis not present

## 2022-07-14 MED ORDER — OXYCODONE HCL 5 MG PO TABS
20.0000 mg | ORAL_TABLET | Freq: Four times a day (QID) | ORAL | Status: DC | PRN
  Administered 2022-07-14 – 2022-07-16 (×5): 20 mg via ORAL
  Filled 2022-07-14 (×6): qty 4

## 2022-07-14 NOTE — Progress Notes (Signed)
Progress Note    DOCTOR SHEAHAN   LRJ:[id number]  DOB: 06/24/1961  DOA: 07/12/2022     1 PCP: Salli Real, MD  Initial CC: Right lower extremity pain  Hospital Course: Jeffery Gross is a 61 yo male with PMH B/L AKA, DVT, HTN, depression, GERD who presented with RLE pain.  He reports that he has had increased swelling in his right stump after recent surgery on 07/04/2022 that involved revision of the AKA and further debridement of bone due to osteomyelitis.  The original amputation was approximately 2017. He has been living in a motel and/or with friends since discharge and it is not clear if he was taking the doxycycline at discharge. He endorses that the stump has drained some serosanguineous fluid but no overt pus by his description.  He also denied any fevers, chills, sweats. He was afebrile on admission with no leukocytosis.  Case was discussed with orthopedic surgery and he was recommended for admission for IV antibiotics and monitoring. CT was obtained as well which showed "peripherally enhancing low intermediate density fluid collection about the distal femoral stump within the anterior compartment musculature."  This was also discussed with orthopedic surgery the morning following admission and is considered expected in setting of the postop course.  As long as patient has no overt pus, no surgical intervention is indicated.  Interval History:  No events overnight. Pain better some today. No purulent drainage.   Assessment and Plan: * Cellulitis - Mild erythema but no different when compared with picture from his postop on his phone.  He does have increased pain at the site and ongoing edema although technically it seems to actually be less than after surgery.  There is no overt pus either.  He also remains afebrile and still no leukocytosis - Continue Ancef and continue monitoring clinically - Elevate right lower extremity as able - Discussed with orthopedic surgery on 07/13/2022.   Only if patient develops purulent drainage would he require further invasive intervention, otherwise supportive care, antibiotics, and outpatient follow-up as already planned -Continue pain control  Lactic acid acidosis-resolved as of 07/13/2022 - Suspected due to underlying cellulitis - Responded to fluids  Substance abuse (HCC) - Last used cocaine recently but does not remember which day - TOC consulted - Continue to monitor  Tobacco use disorder - Smokes half a pack per day - Request nicotine patch - Counseled on the importance of cessation - Continue to monitor  GERD (gastroesophageal reflux disease) - Continue Protonix  Chronic low back pain - Continue Cymbalta - Continue oxycodone   Old records reviewed in assessment of this patient  Antimicrobials: Ancef 07/12/2022 >> current  DVT prophylaxis:  heparin injection 5,000 Units Start: 07/13/22 0600 SCDs Start: 07/12/22 2345   Code Status:   Code Status: Full Code  Mobility Assessment (last 72 hours)     Mobility Assessment     Row Name 07/13/22 0130           Does patient have an order for bedrest or is patient medically unstable No - Continue assessment       What is the highest level of mobility based on the progressive mobility assessment? Level 2 (Chairfast) - Balance while sitting on edge of bed and cannot stand       Is the above level different from baseline mobility prior to current illness? Yes - Recommend PT order                Barriers to discharge:  Disposition Plan: Home Tues Status is: Inpatient  Objective: Blood pressure 113/74, pulse 67, temperature 97.9 F (36.6 C), temperature source Oral, resp. rate 20, height 4' (1.219 m), weight 70.3 kg, SpO2 97 %.  Examination:  Physical Exam Constitutional:      General: He is not in acute distress.    Appearance: Normal appearance.  HENT:     Head: Normocephalic and atraumatic.     Mouth/Throat:     Mouth: Mucous membranes are moist.   Eyes:     Extraocular Movements: Extraocular movements intact.  Cardiovascular:     Rate and Rhythm: Normal rate and regular rhythm.     Heart sounds: Normal heart sounds.  Pulmonary:     Effort: Pulmonary effort is normal. No respiratory distress.     Breath sounds: Normal breath sounds. No wheezing.  Abdominal:     General: Bowel sounds are normal. There is no distension.     Palpations: Abdomen is soft.     Tenderness: There is no abdominal tenderness.  Musculoskeletal:     Cervical back: Normal range of motion and neck supple.     Comments: Right stump noted with edema, mild erythema along suture sites.  No purulent drainage.  Prior evidence of some serosanguineous drainage noted around the medial sutures.  No significant calor.  Tenderness seems appropriate s/p surgery and or worse 2/2 edema  Skin:    General: Skin is warm and dry.  Neurological:     General: No focal deficit present.     Mental Status: He is alert.  Psychiatric:        Mood and Affect: Mood normal.        Behavior: Behavior normal.   Pic taken 12/24:       Consultants:    Procedures:    Data Reviewed: No results found for this or any previous visit (from the past 24 hour(s)).   I have Reviewed nursing notes, Vitals, and Lab results since pt's last encounter. Pertinent lab results : see above I have ordered labwork to follow up on.  I have reviewed the last note from staff over past 24 hours I have discussed pt's care plan and test results with nursing staff, CM/SW, and other staff as appropriate  Time spent: Greater than 50% of the 55 minute visit was spent in counseling/coordination of care for the patient as laid out in the A&P.   LOS: 1 day   Jeffery Chamber, MD [facility] 07/14/2022, 12:18 PM

## 2022-07-14 NOTE — TOC Progression Note (Signed)
Transition of Care [facility]) - Progression Note    Patient Details  Name: Jeffery Gross MRN: [id number] Date of Birth: Jul 21, 1961  Transition of [facility]) CM/SW Contact  Elliot Gault, LCSW Phone Number: 07/14/2022, 11:39 AM  Clinical Narrative:     TOC following. MD anticipating dc tomorrow. TOC brought SA treatment resource list along with [employer] for SDOH concerns to pt bedside today.  Will follow.  Expected Discharge Plan: Home/Self Care Barriers to Discharge: Continued Medical Work up  Expected Discharge Plan and Services In-house Referral: Clinical Social Work     Living arrangements for the past 2 months: Single Family Home                                       Social Determinants of Health (SDOH) Interventions SDOH Screenings   Food Insecurity: Food Insecurity Present (07/13/2022)  Housing: Low Risk  (07/13/2022)  Transportation Needs: No Transportation Needs (07/13/2022)  Utilities: Not At Risk (07/13/2022)  Tobacco Use: High Risk (07/12/2022)    Readmission Risk Interventions     No data to display

## 2022-07-14 NOTE — Plan of Care (Signed)
  Problem: Education: Goal: Knowledge of the prescribed therapeutic regimen will improve Outcome: Progressing Goal: Ability to verbalize activity precautions or restrictions will improve Outcome: Progressing Goal: Understanding of discharge needs will improve Outcome: Progressing   Problem: Activity: Goal: Ability to perform//tolerate increased activity and mobilize with assistive devices will improve Outcome: Progressing   Problem: Clinical Measurements: Goal: Postoperative complications will be avoided or minimized Outcome: Progressing   Problem: Self-Care: Goal: Ability to meet self-care needs will improve Outcome: Progressing   Problem: Self-Concept: Goal: Ability to maintain and perform role responsibilities to the fullest extent possible will improve Outcome: Progressing   Problem: Pain Management: Goal: Pain level will decrease with appropriate interventions Outcome: Progressing   Problem: Skin Integrity: Goal: Demonstration of wound healing without infection will improve Outcome: Progressing   Problem: Clinical Measurements: Goal: Ability to avoid or minimize complications of infection will improve Outcome: Progressing   Problem: Skin Integrity: Goal: Skin integrity will improve Outcome: Progressing   Problem: Education: Goal: Knowledge of General Education information will improve Description: Including pain rating scale, medication(s)/side effects and non-pharmacologic comfort measures Outcome: Progressing   Problem: Health Behavior/Discharge Planning: Goal: Ability to manage health-related needs will improve Outcome: Progressing   Problem: Clinical Measurements: Goal: Ability to maintain clinical measurements within normal limits will improve Outcome: Progressing Goal: Will remain free from infection Outcome: Progressing Goal: Diagnostic test results will improve Outcome: Progressing Goal: Respiratory complications will improve Outcome:  Progressing Goal: Cardiovascular complication will be avoided Outcome: Progressing   Problem: Activity: Goal: Risk for activity intolerance will decrease Outcome: Progressing   Problem: Nutrition: Goal: Adequate nutrition will be maintained Outcome: Progressing   Problem: Coping: Goal: Level of anxiety will decrease Outcome: Progressing   Problem: Elimination: Goal: Will not experience complications related to bowel motility Outcome: Progressing Goal: Will not experience complications related to urinary retention Outcome: Progressing   Problem: Pain Managment: Goal: General experience of comfort will improve Outcome: Progressing   Problem: Safety: Goal: Ability to remain free from injury will improve Outcome: Progressing   Problem: Skin Integrity: Goal: Risk for impaired skin integrity will decrease Outcome: Progressing

## 2022-07-15 DIAGNOSIS — L03115 Cellulitis of right lower limb: Secondary | ICD-10-CM | POA: Diagnosis not present

## 2022-07-15 MED ORDER — POLYETHYLENE GLYCOL 3350 17 G PO PACK
17.0000 g | PACK | Freq: Every day | ORAL | Status: DC
  Administered 2022-07-15 – 2022-07-16 (×2): 17 g via ORAL
  Filled 2022-07-15 (×2): qty 1

## 2022-07-15 MED ORDER — LACTULOSE 10 GM/15ML PO SOLN: 30.0000 g | Freq: Two times a day (BID) | ORAL | Status: DC | PRN

## 2022-07-15 MED ORDER — SENNOSIDES-DOCUSATE SODIUM 8.6-50 MG PO TABS
1.0000 | ORAL_TABLET | Freq: Two times a day (BID) | ORAL | Status: DC
  Administered 2022-07-15 – 2022-07-16 (×3): 1 via ORAL
  Filled 2022-07-15 (×3): qty 1

## 2022-07-15 MED ORDER — DOXYCYCLINE HYCLATE 100 MG PO TABS: 100.0000 mg | ORAL_TABLET | Freq: Two times a day (BID) | ORAL | 0 refills | Status: AC

## 2022-07-15 NOTE — Progress Notes (Signed)
Progress Note    Jeffery Gross   WLN:[id number]  DOB: [birth year]  DOA: 07/12/2022     2 PCP: Salli Real, MD  Initial CC: Right lower extremity pain  Hospital Course: Jeffery Gross is a 61 yo male with PMH B/L AKA, DVT, HTN, depression, GERD who presented with RLE pain.  He reports that he has had increased swelling in his right stump after recent surgery on 07/04/2022 that involved revision of the AKA and further debridement of bone due to osteomyelitis.  The original amputation was approximately 2017. He has been living in a motel and/or with friends since discharge and it is not clear if he was taking the doxycycline at discharge. He endorses that the stump has drained some serosanguineous fluid but no overt pus by his description.  He also denied any fevers, chills, sweats. He was afebrile on admission with no leukocytosis.  Case was discussed with orthopedic surgery and he was recommended for admission for IV antibiotics and monitoring. CT was obtained as well which showed "peripherally enhancing low intermediate density fluid collection about the distal femoral stump within the anterior compartment musculature."  This was also discussed with orthopedic surgery the morning following admission and is considered expected in setting of the postop course.  As long as patient has no overt pus, no surgical intervention is indicated.  Interval History:  No events overnight. Pain better some today. No purulent drainage. Starting to drain more today of serosanguineous fluid and thigh is much softer.   Assessment and Plan: * Cellulitis - Mild erythema but no different when compared with picture from his postop on his phone.  He does have increased pain at the site and ongoing edema although technically it seems to actually be less than after surgery.  There is no overt pus either.  He also remains afebrile and still no leukocytosis - Continue Ancef and continue monitoring clinically - Elevate right  lower extremity as able - Discussed with orthopedic surgery on 07/13/2022.  Only if patient develops purulent drainage would he require further invasive intervention, otherwise supportive care, antibiotics, and outpatient follow-up as already planned -Continue pain control  Lactic acid acidosis-resolved as of 07/13/2022 - Suspected due to underlying cellulitis - Responded to fluids  Substance abuse (HCC) - Last used cocaine recently but does not remember which day - TOC consulted - Continue to monitor  Tobacco use disorder - Smokes half a pack per day - Request nicotine patch - Counseled on the importance of cessation - Continue to monitor  GERD (gastroesophageal reflux disease) - Continue Protonix  Chronic low back pain - Continue Cymbalta - Continue oxycodone   Old records reviewed in assessment of this patient  Antimicrobials: Ancef 07/12/2022 >> current  DVT prophylaxis:  heparin injection 5,000 Units Start: 07/13/22 0600 SCDs Start: 07/12/22 2345   Code Status:   Code Status: Full Code  Mobility Assessment (last 72 hours)     Mobility Assessment     Row Name 07/14/22 2000 07/13/22 0130         Does patient have an order for bedrest or is patient medically unstable No - Continue assessment No - Continue assessment      What is the highest level of mobility based on the progressive mobility assessment? Level 2 (Chairfast) - Balance while sitting on edge of bed and cannot stand Level 2 (Chairfast) - Balance while sitting on edge of bed and cannot stand      Is the above level different from  baseline mobility prior to current illness? Yes - Recommend PT order Yes - Recommend PT order               Barriers to discharge:  Disposition Plan: Home Tues Status is: Inpatient  Objective: Blood pressure 119/77, pulse 66, temperature 97.8 F (36.6 C), temperature source Oral, resp. rate 17, height 4' (1.219 m), weight 70.3 kg, SpO2 96 %.  Examination:   Physical Exam Constitutional:      General: He is not in acute distress.    Appearance: Normal appearance.  HENT:     Head: Normocephalic and atraumatic.     Mouth/Throat:     Mouth: Mucous membranes are moist.  Eyes:     Extraocular Movements: Extraocular movements intact.  Cardiovascular:     Rate and Rhythm: Normal rate and regular rhythm.     Heart sounds: Normal heart sounds.  Pulmonary:     Effort: Pulmonary effort is normal. No respiratory distress.     Breath sounds: Normal breath sounds. No wheezing.  Abdominal:     General: Bowel sounds are normal. There is no distension.     Palpations: Abdomen is soft.     Tenderness: There is no abdominal tenderness.  Musculoskeletal:     Cervical back: Normal range of motion and neck supple.     Comments: Right stump noted with greatly improved edema, now minimal. No erythema except along suture line consistent with appropriate healing. Some increase serosang drainage noted at medial sutures where prior drainge noted  Skin:    General: Skin is warm and dry.  Neurological:     General: No focal deficit present.     Mental Status: He is alert.  Psychiatric:        Mood and Affect: Mood normal.        Behavior: Behavior normal.    Consultants:    Procedures:    Data Reviewed: No results found for this or any previous visit (from the past 24 hour(s)).   I have Reviewed nursing notes, Vitals, and Lab results since pt's last encounter. Pertinent lab results : see above I have ordered labwork to follow up on.  I have reviewed the last note from staff over past 24 hours I have discussed pt's care plan and test results with nursing staff, CM/SW, and other staff as appropriate  Time spent: Greater than 50% of the 55 minute visit was spent in counseling/coordination of care for the patient as laid out in the A&P.   LOS: 2 days   Lewie Chamber, MD [facility] 07/15/2022, 2:05 PM

## 2022-07-16 DIAGNOSIS — T8140XA Infection following a procedure, unspecified, initial encounter: Secondary | ICD-10-CM

## 2022-07-16 DIAGNOSIS — L0291 Cutaneous abscess, unspecified: Secondary | ICD-10-CM

## 2022-07-16 DIAGNOSIS — L0390 Cellulitis, unspecified: Secondary | ICD-10-CM

## 2022-07-16 DIAGNOSIS — F1910 Other psychoactive substance abuse, uncomplicated: Secondary | ICD-10-CM | POA: Diagnosis not present

## 2022-07-16 DIAGNOSIS — F17200 Nicotine dependence, unspecified, uncomplicated: Secondary | ICD-10-CM | POA: Diagnosis not present

## 2022-07-16 MED ORDER — CEPHALEXIN 500 MG PO CAPS: 500.0000 mg | ORAL_CAPSULE | Freq: Three times a day (TID) | ORAL | 0 refills | Status: AC

## 2022-07-16 NOTE — Progress Notes (Signed)
Discharge paperwork given to pt.

## 2022-07-16 NOTE — Discharge Instructions (Addendum)
1) please take cephalexin/Keflex AND doxycycline antibiotics as prescribed for right stump infection 2) please follow-up with primary care physician Salli Real, MD  within a week for recheck-- 3) follow-up with orthopedic surgeon Dr. Lajoyce Corners as previously advised

## 2022-07-16 NOTE — Progress Notes (Addendum)
Pt calm and cooperative during overnight shift.  BP remains soft but appears to be his trend. PRN Oxycodone given. No acute events over night. Wardell Heath Gerrianne Scale

## 2022-07-16 NOTE — Progress Notes (Signed)
Nsg Discharge Note  Admit Date:  07/12/2022 Discharge date: 07/16/2022   Rip Harbour to be D/C'd Home per MD order.  AVS completed.  Copy for chart, and copy for patient signed, and dated. Patient/caregiver able to verbalize understanding.  Discharge Medication: Allergies as of 07/16/2022       Reactions   Sulfacetamide Sodium Swelling   Edema         Medication List     TAKE these medications    cephALEXin 500 MG capsule Commonly known as: Keflex Take 1 capsule (500 mg total) by mouth 3 (three) times daily for 5 days.   doxycycline 100 MG tablet Commonly known as: VIBRA-TABS Take 1 tablet (100 mg total) by mouth 2 (two) times daily for 7 days.   DULoxetine 60 MG capsule Commonly known as: CYMBALTA Take 60 mg by mouth in the morning.   gabapentin 300 MG capsule Commonly known as: NEURONTIN Take 300 mg by mouth 3 (three) times daily.   HYDROmorphone 2 MG tablet Commonly known as: Dilaudid Take 1 tablet (2 mg total) by mouth every 6 (six) hours as needed for severe pain.   nicotine 21 mg/24hr patch Commonly known as: NICODERM CQ - dosed in mg/24 hours Place 1 patch (21 mg total) onto the skin daily.   omeprazole 20 MG capsule Commonly known as: PRILOSEC Take 20 mg by mouth in the morning.   Oxycodone HCl 20 MG Tabs Take 1 tablet (20 mg total) by mouth 5 (five) times daily.   Vitamin D (Ergocalciferol) 1.25 MG (50000 UNIT) Caps capsule Commonly known as: DRISDOL Take 50,000 Units by mouth every Friday.        Discharge Assessment: Vitals:   07/16/22 0409 07/16/22 0410  BP: (!) 98/59   Pulse: (!) 56   Resp:  18  Temp:  97.9 F (36.6 C)  SpO2:  100%   Skin clean, dry and intact without evidence of skin break down, no evidence of skin tears noted. IV catheter discontinued intact. Site without signs and symptoms of complications - no redness or edema noted at insertion site, patient denies c/o pain - only slight tenderness at site.  Dressing with  slight pressure applied.  D/c Instructions-Education: Discharge instructions given to patient/family with verbalized understanding. D/c education completed with patient/family including follow up instructions, medication list, d/c activities limitations if indicated, with other d/c instructions as indicated by MD - patient able to verbalize understanding, all questions fully answered. Patient instructed to return to ED, call 911, or call MD for any changes in condition.  Patient escorted via WC, and D/C home via private auto.  Theodoro Clock Chrystie Hagwood,LPN [id number] 0:27 PM

## 2022-07-16 NOTE — Discharge Summary (Signed)
Jeffery Gross, is a 61 y.o. male  DOB 16-Nov-1960  MRN [id number].  Admission date:  07/12/2022  Admitting Physician  Rolla Plate, DO  Discharge Date:  07/16/2022   Primary MD  Sandi Mariscal, MD  Recommendations for primary care physician for things to follow:  1) please take cephalexin/Keflex AND doxycycline antibiotics as prescribed for right stump infection 2) please follow-up with primary care physician Sandi Mariscal, MD  within a week for recheck-- 3) follow-up with orthopedic surgeon Dr. Sharol Given as previously advised  Admission Diagnosis  Abscess [L02.91] Cellulitis [L03.90] Postoperative infection, unspecified type, initial encounter [T81.40XA]   Discharge Diagnosis  Abscess [L02.91] Cellulitis [L03.90] Postoperative infection, unspecified type, initial encounter [T81.40XA]    Principal Problem:   Cellulitis Active Problems:   Chronic low back pain   GERD (gastroesophageal reflux disease)   Tobacco use disorder   Substance abuse ([place])      Past Medical History:  Diagnosis Date   Back pain    Chest pain    Depression    DVT (deep venous thrombosis) (HCC)    GERD (gastroesophageal reflux disease)    History of kidney stones    passed   HTN (hypertension)    no longer has HTN    Past Surgical History:  Procedure Laterality Date   AMPUTATION Bilateral 01/09/2016   Procedure: Bilateral Above Knee Amputation, Apply Wound VAC;  Surgeon: Newt Minion, MD;  Location: [place];  Service: Orthopedics;  Laterality: Bilateral;   FASCIOTOMY Bilateral 01/03/2016   Procedure: FASCIOTOMY;  Surgeon: Leandrew Koyanagi, MD;  Location: [place];  Service: Orthopedics;  Laterality: Bilateral;   I & D EXTREMITY Bilateral 01/05/2016   Procedure: IRRIGATION AND DEBRIDEMENT BILATERAL LOWER EXTREMITIES; WOUND VAC CHANGE;  Surgeon: Leandrew Koyanagi, MD;  Location: [facility];  Service: Orthopedics;  Laterality: Bilateral;   I & D  EXTREMITY Left 01/07/2016   Procedure: Irrigation and Debridement of Left Lower Extremity with Application of Wound Vac;  Surgeon: Leandrew Koyanagi, MD;  Location: [facility];  Service: Orthopedics;  Laterality: Left;   STUMP REVISION Right 07/04/2022   Procedure: REVISION RIGHT ABOVE KNEE AMPUTATION;  Surgeon: Newt Minion, MD;  Location: [facility];  Service: Orthopedics;  Laterality: Right;   VASECTOMY       HPI  from the history and physical done on the day of admission:   HPI: Jeffery Gross is a 61 y.o. male with medical history significant of chronic back pain, bilateral AKA, DVT, GERD, hypertension-without any antihypertensives on home med list, and more presents the ED with a chief complaint of leg pain.  Patient reports that he has had swelling and increased pain in his right stump.  It started overnight on 12/22-12/23.  Patient reports that the pain gradually got worse.  Sometime during the day had a acute change to worsening pain, but he reports that he is good and of the time details 1.  The pain shoots from his back all the way down to his foot (  phantom pain).  He reports that his stump is been swollen and erythematous.  It has been leaking serosanguineous fluid.  It has been malodorous at times, but he is not sure when.  He has not had a fever.  He has otherwise been in his normal state of health.  Patient just had revision to the stump on 07-04-2022.   Patient does smoke half a pack per day.  He request nicotine patch.  He has been counseled on cessation.  He drinks 1 beer per week.  He does use cocaine and used it recently.  He has full code. Review of Systems: As mentioned in the history of present illness. All other systems reviewed and are negative.    Hospital Course:     Jeffery Gross is a 61 yo male with PMH B/L AKA, DVT, HTN, depression, GERD who presented with RLE pain.  He reports that he has had increased swelling in his right stump after recent surgery on 07/04/2022 that involved  revision of the AKA and further debridement of bone due to osteomyelitis.  The original amputation was approximately 2017. He has been living in a motel and/or with friends since discharge and it is not clear if he was taking the doxycycline at discharge. He endorses that the stump has drained some serosanguineous fluid but no overt pus by his description.  He also denied any fevers, chills, sweats. He was afebrile on admission with no leukocytosis.  Case was discussed with orthopedic surgery and he was recommended for admission for IV antibiotics and monitoring. CT was obtained as well which showed "peripherally enhancing low intermediate density fluid collection about the distal femoral stump within the anterior compartment musculature."  This was also discussed with orthopedic surgery the morning following admission and is considered expected in setting of the postop course.  As long as patient has no overt pus, no surgical intervention is indicated.  Assessment and Plan: 1)Right stump infection/cellulitis - -Overall improved, no purulent drainage -Afebrile without leukocytosis -Dr Carolynn Comment  Discussed with orthopedic surgery on 07/13/2022.   -Advised antibiotic management and outpatient follow-up Lactic acid acidosis-resolved as of 07/13/2022 - Suspected due to underlying cellulitis - Responded to fluids Rt AKA on 07/04/22--by Dr. Lajoyce Corners -Molli Knock to discharge on doxycycline and Keflex with outpatient follow-up with Dr. Lajoyce Corners  Substance abuse [facility]) - Last used cocaine recently but does not remember which day --Sadly patient is Not ready to quit using drugs  Tobacco use disorder - -Smoking patient advised okay to use OTC nicotine patch  GERD (gastroesophageal reflux disease) - Continue Protonix  Chronic low back pain - Continue Cymbalta - Continue oxycodone  Discharge Condition: stable  Follow UP   Follow-up Information     Salli Real, MD. Schedule an appointment as soon as possible for  a visit in 1 week(s).   Specialty: Internal Medicine Contact information: [address] [place] [address] [phone]         Nadara Mustard, MD. Schedule an appointment as soon as possible for a visit in 1 week(s).   Specialty: Orthopedic Surgery Contact information: [address] [place] [address] [phone]                 Consults obtained - Ortho phone consult  Diet and Activity recommendation:  As advised  Discharge Instructions    Discharge Instructions     Call MD for:  difficulty breathing, headache or visual disturbances   Complete by: As directed    Call MD  for:  persistant dizziness or light-headedness   Complete by: As directed    Call MD for:  persistant nausea and vomiting   Complete by: As directed    Call MD for:  redness, tenderness, or signs of infection (pain, swelling, redness, odor or green/yellow discharge around incision site)   Complete by: As directed    Call MD for:  temperature >100.4   Complete by: As directed    Diet - low sodium heart healthy   Complete by: As directed    Discharge instructions   Complete by: As directed    1) please take cephalexin/Keflex AND doxycycline antibiotics as prescribed for right stump infection 2) please follow-up with primary care physician Sandi Mariscal, MD  within a week for recheck-- 3) follow-up with orthopedic surgeon Dr. Sharol Given as previously advised   Increase activity slowly   Complete by: As directed          Discharge Medications     Allergies as of 07/16/2022       Reactions   Sulfacetamide Sodium Swelling   Edema         Medication List     TAKE these medications    cephALEXin 500 MG capsule Commonly known as: Keflex Take 1 capsule (500 mg total) by mouth 3 (three) times daily for 5 days.   doxycycline 100 MG tablet Commonly known as: VIBRA-TABS Take 1 tablet (100 mg total) by mouth 2 (two) times daily for 7 days.   DULoxetine 60 MG capsule Commonly  known as: CYMBALTA Take 60 mg by mouth in the morning.   gabapentin 300 MG capsule Commonly known as: NEURONTIN Take 300 mg by mouth 3 (three) times daily.   HYDROmorphone 2 MG tablet Commonly known as: Dilaudid Take 1 tablet (2 mg total) by mouth every 6 (six) hours as needed for severe pain.   nicotine 21 mg/24hr patch Commonly known as: NICODERM CQ - dosed in mg/24 hours Place 1 patch (21 mg total) onto the skin daily.   omeprazole 20 MG capsule Commonly known as: PRILOSEC Take 20 mg by mouth in the morning.   Oxycodone HCl 20 MG Tabs Take 1 tablet (20 mg total) by mouth 5 (five) times daily.   Vitamin D (Ergocalciferol) 1.25 MG (50000 UNIT) Caps capsule Commonly known as: DRISDOL Take 50,000 Units by mouth every Friday.        Major procedures and Radiology Reports - PLEASE review detailed and final reports for all details, in brief -   CT EXTREMITY LOWER RIGHT W CONTRAST  Result Date: 07/12/2022 CLINICAL DATA:  Right knee amputation revision 4 days ago. New pain, swelling, and warmth at the incision site. Concern for infection. EXAM: CT OF THE LOWER RIGHT EXTREMITY WITH CONTRAST TECHNIQUE: Multidetector CT imaging of the lower right extremity was performed according to the standard protocol following intravenous contrast administration. RADIATION DOSE REDUCTION: This exam was performed according to the departmental dose-optimization program which includes automated exposure control, adjustment of the mA and/or kV according to patient size and/or use of iterative reconstruction technique. CONTRAST:  59mL OMNIPAQUE IOHEXOL 300 MG/ML  SOLN COMPARISON:  Radiographs 06/13/2022 FINDINGS: Bones/Joint/Cartilage Recent postoperative change of right femur above the knee amputation revision. No evidence of acute fracture or osteomyelitis. Degenerative arthritis right hip. Partially visualized lumbosacral fusion. Ligaments Suboptimally assessed by CT. Muscles and Tendons Low to  intermediate density peripherally enhancing bilobed fluid collection about both sides of the distal femoral stump within the anterior compartment musculature. This measures  approximately 9.2 x 3.0 x 5.7 cm and demonstrates peripheral enhancement. Fat density lesion within the proximal rectus femoris likely a benign lipoma. Soft tissues Soft tissue swelling about the distal right thigh at the amputation site greatest laterally where there is an additional small 1.8 cm loosely organized fluid collection (series 5/image 222). No soft tissue gas. IMPRESSION: Peripherally enhancing low-intermediate density fluid collection about the distal femoral stump within the anterior compartment musculature. This is nonspecific given recent postoperative change and could be a postoperative hematoma/seroma. Superimposed infection/abscess is not excluded. No evidence of osteomyelitis. Electronically Signed   By: Placido Sou M.D.   On: 07/12/2022 21:23    Micro Results   Recent Results (from the past 240 hour(s))  Blood culture (routine x 2)     Status: None (Preliminary result)   Collection Time: 07/12/22  7:30 PM   Specimen: BLOOD RIGHT FOREARM  Result Value Ref Range Status   Specimen Description   Final    BLOOD RIGHT FOREARM DISTAL BOTTLES DRAWN AEROBIC AND ANAEROBIC   Special Requests Blood Culture adequate volume  Final   Culture   Final    NO GROWTH 4 DAYS Performed at [facility], [address], [place], [place] [address]    Report Status PENDING  Incomplete  Blood culture (routine x 2)     Status: None (Preliminary result)   Collection Time: 07/12/22  7:30 PM   Specimen: BLOOD RIGHT FOREARM  Result Value Ref Range Status   Specimen Description   Final    BLOOD RIGHT FOREARM PROXIMAL BOTTLES DRAWN AEROBIC AND ANAEROBIC   Special Requests Blood Culture adequate volume  Final   Culture   Final    NO GROWTH 4 DAYS Performed at [facility], [address], [place], [place] [address]    Report  Status PENDING  Incomplete    Today   Subjective    Jeffery Gross today has no new complaints  No fever  Or chills   No Nausea, Vomiting or Diarrhea          Patient has been seen and examined prior to discharge   Objective   Blood pressure (!) 98/59, pulse (!) 56, temperature 97.9 F (36.6 C), temperature source Oral, resp. rate 18, height 4' (1.219 m), weight 70.3 kg, SpO2 100 %.   Intake/Output Summary (Last 24 hours) at 07/16/2022 1322 Last data filed at 07/16/2022 0900 Gross per 24 hour  Intake 840 ml  Output 1750 ml  Net -910 ml    Exam Gen:- Awake Alert, no acute distress  HEENT:- [place].AT, No sclera icterus Neck-Supple Neck,No JVD,.  Lungs-  CTAB , good air movement bilaterally CV- S1, S2 normal, regular Abd-  +ve B.Sounds, Abd Soft, No tenderness,    Extremity/Skin:- No  edema,   good pulses Psych-affect is appropriate, oriented x3 Neuro-no new focal deficits, no tremors  MSK-bilateral AKA, right AKA stump with much improved erythema, much improved improved swelling , no purulent drainage   Data Review   CBC w Diff:  Lab Results  Component Value Date   WBC 6.0 07/13/2022   HGB 10.8 (L) 07/13/2022   HCT 33.2 (L) 07/13/2022   PLT 205 07/13/2022   LYMPHOPCT 37 07/13/2022   MONOPCT 10 07/13/2022   EOSPCT 8 07/13/2022   BASOPCT 0 07/13/2022    CMP:  Lab Results  Component Value Date   NA 140 07/13/2022   K 3.5 07/13/2022   CL 108 07/13/2022   CO2 25 07/13/2022  BUN 12 07/13/2022   CREATININE 1.04 07/13/2022   PROT 5.2 (L) 07/13/2022   ALBUMIN 2.7 (L) 07/13/2022   BILITOT 0.2 (L) 07/13/2022   ALKPHOS 54 07/13/2022   AST 24 07/13/2022   ALT 21 07/13/2022  .  Total Discharge time is about 33 minutes  Roxan Hockey M.D on 07/16/2022 at 1:22 PM  Go to [email or web address] -  for contact info  [facility] - Office  [phone]

## 2022-07-17 LAB — CULTURE, BLOOD (ROUTINE X 2)
Culture: NO GROWTH
Culture: NO GROWTH
Special Requests: ADEQUATE
Special Requests: ADEQUATE

## 2022-07-24 ENCOUNTER — Ambulatory Visit (INDEPENDENT_AMBULATORY_CARE_PROVIDER_SITE_OTHER): Payer: Medicaid Other | Admitting: Orthopedic Surgery

## 2022-07-24 DIAGNOSIS — Z89612 Acquired absence of left leg above knee: Secondary | ICD-10-CM

## 2022-07-24 DIAGNOSIS — Z89611 Acquired absence of right leg above knee: Secondary | ICD-10-CM

## 2022-07-25 ENCOUNTER — Encounter: Payer: Self-pay | Admitting: Orthopedic Surgery

## 2022-07-25 NOTE — Progress Notes (Signed)
Office Visit Note   Patient: Jeffery Gross           Date of Birth: 1961/05/05           MRN: [id number] Visit Date: 07/24/2022              Requested by: Sandi Mariscal, [place],  [place] [address] PCP: Sandi Mariscal, MD  Chief Complaint  Patient presents with   Right Leg - Routine Post Op    07/04/22 right AKA revision      HPI: Patient is a 62 year old gentleman who is 3 weeks status post right above-the-knee amputation revision.  Patient has been on oral antibiotics.  Assessment & Plan: Visit Diagnoses:  1. S/P AKA (above knee amputation) bilateral (HCC)     Plan: Continue current wound care and antibiotics follow-up in 1 week to remove the sutures.  Follow-Up Instructions: Return in about 1 week (around 07/31/2022).   Ortho Exam  Patient is alert, oriented, no adenopathy, well-dressed, normal affect, normal respiratory effort. Examination the wound has healed well since his last visit.  There is no draining no cellulitis no signs of infection.  Imaging: No results found. No images are attached to the encounter.  Labs: Lab Results  Component Value Date   HGBA1C 5.8 (H) 06/14/2022   ESRSEDRATE 43 (H) 06/14/2022   CRP 4.3 (H) 06/14/2022   REPTSTATUS 07/17/2022 FINAL 07/12/2022   REPTSTATUS 07/17/2022 FINAL 07/12/2022   CULT  07/12/2022    NO GROWTH 5 DAYS Performed at [facility], [address], [place], [place] [address]    CULT  07/12/2022    NO GROWTH 5 DAYS Performed at [facility], [address], [place], [place] [address]      Lab Results  Component Value Date   ALBUMIN 2.7 (L) 07/13/2022   ALBUMIN 3.5 07/12/2022   ALBUMIN 3.7 07/04/2022   PREALBUMIN 29 07/04/2022   PREALBUMIN 18 06/14/2022    Lab Results  Component Value Date   MG 2.0 07/13/2022   MG 2.3 01/07/2016   Lab Results  Component Value Date   VD25OH 63.57 07/04/2022    Lab Results  Component Value Date   PREALBUMIN 29 07/04/2022   PREALBUMIN 18 06/14/2022       Latest Ref Rng & Units 07/13/2022    5:15 AM 07/12/2022    7:30 PM 07/04/2022    8:26 AM  CBC EXTENDED  WBC 4.0 - 10.5 K/uL 6.0  6.0  12.4   RBC 4.22 - 5.81 MIL/uL 3.89  4.49  5.06   Hemoglobin 13.0 - 17.0 g/dL 10.8  12.6  14.5   HCT 39.0 - 52.0 % 33.2  36.5  42.5   Platelets 150 - 400 K/uL 205  260  229   NEUT# 1.7 - 7.7 K/uL 2.6  3.3  8.0   Lymph# 0.7 - 4.0 K/uL 2.2  1.9  3.0      There is no height or weight on file to calculate BMI.  Orders:  No orders of the defined types were placed in this encounter.  No orders of the defined types were placed in this encounter.    Procedures: No procedures performed  Clinical Data: No additional findings.  ROS:  All other systems negative, except as noted in the HPI. Review of Systems  Objective: Vital Signs: There were no vitals taken for this visit.  Specialty Comments:  No specialty comments available.  PMFS History: Patient Active Problem List  Diagnosis Date Noted   Tobacco use disorder 07/13/2022   Substance abuse ([place]) 07/13/2022   Cellulitis 07/12/2022   GERD (gastroesophageal reflux disease) 07/12/2022   AKA stump complication ([place]) 84/13/2440   Wound dehiscence 07/04/2022   Hx of AKA (above knee amputation), right ([place]) 07/04/2022   History of MRSA infection 06/14/2022   Cellulitis of right lower extremity 06/14/2022   Infected wound 06/13/2022   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   Seasonal allergies 02/08/2016   Anemia due to other cause 02/08/2016   Chronic pain syndrome 02/08/2016   Depression 02/08/2016   Post-operative pain    Ischemia of lower extremity 01/23/2016   Hypoalbuminemia due to protein-calorie malnutrition (HCC)    Dysuria    Tobacco abuse    Constipation due to pain medication    Chronic low back pain    Benign essential HTN    Asthma    Hyponatremia    CKD (chronic kidney disease)    Anemia of chronic disease    Transaminitis    Phantom limb syndrome with pain (HCC)    S/P  AKA (above knee amputation) bilateral (HCC)    Acute kidney injury ([place])    Syncope    Pressure ulcer 01/12/2016   Fall    Encounter for central line placement    Urinary retention    AKI (acute kidney injury) ([place])    Traumatic rhabdomyolysis (HCC)    Rhabdomyolysis 01/03/2016   Nontraumatic compartment syndrome of leg 01/03/2016   Chest pain 10/27/2012   HTN (hypertension) 10/27/2012   Back pain 10/27/2012   Past Medical History:  Diagnosis Date   Back pain    Chest pain    Depression    DVT (deep venous thrombosis) (HCC)    GERD (gastroesophageal reflux disease)    History of kidney stones    passed   HTN (hypertension)    no longer has HTN    Family History  Problem Relation Age of Onset   Hypertension Father     Past Surgical History:  Procedure Laterality Date   AMPUTATION Bilateral 01/09/2016   Procedure: Bilateral Above Knee Amputation, Apply Wound VAC;  Surgeon: Newt Minion, MD;  Location: [place];  Service: Orthopedics;  Laterality: Bilateral;   FASCIOTOMY Bilateral 01/03/2016   Procedure: FASCIOTOMY;  Surgeon: Leandrew Koyanagi, MD;  Location: [place];  Service: Orthopedics;  Laterality: Bilateral;   I & D EXTREMITY Bilateral 01/05/2016   Procedure: IRRIGATION AND DEBRIDEMENT BILATERAL LOWER EXTREMITIES; WOUND VAC CHANGE;  Surgeon: Leandrew Koyanagi, MD;  Location: [place];  Service: Orthopedics;  Laterality: Bilateral;   I & D EXTREMITY Left 01/07/2016   Procedure: Irrigation and Debridement of Left Lower Extremity with Application of Wound Vac;  Surgeon: Leandrew Koyanagi, MD;  Location: [place];  Service: Orthopedics;  Laterality: Left;   STUMP REVISION Right 07/04/2022   Procedure: REVISION RIGHT ABOVE KNEE AMPUTATION;  Surgeon: Newt Minion, MD;  Location: [place];  Service: Orthopedics;  Laterality: Right;   VASECTOMY     Social History   Occupational History   Occupation: disabled  Tobacco Use   Smoking status: Every Day    Packs/day: 0.25    Years: 5.00    Total pack  years: 1.25    Types: Cigarettes   Smokeless tobacco: Current  Vaping Use   Vaping Use: Every day   Substances: Nicotine  Substance and Sexual Activity   Alcohol use: Yes    Comment: rare   Drug use: Not  Currently   Sexual activity: Not on file

## 2022-07-25 NOTE — H&P (View-Only) (Signed)
 Office Visit Note   Patient: Jeffery Gross           Date of Birth: 03/11/1961           MRN: [id number] Visit Date: 07/24/2022              Requested by: Sun, Yun, MD [address] [place],  [place] [address] PCP: Sun, Yun, MD  Chief Complaint  Patient presents with   Right Leg - Routine Post Op    07/04/22 right AKA revision      HPI: Patient is a 61-year-old gentleman who is 3 weeks status post right above-the-knee amputation revision.  Patient has been on oral antibiotics.  Assessment & Plan: Visit Diagnoses:  1. S/P AKA (above knee amputation) bilateral (HCC)     Plan: Continue current wound care and antibiotics follow-up in 1 week to remove the sutures.  Follow-Up Instructions: Return in about 1 week (around 07/31/2022).   Ortho Exam  Patient is alert, oriented, no adenopathy, well-dressed, normal affect, normal respiratory effort. Examination the wound has healed well since his last visit.  There is no draining no cellulitis no signs of infection.  Imaging: No results found. No images are attached to the encounter.  Labs: Lab Results  Component Value Date   HGBA1C 5.8 (H) 06/14/2022   ESRSEDRATE 43 (H) 06/14/2022   CRP 4.3 (H) 06/14/2022   REPTSTATUS 07/17/2022 FINAL 07/12/2022   REPTSTATUS 07/17/2022 FINAL 07/12/2022   CULT  07/12/2022    NO GROWTH 5 DAYS Performed at [facility], [address] [place], [place] [address]    CULT  07/12/2022    NO GROWTH 5 DAYS Performed at [facility], [address] [place], [place] [address]      Lab Results  Component Value Date   ALBUMIN 2.7 (L) 07/13/2022   ALBUMIN 3.5 07/12/2022   ALBUMIN 3.7 07/04/2022   PREALBUMIN 29 07/04/2022   PREALBUMIN 18 06/14/2022    Lab Results  Component Value Date   MG 2.0 07/13/2022   MG 2.3 01/07/2016   Lab Results  Component Value Date   VD25OH 63.57 07/04/2022    Lab Results  Component Value Date   PREALBUMIN 29 07/04/2022   PREALBUMIN 18 06/14/2022       Latest Ref Rng & Units 07/13/2022    5:15 AM 07/12/2022    7:30 PM 07/04/2022    8:26 AM  CBC EXTENDED  WBC 4.0 - 10.5 K/uL 6.0  6.0  12.4   RBC 4.22 - 5.81 MIL/uL 3.89  4.49  5.06   Hemoglobin 13.0 - 17.0 g/dL 10.8  12.6  14.5   HCT 39.0 - 52.0 % 33.2  36.5  42.5   Platelets 150 - 400 K/uL 205  260  229   NEUT# 1.7 - 7.7 K/uL 2.6  3.3  8.0   Lymph# 0.7 - 4.0 K/uL 2.2  1.9  3.0      There is no height or weight on file to calculate BMI.  Orders:  No orders of the defined types were placed in this encounter.  No orders of the defined types were placed in this encounter.    Procedures: No procedures performed  Clinical Data: No additional findings.  ROS:  All other systems negative, except as noted in the HPI. Review of Systems  Objective: Vital Signs: There were no vitals taken for this visit.  Specialty Comments:  No specialty comments available.  PMFS History: Patient Active Problem List     Diagnosis Date Noted   Tobacco use disorder 07/13/2022   Substance abuse ([place]) 07/13/2022   Cellulitis 07/12/2022   GERD (gastroesophageal reflux disease) 07/12/2022   AKA stump complication ([place]) 84/13/2440   Wound dehiscence 07/04/2022   Hx of AKA (above knee amputation), right ([place]) 07/04/2022   History of MRSA infection 06/14/2022   Cellulitis of right lower extremity 06/14/2022   Infected wound 06/13/2022   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   Seasonal allergies 02/08/2016   Anemia due to other cause 02/08/2016   Chronic pain syndrome 02/08/2016   Depression 02/08/2016   Post-operative pain    Ischemia of lower extremity 01/23/2016   Hypoalbuminemia due to protein-calorie malnutrition (HCC)    Dysuria    Tobacco abuse    Constipation due to pain medication    Chronic low back pain    Benign essential HTN    Asthma    Hyponatremia    CKD (chronic kidney disease)    Anemia of chronic disease    Transaminitis    Phantom limb syndrome with pain (HCC)    S/P  AKA (above knee amputation) bilateral (HCC)    Acute kidney injury ([place])    Syncope    Pressure ulcer 01/12/2016   Fall    Encounter for central line placement    Urinary retention    AKI (acute kidney injury) ([place])    Traumatic rhabdomyolysis (HCC)    Rhabdomyolysis 01/03/2016   Nontraumatic compartment syndrome of leg 01/03/2016   Chest pain 10/27/2012   HTN (hypertension) 10/27/2012   Back pain 10/27/2012   Past Medical History:  Diagnosis Date   Back pain    Chest pain    Depression    DVT (deep venous thrombosis) (HCC)    GERD (gastroesophageal reflux disease)    History of kidney stones    passed   HTN (hypertension)    no longer has HTN    Family History  Problem Relation Age of Onset   Hypertension Father     Past Surgical History:  Procedure Laterality Date   AMPUTATION Bilateral 01/09/2016   Procedure: Bilateral Above Knee Amputation, Apply Wound VAC;  Surgeon: Newt Minion, MD;  Location: [place];  Service: Orthopedics;  Laterality: Bilateral;   FASCIOTOMY Bilateral 01/03/2016   Procedure: FASCIOTOMY;  Surgeon: Leandrew Koyanagi, MD;  Location: [place];  Service: Orthopedics;  Laterality: Bilateral;   I & D EXTREMITY Bilateral 01/05/2016   Procedure: IRRIGATION AND DEBRIDEMENT BILATERAL LOWER EXTREMITIES; WOUND VAC CHANGE;  Surgeon: Leandrew Koyanagi, MD;  Location: [place];  Service: Orthopedics;  Laterality: Bilateral;   I & D EXTREMITY Left 01/07/2016   Procedure: Irrigation and Debridement of Left Lower Extremity with Application of Wound Vac;  Surgeon: Leandrew Koyanagi, MD;  Location: [place];  Service: Orthopedics;  Laterality: Left;   STUMP REVISION Right 07/04/2022   Procedure: REVISION RIGHT ABOVE KNEE AMPUTATION;  Surgeon: Newt Minion, MD;  Location: [place];  Service: Orthopedics;  Laterality: Right;   VASECTOMY     Social History   Occupational History   Occupation: disabled  Tobacco Use   Smoking status: Every Day    Packs/day: 0.25    Years: 5.00    Total pack  years: 1.25    Types: Cigarettes   Smokeless tobacco: Current  Vaping Use   Vaping Use: Every day   Substances: Nicotine  Substance and Sexual Activity   Alcohol use: Yes    Comment: rare   Drug use: Not  Currently   Sexual activity: Not on file

## 2022-07-31 ENCOUNTER — Ambulatory Visit (INDEPENDENT_AMBULATORY_CARE_PROVIDER_SITE_OTHER): Payer: Medicaid Other | Admitting: Orthopedic Surgery

## 2022-07-31 DIAGNOSIS — Z89612 Acquired absence of left leg above knee: Secondary | ICD-10-CM

## 2022-07-31 DIAGNOSIS — Z89611 Acquired absence of right leg above knee: Secondary | ICD-10-CM

## 2022-08-06 ENCOUNTER — Inpatient Hospital Stay (HOSPITAL_COMMUNITY)
Admission: EM | Admit: 2022-08-06 | Discharge: 2022-08-11 | DRG: 464 | Disposition: A | Discharge status: Home / Self Care | Payer: Medicaid Other | Attending: Student | Admitting: Student

## 2022-08-06 ENCOUNTER — Encounter (HOSPITAL_COMMUNITY): Payer: Self-pay | Admitting: Emergency Medicine

## 2022-08-06 ENCOUNTER — Other Ambulatory Visit: Payer: Self-pay

## 2022-08-06 DIAGNOSIS — T8740 Infection of amputation stump, unspecified extremity: Secondary | ICD-10-CM | POA: Diagnosis present

## 2022-08-06 DIAGNOSIS — Y835 Amputation of limb(s) as the cause of abnormal reaction of the patient, or of later complication, without mention of misadventure at the time of the procedure: Secondary | ICD-10-CM | POA: Diagnosis present

## 2022-08-06 DIAGNOSIS — F1120 Opioid dependence, uncomplicated: Secondary | ICD-10-CM | POA: Diagnosis present

## 2022-08-06 DIAGNOSIS — G546 Phantom limb syndrome with pain: Secondary | ICD-10-CM | POA: Diagnosis not present

## 2022-08-06 DIAGNOSIS — L02415 Cutaneous abscess of right lower limb: Secondary | ICD-10-CM | POA: Diagnosis present

## 2022-08-06 DIAGNOSIS — Z79899 Other long term (current) drug therapy: Secondary | ICD-10-CM | POA: Diagnosis not present

## 2022-08-06 DIAGNOSIS — I129 Hypertensive chronic kidney disease with stage 1 through stage 4 chronic kidney disease, or unspecified chronic kidney disease: Secondary | ICD-10-CM | POA: Diagnosis present

## 2022-08-06 DIAGNOSIS — Z86718 Personal history of other venous thrombosis and embolism: Secondary | ICD-10-CM | POA: Diagnosis not present

## 2022-08-06 DIAGNOSIS — Z716 Tobacco abuse counseling: Secondary | ICD-10-CM | POA: Diagnosis not present

## 2022-08-06 DIAGNOSIS — F1410 Cocaine abuse, uncomplicated: Secondary | ICD-10-CM | POA: Diagnosis present

## 2022-08-06 DIAGNOSIS — K219 Gastro-esophageal reflux disease without esophagitis: Secondary | ICD-10-CM | POA: Diagnosis present

## 2022-08-06 DIAGNOSIS — J45909 Unspecified asthma, uncomplicated: Secondary | ICD-10-CM | POA: Diagnosis not present

## 2022-08-06 DIAGNOSIS — Z8249 Family history of ischemic heart disease and other diseases of the circulatory system: Secondary | ICD-10-CM | POA: Diagnosis not present

## 2022-08-06 DIAGNOSIS — T8743 Infection of amputation stump, right lower extremity: Secondary | ICD-10-CM | POA: Diagnosis present

## 2022-08-06 DIAGNOSIS — Z89611 Acquired absence of right leg above knee: Secondary | ICD-10-CM | POA: Diagnosis not present

## 2022-08-06 DIAGNOSIS — Z8614 Personal history of Methicillin resistant Staphylococcus aureus infection: Secondary | ICD-10-CM

## 2022-08-06 DIAGNOSIS — F17200 Nicotine dependence, unspecified, uncomplicated: Secondary | ICD-10-CM | POA: Diagnosis not present

## 2022-08-06 DIAGNOSIS — I10 Essential (primary) hypertension: Secondary | ICD-10-CM | POA: Diagnosis not present

## 2022-08-06 DIAGNOSIS — G894 Chronic pain syndrome: Secondary | ICD-10-CM | POA: Diagnosis present

## 2022-08-06 DIAGNOSIS — F1910 Other psychoactive substance abuse, uncomplicated: Secondary | ICD-10-CM | POA: Diagnosis not present

## 2022-08-06 DIAGNOSIS — Z89612 Acquired absence of left leg above knee: Secondary | ICD-10-CM

## 2022-08-06 DIAGNOSIS — Z882 Allergy status to sulfonamides status: Secondary | ICD-10-CM

## 2022-08-06 DIAGNOSIS — E559 Vitamin D deficiency, unspecified: Secondary | ICD-10-CM | POA: Diagnosis present

## 2022-08-06 DIAGNOSIS — F17210 Nicotine dependence, cigarettes, uncomplicated: Secondary | ICD-10-CM | POA: Diagnosis present

## 2022-08-06 DIAGNOSIS — Z720 Tobacco use: Secondary | ICD-10-CM | POA: Diagnosis present

## 2022-08-06 DIAGNOSIS — M545 Low back pain, unspecified: Secondary | ICD-10-CM | POA: Diagnosis present

## 2022-08-06 DIAGNOSIS — T879 Unspecified complications of amputation stump: Secondary | ICD-10-CM

## 2022-08-06 DIAGNOSIS — F32A Depression, unspecified: Secondary | ICD-10-CM | POA: Diagnosis present

## 2022-08-06 DIAGNOSIS — F1490 Cocaine use, unspecified, uncomplicated: Secondary | ICD-10-CM | POA: Diagnosis not present

## 2022-08-06 DIAGNOSIS — F1411 Cocaine abuse, in remission: Secondary | ICD-10-CM | POA: Diagnosis not present

## 2022-08-06 LAB — CBC
HCT: 44.9 % (ref 39.0–52.0)
HCT: 41.4 % (ref 39.0–52.0)
Hemoglobin: 14.5 g/dL (ref 13.0–17.0)
Hemoglobin: 13.5 g/dL (ref 13.0–17.0)
MCH: 27.6 pg (ref 26.0–34.0)
MCH: 27.9 pg (ref 26.0–34.0)
MCHC: 32.3 g/dL (ref 30.0–36.0)
MCHC: 32.6 g/dL (ref 30.0–36.0)
MCV: 85.5 fL (ref 80.0–100.0)
MCV: 85.5 fL (ref 80.0–100.0)
Platelets: 248 10*3/uL (ref 150–400)
Platelets: 229 10*3/uL (ref 150–400)
RBC: 5.25 MIL/uL (ref 4.22–5.81)
RBC: 4.84 MIL/uL (ref 4.22–5.81)
RDW: 13.9 % (ref 11.5–15.5)
RDW: 14.1 % (ref 11.5–15.5)
WBC: 8.7 10*3/uL (ref 4.0–10.5)
WBC: 10.3 10*3/uL (ref 4.0–10.5)
nRBC: 0 % (ref 0.0–0.2)
nRBC: 0 % (ref 0.0–0.2)

## 2022-08-06 LAB — RAPID URINE DRUG SCREEN, HOSP PERFORMED
Amphetamines: NOT DETECTED
Barbiturates: NOT DETECTED
Benzodiazepines: NOT DETECTED
Cocaine: POSITIVE — AB
Opiates: POSITIVE — AB
Tetrahydrocannabinol: NOT DETECTED

## 2022-08-06 LAB — COMPREHENSIVE METABOLIC PANEL
ALT: 12 U/L (ref 0–44)
AST: 18 U/L (ref 15–41)
Albumin: 3.9 g/dL (ref 3.5–5.0)
Alkaline Phosphatase: 101 U/L (ref 38–126)
Anion gap: 9 (ref 5–15)
BUN: 22 mg/dL (ref 8–23)
CO2: 29 mmol/L (ref 22–32)
Calcium: 9.4 mg/dL (ref 8.9–10.3)
Chloride: 101 mmol/L (ref 98–111)
Creatinine, Ser: 1.1 mg/dL (ref 0.61–1.24)
GFR, Estimated: >60 mL/min (ref >60)
Glucose, Bld: 128 mg/dL — ABNORMAL HIGH (ref 70–99)
Potassium: 3.4 mmol/L — ABNORMAL LOW (ref 3.5–5.1)
Sodium: 139 mmol/L (ref 135–145)
Total Bilirubin: 0.3 mg/dL (ref 0.3–1.2)
Total Protein: 6.9 g/dL (ref 6.5–8.1)

## 2022-08-06 LAB — CREATININE, SERUM
Creatinine, Ser: 1.06 mg/dL (ref 0.61–1.24)
GFR, Estimated: >60 mL/min (ref >60)

## 2022-08-06 MED ORDER — HEPARIN SODIUM (PORCINE) 5000 UNIT/ML IJ SOLN
5000.0000 [IU] | Freq: Three times a day (TID) | INTRAMUSCULAR | Status: DC
  Administered 2022-08-06 – 2022-08-11 (×14): 5000 [IU] via SUBCUTANEOUS
  Filled 2022-08-06 (×14): qty 1

## 2022-08-06 MED ORDER — PANTOPRAZOLE SODIUM 40 MG PO TBEC
40.0000 mg | DELAYED_RELEASE_TABLET | Freq: Every day | ORAL | Status: DC
  Administered 2022-08-06 – 2022-08-11 (×6): 40 mg via ORAL
  Filled 2022-08-06 (×6): qty 1

## 2022-08-06 MED ORDER — ONDANSETRON HCL 4 MG/2ML IJ SOLN: 4.0000 mg | Freq: Four times a day (QID) | INTRAMUSCULAR | Status: DC | PRN

## 2022-08-06 MED ORDER — SENNA 8.6 MG PO TABS
1.0000 | ORAL_TABLET | Freq: Every day | ORAL | Status: DC
  Administered 2022-08-06 – 2022-08-10 (×5): 8.6 mg via ORAL
  Filled 2022-08-06 (×5): qty 1

## 2022-08-06 MED ORDER — VANCOMYCIN HCL 1500 MG/300ML IV SOLN
1500.0000 mg | Freq: Once | INTRAVENOUS | Status: AC
  Administered 2022-08-06: 1500 mg via INTRAVENOUS
  Filled 2022-08-06: qty 300

## 2022-08-06 MED ORDER — ACETAMINOPHEN 325 MG PO TABS
650.0000 mg | ORAL_TABLET | Freq: Four times a day (QID) | ORAL | Status: DC | PRN
  Administered 2022-08-08 – 2022-08-10 (×2): 650 mg via ORAL
  Filled 2022-08-06 (×2): qty 2

## 2022-08-06 MED ORDER — METRONIDAZOLE 500 MG PO TABS
500.0000 mg | ORAL_TABLET | Freq: Two times a day (BID) | ORAL | Status: DC
  Administered 2022-08-06 (×2): 500 mg via ORAL
  Filled 2022-08-06 (×3): qty 1

## 2022-08-06 MED ORDER — NALOXONE HCL 0.4 MG/ML IJ SOLN: 0.4000 mg | INTRAMUSCULAR | Status: DC | PRN

## 2022-08-06 MED ORDER — VANCOMYCIN HCL 500 MG/100ML IV SOLN
500.0000 mg | INTRAVENOUS | Status: DC
  Administered 2022-08-06: 500 mg via INTRAVENOUS
  Filled 2022-08-06 (×3): qty 100

## 2022-08-06 MED ORDER — POTASSIUM CHLORIDE CRYS ER 20 MEQ PO TBCR
40.0000 meq | EXTENDED_RELEASE_TABLET | Freq: Once | ORAL | Status: AC
  Administered 2022-08-06: 40 meq via ORAL
  Filled 2022-08-06: qty 2

## 2022-08-06 MED ORDER — HYDROMORPHONE HCL 1 MG/ML IJ SOLN
1.0000 mg | Freq: Once | INTRAMUSCULAR | Status: AC
  Administered 2022-08-06: 1 mg via INTRAVENOUS
  Filled 2022-08-06: qty 1

## 2022-08-06 MED ORDER — ACETAMINOPHEN 650 MG RE SUPP: 650.0000 mg | Freq: Four times a day (QID) | RECTAL | Status: DC | PRN

## 2022-08-06 MED ORDER — OXYCODONE HCL 5 MG PO TABS
20.0000 mg | ORAL_TABLET | Freq: Every day | ORAL | Status: DC
  Administered 2022-08-06 – 2022-08-11 (×24): 20 mg via ORAL
  Filled 2022-08-06 (×25): qty 4

## 2022-08-06 MED ORDER — SODIUM CHLORIDE 0.9 % IV SOLN
2.0000 g | Freq: Once | INTRAVENOUS | Status: AC
  Administered 2022-08-06: 2 g via INTRAVENOUS
  Filled 2022-08-06: qty 20

## 2022-08-06 MED ORDER — DULOXETINE HCL 60 MG PO CPEP
60.0000 mg | ORAL_CAPSULE | Freq: Every morning | ORAL | Status: DC
  Administered 2022-08-06 – 2022-08-11 (×5): 60 mg via ORAL
  Filled 2022-08-06 (×3): qty 1
  Filled 2022-08-06: qty 2
  Filled 2022-08-06: qty 1

## 2022-08-06 MED ORDER — TRAZODONE HCL 50 MG PO TABS
50.0000 mg | ORAL_TABLET | Freq: Every evening | ORAL | Status: DC | PRN
  Administered 2022-08-10: 50 mg via ORAL
  Filled 2022-08-06: qty 1

## 2022-08-06 MED ORDER — LACTATED RINGERS IV SOLN: INTRAVENOUS | Status: AC

## 2022-08-06 MED ORDER — NICOTINE 21 MG/24HR TD PT24
21.0000 mg | MEDICATED_PATCH | Freq: Every day | TRANSDERMAL | Status: DC
  Administered 2022-08-06 – 2022-08-11 (×6): 21 mg via TRANSDERMAL
  Filled 2022-08-06 (×6): qty 1

## 2022-08-06 MED ORDER — POLYETHYLENE GLYCOL 3350 17 G PO PACK
17.0000 g | PACK | Freq: Two times a day (BID) | ORAL | Status: DC
  Administered 2022-08-06 – 2022-08-11 (×7): 17 g via ORAL
  Filled 2022-08-06 (×10): qty 1

## 2022-08-06 MED ORDER — SODIUM CHLORIDE 0.9 % IV SOLN
2.0000 g | INTRAVENOUS | Status: DC
  Filled 2022-08-06: qty 20

## 2022-08-06 MED ORDER — ONDANSETRON HCL 4 MG PO TABS: 4.0000 mg | ORAL_TABLET | Freq: Four times a day (QID) | ORAL | Status: DC | PRN

## 2022-08-06 MED ORDER — GABAPENTIN 300 MG PO CAPS
300.0000 mg | ORAL_CAPSULE | Freq: Three times a day (TID) | ORAL | Status: DC
  Administered 2022-08-06 – 2022-08-11 (×15): 300 mg via ORAL
  Filled 2022-08-06 (×15): qty 1

## 2022-08-06 NOTE — Progress Notes (Signed)
Pharmacy Antibiotic Note  Jeffery Gross is a 63 y.o. male admitted on 08/06/2022 with cellulitis.  Pharmacy has been consulted for Vancomycin dosing. WBC WNL. Scr 1.1.  Note initial dosing may be more difficult due to patient being bilateral above th knee amputation, so pharmacokinetics may be slightly more difficult to estimate. Height changed from ~32ft to 65ft s/p amputations.   Plan: Vancomycin 500 mg IV q24h >>>Estimated AUC: 426 Trend WBC, temp, renal function  F/U infectious work-up Drug levels as indicated  Temp (24hrs), Avg:98 F (36.7 C), Min:98 F (36.7 C), Max:98 F (36.7 C)  Recent Labs  Lab 08/06/22 0254  WBC 8.7  CREATININE 1.10    CrCl cannot be calculated (Unknown ideal weight.).    Allergies  Allergen Reactions   Sulfacetamide Sodium Swelling    Edema     Narda Bonds, PharmD, BCPS Clinical Pharmacist Phone: [phone]

## 2022-08-06 NOTE — Hospital Course (Addendum)
62 year old gentleman current smoker with a complex past medical history including bilateral AKA, chronic pain with opioid dependence, h/o DVT, GERD, h/o MRSA, recreational drug user (cocaine), alcohol user, hypertension, chronic back pain who was recently discharged from this hospital 07/16/22 after being treated for a right stump infection after a revision to the stump on 07/04/22.  He was treated with IV antibiotics and subsequently discharged on oral doxycycline and cephalexin which he reportedly completed.  He followed up with his orthopedic surgeon Dr. Sharol Given and has been seen on the outpatient basis.  He reported that he has continued to have erythema and edema in the right stump and serosanguineous fluid drainage and malodorous purulent fluid drainage.  Symptoms seem to worsen after he completed the antibiotics.  Patient denies fever and chills.  He denies malaise.  He is having significant pain in the right stump and is reported edema and erythema with purulent drainage.    The case was discussed with Dr. Sammuel Hines with orthopedics in [place] who requested admission to [facility] for IV antibiotics and for patient to be seen by his orthopedist Dr. Sharol Given who has been managing this long-term.  Fortunately his WBC and procalcitonin has been reassuring and he was started on IV antibiotics in ED and admission requested for a bed at [facility].   1/18: Vital stable.  No leukocytosis.  ESR and CRP normal.  Prealbumin within normal limit.  UDS positive for opioids and cocaine.  Currently on ceftriaxone and vancomycin. Talk with Dr. Diana Eves will see him tomorrow morning and get a wound culture, holding antibiotics until that time.

## 2022-08-06 NOTE — ED Provider Notes (Signed)
[facility] Emergency Department Provider Note MRN:  [id number]  Arrival date & time: 08/06/22     Chief Complaint   Post-op Problem   History of Present Illness   Jeffery Gross is a 62 y.o. year-old male with a history of hypertension presenting to the ED with chief complaint of postop problem.  Patient explains that he was recently admitted to the hospital for an infection of his stump.  Had a recent revision of his above-the-knee amputation.  He was discharged from the hospital on 2 antibiotics.  He finished them a few days ago and then shortly after began having increased pain, redness to the right stump and now it is obviously draining purulent material.  Denies fever, no other complaints.  The pain and symptoms keep him from using his prosthetic.  Review of Systems  A thorough review of systems was obtained and all systems are negative except as noted in the HPI and PMH.   Patient's Health History    Past Medical History:  Diagnosis Date   Back pain    Chest pain    Depression    DVT (deep venous thrombosis) (HCC)    GERD (gastroesophageal reflux disease)    History of kidney stones    passed   HTN (hypertension)    no longer has HTN    Past Surgical History:  Procedure Laterality Date   AMPUTATION Bilateral 01/09/2016   Procedure: Bilateral Above Knee Amputation, Apply Wound VAC;  Surgeon: Newt Minion, MD;  Location: [place];  Service: Orthopedics;  Laterality: Bilateral;   FASCIOTOMY Bilateral 01/03/2016   Procedure: FASCIOTOMY;  Surgeon: Leandrew Koyanagi, MD;  Location: [place];  Service: Orthopedics;  Laterality: Bilateral;   I & D EXTREMITY Bilateral 01/05/2016   Procedure: IRRIGATION AND DEBRIDEMENT BILATERAL LOWER EXTREMITIES; WOUND VAC CHANGE;  Surgeon: Leandrew Koyanagi, MD;  Location: [place];  Service: Orthopedics;  Laterality: Bilateral;   I & D EXTREMITY Left 01/07/2016   Procedure: Irrigation and Debridement of Left Lower Extremity with  Application of Wound Vac;  Surgeon: Leandrew Koyanagi, MD;  Location: [place];  Service: Orthopedics;  Laterality: Left;   STUMP REVISION Right 07/04/2022   Procedure: REVISION RIGHT ABOVE KNEE AMPUTATION;  Surgeon: Newt Minion, MD;  Location: [place];  Service: Orthopedics;  Laterality: Right;   VASECTOMY      Family History  Problem Relation Age of Onset   Hypertension Father     Social History   Socioeconomic History   Marital status: Single    Spouse name: Not on file   Number of children: 3   Years of education: Not on file   Highest education level: Not on file  Occupational History   Occupation: disabled  Tobacco Use   Smoking status: Every Day    Packs/day: 0.25    Years: 5.00    Total pack years: 1.25    Types: Cigarettes   Smokeless tobacco: Current  Vaping Use   Vaping Use: Every day   Substances: Nicotine  Substance and Sexual Activity   Alcohol use: Yes    Comment: rare   Drug use: Not Currently   Sexual activity: Not on file  Other Topics Concern   Not on file  Social History Narrative   Not on file   Social Determinants of Health   Financial Resource Strain: Not on file  Food Insecurity: Food Insecurity Present (07/13/2022)   Hunger Vital Sign    Worried About  Running Out of Food in the Last Year: Sometimes true    Ran Out of Food in the Last Year: Sometimes true  Transportation Needs: No Transportation Needs (07/13/2022)   PRAPARE - Hydrologist (Medical): No    Lack of Transportation (Non-Medical): No  Physical Activity: Not on file  Stress: Not on file  Social Connections: Not on file  Intimate Partner Violence: Not At Risk (07/13/2022)   Humiliation, Afraid, Rape, and Kick questionnaire    Fear of Current or Ex-Partner: No    Emotionally Abused: No    Physically Abused: No    Sexually Abused: No     Physical Exam   Vitals:   08/06/22 0228  BP: 127/82  Pulse: 84  Resp: 17  Temp: 98 F (36.7 C)  SpO2: 96%     CONSTITUTIONAL: Well-appearing, NAD NEURO/PSYCH:  Alert and oriented x 3, no focal deficits EYES:  eyes equal and reactive ENT/NECK:  no LAD, no JVD CARDIO: Regular rate, well-perfused, normal S1 and S2 PULM:  CTAB no wheezing or rhonchi GI/GU:  non-distended, non-tender MSK/SPINE: Bilateral above-the-knee amputations SKIN: Erythema to the right distal stump with some purulent material draining   *Additional and/or pertinent findings included in MDM below  Diagnostic and Interventional Summary    EKG Interpretation  Date/Time:    Ventricular Rate:    PR Interval:    QRS Duration:   QT Interval:    QTC Calculation:   R Axis:     Text Interpretation:         Labs Reviewed  COMPREHENSIVE METABOLIC PANEL - Abnormal; Notable for the following components:      Result Value   Potassium 3.4 (*)    Glucose, Bld 128 (*)    All other components within normal limits  CBC    No orders to display    Medications  cefTRIAXone (ROCEPHIN) 2 g in sodium chloride 0.9 % 100 mL IVPB (2 g Intravenous New Bag/Given 08/06/22 0615)  vancomycin (VANCOREADY) IVPB 1500 mg/300 mL (has no administration in time range)  HYDROmorphone (DILAUDID) injection 1 mg (has no administration in time range)     Procedures  /  Critical Care Procedures  ED Course and Medical Decision Making  Initial Impression and Ddx Concern for stump cellulitis versus underlying abscess.  Suspicious for failed outpatient management given the timing of the completed oral antibiotics and the return of symptoms.  CT scan last month revealed fairly large fluid collection within the stump, question seroma versus hematoma versus infection.    Past medical/surgical history that increases complexity of ED encounter: History of bilateral above-the-knee amputations  Interpretation of Diagnostics I personally reviewed the laboratory assessment and my interpretation is as follows: No significant blood count or electrolyte  disturbance    Patient Reassessment and Ultimate Disposition/Management     Case discussed with Dr. Sammuel Hines who agrees with plan for admission for IV antibiotics, recommends admission at [facility] so that Dr. Farley Ly team can be consulted and provide any procedural intervention necessary.  Starting ceftriaxone, vancomycin, accepted for admission by hospitalist service.  Patient management required discussion with the following services or consulting groups:  Hospitalist Service and Orthopedic Surgery  Complexity of Problems Addressed Acute illness or injury that poses threat of life of bodily function  Additional Data Reviewed and Analyzed Further history obtained from: Prior labs/imaging results  Additional Factors Impacting ED Encounter Risk Consideration of hospitalization  Barth Kirks. [place] Small, [facility]  Medicine [facility] [email or web address]  Final Clinical Impressions(s) / ED Diagnoses     ICD-10-CM   1. Amputation stump infection (HCC)  T87.40       ED Discharge Orders     None        Discharge Instructions Discussed with and Provided to Patient:   Discharge Instructions   None      Sabas Sous, MD 08/06/22 [phone]

## 2022-08-06 NOTE — ED Triage Notes (Signed)
Pt here because he states he has pus draining from area where he recently had an AKA on Rt leg.

## 2022-08-06 NOTE — H&P (Signed)
History and Physical  [facility]  Jeffery Gross OEV:[id number] DOB: 1961-03-28 DOA: 08/06/2022  PCP: Sandi Mariscal, MD  Patient coming from: Home  Level of care: Med-Surg  I have personally briefly reviewed patient's old medical records in [place]  Chief Complaint: drainage from right stump  HPI: Jeffery Gross is a 62 year old gentleman current smoker with a complex past medical history including bilateral AKA, chronic pain with opioid dependence, h/o DVT, GERD, h/o MRSA, recreational drug user (cocaine), alcohol user, hypertension, chronic back pain who was recently discharged from this hospital 07/16/22 after being treated for a right stump infection after a revision to the stump on 07/04/22.  He was treated with IV antibiotics and subsequently discharged on oral doxycycline and cephalexin which he reportedly completed.  He followed up with his orthopedic surgeon Dr. Sharol Given and has been seen on the outpatient basis.  He reported that he has continued to have erythema and edema in the right stump and serosanguineous fluid drainage and malodorous purulent fluid drainage.  Symptoms seem to worsen after he completed the antibiotics.  Patient denies fever and chills.  He denies malaise.  He is having significant pain in the right stump and is reported edema and erythema with purulent drainage.    The case was discussed with Dr. Sammuel Hines with orthopedics in [place] who requested admission to [facility] for IV antibiotics and for patient to be seen by his orthopedist Dr. Sharol Given who has been managing this long-term.  Fortunately his WBC and procalcitonin has been reassuring and he was started on IV antibiotics in ED and admission requested for a bed at [facility].      Past Medical History:  Diagnosis Date   Back pain    Chest pain    Depression    DVT (deep venous thrombosis) (HCC)    GERD (gastroesophageal reflux disease)    History of kidney stones    passed   HTN (hypertension)    no longer has  HTN    Past Surgical History:  Procedure Laterality Date   AMPUTATION Bilateral 01/09/2016   Procedure: Bilateral Above Knee Amputation, Apply Wound VAC;  Surgeon: Newt Minion, MD;  Location: [place];  Service: Orthopedics;  Laterality: Bilateral;   FASCIOTOMY Bilateral 01/03/2016   Procedure: FASCIOTOMY;  Surgeon: Leandrew Koyanagi, MD;  Location: [place];  Service: Orthopedics;  Laterality: Bilateral;   I & D EXTREMITY Bilateral 01/05/2016   Procedure: IRRIGATION AND DEBRIDEMENT BILATERAL LOWER EXTREMITIES; WOUND VAC CHANGE;  Surgeon: Leandrew Koyanagi, MD;  Location: [place];  Service: Orthopedics;  Laterality: Bilateral;   I & D EXTREMITY Left 01/07/2016   Procedure: Irrigation and Debridement of Left Lower Extremity with Application of Wound Vac;  Surgeon: Leandrew Koyanagi, MD;  Location: [place];  Service: Orthopedics;  Laterality: Left;   STUMP REVISION Right 07/04/2022   Procedure: REVISION RIGHT ABOVE KNEE AMPUTATION;  Surgeon: Newt Minion, MD;  Location: [place];  Service: Orthopedics;  Laterality: Right;   VASECTOMY       reports that he has been smoking cigarettes. He has a 1.25 pack-year smoking history. He uses smokeless tobacco. He reports current alcohol use. He reports that he does not currently use drugs.  Allergies  Allergen Reactions   Sulfacetamide Sodium Swelling    Edema     Family History  Problem Relation Age of Onset   Hypertension Father     Prior to Admission medications   Medication Sig Start Date  End Date Taking? Authorizing Provider  DULoxetine (CYMBALTA) 60 MG capsule Take 60 mg by mouth in the morning.   Yes [provider]  gabapentin (NEURONTIN) 300 MG capsule Take 300 mg by mouth 3 (three) times daily. 05/16/22  Yes [provider]  omeprazole (PRILOSEC) 20 MG capsule Take 20 mg by mouth in the morning. 03/19/22  Yes [provider]  Oxycodone HCl 20 MG TABS Take 1 tablet (20 mg total) by mouth 5 (five) times daily. 07/01/22  Yes Nadara Mustard, MD  Vitamin D, Ergocalciferol, (DRISDOL) 1.25 MG (50000 UNIT) CAPS capsule Take 50,000 Units by mouth every Friday. 04/30/22  Yes [provider]    Physical Exam: Vitals:   08/06/22 0228 08/06/22 0800 08/06/22 1037 08/06/22 1041  BP: 127/82 115/85 105/76   Pulse: 84 71 63   Resp: 17  18   Temp: 98 F (36.7 C)   98.1 F (36.7 C)  TempSrc: Oral   Oral  SpO2: 96% 97% 96%     Constitutional: NAD, calm, comfortable Eyes: PERRL, lids and conjunctivae normal ENMT: Mucous membranes are moist. Posterior pharynx clear of any exudate or lesions.Normal dentition.  Neck: normal, supple, no masses, no thyromegaly Respiratory: clear to auscultation bilaterally, no wheezing, no crackles. Normal respiratory effort. No accessory muscle use.  Cardiovascular: normal s1, s2 sounds, no murmurs / rubs / gallops. No extremity edema. 2+ pedal pulses. No carotid bruits.  Abdomen: no tenderness, no masses palpated. No hepatosplenomegaly. Bowel sounds positive.  Musculoskeletal: right stump wound with serosanguineous drainage; erythema and mild edema of right stump noted. Left stump appears well healed with no signs of infection.        Skin: erythema, open areas of right stump seen in pics above Neurologic: CN 2-12 grossly intact. Sensation intact, DTR normal. Strength 5/5 in all 4.  Psychiatric: Normal judgment and insight. Alert and oriented x 3. Normal mood.   Labs on Admission: I have personally reviewed following labs and imaging studies  CBC: Recent Labs  Lab 08/06/22 0254  WBC 8.7  HGB 14.5  HCT 44.9  MCV 85.5  PLT 248   Basic Metabolic Panel: Recent Labs  Lab 08/06/22 0254  NA 139  K 3.4*  CL 101  CO2 29  GLUCOSE 128*  BUN 22  CREATININE 1.10  CALCIUM 9.4   GFR: CrCl cannot be calculated (Unknown ideal weight.). Liver Function Tests: Recent Labs  Lab 08/06/22 0254  AST 18  ALT 12  ALKPHOS 101  BILITOT 0.3  PROT 6.9  ALBUMIN 3.9   No results for  input(s): "LIPASE", "AMYLASE" in the last 168 hours. No results for input(s): "AMMONIA" in the last 168 hours. Coagulation Profile: No results for input(s): "INR", "PROTIME" in the last 168 hours. Cardiac Enzymes: No results for input(s): "CKTOTAL", "CKMB", "CKMBINDEX", "TROPONINI" in the last 168 hours. BNP (last 3 results) No results for input(s): "PROBNP" in the last 8760 hours. HbA1C: No results for input(s): "HGBA1C" in the last 72 hours. CBG: No results for input(s): "GLUCAP" in the last 168 hours. Lipid Profile: No results for input(s): "CHOL", "HDL", "LDLCALC", "TRIG", "CHOLHDL", "LDLDIRECT" in the last 72 hours. Thyroid Function Tests: No results for input(s): "TSH", "T4TOTAL", "FREET4", "T3FREE", "THYROIDAB" in the last 72 hours. Anemia Panel: No results for input(s): "VITAMINB12", "FOLATE", "FERRITIN", "TIBC", "IRON", "RETICCTPCT" in the last 72 hours. Urine analysis:    Component Value Date/Time   COLORURINE YELLOW 01/24/2016 0754   APPEARANCEUR Clear 04/24/2020 1327   LABSPEC  1.015 01/24/2016 0754   PHURINE 5.5 01/24/2016 0754   GLUCOSEU Negative 04/24/2020 1327   HGBUR NEGATIVE 01/24/2016 0754   BILIRUBINUR Negative 04/24/2020 [place] 01/24/2016 0754   PROTEINUR Negative 04/24/2020 [place] 01/24/2016 0754   NITRITE Negative 04/24/2020 1327   NITRITE NEGATIVE 01/24/2016 0754   LEUKOCYTESUR Negative 04/24/2020 1327    Radiological Exams on Admission: No results found.  EKG: Independently reviewed.   Assessment/Plan Principal Problem:   Amputation stump infection ([place]) Active Problems:   HTN (hypertension)   Back pain   Phantom limb syndrome with pain (HCC)   S/P AKA (above knee amputation) bilateral (HCC)   Asthma   Chronic pain syndrome   Infected wound   History of MRSA infection   AKA stump complication (HCC)   GERD (gastroesophageal reflux disease)   Tobacco use disorder   Substance abuse ([place])   H/O cocaine  abuse ([place])   Opioid dependence ([place])   Persistent/recalcitrant right stump infection -infection worsened after completion of oral antibiotics -restarted on IV antibiotics and extremity order-set bundle -he has a history of MRSA and 2 recent hospitalizations -history of recreational drug use -agree with need to see his orthopedist Dr. Sharol Given -he is admitted to [facility] for orthopedics consultation -reached out to orthopedist Dr. Sammuel Hines who recommended IV abx and admission to [facility] -check sed rate, CRP -further recommendations to follow  Tobacco usage -nicotine patch 21 mg daily ordered -counseled on cessation of all tobacco products  H/o recreational drug use -follow up urine toxicology screen   Chronic pain/opioid dependence -resumed home cymbalta, gabapentin and oxycodone  GERD  -protonix ordered for GI protection   DVT prophylaxis: [place] heparin   Code Status: full   Family Communication: patient updated with plan of care  Disposition Plan: admit to [facility]   Consults called: orthopedics Dr. Sammuel Hines with plan to consult Dr. Sharol Given at [facility]   Admission status: INP  Level of care: Med-Surg Irwin Brakeman MD [facility] How to contact the [facility] Attending or Consulting provider [place] or covering provider during after hours 7P -7A, for this patient?  Check the care team in [facility] and look for a) attending/consulting [facility] provider listed and b) the [facility] team listed Log into [email or web address] and use [place]'s universal password to access. If you do not have the password, please contact the hospital operator. Locate the [facility] provider you are looking for under [facility] and page to a number that you can be directly reached. If you still have difficulty reaching the provider, please page the [facility] (Director on Call) for the Hospitalists listed on amion for assistance.   If 7PM-7AM, please contact night-coverage [email or web address] Password [facility]  08/06/2022, 11:41 AM

## 2022-08-06 NOTE — ED Notes (Signed)
Gave snack  

## 2022-08-07 DIAGNOSIS — F1120 Opioid dependence, uncomplicated: Secondary | ICD-10-CM | POA: Diagnosis not present

## 2022-08-07 DIAGNOSIS — K219 Gastro-esophageal reflux disease without esophagitis: Secondary | ICD-10-CM

## 2022-08-07 DIAGNOSIS — T8740 Infection of amputation stump, unspecified extremity: Secondary | ICD-10-CM | POA: Diagnosis not present

## 2022-08-07 DIAGNOSIS — G894 Chronic pain syndrome: Secondary | ICD-10-CM

## 2022-08-07 LAB — COMPREHENSIVE METABOLIC PANEL
ALT: 10 U/L (ref 0–44)
AST: 13 U/L — ABNORMAL LOW (ref 15–41)
Albumin: 3.1 g/dL — ABNORMAL LOW (ref 3.5–5.0)
Alkaline Phosphatase: 72 U/L (ref 38–126)
Anion gap: 7 (ref 5–15)
BUN: 18 mg/dL (ref 8–23)
CO2: 27 mmol/L (ref 22–32)
Calcium: 8.6 mg/dL — ABNORMAL LOW (ref 8.9–10.3)
Chloride: 106 mmol/L (ref 98–111)
Creatinine, Ser: 1.1 mg/dL (ref 0.61–1.24)
GFR, Estimated: >60 mL/min (ref >60)
Glucose, Bld: 110 mg/dL — ABNORMAL HIGH (ref 70–99)
Potassium: 4.3 mmol/L (ref 3.5–5.1)
Sodium: 140 mmol/L (ref 135–145)
Total Bilirubin: 0.1 mg/dL — ABNORMAL LOW (ref 0.3–1.2)
Total Protein: 5.6 g/dL — ABNORMAL LOW (ref 6.5–8.1)

## 2022-08-07 LAB — PREALBUMIN: Prealbumin: 22 mg/dL (ref 18–38)

## 2022-08-07 LAB — SEDIMENTATION RATE: Sed Rate: 6 mm/hr (ref 0–16)

## 2022-08-07 LAB — MAGNESIUM: Magnesium: 2.1 mg/dL (ref 1.7–2.4)

## 2022-08-07 LAB — C-REACTIVE PROTEIN: CRP: 0.9 mg/dL (ref <1.0)

## 2022-08-07 MED ORDER — ENSURE MAX PROTEIN PO LIQD
11.0000 [oz_av] | Freq: Two times a day (BID) | ORAL | Status: DC
  Administered 2022-08-07 – 2022-08-10 (×7): 11 [oz_av] via ORAL
  Filled 2022-08-07 (×9): qty 330

## 2022-08-07 MED ORDER — VITAMIN C 500 MG PO TABS
500.0000 mg | ORAL_TABLET | Freq: Every day | ORAL | Status: DC
  Administered 2022-08-07 – 2022-08-08 (×2): 500 mg via ORAL
  Filled 2022-08-07 (×2): qty 1

## 2022-08-07 MED ORDER — ADULT MULTIVITAMIN W/MINERALS CH
1.0000 | ORAL_TABLET | Freq: Every day | ORAL | Status: DC
  Administered 2022-08-07 – 2022-08-11 (×4): 1 via ORAL
  Filled 2022-08-07 (×4): qty 1

## 2022-08-07 MED ORDER — ZINC SULFATE 220 (50 ZN) MG PO CAPS
220.0000 mg | ORAL_CAPSULE | Freq: Every day | ORAL | Status: DC
  Administered 2022-08-07 – 2022-08-11 (×5): 220 mg via ORAL
  Filled 2022-08-07 (×5): qty 1

## 2022-08-07 MED ORDER — JUVEN PO PACK
1.0000 | PACK | Freq: Two times a day (BID) | ORAL | Status: DC
  Administered 2022-08-07 – 2022-08-11 (×7): 1 via ORAL
  Filled 2022-08-07 (×7): qty 1

## 2022-08-07 NOTE — Assessment & Plan Note (Signed)
-  Continue home Cymbalta, gabapentin and oxycodone

## 2022-08-07 NOTE — Assessment & Plan Note (Signed)
-  Continue Protonix °

## 2022-08-07 NOTE — Assessment & Plan Note (Signed)
S/p bilateral AKA, right AKA was done in December 2023, followed by recurrent infection.  Patient concerning for increased discharge and erythema after stopped most recent course of antibiotic.  Also has an history of MRSA. He was placed on broad-spectrum antibiotics with ceftriaxone, Flagyl and vancomycin. Vital stable, afebrile with no leukocytosis.  ESR and CRP normal. Message sent to Dr. Sharol Given if we can obtained a wound culture. -Hold off to antibiotics and will resume once wound cultures are obtained as patient is stable and negative inflammatory markers.

## 2022-08-07 NOTE — Assessment & Plan Note (Signed)
-  Nicotine patch

## 2022-08-07 NOTE — Assessment & Plan Note (Signed)
Patient with history of chronic pain syndrome. -Continue home oxycodone

## 2022-08-07 NOTE — Assessment & Plan Note (Signed)
UDS again positive for opioids and cocaine.  Patient is on oxycodone at home. -Counseling was provided

## 2022-08-07 NOTE — Progress Notes (Signed)
Progress Note   Patient: Jeffery Gross HYQ:[id number] DOB: 08-26-1960 DOA: 08/06/2022     1 DOS: the patient was seen and examined on 08/07/2022   Brief hospital course: 62 year old gentleman current smoker with a complex past medical history including bilateral AKA, chronic pain with opioid dependence, h/o DVT, GERD, h/o MRSA, recreational drug user (cocaine), alcohol user, hypertension, chronic back pain who was recently discharged from this hospital 07/16/22 after being treated for a right stump infection after a revision to the stump on 07/04/22.  He was treated with IV antibiotics and subsequently discharged on oral doxycycline and cephalexin which he reportedly completed.  He followed up with his orthopedic surgeon Dr. Sharol Given and has been seen on the outpatient basis.  He reported that he has continued to have erythema and edema in the right stump and serosanguineous fluid drainage and malodorous purulent fluid drainage.  Symptoms seem to worsen after he completed the antibiotics.  Patient denies fever and chills.  He denies malaise.  He is having significant pain in the right stump and is reported edema and erythema with purulent drainage.    The case was discussed with Dr. Sammuel Hines with orthopedics in [place] who requested admission to [facility] for IV antibiotics and for patient to be seen by his orthopedist Dr. Sharol Given who has been managing this long-term.  Fortunately his WBC and procalcitonin has been reassuring and he was started on IV antibiotics in ED and admission requested for a bed at [facility].   1/18: Vital stable.  No leukocytosis.  ESR and CRP normal.  Prealbumin within normal limit.  UDS positive for opioids and cocaine.  Currently on ceftriaxone and vancomycin. Talk with Dr. Diana Eves will see him tomorrow morning and get a wound culture, holding antibiotics until that time.  Assessment and Plan: * Amputation stump infection ([place]) S/p bilateral AKA, right AKA was done in December 2023, followed  by recurrent infection.  Patient concerning for increased discharge and erythema after stopped most recent course of antibiotic.  Also has an history of MRSA. He was placed on broad-spectrum antibiotics with ceftriaxone, Flagyl and vancomycin. Vital stable, afebrile with no leukocytosis.  ESR and CRP normal. Message sent to Dr. Sharol Given if we can obtained a wound culture. -Hold off to antibiotics and will resume once wound cultures are obtained as patient is stable and negative inflammatory markers.  Chronic pain syndrome -Continue home Cymbalta, gabapentin and oxycodone  Opioid dependence ([place]) Patient with history of chronic pain syndrome. -Continue home oxycodone  GERD (gastroesophageal reflux disease) -Continue Protonix  H/O cocaine abuse ([place]) UDS again positive for opioids and cocaine.  Patient is on oxycodone at home. -Counseling was provided  Tobacco use disorder -Nicotine patch        Subjective: Patient was seen and examined today.  Denies any significant pain.  He was concerned that there was some serous discharge from his small place at his stump.  No fever or chills.  Physical Exam: Vitals:   08/06/22 1430 08/06/22 1705 08/07/22 0322 08/07/22 0830  BP: 118/85 132/77 100/67 (!) 105/91  Pulse: 66 68 60 74  Resp: 18 18 16 17   Temp: 98.1 F (36.7 C) 98 F (36.7 C) 97.8 F (36.6 C) 98.5 F (36.9 C)  TempSrc: Oral Oral Oral Oral  SpO2: 97% 96% 95% 98%   General.     In no acute distress. Pulmonary.  Lungs clear bilaterally, normal respiratory effort. CV.  Regular rate and rhythm, no JVD, rub or murmur. Abdomen.  Soft, nontender, nondistended, BS positive. CNS.  Alert and oriented .  No focal neurologic deficit. Extremities.  Bilateral AKA, right stump with a small hole with some serosanguineous discharge and surrounding erythema. Psychiatry.  Judgment and insight appears normal.     Data Reviewed: Prior data reviewed  Family Communication: Discussed with  patient  Disposition: Status is: Inpatient Remains inpatient appropriate because: Severity of illness  Planned Discharge Destination: Home  DVT prophylaxis.  Lovenox Time spent: 45 minutes  This record has been created using Systems analyst. Errors have been sought and corrected,but may not always be located. Such creation errors do not reflect on the standard of care.   Author: Lorella Nimrod, MD 08/07/2022 1:19 PM  For on call review [email or web address].

## 2022-08-07 NOTE — Progress Notes (Signed)
Initial Nutrition Assessment  DOCUMENTATION CODES:   Not applicable  INTERVENTION:  - Add Ensure Max po BID, each supplement provides 150 kcal and 30 grams of protein.   - Add -1 packet Juven BID, each packet provides 95 calories, 2.5 grams of protein (collagen), and 9.8 grams of carbohydrate (3 grams sugar); also contains 7 grams of L-arginine and L-glutamine, 300 mg vitamin C, 15 mg vitamin E, 1.2 mcg vitamin B-12, 9.5 mg zinc, 200 mg calcium, and 1.5 g  Calcium Beta-hydroxy-Beta-methylbutyrate to support wound healing  - Add Vitamin C   - Add Zinc x14 days   - Add MVI q day.   NUTRITION DIAGNOSIS:   Increased nutrient needs related to wound healing as evidenced by estimated needs.  GOAL:   Patient will meet greater than or equal to 90% of their needs  MONITOR:   PO intake, Supplement acceptance  REASON FOR ASSESSMENT:   Consult Wound healing  ASSESSMENT:   62 y.o. male admits related to drainage from right stump. PMH includes: bilateral AKA, chronic opiod dependence, GERD, MRSA, recreational drug use, HTN. Pt is currently receiving medical management for right stump infection.  Meds reviewed: flagyl, miralax, senokot. Labs reviewed.   The pt reports that he has been eating well since admission and has a great appetite. The pt reports that he was eating well PTA as well. No significant wt loss per record. RD will add Ensure BID for now as well as Juven BID to support increased protein intake and wound healing. RD will continue to monitor PO intakes.   NUTRITION - FOCUSED PHYSICAL EXAM:  WNL - no wasting noted.   Diet Order:   Diet Order             Diet regular Room service appropriate? Yes; Fluid consistency: Thin  Diet effective now                   EDUCATION NEEDS:   Not appropriate for education at this time  Skin:  Skin Assessment: Reviewed RN Assessment  Last BM:  unknown  Height:   Ht Readings from Last 1 Encounters:  07/12/22 4' (1.219  m)    Weight:   Wt Readings from Last 1 Encounters:  07/12/22 70.3 kg    Ideal Body Weight:     BMI:  There is no height or weight on file to calculate BMI.  Estimated Nutritional Needs:   Kcal:  0211-1552 kcals  Protein:  105-125 gm  Fluid:  >/= 2.1 L  Thalia Bloodgood, RD, LDN, CNSC.

## 2022-08-08 DIAGNOSIS — F1490 Cocaine use, unspecified, uncomplicated: Secondary | ICD-10-CM

## 2022-08-08 DIAGNOSIS — F1120 Opioid dependence, uncomplicated: Secondary | ICD-10-CM | POA: Diagnosis not present

## 2022-08-08 DIAGNOSIS — F17200 Nicotine dependence, unspecified, uncomplicated: Secondary | ICD-10-CM | POA: Diagnosis not present

## 2022-08-08 DIAGNOSIS — T8740 Infection of amputation stump, unspecified extremity: Secondary | ICD-10-CM | POA: Diagnosis not present

## 2022-08-08 NOTE — Plan of Care (Signed)

## 2022-08-08 NOTE — Progress Notes (Signed)
PROGRESS NOTE  Jeffery Gross XIP:[id number] DOB: [birth year]   PCP: Sandi Mariscal, MD  Patient is from: Home  DOA: 08/06/2022 LOS: 2  Chief complaints Chief Complaint  Patient presents with   Post-op Problem     Brief Narrative / Interim history: 62 year old M with PMH of bilateral AKA, polysubstance use including cocaine, tobacco and alcohol, chronic back pain on opiate and GERD presented to the pain hospital with pain, erythema and purulent drainage from right AKA stump after he completed oral antibiotic course outpatient.  He is followed by Dr. Sharol Given.  He had no leukocytosis.  ESR and CRP within normal.  Started on ceftriaxone and vancomycin and transferred to [facility] for evaluation by Dr. Sharol Given per recommendation by Dr. Sammuel Hines.  UDS positive for cocaine.  Antibiotics held on 1/18 until evaluation by orthopedic surgery to obtain wound culture.   Subjective: Seen and examined earlier this morning.  No major events overnight of this morning.  No complaints.  Reports "some" pain and right AKA stump.  Noted some purulent drainage as well.  No fever or chills.  No other complaints.  When told about cocaine in his urine, he says "I guess someone put it in my drink to perk me up".   Objective: Vitals:   08/07/22 1552 08/07/22 2049 08/08/22 0341 08/08/22 0802  BP: 114/82 108/73 113/72 114/64  Pulse: 67 78 75 70  Resp: 16 16 16 17   Temp: 98.3 F (36.8 C) 98.1 F (36.7 C) 97.7 F (36.5 C) 98 F (36.7 C)  TempSrc: Oral Oral Oral Oral  SpO2: 100% 96% 97% 99%    Examination:  GENERAL: No apparent distress.  Nontoxic. HEENT: MMM.  Vision and hearing grossly intact.  NECK: Supple.  No apparent JVD.  RESP:  No IWOB.  Fair aeration bilaterally. CVS:  RRR. Heart sounds normal.  ABD/GI/GU: BS+. Abd soft, NTND.  MSK/EXT: Bilateral AKA.  Slight purulent drainage from right AKA site.  See picture in the media. SKIN: As above. NEURO: Awake, alert and oriented appropriately.  No apparent  focal neuro deficit. PSYCH: Calm. Normal affect.   Procedures:  None  Microbiology summarized: None  Assessment and plan: Principal Problem:   Amputation stump infection ([place]) Active Problems:   Chronic pain syndrome   Opioid dependence (HCC)   GERD (gastroesophageal reflux disease)   Cocaine use   Tobacco use disorder  Persistent/recalcitrant right stump infection: Presents with pain, redness and purulent drainage from right stump.  Some purulent drainage on exam.  No leukocytosis.  CRP and ESR within normal.  No fever or chills.  Has history of MRSA and 2 recent hospitalizations. -Plan for revision of right AKA by Dr. Sharol Given tomorrow -Continue holding antibiotics until revision of AKA. -Need to quit smoking.   Polysubstance use including cocaine, alcohol and tobacco:  -Encouraged to refrain from cocaine and alcohol use. -Encouraged smoking cessation -Nicotine patch 21 mg daily ordered   Chronic pain/opioid dependence -Continue home cymbalta, gabapentin and oxycodone  Bilateral AKA: Uses wheelchair at baseline.  Able to transfer.   GERD  -protonix ordered for GI protection   There is no height or weight on file to calculate BMI.  Increased nutrient needs: Nutrition Problem: Increased nutrient needs Etiology: wound healing Signs/Symptoms: estimated needs Interventions: Ensure Enlive (each supplement provides 350kcal and 20 grams of protein), MVI, Juven   DVT prophylaxis:  heparin injection 5,000 Units Start: 08/06/22 1400  Code Status: Full code Family Communication: None at bedside Level of care: Med-Surg  Status is: Inpatient Remains inpatient appropriate because: Due to right AKA stump infection pending surgical revision.    Final disposition: TBD Consultants:  Orthopedic surgery  35 minutes with more than 50% spent in reviewing records, counseling patient/family and coordinating care.   Sch Meds:  Scheduled Meds:  ascorbic acid  500 mg Oral Daily    DULoxetine  60 mg Oral q AM   gabapentin  300 mg Oral TID   heparin  5,000 Units Subcutaneous Q8H   multivitamin with minerals  1 tablet Oral Daily   nicotine  21 mg Transdermal Daily   nutrition supplement (JUVEN)  1 packet Oral BID BM   oxyCODONE  20 mg Oral 5 X Daily   pantoprazole  40 mg Oral Daily   polyethylene glycol  17 g Oral BID   Ensure Max Protein  11 oz Oral BID   senna  1 tablet Oral QHS   zinc sulfate  220 mg Oral Daily   Continuous Infusions: PRN Meds:.acetaminophen **OR** acetaminophen, naLOXone (NARCAN)  injection, ondansetron **OR** ondansetron (ZOFRAN) IV, traZODone  Antimicrobials: Anti-infectives (From admission, onward)    Start     Dose/Rate Route Frequency Ordered Stop   08/07/22 0800  cefTRIAXone (ROCEPHIN) 2 g in sodium chloride 0.9 % 100 mL IVPB  Status:  Discontinued        2 g 200 mL/hr over 30 Minutes Intravenous Every 24 hours 08/06/22 1131 08/07/22 1313   08/06/22 2200  vancomycin (VANCOREADY) IVPB 500 mg/100 mL  Status:  Discontinued        500 mg 100 mL/hr over 60 Minutes Intravenous Every 24 hours 08/06/22 0646 08/07/22 1313   08/06/22 1400  metroNIDAZOLE (FLAGYL) tablet 500 mg  Status:  Discontinued        500 mg Oral Every 12 hours 08/06/22 1131 08/07/22 1313   08/06/22 0530  cefTRIAXone (ROCEPHIN) 2 g in sodium chloride 0.9 % 100 mL IVPB        2 g 200 mL/hr over 30 Minutes Intravenous  Once 08/06/22 0526 08/06/22 0917   08/06/22 0530  vancomycin (VANCOREADY) IVPB 1500 mg/300 mL        1,500 mg 150 mL/hr over 120 Minutes Intravenous  Once 08/06/22 0529 08/06/22 1019        I have personally reviewed the following labs and images: CBC: Recent Labs  Lab 08/06/22 0254 08/06/22 1156  WBC 8.7 10.3  HGB 14.5 13.5  HCT 44.9 41.4  MCV 85.5 85.5  PLT 248 229   BMP &GFR Recent Labs  Lab 08/06/22 0254 08/06/22 1156 08/07/22 0333  NA 139  --  140  K 3.4*  --  4.3  CL 101  --  106  CO2 29  --  27  GLUCOSE 128*  --  110*  BUN 22   --  18  CREATININE 1.10 1.06 1.10  CALCIUM 9.4  --  8.6*  MG  --   --  2.1   CrCl cannot be calculated (Unknown ideal weight.). Liver & Pancreas: Recent Labs  Lab 08/06/22 0254 08/07/22 0333  AST 18 13*  ALT 12 10  ALKPHOS 101 72  BILITOT 0.3 0.1*  PROT 6.9 5.6*  ALBUMIN 3.9 3.1*   No results for input(s): "LIPASE", "AMYLASE" in the last 168 hours. No results for input(s): "AMMONIA" in the last 168 hours. Diabetic: No results for input(s): "HGBA1C" in the last 72 hours. No results for input(s): "GLUCAP" in the last 168 hours. Cardiac Enzymes: No results for  input(s): "CKTOTAL", "CKMB", "CKMBINDEX", "TROPONINI" in the last 168 hours. No results for input(s): "PROBNP" in the last 8760 hours. Coagulation Profile: No results for input(s): "INR", "PROTIME" in the last 168 hours. Thyroid Function Tests: No results for input(s): "TSH", "T4TOTAL", "FREET4", "T3FREE", "THYROIDAB" in the last 72 hours. Lipid Profile: No results for input(s): "CHOL", "HDL", "LDLCALC", "TRIG", "CHOLHDL", "LDLDIRECT" in the last 72 hours. Anemia Panel: No results for input(s): "VITAMINB12", "FOLATE", "FERRITIN", "TIBC", "IRON", "RETICCTPCT" in the last 72 hours. Urine analysis:    Component Value Date/Time   COLORURINE YELLOW 01/24/2016 0754   APPEARANCEUR Clear 04/24/2020 1327   LABSPEC 1.015 01/24/2016 0754   PHURINE 5.5 01/24/2016 0754   GLUCOSEU Negative 04/24/2020 1327   HGBUR NEGATIVE 01/24/2016 0754   BILIRUBINUR Negative 04/24/2020 [place] 01/24/2016 0754   PROTEINUR Negative 04/24/2020 [place] 01/24/2016 0754   NITRITE Negative 04/24/2020 1327   NITRITE NEGATIVE 01/24/2016 0754   LEUKOCYTESUR Negative 04/24/2020 1327   Sepsis Labs: Invalid input(s): "PROCALCITONIN", "LACTICIDVEN"  Microbiology: No results found for this or any previous visit (from the past 240 hour(s)).  Radiology Studies: No results found.    Airyonna Franklyn T. [place]  If 7PM-7AM, please contact night-coverage [email or web address] 08/08/2022, 11:27 AM

## 2022-08-09 ENCOUNTER — Encounter (HOSPITAL_COMMUNITY): Payer: Self-pay | Admitting: Internal Medicine

## 2022-08-09 ENCOUNTER — Other Ambulatory Visit: Payer: Self-pay

## 2022-08-09 ENCOUNTER — Inpatient Hospital Stay (HOSPITAL_COMMUNITY): Payer: Medicaid Other | Admitting: Anesthesiology

## 2022-08-09 ENCOUNTER — Encounter (HOSPITAL_COMMUNITY)
Admission: EM | Disposition: A | Discharge status: Home / Self Care | Payer: Self-pay | Source: Home / Self Care | Attending: Student

## 2022-08-09 DIAGNOSIS — T8740 Infection of amputation stump, unspecified extremity: Secondary | ICD-10-CM | POA: Diagnosis not present

## 2022-08-09 DIAGNOSIS — T8743 Infection of amputation stump, right lower extremity: Secondary | ICD-10-CM

## 2022-08-09 DIAGNOSIS — F1490 Cocaine use, unspecified, uncomplicated: Secondary | ICD-10-CM | POA: Diagnosis not present

## 2022-08-09 DIAGNOSIS — F17200 Nicotine dependence, unspecified, uncomplicated: Secondary | ICD-10-CM | POA: Diagnosis not present

## 2022-08-09 DIAGNOSIS — J45909 Unspecified asthma, uncomplicated: Secondary | ICD-10-CM

## 2022-08-09 DIAGNOSIS — I10 Essential (primary) hypertension: Secondary | ICD-10-CM

## 2022-08-09 DIAGNOSIS — F17210 Nicotine dependence, cigarettes, uncomplicated: Secondary | ICD-10-CM

## 2022-08-09 DIAGNOSIS — L02415 Cutaneous abscess of right lower limb: Secondary | ICD-10-CM

## 2022-08-09 DIAGNOSIS — F1120 Opioid dependence, uncomplicated: Secondary | ICD-10-CM | POA: Diagnosis not present

## 2022-08-09 HISTORY — PX: AMPUTATION: SHX166

## 2022-08-09 SURGERY — AMPUTATION, ABOVE KNEE
Anesthesia: General | Site: Knee | Laterality: Right

## 2022-08-09 MED ORDER — METOPROLOL TARTRATE 5 MG/5ML IV SOLN: 2.0000 mg | INTRAVENOUS | Status: DC | PRN

## 2022-08-09 MED ORDER — LACTATED RINGERS IV SOLN: INTRAVENOUS | Status: DC

## 2022-08-09 MED ORDER — CHLORHEXIDINE GLUCONATE 0.12 % MT SOLN: 15.0000 mL | Freq: Once | OROMUCOSAL | Status: AC

## 2022-08-09 MED ORDER — LABETALOL HCL 5 MG/ML IV SOLN: 10.0000 mg | INTRAVENOUS | Status: DC | PRN

## 2022-08-09 MED ORDER — FENTANYL CITRATE (PF) 250 MCG/5ML IJ SOLN
INTRAMUSCULAR | Status: DC | PRN
  Administered 2022-08-09 (×3): 50 ug via INTRAVENOUS

## 2022-08-09 MED ORDER — VANCOMYCIN HCL 750 MG/150ML IV SOLN
750.0000 mg | Freq: Two times a day (BID) | INTRAVENOUS | Status: DC
  Administered 2022-08-10: 750 mg via INTRAVENOUS
  Filled 2022-08-09 (×2): qty 150

## 2022-08-09 MED ORDER — POLYETHYLENE GLYCOL 3350 17 G PO PACK: 17.0000 g | PACK | Freq: Every day | ORAL | Status: DC | PRN

## 2022-08-09 MED ORDER — ORAL CARE MOUTH RINSE: 15.0000 mL | Freq: Once | OROMUCOSAL | Status: AC

## 2022-08-09 MED ORDER — METRONIDAZOLE 500 MG/100ML IV SOLN
500.0000 mg | Freq: Two times a day (BID) | INTRAVENOUS | Status: DC
  Administered 2022-08-09 – 2022-08-10 (×3): 500 mg via INTRAVENOUS
  Filled 2022-08-09 (×3): qty 100

## 2022-08-09 MED ORDER — JUVEN PO PACK: 1.0000 | PACK | Freq: Two times a day (BID) | ORAL | Status: DC

## 2022-08-09 MED ORDER — PROPOFOL 10 MG/ML IV BOLUS
INTRAVENOUS | Status: DC | PRN
  Administered 2022-08-09: 200 mg via INTRAVENOUS

## 2022-08-09 MED ORDER — CHLORHEXIDINE GLUCONATE 0.12 % MT SOLN
OROMUCOSAL | Status: AC
  Administered 2022-08-09: 15 mL via OROMUCOSAL
  Filled 2022-08-09: qty 15

## 2022-08-09 MED ORDER — DEXAMETHASONE SODIUM PHOSPHATE 10 MG/ML IJ SOLN
INTRAMUSCULAR | Status: AC
  Filled 2022-08-09: qty 1

## 2022-08-09 MED ORDER — CEFAZOLIN SODIUM-DEXTROSE 2-4 GM/100ML-% IV SOLN: 2.0000 g | Freq: Three times a day (TID) | INTRAVENOUS | Status: DC

## 2022-08-09 MED ORDER — MAGNESIUM SULFATE 2 GM/50ML IV SOLN: 2.0000 g | Freq: Every day | INTRAVENOUS | Status: DC | PRN

## 2022-08-09 MED ORDER — SODIUM CHLORIDE 0.9 % IV SOLN
2.0000 g | Freq: Two times a day (BID) | INTRAVENOUS | Status: DC
  Administered 2022-08-09 – 2022-08-10 (×2): 2 g via INTRAVENOUS
  Filled 2022-08-09 (×3): qty 12.5

## 2022-08-09 MED ORDER — HYDRALAZINE HCL 20 MG/ML IJ SOLN: 5.0000 mg | INTRAMUSCULAR | Status: DC | PRN

## 2022-08-09 MED ORDER — ACETAMINOPHEN 10 MG/ML IV SOLN
INTRAVENOUS | Status: AC
  Filled 2022-08-09: qty 100

## 2022-08-09 MED ORDER — KETOROLAC TROMETHAMINE 30 MG/ML IJ SOLN
INTRAMUSCULAR | Status: DC | PRN
  Administered 2022-08-09: 30 mg via INTRAVENOUS

## 2022-08-09 MED ORDER — SODIUM CHLORIDE 0.9 % IV SOLN: INTRAVENOUS | Status: DC

## 2022-08-09 MED ORDER — SODIUM CHLORIDE (PF) 0.9 % IJ SOLN
INTRAMUSCULAR | Status: AC
  Filled 2022-08-09: qty 10

## 2022-08-09 MED ORDER — VITAMIN C 500 MG PO TABS
1000.0000 mg | ORAL_TABLET | Freq: Every day | ORAL | Status: DC
  Administered 2022-08-09 – 2022-08-11 (×3): 1000 mg via ORAL
  Filled 2022-08-09 (×3): qty 2

## 2022-08-09 MED ORDER — ZINC SULFATE 220 (50 ZN) MG PO CAPS: 220.0000 mg | ORAL_CAPSULE | Freq: Every day | ORAL | Status: DC

## 2022-08-09 MED ORDER — PANTOPRAZOLE SODIUM 40 MG PO TBEC: 40.0000 mg | DELAYED_RELEASE_TABLET | Freq: Every day | ORAL | Status: DC

## 2022-08-09 MED ORDER — ALUM & MAG HYDROXIDE-SIMETH 200-200-20 MG/5ML PO SUSP: 15.0000 mL | ORAL | Status: DC | PRN

## 2022-08-09 MED ORDER — ONDANSETRON HCL 4 MG/2ML IJ SOLN
INTRAMUSCULAR | Status: AC
  Filled 2022-08-09: qty 2

## 2022-08-09 MED ORDER — ONDANSETRON HCL 4 MG/2ML IJ SOLN: 4.0000 mg | Freq: Four times a day (QID) | INTRAMUSCULAR | Status: DC | PRN

## 2022-08-09 MED ORDER — CEFAZOLIN SODIUM 1 G IJ SOLR
INTRAMUSCULAR | Status: AC
  Filled 2022-08-09: qty 20

## 2022-08-09 MED ORDER — ACETAMINOPHEN 325 MG PO TABS: 325.0000 mg | ORAL_TABLET | Freq: Four times a day (QID) | ORAL | Status: DC | PRN

## 2022-08-09 MED ORDER — FENTANYL CITRATE (PF) 100 MCG/2ML IJ SOLN
25.0000 ug | INTRAMUSCULAR | Status: DC | PRN
  Administered 2022-08-09 (×2): 50 ug via INTRAVENOUS

## 2022-08-09 MED ORDER — CEFAZOLIN SODIUM-DEXTROSE 2-3 GM-%(50ML) IV SOLR
INTRAVENOUS | Status: DC | PRN
  Administered 2022-08-09: 2 g via INTRAVENOUS

## 2022-08-09 MED ORDER — KETOROLAC TROMETHAMINE 30 MG/ML IJ SOLN
INTRAMUSCULAR | Status: AC
  Filled 2022-08-09: qty 1

## 2022-08-09 MED ORDER — PHENOL 1.4 % MT LIQD: 1.0000 | OROMUCOSAL | Status: DC | PRN

## 2022-08-09 MED ORDER — LIDOCAINE 2% (20 MG/ML) 5 ML SYRINGE
INTRAMUSCULAR | Status: AC
  Filled 2022-08-09: qty 5

## 2022-08-09 MED ORDER — AMISULPRIDE (ANTIEMETIC) 5 MG/2ML IV SOLN: 10.0000 mg | Freq: Once | INTRAVENOUS | Status: DC | PRN

## 2022-08-09 MED ORDER — 0.9 % SODIUM CHLORIDE (POUR BTL) OPTIME
TOPICAL | Status: DC | PRN
  Administered 2022-08-09: 1000 mL

## 2022-08-09 MED ORDER — VANCOMYCIN HCL 1500 MG/300ML IV SOLN
1500.0000 mg | Freq: Once | INTRAVENOUS | Status: AC
  Administered 2022-08-09: 1500 mg via INTRAVENOUS
  Filled 2022-08-09: qty 300

## 2022-08-09 MED ORDER — GUAIFENESIN-DM 100-10 MG/5ML PO SYRP: 15.0000 mL | ORAL_SOLUTION | ORAL | Status: DC | PRN

## 2022-08-09 MED ORDER — MIDAZOLAM HCL 2 MG/2ML IJ SOLN
INTRAMUSCULAR | Status: AC
  Filled 2022-08-09: qty 2

## 2022-08-09 MED ORDER — MAGNESIUM CITRATE PO SOLN: 1.0000 | Freq: Once | ORAL | Status: DC | PRN

## 2022-08-09 MED ORDER — FENTANYL CITRATE (PF) 250 MCG/5ML IJ SOLN
INTRAMUSCULAR | Status: AC
  Filled 2022-08-09: qty 5

## 2022-08-09 MED ORDER — ONDANSETRON HCL 4 MG/2ML IJ SOLN
INTRAMUSCULAR | Status: DC | PRN
  Administered 2022-08-09: 4 mg via INTRAVENOUS

## 2022-08-09 MED ORDER — POTASSIUM CHLORIDE CRYS ER 20 MEQ PO TBCR: 20.0000 meq | EXTENDED_RELEASE_TABLET | Freq: Every day | ORAL | Status: DC | PRN

## 2022-08-09 MED ORDER — DEXAMETHASONE SODIUM PHOSPHATE 10 MG/ML IJ SOLN
INTRAMUSCULAR | Status: DC | PRN
  Administered 2022-08-09: 5 mg via INTRAVENOUS

## 2022-08-09 MED ORDER — LIDOCAINE 2% (20 MG/ML) 5 ML SYRINGE
INTRAMUSCULAR | Status: DC | PRN
  Administered 2022-08-09: 20 mg via INTRAVENOUS

## 2022-08-09 MED ORDER — MIDAZOLAM HCL 5 MG/5ML IJ SOLN
INTRAMUSCULAR | Status: DC | PRN
  Administered 2022-08-09: 2 mg via INTRAVENOUS

## 2022-08-09 MED ORDER — DOCUSATE SODIUM 100 MG PO CAPS
100.0000 mg | ORAL_CAPSULE | Freq: Every day | ORAL | Status: DC
  Administered 2022-08-10 – 2022-08-11 (×2): 100 mg via ORAL
  Filled 2022-08-09 (×2): qty 1

## 2022-08-09 MED ORDER — PROPOFOL 10 MG/ML IV BOLUS
INTRAVENOUS | Status: AC
  Filled 2022-08-09: qty 20

## 2022-08-09 MED ORDER — OXYCODONE HCL 5 MG PO TABS
10.0000 mg | ORAL_TABLET | ORAL | Status: DC | PRN
  Administered 2022-08-11: 15 mg via ORAL
  Filled 2022-08-09: qty 3

## 2022-08-09 MED ORDER — HYDROMORPHONE HCL 1 MG/ML IJ SOLN
0.5000 mg | INTRAMUSCULAR | Status: DC | PRN
  Administered 2022-08-09 – 2022-08-10 (×4): 1 mg via INTRAVENOUS
  Filled 2022-08-09 (×4): qty 1

## 2022-08-09 MED ORDER — CHLORHEXIDINE GLUCONATE CLOTH 2 % EX PADS
6.0000 | MEDICATED_PAD | Freq: Every day | CUTANEOUS | Status: DC
  Administered 2022-08-11: 6 via TOPICAL

## 2022-08-09 MED ORDER — ACETAMINOPHEN 10 MG/ML IV SOLN
INTRAVENOUS | Status: DC | PRN
  Administered 2022-08-09: 1000 mg via INTRAVENOUS

## 2022-08-09 MED ORDER — BISACODYL 5 MG PO TBEC: 5.0000 mg | DELAYED_RELEASE_TABLET | Freq: Every day | ORAL | Status: DC | PRN

## 2022-08-09 MED ORDER — FENTANYL CITRATE (PF) 100 MCG/2ML IJ SOLN
INTRAMUSCULAR | Status: AC
  Filled 2022-08-09: qty 2

## 2022-08-09 MED ORDER — OXYCODONE HCL 5 MG PO TABS: 5.0000 mg | ORAL_TABLET | ORAL | Status: DC | PRN

## 2022-08-09 SURGICAL SUPPLY — 40 items
BAG COUNTER SPONGE SURGICOUNT (BAG) IMPLANT
BAG SPNG CNTER NS LX DISP (BAG)
BLADE SAW RECIP 87.9 MT (BLADE) ×1 IMPLANT
BLADE SURG 21 STRL SS (BLADE) ×1 IMPLANT
BNDG COHESIVE 6X5 TAN STRL LF (GAUZE/BANDAGES/DRESSINGS) ×1 IMPLANT
CANISTER WOUND CARE 500ML ATS (WOUND CARE) IMPLANT
COVER SURGICAL LIGHT HANDLE (MISCELLANEOUS) ×1 IMPLANT
CUFF TOURN SGL QUICK 34 (TOURNIQUET CUFF)
CUFF TRNQT CYL 34X4.125X (TOURNIQUET CUFF) IMPLANT
DRAPE INCISE IOBAN 66X45 STRL (DRAPES) ×2 IMPLANT
DRAPE U-SHAPE 47X51 STRL (DRAPES) ×1 IMPLANT
DRESSING PREVENA PLUS CUSTOM (GAUZE/BANDAGES/DRESSINGS) ×1 IMPLANT
DRSG PREVENA PLUS CUSTOM (GAUZE/BANDAGES/DRESSINGS) ×1
DURAPREP 26ML APPLICATOR (WOUND CARE) ×1 IMPLANT
ELECT REM PT RETURN 9FT ADLT (ELECTROSURGICAL) ×1
ELECTRODE REM PT RTRN 9FT ADLT (ELECTROSURGICAL) ×1 IMPLANT
GLOVE BIOGEL PI IND STRL 7.5 (GLOVE) ×1 IMPLANT
GLOVE BIOGEL PI IND STRL 9 (GLOVE) ×1 IMPLANT
GLOVE SURG ORTHO 9.0 STRL STRW (GLOVE) ×1 IMPLANT
GLOVE SURG SS PI 6.5 STRL IVOR (GLOVE) ×1 IMPLANT
GOWN STRL REUS W/ TWL LRG LVL3 (GOWN DISPOSABLE) ×1 IMPLANT
GOWN STRL REUS W/ TWL XL LVL3 (GOWN DISPOSABLE) ×2 IMPLANT
GOWN STRL REUS W/TWL LRG LVL3 (GOWN DISPOSABLE) ×1
GOWN STRL REUS W/TWL XL LVL3 (GOWN DISPOSABLE) ×2
GRAFT SKIN WND MICRO 38 (Tissue) IMPLANT
KIT BASIN OR (CUSTOM PROCEDURE TRAY) ×1 IMPLANT
KIT TURNOVER KIT B (KITS) ×1 IMPLANT
MANIFOLD NEPTUNE II (INSTRUMENTS) ×1 IMPLANT
NS IRRIG 1000ML POUR BTL (IV SOLUTION) ×1 IMPLANT
PACK ORTHO EXTREMITY (CUSTOM PROCEDURE TRAY) ×1 IMPLANT
PAD ARMBOARD 7.5X6 YLW CONV (MISCELLANEOUS) ×1 IMPLANT
PREVENA RESTOR ARTHOFORM 46X30 (CANNISTER) ×1 IMPLANT
STAPLER VISISTAT 35W (STAPLE) IMPLANT
STOCKINETTE IMPERVIOUS LG (DRAPES) IMPLANT
SUT ETHILON 2 0 PSLX (SUTURE) ×2 IMPLANT
SUT SILK 2 0 (SUTURE) ×1
SUT SILK 2-0 18XBRD TIE 12 (SUTURE) ×1 IMPLANT
TOWEL GREEN STERILE FF (TOWEL DISPOSABLE) ×1 IMPLANT
TUBE CONNECTING 20X1/4 (TUBING) ×1 IMPLANT
YANKAUER SUCT BULB TIP NO VENT (SUCTIONS) ×1 IMPLANT

## 2022-08-09 NOTE — Evaluation (Signed)
Physical Therapy Evaluation Patient Details Name: Jeffery Gross MRN: [id number] DOB: [birth year] Today's Date: 08/09/2022  History of Present Illness  Pt is 62 yo male who presents on 08/06/22 with abscess s/p R AKA revision 07/04/22 and oral antibiotic course. S/p R AKA revision and wound vac placement 1/20. PMH: B AKA 01/09/16, HTN, GERD, back pain, depression, DVT, smoker   Clinical Impression  Pt presents with condition above and deficits mentioned below, see PT Problem List. PTA, he was mod I for all functional mobility and ADLs, using his w/c for mobility since his prostheses do not currently fit. Pt jumps from living with his parents in their 1-level house with a ramped entrance to/from living with a friend in their 1-level house with several stairs to access. Pt bumps up and down stairs on his buttocks. Currently, pt is functioning close to his baseline, but is having some increased difficulty and needing increased time for functional mobility due to his R residual limb pain. Pt is anticipate to progress well at his pain improves. Will continue to follow acutely to maximize his return to baseline prior to d/c home. Recommending pt follow up with his prosthetist at d/c.       Recommendations for follow up therapy are one component of a multi-disciplinary discharge planning process, led by the attending physician.  Recommendations may be updated based on patient status, additional functional criteria and insurance authorization.  Follow Up Recommendations Other (comment) (Follow up with prosthetist)      Assistance Recommended at Discharge PRN  Patient can return home with the following  A little help with walking and/or transfers;A little help with bathing/dressing/bathroom;Assistance with cooking/housework;Assist for transportation    Equipment Recommendations None recommended by PT  Recommendations for Other Services       Functional Status Assessment Patient has had a recent decline  in their functional status and demonstrates the ability to make significant improvements in function in a reasonable and predictable amount of time.     Precautions / Restrictions Precautions Precautions: Fall;Other (comment) Precaution Comments: R leg wound vac; bil AKA 2017 (prostheses don't fit) Restrictions Weight Bearing Restrictions: Yes RLE Weight Bearing: Non weight bearing      Mobility  Bed Mobility Overal bed mobility: Modified Independent             General bed mobility comments: Pt able to transition supine <> sit EOB with HOB elevated without assistance, extra time due to pain though    Transfers Overall transfer level: Needs assistance Equipment used: None              Lateral/Scoot Transfers: Min guard General transfer comment: Pt scooting laterally along EOB with min guard for safety, extra time due to pain. Declined attempt to transfer to w/c due to pain this date.    Ambulation/Gait               General Gait Details: N/A  Stairs            Wheelchair Mobility    Modified Rankin (Stroke Patients Only)       Balance Overall balance assessment: Needs assistance Sitting-balance support: No upper extremity supported, Feet supported Sitting balance-Leahy Scale: Good         Standing balance comment: N/A                             Pertinent Vitals/Pain Pain Assessment Pain Assessment: Faces Faces Pain Scale: Hurts  even more Pain Location: R residual limb Pain Descriptors / Indicators: Discomfort, Grimacing, Operative site guarding Pain Intervention(s): Limited activity within patient's tolerance, Monitored during session, Repositioned    Home Living Family/patient expects to be discharged to:: Private residence Living Arrangements: Parent;Other (Comment) (sometimes friend's house and sometimes parents' house) Available Help at Discharge: Family;Friend(s) Type of Home: House Home Access: [place]: One level [place]: Conservation officer, nature (2 wheels);Wheelchair - manual;BSC/3in1 Additional Comments: Info above is for parents' house, but sometimes he lives with a friend who lives in a 1-level house with stairs to enter and a walk-in shower.    Prior Function Prior Level of Function : Independent/Modified Independent             Mobility Comments: independent with use of Bil prostheses but they don't fit currently, is trying to get to his prosthetist; currently using his w/c for mod I mobility ADLs Comments: Mod I, including mowing in his w/c     Hand Dominance        Extremity/Trunk Assessment   Upper Extremity Assessment Upper Extremity Assessment: Defer to OT evaluation    Lower Extremity Assessment Lower Extremity Assessment: RLE deficits/detail;LLE deficits/detail RLE Deficits / Details: R AKA since 2017, revision 08/06/22 with wound vac in place, [facility] hip AROM LLE Deficits / Details: R AKA since 2017, WFL hip AROM    Cervical / Trunk Assessment Cervical / Trunk Assessment: Normal  Communication   Communication: No difficulties  Cognition Arousal/Alertness: Awake/alert Behavior During Therapy: WFL for tasks assessed/performed Overall Cognitive Status: Within Functional Limits for tasks assessed                                 General Comments: Jokes around often, very pleasant        General Comments General comments (skin integrity, edema, etc.): encouraged bil hip AROM in all directions    Exercises     Assessment/Plan    PT Assessment Patient needs continued PT services  PT Problem List Decreased activity tolerance;Decreased balance;Decreased mobility;Decreased skin integrity;Pain       PT Treatment Interventions DME instruction;Functional mobility training;Therapeutic activities;Therapeutic exercise;Balance training;Neuromuscular re-education;Patient/family education;Wheelchair mobility training    PT Goals  (Current goals can be found in the Care Plan section)  Acute Rehab PT Goals Patient Stated Goal: to heal his wound PT Goal Formulation: With patient/family Time For Goal Achievement: 08/23/22 Potential to Achieve Goals: Good    Frequency Min 2X/week     Co-evaluation               AM-PAC PT "6 Clicks" Mobility  Outcome Measure Help needed turning from your back to your side while in a flat bed without using bedrails?: None Help needed moving from lying on your back to sitting on the side of a flat bed without using bedrails?: None Help needed moving to and from a bed to a chair (including a wheelchair)?: A Little Help needed standing up from a chair using your arms (e.g., wheelchair or bedside chair)?: Total Help needed to walk in hospital room?: Total Help needed climbing 3-5 steps with a railing? : Total 6 Click Score: 14    End of Session   Activity Tolerance: Patient tolerated treatment well Patient left: in bed;with call bell/phone within reach;with bed alarm set;with family/visitor present   PT Visit Diagnosis: Pain;Other (comment) (difficulty in transfers) Pain - Right/Left:  Right Pain - part of body: Leg    Time: 2761-4709 PT Time Calculation (min) (ACUTE ONLY): 33 min   Charges:   PT Evaluation $PT Eval Moderate Complexity: 1 Mod PT Treatments $Therapeutic Activity: 8-22 mins        Moishe Spice, PT, DPT Acute Rehabilitation Services  Office: [phone]   Orvan Falconer 08/09/2022, 5:16 PM

## 2022-08-09 NOTE — Progress Notes (Signed)
PT Cancellation Note  Patient Details Name: KENG JEWEL MRN: [id number] DOB: 06-18-1961   Cancelled Treatment:    Reason Eval/Treat Not Completed: Patient at procedure or test/unavailable. Will plan to follow up later as time permits.   Moishe Spice, PT, DPT Acute Rehabilitation Services  Office: [place] 08/09/2022, 7:22 AM

## 2022-08-09 NOTE — Progress Notes (Signed)
Inpatient Rehab Admissions Coordinator:   CIR consult received, await PT/OT evals in order to make a full assessment of candidacy.   Clemens Catholic, [place], [place] Admissions Coordinator  [phone] ([place]) [phone] (office)

## 2022-08-09 NOTE — Op Note (Signed)
08/09/2022  8:56 AM  PATIENT:  Jeffery Gross    PRE-OPERATIVE DIAGNOSIS:  Right leg Abscess  POST-OPERATIVE DIAGNOSIS:  Same  PROCEDURE:  RIGHT REVISION AMPUTATION ABOVE KNEE Application Kerecis micro graft 38 cm. Application of Prevena customizable and Arnell Sieving will form wound VAC. Tissue sent for cultures.  SURGEON:  Newt Minion, MD  PHYSICIAN ASSISTANT:None ANESTHESIA:   General  PREOPERATIVE INDICATIONS:  Jeffery Gross is a  62 y.o. male with a diagnosis of Right leg Abscess who failed conservative measures and elected for surgical management.    The risks benefits and alternatives were discussed with the patient preoperatively including but not limited to the risks of infection, bleeding, nerve injury, cardiopulmonary complications, the need for revision surgery, among others, and the patient was willing to proceed.  OPERATIVE IMPLANTS: Kerecis micro graft.  @ENCIMAGES @  OPERATIVE FINDINGS: Patient had ischemic tissue that was sent for cultures.  No purulent abscess.  OPERATIVE PROCEDURE: Patient brought the operating room underwent a general anesthetic.  After adequate levels anesthesia were obtained patient's right lower extremity was prepped using DuraPrep draped into a sterile field a timeout was called.  A fishmouth incision was made around the residual limb on the right through healthy tissue outside the zone of draining ulceration.  This was carried down to bone.  There is no purulent abscess but there was ischemic changes medial aspect of the thigh.  This tissue was sent for cultures.  The distal femur was resected 2 cm with a reciprocating saw.  No signs of infection in the bone.  The wound was irrigated with normal saline electrocautery was used for hemostasis.  The wound was filled with 38 cm of Kerecis micro graft.  The deep fascial layer was closed using #1 Vicryl.  The skin was closed using staples.  The customizable and Arnell Sieving form wound VAC was applied this had a  good suction fit.  Patient was extubated taken the PACU in stable condition.   DISCHARGE PLANNING:  Antibiotic duration: Would continue oral antibiotics based on culture sensitivities.  Weightbearing: Transfer training  Pain medication: Opioid pathway  Dressing care/ Wound VAC: Continue wound VAC for 1 week  Ambulatory devices: Wheelchair  Discharge to: Discharge planning based on therapy recommendations.  Follow-up: In the office 1 week post operative.

## 2022-08-09 NOTE — Interval H&P Note (Signed)
History and Physical Interval Note:  08/09/2022 7:57 AM  Jeffery Gross  has presented today for surgery, with the diagnosis of Abscess.  The various methods of treatment have been discussed with the patient and family. After consideration of risks, benefits and other options for treatment, the patient has consented to  Procedure(s): RIGHT REVISION AMPUTATION ABOVE KNEE (Right) as a surgical intervention.  The patient's history has been reviewed, patient examined, no change in status, stable for surgery.  I have reviewed the patient's chart and labs.  Questions were answered to the patient's satisfaction.     Newt Minion

## 2022-08-09 NOTE — Progress Notes (Signed)
Pharmacy Antibiotic Note  Jeffery Gross is a 62 y.o. male admitted on 08/06/2022 with  a right leg abscess .  He presents with pain, redness and purulent drainage from right stump Pharmacy has been consulted for vancomycin and cefepime dosing. Flagyl per MD. He underwent a revision of his right AKA by Dr. Sharol Given on 1/20.   Plan:  Give Vancomycin 1500mg  IV x 1 now  Start Vancomycin 750mg  IV q12 for maintenance (eAUC 473.4, TBW, Vd 0.72, Scr 1.1)  Start cefepime 2g q12 hours  Start Flagyl 500mg  BID   Height: 4' (121.9 cm) Weight: 70.3 kg (154 lb 15.7 oz) (per Dec 2023 chart review) IBW/kg (Calculated) : 22.4  Temp (24hrs), Avg:98.3 F (36.8 C), Min:97.7 F (36.5 C), Max:99 F (37.2 C)  Recent Labs  Lab 08/06/22 0254 08/06/22 1156 08/07/22 0333  WBC 8.7 10.3  --   CREATININE 1.10 1.06 1.10    Estimated Creatinine Clearance: 41.5 mL/min (by C-G formula based on SCr of 1.1 mg/dL).    Allergies  Allergen Reactions   Sulfacetamide Sodium Swelling    Edema     Antimicrobials this admission: 1/17 Vancomycin 1500 x 1  1/17 Ceftriaxone 2g x 1   Dose adjustments this admission: N/A  Microbiology results: 1/20: Deep Wound Culture: sent   Thank you for allowing pharmacy to be a part of this patient's care.  Vicenta Dunning, PharmD  PGY1 Pharmacy Resident

## 2022-08-09 NOTE — Anesthesia Postprocedure Evaluation (Signed)
Anesthesia Post Note  Patient: Jeffery Gross  Procedure(s) Performed: RIGHT REVISION AMPUTATION ABOVE KNEE (Right: Knee)     Patient location during evaluation: PACU Anesthesia Type: General Level of consciousness: awake and alert Pain management: pain level controlled Vital Signs Assessment: post-procedure vital signs reviewed and stable Respiratory status: spontaneous breathing, nonlabored ventilation, respiratory function stable and patient connected to nasal cannula oxygen Cardiovascular status: blood pressure returned to baseline and stable Postop Assessment: no apparent nausea or vomiting Anesthetic complications: no  No notable events documented.  Last Vitals:  Vitals:   08/09/22 1200 08/09/22 1704  BP: 117/63 118/70  Pulse: 67 77  Resp: 18 18  Temp:  36.7 C  SpO2: 98% 96%    Last Pain:  Vitals:   08/09/22 1704  TempSrc: Oral  PainSc:                  Tiajuana Amass

## 2022-08-09 NOTE — Transfer of Care (Signed)
Immediate Anesthesia Transfer of Care Note  Patient: Jeffery Gross  Procedure(s) Performed: RIGHT REVISION AMPUTATION ABOVE KNEE (Right: Knee)  Patient Location: PACU  Anesthesia Type:General  Level of Consciousness: drowsy  Airway & Oxygen Therapy: Patient Spontanous Breathing and Patient connected to nasal cannula oxygen  Post-op Assessment: Report given to RN and Post -op Vital signs reviewed and stable  Post vital signs: Reviewed and stable  Last Vitals:  Vitals Value Taken Time  BP 133/84 08/09/22 0852  Temp    Pulse 59 08/09/22 0854  Resp 6 08/09/22 0854  SpO2 97 % 08/09/22 0854  Vitals shown include unvalidated device data.  Last Pain:  Vitals:   08/09/22 0651  TempSrc:   PainSc: 0-No pain      Patients Stated Pain Goal: 7 (50/93/26 7124)  Complications: No notable events documented.

## 2022-08-09 NOTE — Progress Notes (Signed)
OT Cancellation Note  Patient Details Name: Jeffery Gross MRN: [id number] DOB: [birth year]   Cancelled Treatment:    Reason Eval/Treat Not Completed: (P) Patient at procedure or test/ unavailable (surgery)  Joshuajames Moehring,[place] 08/09/2022, 7:17 AM Maurie Boettcher, OT/L   Acute OT Clinical Specialist [place] Pager [phone] Office [phone]

## 2022-08-09 NOTE — Anesthesia Procedure Notes (Signed)
Procedure Name: LMA Insertion Date/Time: 08/09/2022 8:07 AM  Performed by: Colin Benton, CRNAPre-anesthesia Checklist: Patient identified, Emergency Drugs available, Suction available and Patient being monitored Patient Re-evaluated:Patient Re-evaluated prior to induction Oxygen Delivery Method: Circle System Utilized Preoxygenation: Pre-oxygenation with 100% oxygen Induction Type: IV induction Ventilation: Mask ventilation without difficulty LMA: LMA inserted LMA Size: 5.0 Number of attempts: 1 Placement Confirmation: positive ETCO2 Tube secured with: Tape Dental Injury: Teeth and Oropharynx as per pre-operative assessment

## 2022-08-09 NOTE — Anesthesia Preprocedure Evaluation (Signed)
Anesthesia Evaluation  Patient identified by MRN, date of birth, ID band Patient awake    Reviewed: Allergy & Precautions, NPO status , Patient's Chart, lab work & pertinent test results  Airway Mallampati: II  TM Distance: >3 FB Neck ROM: Full    Dental no notable dental hx. (+) Teeth Intact, Dental Advisory Given   Pulmonary asthma , Current Smoker   Pulmonary exam normal breath sounds clear to auscultation       Cardiovascular hypertension, Pt. on medications + Peripheral Vascular Disease  Normal cardiovascular exam Rhythm:Regular Rate:Normal     Neuro/Psych  PSYCHIATRIC DISORDERS  Depression     Neuromuscular disease    GI/Hepatic Neg liver ROS,GERD  ,,  Endo/Other  negative endocrine ROS    Renal/GU Renal disease            Musculoskeletal   Abdominal  (+) + obese  Peds  Hematology negative hematology ROS (+)   Anesthesia Other Findings All: sulfa  Reproductive/Obstetrics                             Anesthesia Physical Anesthesia Plan  ASA: 3  Anesthesia Plan: General   Post-op Pain Management: Ofirmev IV (intra-op)* and Toradol IV (intra-op)*   Induction: Intravenous  PONV Risk Score and Plan: 1 and Treatment may vary due to age or medical condition, Midazolam, Dexamethasone and Ondansetron  Airway Management Planned: LMA  Additional Equipment: None  Intra-op Plan:   Post-operative Plan:   Informed Consent: I have reviewed the patients History and Physical, chart, labs and discussed the procedure including the risks, benefits and alternatives for the proposed anesthesia with the patient or authorized representative who has indicated his/her understanding and acceptance.     Dental advisory given  Plan Discussed with:   Anesthesia Plan Comments:         Anesthesia Quick Evaluation

## 2022-08-09 NOTE — Progress Notes (Signed)
PROGRESS NOTE  BOBAK OGUINN VOZ:[id number] DOB: [birth year]   PCP: Sandi Mariscal, MD  Patient is from: Home  DOA: 08/06/2022 LOS: 3  Chief complaints Chief Complaint  Patient presents with   Post-op Problem     Brief Narrative / Interim history: 62 year old M with PMH of bilateral AKA, polysubstance use including cocaine, tobacco and alcohol, chronic back pain on opiate and GERD presented to the pain hospital with pain, erythema and purulent drainage from right AKA stump after he completed oral antibiotic course outpatient.  He is followed by Dr. Sharol Given.  He had no leukocytosis.  ESR and CRP within normal.  Started on ceftriaxone and vancomycin and transferred to [facility] for evaluation by Dr. Sharol Given per recommendation by Dr. Sammuel Hines.  UDS positive for cocaine.  Antibiotics held on 1/18 until evaluation by orthopedic surgery to obtain wound culture.  He had revision of right AKA stump with wound VAC placement on 1/20.  Antibiotic resumed with IV cefepime, Flagyl and vancomycin pending surgical tissue culture.  Subjective: Seen and examined this afternoon after he returned from surgery.  A little sore from surgery but no acute complaints.  Objective: Vitals:   08/09/22 0956 08/09/22 1014 08/09/22 1100 08/09/22 1200  BP: 119/73 111/66 116/71 117/63  Pulse: 64 62 (!) 55 67  Resp: 18   18  Temp: 98 F (36.7 C)     TempSrc: Oral     SpO2: 95% 95% 97% 98%  Weight:  70.3 kg    Height:  4' (1.219 m)      Examination:   GENERAL: No apparent distress.  Nontoxic. HEENT: MMM.  Vision and hearing grossly intact.  NECK: Supple.  No apparent JVD.  RESP:  No IWOB.  Fair aeration bilaterally. CVS:  RRR. Heart sounds normal.  ABD/GI/GU: BS+. Abd soft, NTND.  MSK/EXT: Bilateral AKA.  Wound VAC to right AKA stump. SKIN: no apparent skin lesion or wound NEURO: Awake and alert. Oriented appropriately.  No apparent focal neuro deficit. PSYCH: Calm. Normal affect.   Procedures:  1/20-right AKA  revision  Microbiology summarized: None  Assessment and plan: Principal Problem:   Amputation stump infection ([place]) Active Problems:   Chronic pain syndrome   Opioid dependence (HCC)   GERD (gastroesophageal reflux disease)   Cocaine use   Tobacco use disorder  Persistent/recalcitrant right stump infection: Presents with pain, redness and purulent drainage from right stump.  Some purulent drainage on exam.  No leukocytosis.  CRP and ESR within normal.  No fever or chills.  Has history of MRSA and 2 recent hospitalizations. -S/p right AKA revision by Dr. Sharol Given 1/20. -Wound VAC for 1 week per Dr. Sharol Given. -Start broad-spectrum antibiotics with IV cefepime, vancomycin and Flagyl pending surgical culture -Need to quit smoking.   Polysubstance use including cocaine, alcohol and tobacco:  -Encouraged to refrain from cocaine and alcohol use. -Encouraged smoking cessation -Nicotine patch 21 mg daily ordered   Chronic pain/opioid dependence -Continue home cymbalta, gabapentin and oxycodone  Bilateral AKA: Uses wheelchair at baseline.  Able to transfer.   GERD  -protonix ordered for GI protection   Increased nutrient needs: Body mass index is 47.29 kg/m. Nutrition Problem: Increased nutrient needs Etiology: wound healing Signs/Symptoms: estimated needs Interventions: Ensure Enlive (each supplement provides 350kcal and 20 grams of protein), MVI, Juven   DVT prophylaxis:  heparin injection 5,000 Units Start: 08/06/22 1400  Code Status: Full code Family Communication: None at bedside Level of care: Med-Surg Status is: Inpatient Remains inpatient appropriate  because: Due to right AKA stump infection   Final disposition: TBD Consultants:  Orthopedic surgery  35 minutes with more than 50% spent in reviewing records, counseling patient/family and coordinating care.   Sch Meds:  Scheduled Meds:  vitamin C  1,000 mg Oral Daily   Chlorhexidine Gluconate Cloth  6 each Topical  Daily   [START ON 08/10/2022] docusate sodium  100 mg Oral Daily   DULoxetine  60 mg Oral q AM   fentaNYL       gabapentin  300 mg Oral TID   heparin  5,000 Units Subcutaneous Q8H   multivitamin with minerals  1 tablet Oral Daily   nicotine  21 mg Transdermal Daily   nutrition supplement (JUVEN)  1 packet Oral BID BM   oxyCODONE  20 mg Oral 5 X Daily   pantoprazole  40 mg Oral Daily   polyethylene glycol  17 g Oral BID   Ensure Max Protein  11 oz Oral BID   senna  1 tablet Oral QHS   zinc sulfate  220 mg Oral Daily   Continuous Infusions:  sodium chloride 75 mL/hr at 08/09/22 1145   ceFEPime (MAXIPIME) IV     magnesium sulfate bolus IVPB     metronidazole 500 mg (08/09/22 1146)   vancomycin     [START ON 08/10/2022] vancomycin     PRN Meds:.acetaminophen **OR** acetaminophen, alum & mag hydroxide-simeth, bisacodyl, fentaNYL, guaiFENesin-dextromethorphan, hydrALAZINE, HYDROmorphone (DILAUDID) injection, labetalol, magnesium citrate, magnesium sulfate bolus IVPB, metoprolol tartrate, naLOXone (NARCAN)  injection, ondansetron **OR** [DISCONTINUED] ondansetron (ZOFRAN) IV, oxyCODONE, oxyCODONE, phenol, polyethylene glycol, potassium chloride, traZODone  Antimicrobials: Anti-infectives (From admission, onward)    Start     Dose/Rate Route Frequency Ordered Stop   08/10/22 0100  vancomycin (VANCOREADY) IVPB 750 mg/150 mL        750 mg 150 mL/hr over 60 Minutes Intravenous Every 12 hours 08/09/22 1232     08/09/22 1330  vancomycin (VANCOREADY) IVPB 1500 mg/300 mL        1,500 mg 150 mL/hr over 120 Minutes Intravenous  Once 08/09/22 1232     08/09/22 1330  ceFEPIme (MAXIPIME) 2 g in sodium chloride 0.9 % 100 mL IVPB        2 g 200 mL/hr over 30 Minutes Intravenous Every 12 hours 08/09/22 1232     08/09/22 1100  metroNIDAZOLE (FLAGYL) IVPB 500 mg        500 mg 100 mL/hr over 60 Minutes Intravenous Every 12 hours 08/09/22 1011     08/09/22 1030  ceFAZolin (ANCEF) IVPB 2g/100 mL premix   Status:  Discontinued        2 g 200 mL/hr over 30 Minutes Intravenous Every 8 hours 08/09/22 0943 08/09/22 1055   08/07/22 0800  cefTRIAXone (ROCEPHIN) 2 g in sodium chloride 0.9 % 100 mL IVPB  Status:  Discontinued        2 g 200 mL/hr over 30 Minutes Intravenous Every 24 hours 08/06/22 1131 08/07/22 1313   08/06/22 2200  vancomycin (VANCOREADY) IVPB 500 mg/100 mL  Status:  Discontinued        500 mg 100 mL/hr over 60 Minutes Intravenous Every 24 hours 08/06/22 0646 08/07/22 1313   08/06/22 1400  metroNIDAZOLE (FLAGYL) tablet 500 mg  Status:  Discontinued        500 mg Oral Every 12 hours 08/06/22 1131 08/07/22 1313   08/06/22 0530  cefTRIAXone (ROCEPHIN) 2 g in sodium chloride 0.9 % 100 mL IVPB  2 g 200 mL/hr over 30 Minutes Intravenous  Once 08/06/22 0526 08/06/22 0917   08/06/22 0530  vancomycin (VANCOREADY) IVPB 1500 mg/300 mL        1,500 mg 150 mL/hr over 120 Minutes Intravenous  Once 08/06/22 0529 08/06/22 1019        I have personally reviewed the following labs and images: CBC: Recent Labs  Lab 08/06/22 0254 08/06/22 1156  WBC 8.7 10.3  HGB 14.5 13.5  HCT 44.9 41.4  MCV 85.5 85.5  PLT 248 229   BMP &GFR Recent Labs  Lab 08/06/22 0254 08/06/22 1156 08/07/22 0333  NA 139  --  140  K 3.4*  --  4.3  CL 101  --  106  CO2 29  --  27  GLUCOSE 128*  --  110*  BUN 22  --  18  CREATININE 1.10 1.06 1.10  CALCIUM 9.4  --  8.6*  MG  --   --  2.1   Estimated Creatinine Clearance: 41.5 mL/min (by C-G formula based on SCr of 1.1 mg/dL). Liver & Pancreas: Recent Labs  Lab 08/06/22 0254 08/07/22 0333  AST 18 13*  ALT 12 10  ALKPHOS 101 72  BILITOT 0.3 0.1*  PROT 6.9 5.6*  ALBUMIN 3.9 3.1*   No results for input(s): "LIPASE", "AMYLASE" in the last 168 hours. No results for input(s): "AMMONIA" in the last 168 hours. Diabetic: No results for input(s): "HGBA1C" in the last 72 hours. No results for input(s): "GLUCAP" in the last 168 hours. Cardiac  Enzymes: No results for input(s): "CKTOTAL", "CKMB", "CKMBINDEX", "TROPONINI" in the last 168 hours. No results for input(s): "PROBNP" in the last 8760 hours. Coagulation Profile: No results for input(s): "INR", "PROTIME" in the last 168 hours. Thyroid Function Tests: No results for input(s): "TSH", "T4TOTAL", "FREET4", "T3FREE", "THYROIDAB" in the last 72 hours. Lipid Profile: No results for input(s): "CHOL", "HDL", "LDLCALC", "TRIG", "CHOLHDL", "LDLDIRECT" in the last 72 hours. Anemia Panel: No results for input(s): "VITAMINB12", "FOLATE", "FERRITIN", "TIBC", "IRON", "RETICCTPCT" in the last 72 hours. Urine analysis:    Component Value Date/Time   COLORURINE YELLOW 01/24/2016 0754   APPEARANCEUR Clear 04/24/2020 1327   LABSPEC 1.015 01/24/2016 0754   PHURINE 5.5 01/24/2016 0754   GLUCOSEU Negative 04/24/2020 1327   HGBUR NEGATIVE 01/24/2016 0754   BILIRUBINUR Negative 04/24/2020 1327   KETONESUR NEGATIVE 01/24/2016 0754   PROTEINUR Negative 04/24/2020 1327   PROTEINUR NEGATIVE 01/24/2016 0754   NITRITE Negative 04/24/2020 1327   NITRITE NEGATIVE 01/24/2016 0754   LEUKOCYTESUR Negative 04/24/2020 1327   Sepsis Labs: Invalid input(s): "PROCALCITONIN", "LACTICIDVEN"  Microbiology: No results found for this or any previous visit (from the past 240 hour(s)).  Radiology Studies: No results found.    Yusef Lamp T. Apurva Reily [facility]  If 7PM-7AM, please contact night-coverage [email or web address] 08/09/2022, 1:11 PM

## 2022-08-10 DIAGNOSIS — F17200 Nicotine dependence, unspecified, uncomplicated: Secondary | ICD-10-CM | POA: Diagnosis not present

## 2022-08-10 DIAGNOSIS — F1490 Cocaine use, unspecified, uncomplicated: Secondary | ICD-10-CM | POA: Diagnosis not present

## 2022-08-10 DIAGNOSIS — F1120 Opioid dependence, uncomplicated: Secondary | ICD-10-CM | POA: Diagnosis not present

## 2022-08-10 DIAGNOSIS — T8740 Infection of amputation stump, unspecified extremity: Secondary | ICD-10-CM | POA: Diagnosis not present

## 2022-08-10 LAB — RENAL FUNCTION PANEL
Albumin: 3.4 g/dL — ABNORMAL LOW (ref 3.5–5.0)
Anion gap: 9 (ref 5–15)
BUN: 34 mg/dL — ABNORMAL HIGH (ref 8–23)
CO2: 26 mmol/L (ref 22–32)
Calcium: 9.1 mg/dL (ref 8.9–10.3)
Chloride: 103 mmol/L (ref 98–111)
Creatinine, Ser: 1.24 mg/dL (ref 0.61–1.24)
GFR, Estimated: >60 mL/min (ref >60)
Glucose, Bld: 164 mg/dL — ABNORMAL HIGH (ref 70–99)
Phosphorus: 3.2 mg/dL (ref 2.5–4.6)
Potassium: 4.6 mmol/L (ref 3.5–5.1)
Sodium: 138 mmol/L (ref 135–145)

## 2022-08-10 LAB — CBC
HCT: 36.1 % — ABNORMAL LOW (ref 39.0–52.0)
Hemoglobin: 12.1 g/dL — ABNORMAL LOW (ref 13.0–17.0)
MCH: 28.3 pg (ref 26.0–34.0)
MCHC: 33.5 g/dL (ref 30.0–36.0)
MCV: 84.3 fL (ref 80.0–100.0)
Platelets: 233 10*3/uL (ref 150–400)
RBC: 4.28 MIL/uL (ref 4.22–5.81)
RDW: 13.8 % (ref 11.5–15.5)
WBC: 15.5 10*3/uL — ABNORMAL HIGH (ref 4.0–10.5)
nRBC: 0 % (ref 0.0–0.2)

## 2022-08-10 LAB — MAGNESIUM: Magnesium: 2.2 mg/dL (ref 1.7–2.4)

## 2022-08-10 MED ORDER — CEFAZOLIN SODIUM-DEXTROSE 2-4 GM/100ML-% IV SOLN
2.0000 g | Freq: Three times a day (TID) | INTRAVENOUS | Status: DC
  Administered 2022-08-10 – 2022-08-11 (×3): 2 g via INTRAVENOUS
  Filled 2022-08-10 (×3): qty 100

## 2022-08-10 NOTE — Evaluation (Signed)
Occupational Therapy Evaluation Patient Details Name: Jeffery Gross MRN: [id number] DOB: March 18, 1961 Today's Date: 08/10/2022   History of Present Illness Pt is 62 yo male who presents on 08/06/22 with abscess s/p R AKA revision 07/04/22 and oral antibiotic course. S/p R AKA revision and wound vac placement 1/20. PMH: B AKA 01/09/16, HTN, GERD, back pain, depression, DVT, smoker   Clinical Impression   PTA, pt was independent in ADL and IADL. Upon eval, pt presetns with R residual limb pain affecting functional mobility. Pt donning underpants sitting in bed with min guard A for sitting balance with lateral leans. Pt performing lateral scoots along HOB and demonstrating ability to scoot anterior/posterior to simulate transfer to chair. Anticipate pt will progress well with therapies. Will continue to follow acutely to maximize his return to baseline.     Recommendations for follow up therapy are one component of a multi-disciplinary discharge planning process, led by the attending physician.  Recommendations may be updated based on patient status, additional functional criteria and insurance authorization.   Follow Up Recommendations  No OT follow up     Assistance Recommended at Discharge Intermittent Supervision/Assistance  Patient can return home with the following A little help with walking and/or transfers;Help with stairs or ramp for entrance    Functional Status Assessment  Patient has had a recent decline in their functional status and demonstrates the ability to make significant improvements in function in a reasonable and predictable amount of time.  Equipment Recommendations       Recommendations for Other Services       Precautions / Restrictions Precautions Precautions: Fall;Other (comment) Precaution Comments: R leg wound vac; bil AKA 2017 (prostheses don't fit) Restrictions Weight Bearing Restrictions: Yes RLE Weight Bearing: Non weight bearing      Mobility Bed  Mobility Overal bed mobility: Needs Assistance             General bed mobility comments: Pt able to transition supine <> sit EOB with HOB elevated without assistance, extra time due to pain though    Transfers Overall transfer level: Needs assistance Equipment used: None           Anterior-Posterior transfers: Min guard  Lateral/Scoot Transfers: Min guard General transfer comment: Pt scooting laterally along EOB with min guard for safety, extra time due to pain. Declined attempt to transfer to w/c due to pain this date, but did demo ability to scoot forward/backward as well      Balance Overall balance assessment: Needs assistance Sitting-balance support: No upper extremity supported, Feet supported Sitting balance-Leahy Scale: Good         Standing balance comment: N/A                           ADL either performed or assessed with clinical judgement   ADL Overall ADL's : Needs assistance/impaired Eating/Feeding: Independent   Grooming: Independent   Upper Body Bathing: Independent   Lower Body Bathing: Min guard   Upper Body Dressing : Independent   Lower Body Dressing: Min guard   Toilet Transfer: Min guard   Toileting- Clothing Manipulation and Hygiene: Min guard       Functional mobility during ADLs: Min guard (lateral scooting EOB) General ADL Comments: lateral scooting (anterior posterior to chair)     Vision Baseline Vision/History: 0 No visual deficits;1 Wears glasses Ability to See in Adequate Light: 0 Adequate Patient Visual Report: No change from baseline Vision Assessment?: No  apparent visual deficits     Perception Perception Perception Tested?: No   Praxis Praxis Praxis tested?: Not tested    Pertinent Vitals/Pain Pain Assessment Pain Assessment: Faces Faces Pain Scale: Hurts even more Pain Location: R residual limb Pain Descriptors / Indicators: Discomfort, Grimacing, Operative site guarding Pain  Intervention(s): Limited activity within patient's tolerance, Monitored during session     Hand Dominance Right   Extremity/Trunk Assessment Upper Extremity Assessment Upper Extremity Assessment: Overall WFL for tasks assessed   Lower Extremity Assessment Lower Extremity Assessment: Defer to PT evaluation   Cervical / Trunk Assessment Cervical / Trunk Assessment: Normal   Communication Communication Communication: No difficulties   Cognition Arousal/Alertness: Awake/alert Behavior During Therapy: WFL for tasks assessed/performed Overall Cognitive Status: Within Functional Limits for tasks assessed                                 General Comments: Jokes around often, very pleasant     General Comments       Exercises     Shoulder Instructions      Home Living Family/patient expects to be discharged to:: Private residence Living Arrangements: Parent;Other (Comment) (sometimes friend's house, and someetimes, paretn's house) Available Help at Discharge: Family;Friend(s) Type of Home: House Home Access: Bluejacket: One level     Bathroom Shower/Tub: Teacher, early years/pre: Handicapped height Bathroom Accessibility: Yes   Home Equipment: Conservation officer, nature (2 wheels);Wheelchair - manual;BSC/3in1   Additional Comments: Info above is for parents' house, but sometimes he lives with a friend who lives in a 1-level house with stairs to enter and a walk-in shower.      Prior Functioning/Environment Prior Level of Function : Independent/Modified Independent             Mobility Comments: independent with use of Bil prostheses but they don't fit currently, is trying to get to his prosthetist; currently using his w/c for mod I mobility ADLs Comments: Mod I, including mowing in his w/c        OT Problem List: Decreased strength;Decreased activity tolerance;Impaired balance (sitting and/or standing)      OT  Treatment/Interventions: Self-care/ADL training;Therapeutic exercise;DME and/or AE instruction;Therapeutic activities;Balance training;Patient/family education    OT Goals(Current goals can be found in the care plan section) Acute Rehab OT Goals Patient Stated Goal: get out of hospital OT Goal Formulation: With patient Time For Goal Achievement: 08/24/22 Potential to Achieve Goals: Good  OT Frequency: Min 2X/week    Co-evaluation              AM-PAC OT "6 Clicks" Daily Activity     Outcome Measure Help from another person eating meals?: None Help from another person taking care of personal grooming?: None Help from another person toileting, which includes using toliet, bedpan, or urinal?: A Little Help from another person bathing (including washing, rinsing, drying)?: A Little Help from another person to put on and taking off regular upper body clothing?: None Help from another person to put on and taking off regular lower body clothing?: A Little 6 Click Score: 21   End of Session Nurse Communication: Mobility status  Activity Tolerance: Patient tolerated treatment well Patient left: in bed;with call bell/phone within reach;with bed alarm set  OT Visit Diagnosis: Muscle weakness (generalized) (M62.81);Pain Pain - Right/Left: Right Pain - part of body:  (residual limb)  Time: 1240-1313 OT Time Calculation (min): 33 min Charges:  OT General Charges $OT Visit: 1 Visit OT Evaluation $OT Eval Moderate Complexity: 1 Mod OT Treatments $Self Care/Home Management : 8-22 mins  Tyler Deis, OTR/L [facility] Acute Rehabilitation Office: [phone]    Myrla Halsted 08/10/2022, 4:08 PM

## 2022-08-10 NOTE — Progress Notes (Signed)
PROGRESS NOTE  Jeffery Gross RPR:[id number] DOB: 1960-07-23   PCP: Salli Real, MD  Patient is from: Home  DOA: 08/06/2022 LOS: 4  Chief complaints Chief Complaint  Patient presents with   Post-op Problem     Brief Narrative / Interim history: 62 year old M with PMH of bilateral AKA, polysubstance use including cocaine, tobacco and alcohol, chronic back pain on opiate and GERD presented to the pain hospital with pain, erythema and purulent drainage from right AKA stump after he completed oral antibiotic course outpatient.  He is followed by Dr. Lajoyce Corners.  He had no leukocytosis.  ESR and CRP within normal.  Started on ceftriaxone and vancomycin and transferred to [facility] for evaluation by Dr. Lajoyce Corners per recommendation by Dr. Steward Drone.  UDS positive for cocaine.  Antibiotics held on 1/18 until evaluation by orthopedic surgery to obtain wound culture.  He had revision of right AKA stump with wound VAC placement on 1/20.  Per op note,  no purulent abscess but there was ischemic changes medial aspect of the thigh that was sent for culture. Antibiotic resumed with IV cefepime, Flagyl and vancomycin and de-escalated to IV Ancef after negative wound culture.  Subjective: Seen and examined earlier this morning.  No major events overnight of this morning.  No complaints.  Asking about vitamin D.  Reports taking vitamin D 50,000 international units weekly on Fridays for the last 6 months.  Objective: Vitals:   08/09/22 1704 08/09/22 1904 08/10/22 0607 08/10/22 0809  BP: 118/70 123/63 (!) 133/91 130/65  Pulse: 77 83 81 79  Resp: 18  16 18   Temp: 98.1 F (36.7 C) 98.1 F (36.7 C) 97.7 F (36.5 C) 98.3 F (36.8 C)  TempSrc: Oral Oral Oral Oral  SpO2: 96% 98% 99% 96%  Weight:      Height:        Examination:  GENERAL: No apparent distress.  Nontoxic. HEENT: MMM.  Vision and hearing grossly intact.  NECK: Supple.  No apparent JVD.  RESP:  No IWOB.  Fair aeration bilaterally. CVS:  RRR. Heart  sounds normal.  ABD/GI/GU: BS+. Abd soft, NTND.  MSK/EXT: Bilateral AKA.  Wound VAC to right AKA. SKIN: no apparent skin lesion or wound NEURO: Awake and alert. Oriented appropriately.  No apparent focal neuro deficit. PSYCH: Calm. Normal affect.   Procedures:  1/20-right AKA revision  Microbiology summarized: 1/20-surgical wound culture NGTD  Assessment and plan: Principal Problem:   Amputation stump infection (HCC) Active Problems:   Chronic pain syndrome   Opioid dependence (HCC)   GERD (gastroesophageal reflux disease)   Cocaine use   Tobacco use disorder  Persistent/recalcitrant right stump infection: Presents with pain, redness and purulent drainage from right stump.  Some purulent drainage on exam.  No leukocytosis.  CRP and ESR within normal.  No fever or chills.  Has history of MRSA and 2 recent hospitalizations.  Surgical wound culture NGTD. -S/p right AKA revision by Dr. 10-17-2000 1/20. -Wound VAC for 1 week per Dr. 2/20. -De-escalate antibiotics to IV Ancef -Encouraged smoking cessation.   Polysubstance use including cocaine, alcohol and tobacco:  -Encouraged to refrain from cocaine and alcohol use. -Encouraged smoking cessation -Nicotine patch 21 mg daily ordered   Chronic pain/opioid dependence -Continue home cymbalta, gabapentin and oxycodone  Bilateral AKA: Uses wheelchair at baseline.  Able to transfer. -Therapy recommended follow-up with prosthetist   GERD  -protonix ordered for GI protection   History of vitamin D deficiency: -Check vitamin D level before resuming home  vitamin D  Increased nutrient needs: Body mass index is 47.29 kg/m. Nutrition Problem: Increased nutrient needs Etiology: wound healing Signs/Symptoms: estimated needs Interventions: Ensure Enlive (each supplement provides 350kcal and 20 grams of protein), MVI, Juven   DVT prophylaxis:  heparin injection 5,000 Units Start: 08/06/22 1400  Code Status: Full code Family  Communication: None at bedside Level of care: Med-Surg Status is: Inpatient Remains inpatient appropriate because: Due to right AKA stump infection   Final disposition: Home in the next 24 to 48 hours. Consultants:  Orthopedic surgery  35 minutes with more than 50% spent in reviewing records, counseling patient/family and coordinating care.   Sch Meds:  Scheduled Meds:  vitamin C  1,000 mg Oral Daily   Chlorhexidine Gluconate Cloth  6 each Topical Daily   docusate sodium  100 mg Oral Daily   DULoxetine  60 mg Oral q AM   gabapentin  300 mg Oral TID   heparin  5,000 Units Subcutaneous Q8H   multivitamin with minerals  1 tablet Oral Daily   nicotine  21 mg Transdermal Daily   nutrition supplement (JUVEN)  1 packet Oral BID BM   oxyCODONE  20 mg Oral 5 X Daily   pantoprazole  40 mg Oral Daily   polyethylene glycol  17 g Oral BID   Ensure Max Protein  11 oz Oral BID   senna  1 tablet Oral QHS   zinc sulfate  220 mg Oral Daily   Continuous Infusions:  sodium chloride 75 mL/hr at 08/10/22 0616   ceFEPime (MAXIPIME) IV 2 g (08/10/22 0122)   magnesium sulfate bolus IVPB     metronidazole 500 mg (08/10/22 1118)   vancomycin 750 mg (08/10/22 0016)   PRN Meds:.acetaminophen **OR** acetaminophen, alum & mag hydroxide-simeth, bisacodyl, guaiFENesin-dextromethorphan, hydrALAZINE, HYDROmorphone (DILAUDID) injection, labetalol, magnesium citrate, magnesium sulfate bolus IVPB, metoprolol tartrate, naLOXone (NARCAN)  injection, ondansetron **OR** [DISCONTINUED] ondansetron (ZOFRAN) IV, oxyCODONE, oxyCODONE, phenol, polyethylene glycol, potassium chloride, traZODone  Antimicrobials: Anti-infectives (From admission, onward)    Start     Dose/Rate Route Frequency Ordered Stop   08/10/22 0100  vancomycin (VANCOREADY) IVPB 750 mg/150 mL        750 mg 150 mL/hr over 60 Minutes Intravenous Every 12 hours 08/09/22 1232     08/09/22 1330  vancomycin (VANCOREADY) IVPB 1500 mg/300 mL        1,500  mg 150 mL/hr over 120 Minutes Intravenous  Once 08/09/22 1232 08/09/22 1749   08/09/22 1330  ceFEPIme (MAXIPIME) 2 g in sodium chloride 0.9 % 100 mL IVPB        2 g 200 mL/hr over 30 Minutes Intravenous Every 12 hours 08/09/22 1232     08/09/22 1100  metroNIDAZOLE (FLAGYL) IVPB 500 mg        500 mg 100 mL/hr over 60 Minutes Intravenous Every 12 hours 08/09/22 1011     08/09/22 1030  ceFAZolin (ANCEF) IVPB 2g/100 mL premix  Status:  Discontinued        2 g 200 mL/hr over 30 Minutes Intravenous Every 8 hours 08/09/22 0943 08/09/22 1055   08/07/22 0800  cefTRIAXone (ROCEPHIN) 2 g in sodium chloride 0.9 % 100 mL IVPB  Status:  Discontinued        2 g 200 mL/hr over 30 Minutes Intravenous Every 24 hours 08/06/22 1131 08/07/22 1313   08/06/22 2200  vancomycin (VANCOREADY) IVPB 500 mg/100 mL  Status:  Discontinued        500 mg 100 mL/hr over  60 Minutes Intravenous Every 24 hours 08/06/22 0646 08/07/22 1313   08/06/22 1400  metroNIDAZOLE (FLAGYL) tablet 500 mg  Status:  Discontinued        500 mg Oral Every 12 hours 08/06/22 1131 08/07/22 1313   08/06/22 0530  cefTRIAXone (ROCEPHIN) 2 g in sodium chloride 0.9 % 100 mL IVPB        2 g 200 mL/hr over 30 Minutes Intravenous  Once 08/06/22 0526 08/06/22 0917   08/06/22 0530  vancomycin (VANCOREADY) IVPB 1500 mg/300 mL        1,500 mg 150 mL/hr over 120 Minutes Intravenous  Once 08/06/22 0529 08/06/22 1019        I have personally reviewed the following labs and images: CBC: Recent Labs  Lab 08/06/22 0254 08/06/22 1156 08/10/22 0459  WBC 8.7 10.3 15.5*  HGB 14.5 13.5 12.1*  HCT 44.9 41.4 36.1*  MCV 85.5 85.5 84.3  PLT 248 229 233   BMP &GFR Recent Labs  Lab 08/06/22 0254 08/06/22 1156 08/07/22 0333 08/10/22 0459  NA 139  --  140 138  K 3.4*  --  4.3 4.6  CL 101  --  106 103  CO2 29  --  27 26  GLUCOSE 128*  --  110* 164*  BUN 22  --  18 34*  CREATININE 1.10 1.06 1.10 1.24  CALCIUM 9.4  --  8.6* 9.1  MG  --   --  2.1 2.2   PHOS  --   --   --  3.2   Estimated Creatinine Clearance: 36.8 mL/min (by C-G formula based on SCr of 1.24 mg/dL). Liver & Pancreas: Recent Labs  Lab 08/06/22 0254 08/07/22 0333 08/10/22 0459  AST 18 13*  --   ALT 12 10  --   ALKPHOS 101 72  --   BILITOT 0.3 0.1*  --   PROT 6.9 5.6*  --   ALBUMIN 3.9 3.1* 3.4*   No results for input(s): "LIPASE", "AMYLASE" in the last 168 hours. No results for input(s): "AMMONIA" in the last 168 hours. Diabetic: No results for input(s): "HGBA1C" in the last 72 hours. No results for input(s): "GLUCAP" in the last 168 hours. Cardiac Enzymes: No results for input(s): "CKTOTAL", "CKMB", "CKMBINDEX", "TROPONINI" in the last 168 hours. No results for input(s): "PROBNP" in the last 8760 hours. Coagulation Profile: No results for input(s): "INR", "PROTIME" in the last 168 hours. Thyroid Function Tests: No results for input(s): "TSH", "T4TOTAL", "FREET4", "T3FREE", "THYROIDAB" in the last 72 hours. Lipid Profile: No results for input(s): "CHOL", "HDL", "LDLCALC", "TRIG", "CHOLHDL", "LDLDIRECT" in the last 72 hours. Anemia Panel: No results for input(s): "VITAMINB12", "FOLATE", "FERRITIN", "TIBC", "IRON", "RETICCTPCT" in the last 72 hours. Urine analysis:    Component Value Date/Time   COLORURINE YELLOW 01/24/2016 0754   APPEARANCEUR Clear 04/24/2020 1327   LABSPEC 1.015 01/24/2016 0754   PHURINE 5.5 01/24/2016 0754   GLUCOSEU Negative 04/24/2020 1327   HGBUR NEGATIVE 01/24/2016 0754   BILIRUBINUR Negative 04/24/2020 [place] 01/24/2016 0754   PROTEINUR Negative 04/24/2020 [place] 01/24/2016 0754   NITRITE Negative 04/24/2020 1327   NITRITE NEGATIVE 01/24/2016 0754   LEUKOCYTESUR Negative 04/24/2020 1327   Sepsis Labs: Invalid input(s): "PROCALCITONIN", "LACTICIDVEN"  Microbiology: Recent Results (from the past 240 hour(s))  Aerobic/Anaerobic Culture w Gram Stain (surgical/deep wound)     Status: None  (Preliminary result)   Collection Time: 08/09/22  8:24 AM   Specimen: PATH Soft tissue  Result Value Ref Range Status   Specimen Description TISSUE  Final   Special Requests RIGHT LEG ABSC  Final   Gram Stain   Final    RARE WBC PRESENT, PREDOMINANTLY PMN NO ORGANISMS SEEN    Culture   Final    NO GROWTH 1 DAY Performed at [facility], [address] [place], [place] [address]    Report Status PENDING  Incomplete    Radiology Studies: No results found.    Jeffery Gross T. Merick Kelleher [facility]  If 7PM-7AM, please contact night-coverage [email or web address] 08/10/2022, 1:13 PM

## 2022-08-11 ENCOUNTER — Encounter: Payer: Self-pay | Admitting: Orthopedic Surgery

## 2022-08-11 DIAGNOSIS — F1120 Opioid dependence, uncomplicated: Secondary | ICD-10-CM | POA: Diagnosis not present

## 2022-08-11 DIAGNOSIS — Z89611 Acquired absence of right leg above knee: Secondary | ICD-10-CM

## 2022-08-11 DIAGNOSIS — Z89612 Acquired absence of left leg above knee: Secondary | ICD-10-CM

## 2022-08-11 DIAGNOSIS — T8740 Infection of amputation stump, unspecified extremity: Secondary | ICD-10-CM | POA: Diagnosis not present

## 2022-08-11 DIAGNOSIS — F1490 Cocaine use, unspecified, uncomplicated: Secondary | ICD-10-CM | POA: Diagnosis not present

## 2022-08-11 DIAGNOSIS — F17200 Nicotine dependence, unspecified, uncomplicated: Secondary | ICD-10-CM | POA: Diagnosis not present

## 2022-08-11 LAB — CBC
HCT: 35.3 % — ABNORMAL LOW (ref 39.0–52.0)
Hemoglobin: 11.6 g/dL — ABNORMAL LOW (ref 13.0–17.0)
MCH: 28 pg (ref 26.0–34.0)
MCHC: 32.9 g/dL (ref 30.0–36.0)
MCV: 85.1 fL (ref 80.0–100.0)
Platelets: 197 10*3/uL (ref 150–400)
RBC: 4.15 MIL/uL — ABNORMAL LOW (ref 4.22–5.81)
RDW: 14.4 % (ref 11.5–15.5)
WBC: 11.1 10*3/uL — ABNORMAL HIGH (ref 4.0–10.5)
nRBC: 0 % (ref 0.0–0.2)

## 2022-08-11 MED ORDER — CEPHALEXIN 500 MG PO CAPS: 500.0000 mg | ORAL_CAPSULE | Freq: Four times a day (QID) | ORAL | 0 refills | Status: AC

## 2022-08-11 MED ORDER — ASCORBIC ACID 1000 MG PO TABS: 1000.0000 mg | ORAL_TABLET | Freq: Every day | ORAL | 0 refills | Status: AC

## 2022-08-11 MED ORDER — ZINC SULFATE 220 (50 ZN) MG PO CAPS: 220.0000 mg | ORAL_CAPSULE | Freq: Every day | ORAL | 0 refills | Status: AC

## 2022-08-11 MED ORDER — SENNOSIDES-DOCUSATE SODIUM 8.6-50 MG PO TABS: 1.0000 | ORAL_TABLET | Freq: Two times a day (BID) | ORAL | 0 refills | Status: DC | PRN

## 2022-08-11 NOTE — Discharge Summary (Signed)
Physician Discharge Summary  Jeffery Gross OXB:[id number] DOB: Dec 25, 1960 DOA: 08/06/2022  PCP: Sandi Mariscal, MD  Admit date: 08/06/2022 Discharge date: 08/11/2022 Admitted From: Home Disposition: Home Recommendations for Outpatient Follow-up:  Follow up with PCP and orthopedic surgery as below Check 1, CMP and CBC at follow-up Please follow up on the following pending results: None  Home Health: None Equipment/Devices: Patient has appropriate DME  Discharge Condition: Stable CODE STATUS: Full code  Follow-up Information     Newt Minion, MD Follow up in 1 week(s).   Specialty: Orthopedic Surgery Contact information: [place] [address] [phone]         Sandi Mariscal, MD. Schedule an appointment as soon as possible for a visit in 1 week(s).   Specialty: Internal Medicine Contact information: [place] [address] [phone]                 Hospital course 62 year old M with PMH of bilateral AKA, polysubstance use including cocaine, tobacco and alcohol, chronic back pain on opiate and GERD presented to the pain hospital with pain, erythema and purulent drainage from right AKA stump after he completed oral antibiotic course outpatient.  He is followed by Dr. Sharol Given.  He had no leukocytosis.  ESR and CRP within normal.  Started on ceftriaxone and vancomycin and transferred to [facility] for evaluation by Dr. Sharol Given per recommendation by Dr. Sammuel Hines.   UDS positive for cocaine.  Antibiotics held on 1/18 until evaluation by orthopedic surgery to obtain wound culture.  He had revision of right AKA stump with wound VAC placement on 1/20.  Per op note,  no purulent abscess but there was ischemic changes medial aspect of the thigh that was sent for culture. Antibiotic resumed with IV cefepime, Flagyl and vancomycin and de-escalated to IV Ancef after negative wound culture.  He is discharged on p.o. Keflex.  He is cleared for discharge by  orthopedic surgery.  Wound VAC discontinued.  Wound care and instruction provided by orthopedic surgery.   See individual problem list below for more.   Problems addressed during this hospitalization Principal Problem:   Amputation stump infection ([place]) Active Problems:   Chronic pain syndrome   Opioid dependence (HCC)   GERD (gastroesophageal reflux disease)   Cocaine use   Tobacco use disorder   Persistent/recalcitrant right stump infection: Presents with pain, redness and purulent drainage from right stump.  Some purulent drainage on exam.  No leukocytosis.  CRP and ESR within normal.  No fever or chills.  Has history of MRSA and 2 recent hospitalizations.  Surgical wound culture NGTD. -S/p right AKA revision by Dr. Sharol Given 1/20.  Per op note, no signs of infection. -De-escalate antibiotics to IV Ancef and discharged on p.o. Keflex for 10 more days -Encouraged smoking cessation. -Outpatient follow-up with orthopedic surgery in 1 week. -Wound care instruction provided by orthopedic surgery   Polysubstance use including cocaine, alcohol and tobacco:  -Encouraged to refrain from cocaine and alcohol use. -Encouraged smoking cessation   Chronic pain/opioid dependence -Continue home cymbalta, gabapentin and oxycodone   Bilateral AKA: Uses wheelchair at baseline.  Able to transfer. -Therapy recommended follow-up with prosthetist   GERD  -protonix ordered for GI protection    History of vitamin D deficiency: -Check vitamin D level before resuming home vitamin D   Increased nutrient needs: Nutrition Problem: Increased nutrient needs Etiology: wound healing Signs/Symptoms: estimated needs Interventions: Ensure Enlive (each supplement provides 350kcal and 20 grams of  protein), MVI, Juven  Vital signs Vitals:   08/10/22 1623 08/10/22 1945 08/11/22 0450 08/11/22 0818  BP: 127/66 123/85 104/69 (!) 94/53  Pulse: 83 74 71 82  Temp: 98.3 F (36.8 C) 98 F (36.7 C) 97.7 F (36.5 C)  98.7 F (37.1 C)  Resp: 18 17 18 17   Height:      Weight:      SpO2: 96% 97% 100% 92%  TempSrc: Oral Oral Oral Oral  BMI (Calculated):         Discharge exam  GENERAL: No apparent distress.  Nontoxic. HEENT: MMM.  Vision and hearing grossly intact.  NECK: Supple.  No apparent JVD.  RESP:  No IWOB.  Fair aeration bilaterally. CVS:  RRR. Heart sounds normal.  ABD/GI/GU: BS+. Abd soft, NTND.  MSK/EXT: Bilateral AKA.  Wound VAC over right AKA. SKIN: no apparent skin lesion or wound NEURO: Awake and alert. Oriented appropriately.  No apparent focal neuro deficit. PSYCH: Calm. Normal affect.   Discharge Instructions Discharge Instructions     Call MD for:  difficulty breathing, headache or visual disturbances   Complete by: As directed    Call MD for:  extreme fatigue   Complete by: As directed    Call MD for:  persistant dizziness or light-headedness   Complete by: As directed    Call MD for:  temperature >100.4   Complete by: As directed    Diet general   Complete by: As directed    Discharge wound care:   Complete by: As directed    Wound care per recommendation by orthopedic surgery   Increase activity slowly   Complete by: As directed    Negative Pressure Wound Therapy - Incisional   Complete by: As directed       Allergies as of 08/11/2022       Reactions   Sulfacetamide Sodium Swelling   Edema         Medication List     TAKE these medications    ascorbic acid 1000 MG tablet Commonly known as: VITAMIN C Take 1 tablet (1,000 mg total) by mouth daily.   cephALEXin 500 MG capsule Commonly known as: KEFLEX Take 1 capsule (500 mg total) by mouth 4 (four) times daily for 10 days.   DULoxetine 60 MG capsule Commonly known as: CYMBALTA Take 60 mg by mouth in the morning.   gabapentin 300 MG capsule Commonly known as: NEURONTIN Take 300 mg by mouth 3 (three) times daily.   omeprazole 20 MG capsule Commonly known as: PRILOSEC Take 20 mg by mouth in  the morning.   Oxycodone HCl 20 MG Tabs Take 1 tablet (20 mg total) by mouth 5 (five) times daily.   senna-docusate 8.6-50 MG tablet Commonly known as: Senokot-S Take 1 tablet by mouth 2 (two) times daily between meals as needed for mild constipation.   Vitamin D (Ergocalciferol) 1.25 MG (50000 UNIT) Caps capsule Commonly known as: DRISDOL Take 50,000 Units by mouth every Friday.   zinc sulfate 220 (50 Zn) MG capsule Take 1 capsule (220 mg total) by mouth daily.               Discharge Care Instructions  (From admission, onward)           Start     Ordered   08/11/22 0000  Discharge wound care:       Comments: Wound care per recommendation by orthopedic surgery   08/11/22 [phone]  Consultations: Orthopedic surgery  Procedures/Studies: 1/20-right AKA revision   CT EXTREMITY LOWER RIGHT W CONTRAST  Result Date: 07/12/2022 CLINICAL DATA:  Right knee amputation revision 4 days ago. New pain, swelling, and warmth at the incision site. Concern for infection. EXAM: CT OF THE LOWER RIGHT EXTREMITY WITH CONTRAST TECHNIQUE: Multidetector CT imaging of the lower right extremity was performed according to the standard protocol following intravenous contrast administration. RADIATION DOSE REDUCTION: This exam was performed according to the departmental dose-optimization program which includes automated exposure control, adjustment of the mA and/or kV according to patient size and/or use of iterative reconstruction technique. CONTRAST:  53mL OMNIPAQUE IOHEXOL 300 MG/ML  SOLN COMPARISON:  Radiographs 06/13/2022 FINDINGS: Bones/Joint/Cartilage Recent postoperative change of right femur above the knee amputation revision. No evidence of acute fracture or osteomyelitis. Degenerative arthritis right hip. Partially visualized lumbosacral fusion. Ligaments Suboptimally assessed by CT. Muscles and Tendons Low to intermediate density peripherally enhancing bilobed fluid collection  about both sides of the distal femoral stump within the anterior compartment musculature. This measures approximately 9.2 x 3.0 x 5.7 cm and demonstrates peripheral enhancement. Fat density lesion within the proximal rectus femoris likely a benign lipoma. Soft tissues Soft tissue swelling about the distal right thigh at the amputation site greatest laterally where there is an additional small 1.8 cm loosely organized fluid collection (series 5/image 222). No soft tissue gas. IMPRESSION: Peripherally enhancing low-intermediate density fluid collection about the distal femoral stump within the anterior compartment musculature. This is nonspecific given recent postoperative change and could be a postoperative hematoma/seroma. Superimposed infection/abscess is not excluded. No evidence of osteomyelitis. Electronically Signed   By: Minerva Fester M.D.   On: 07/12/2022 21:23       The results of significant diagnostics from this hospitalization (including imaging, microbiology, ancillary and laboratory) are listed below for reference.     Microbiology: Recent Results (from the past 240 hour(s))  Aerobic/Anaerobic Culture w Gram Stain (surgical/deep wound)     Status: None (Preliminary result)   Collection Time: 08/09/22  8:24 AM   Specimen: PATH Soft tissue  Result Value Ref Range Status   Specimen Description TISSUE  Final   Special Requests RIGHT LEG ABSC  Final   Gram Stain   Final    RARE WBC PRESENT, PREDOMINANTLY PMN NO ORGANISMS SEEN    Culture   Final    NO GROWTH 2 DAYS Performed at [facility], [address] [place], [place] [address]    Report Status PENDING  Incomplete     Labs:  CBC: Recent Labs  Lab 08/06/22 0254 08/06/22 1156 08/10/22 0459 08/11/22 0433  WBC 8.7 10.3 15.5* 11.1*  HGB 14.5 13.5 12.1* 11.6*  HCT 44.9 41.4 36.1* 35.3*  MCV 85.5 85.5 84.3 85.1  PLT 248 229 233 197   BMP &GFR Recent Labs  Lab 08/06/22 0254 08/06/22 1156 08/07/22 0333  08/10/22 0459  NA 139  --  140 138  K 3.4*  --  4.3 4.6  CL 101  --  106 103  CO2 29  --  27 26  GLUCOSE 128*  --  110* 164*  BUN 22  --  18 34*  CREATININE 1.10 1.06 1.10 1.24  CALCIUM 9.4  --  8.6* 9.1  MG  --   --  2.1 2.2  PHOS  --   --   --  3.2   Estimated Creatinine Clearance: 36.8 mL/min (by C-G formula based on SCr of 1.24 mg/dL). Liver &  Pancreas: Recent Labs  Lab 08/06/22 0254 08/07/22 0333 08/10/22 0459  AST 18 13*  --   ALT 12 10  --   ALKPHOS 101 72  --   BILITOT 0.3 0.1*  --   PROT 6.9 5.6*  --   ALBUMIN 3.9 3.1* 3.4*   No results for input(s): "LIPASE", "AMYLASE" in the last 168 hours. No results for input(s): "AMMONIA" in the last 168 hours. Diabetic: No results for input(s): "HGBA1C" in the last 72 hours. No results for input(s): "GLUCAP" in the last 168 hours. Cardiac Enzymes: No results for input(s): "CKTOTAL", "CKMB", "CKMBINDEX", "TROPONINI" in the last 168 hours. No results for input(s): "PROBNP" in the last 8760 hours. Coagulation Profile: No results for input(s): "INR", "PROTIME" in the last 168 hours. Thyroid Function Tests: No results for input(s): "TSH", "T4TOTAL", "FREET4", "T3FREE", "THYROIDAB" in the last 72 hours. Lipid Profile: No results for input(s): "CHOL", "HDL", "LDLCALC", "TRIG", "CHOLHDL", "LDLDIRECT" in the last 72 hours. Anemia Panel: No results for input(s): "VITAMINB12", "FOLATE", "FERRITIN", "TIBC", "IRON", "RETICCTPCT" in the last 72 hours. Urine analysis:    Component Value Date/Time   COLORURINE YELLOW 01/24/2016 0754   APPEARANCEUR Clear 04/24/2020 1327   LABSPEC 1.015 01/24/2016 0754   PHURINE 5.5 01/24/2016 0754   GLUCOSEU Negative 04/24/2020 1327   HGBUR NEGATIVE 01/24/2016 0754   BILIRUBINUR Negative 04/24/2020 [place] 01/24/2016 0754   PROTEINUR Negative 04/24/2020 [place] 01/24/2016 0754   NITRITE Negative 04/24/2020 1327   NITRITE NEGATIVE 01/24/2016 0754    LEUKOCYTESUR Negative 04/24/2020 1327   Sepsis Labs: Invalid input(s): "PROCALCITONIN", "LACTICIDVEN"   SIGNED:  Mercy Riding, MD  [facility] 08/11/2022, 3:00 PM

## 2022-08-11 NOTE — Progress Notes (Signed)
Patient ID: Jeffery Gross, male   DOB: May 30, 1961, 62 y.o.   MRN: [id number] Patient is status post revision above-the-knee amputation.  The cultures are negative to date.  Will have the wound VAC discontinued at time of discharge.  I will follow-up in the office 1 week after discharge.

## 2022-08-11 NOTE — Progress Notes (Signed)
Office Visit Note   Patient: Jeffery Gross           Date of Birth: 08-21-1960           MRN: [id number] Visit Date: 07/31/2022              Requested by: Sandi Mariscal, [place],  [place] [id number] PCP: Sandi Mariscal, MD  Chief Complaint  Patient presents with   Right Leg - Routine Post Op    07/04/22 right AKA revision      HPI: Patient is 3 weeks status post right above-the-knee amputation revision.  Assessment & Plan: Visit Diagnoses:  1. S/P AKA (above knee amputation) bilateral (HCC)     Plan: Continue current wound care he will complete his course of doxycycline.  Follow-Up Instructions: Return in about 4 weeks (around 08/28/2022).   Ortho Exam  Patient is alert, oriented, no adenopathy, well-dressed, normal affect, normal respiratory effort. Examination the incision has some dermatitis from drainage but there is no cellulitis no open wound.  We will harvest the sutures today.  Imaging: No results found.   Labs: Lab Results  Component Value Date   HGBA1C 5.8 (H) 06/14/2022   ESRSEDRATE 6 08/07/2022   ESRSEDRATE 43 (H) 06/14/2022   CRP 0.9 08/07/2022   CRP 4.3 (H) 06/14/2022   REPTSTATUS PENDING 08/09/2022   GRAMSTAIN  08/09/2022    RARE WBC PRESENT, PREDOMINANTLY PMN NO ORGANISMS SEEN    CULT  08/09/2022    NO GROWTH 2 DAYS Performed at [facility], [place] [address] [place], [place] [address]      Lab Results  Component Value Date   ALBUMIN 3.4 (L) 08/10/2022   ALBUMIN 3.1 (L) 08/07/2022   ALBUMIN 3.9 08/06/2022   PREALBUMIN 22 08/07/2022   PREALBUMIN 29 07/04/2022   PREALBUMIN 18 06/14/2022    Lab Results  Component Value Date   MG 2.2 08/10/2022   MG 2.1 08/07/2022   MG 2.0 07/13/2022   Lab Results  Component Value Date   VD25OH 63.57 07/04/2022    Lab Results  Component Value Date   PREALBUMIN 22 08/07/2022   PREALBUMIN 29 07/04/2022   PREALBUMIN 18 06/14/2022      Latest Ref Rng & Units 08/11/2022     4:33 AM 08/10/2022    4:59 AM 08/06/2022   11:56 AM  CBC EXTENDED  WBC 4.0 - 10.5 K/uL 11.1  15.5  10.3   RBC 4.22 - 5.81 MIL/uL 4.15  4.28  4.84   Hemoglobin 13.0 - 17.0 g/dL 11.6  12.1  13.5   HCT 39.0 - 52.0 % 35.3  36.1  41.4   Platelets 150 - 400 K/uL 197  233  229      There is no height or weight on file to calculate BMI.  Orders:  No orders of the defined types were placed in this encounter.  No orders of the defined types were placed in this encounter.    Procedures: No procedures performed  Clinical Data: No additional findings.  ROS:  All other systems negative, except as noted in the HPI. Review of Systems  Objective: Vital Signs: There were no vitals taken for this visit.  Specialty Comments:  No specialty comments available.  PMFS History: Patient Active Problem List   Diagnosis Date Noted   Amputation stump infection ([place]) 08/06/2022   Cocaine use 08/06/2022   Opioid dependence ([place]) 08/06/2022   Tobacco use disorder 07/13/2022   Substance  abuse ([place]) 07/13/2022   Cellulitis 07/12/2022   GERD (gastroesophageal reflux disease) 07/12/2022   AKA stump complication ([place]) 13/02/6577   Wound dehiscence 07/04/2022   Hx of AKA (above knee amputation), right ([place]) 07/04/2022   History of MRSA infection 06/14/2022   Cellulitis of right lower extremity 06/14/2022   Infected wound 06/13/2022   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   Seasonal allergies 02/08/2016   Anemia due to other cause 02/08/2016   Chronic pain syndrome 02/08/2016   Depression 02/08/2016   Post-operative pain    Ischemia of lower extremity 01/23/2016   Hypoalbuminemia due to protein-calorie malnutrition (HCC)    Dysuria    Tobacco abuse    Constipation due to pain medication    Chronic low back pain    Benign essential HTN    Asthma    Hyponatremia    CKD (chronic kidney disease)    Anemia of chronic disease    Transaminitis    Phantom limb syndrome with pain (HCC)    S/P AKA (above  knee amputation) bilateral (HCC)    Acute kidney injury ([place])    Syncope    Pressure ulcer 01/12/2016   Fall    Encounter for central line placement    Urinary retention    AKI (acute kidney injury) ([place])    Traumatic rhabdomyolysis (HCC)    Rhabdomyolysis 01/03/2016   Nontraumatic compartment syndrome of leg 01/03/2016   Chest pain 10/27/2012   HTN (hypertension) 10/27/2012   Back pain 10/27/2012   Past Medical History:  Diagnosis Date   Back pain    Chest pain    Depression    DVT (deep venous thrombosis) (HCC)    GERD (gastroesophageal reflux disease)    History of kidney stones    passed   HTN (hypertension)    no longer has HTN    Family History  Problem Relation Age of Onset   Hypertension Father     Past Surgical History:  Procedure Laterality Date   AMPUTATION Bilateral 01/09/2016   Procedure: Bilateral Above Knee Amputation, Apply Wound VAC;  Surgeon: Newt Minion, MD;  Location: [facility];  Service: Orthopedics;  Laterality: Bilateral;   FASCIOTOMY Bilateral 01/03/2016   Procedure: FASCIOTOMY;  Surgeon: Leandrew Koyanagi, MD;  Location: [facility];  Service: Orthopedics;  Laterality: Bilateral;   I & D EXTREMITY Bilateral 01/05/2016   Procedure: IRRIGATION AND DEBRIDEMENT BILATERAL LOWER EXTREMITIES; WOUND VAC CHANGE;  Surgeon: Leandrew Koyanagi, MD;  Location: [facility];  Service: Orthopedics;  Laterality: Bilateral;   I & D EXTREMITY Left 01/07/2016   Procedure: Irrigation and Debridement of Left Lower Extremity with Application of Wound Vac;  Surgeon: Leandrew Koyanagi, MD;  Location: [facility];  Service: Orthopedics;  Laterality: Left;   STUMP REVISION Right 07/04/2022   Procedure: REVISION RIGHT ABOVE KNEE AMPUTATION;  Surgeon: Newt Minion, MD;  Location: [facility];  Service: Orthopedics;  Laterality: Right;   VASECTOMY     Social History   Occupational History   Occupation: disabled  Tobacco Use   Smoking status: Every Day    Packs/day: 0.25    Years: 5.00    Total pack years: 1.25     Types: Cigarettes   Smokeless tobacco: Current  Vaping Use   Vaping Use: Every day   Substances: Nicotine  Substance and Sexual Activity   Alcohol use: Yes    Comment: rare   Drug use: Not Currently   Sexual activity: Not on file

## 2022-08-12 LAB — MISC LABCORP TEST (SEND OUT): Labcorp test code: 81950

## 2022-08-14 LAB — AEROBIC/ANAEROBIC CULTURE W GRAM STAIN (SURGICAL/DEEP WOUND): Culture: NO GROWTH

## 2022-08-20 ENCOUNTER — Ambulatory Visit (INDEPENDENT_AMBULATORY_CARE_PROVIDER_SITE_OTHER): Payer: Medicaid Other | Admitting: Family

## 2022-08-20 ENCOUNTER — Encounter: Payer: Self-pay | Admitting: Family

## 2022-08-20 DIAGNOSIS — Z89611 Acquired absence of right leg above knee: Secondary | ICD-10-CM

## 2022-08-20 NOTE — Progress Notes (Signed)
Post-Op Visit Note   Patient: Jeffery Gross           Date of Birth: [birth year]           MRN: [id number] Visit Date: 08/20/2022 PCP: Sandi Mariscal, MD  Chief Complaint: No chief complaint on file.   HPI:  HPI The patient is a 62 year old gentleman seen status post right above-knee amputation revision he has completed his course of doxycycline and is doing dry dressing changes daily Ortho Exam On examination of the right above-knee amputation there are 2 areas that have gaped half of the centimeter there is scant serosanguineous drainage no surrounding erythema or warmth  Visit Diagnoses: No diagnosis found.  Plan: Continue daily Dial soap cleansing.  Dry dressings.  Shrinker.  Follow-up in 2 weeks for staple removal.  Follow-Up Instructions: No follow-ups on file.   Imaging: No results found.  Orders:  No orders of the defined types were placed in this encounter.  No orders of the defined types were placed in this encounter.    PMFS History: Patient Active Problem List   Diagnosis Date Noted   Amputation stump infection ([place]) 08/06/2022   Cocaine use 08/06/2022   Opioid dependence ([place]) 08/06/2022   Tobacco use disorder 07/13/2022   Substance abuse ([place]) 07/13/2022   Cellulitis 07/12/2022   GERD (gastroesophageal reflux disease) 07/12/2022   AKA stump complication ([place]) 70/17/7939   Wound dehiscence 07/04/2022   Hx of AKA (above knee amputation), right ([place]) 07/04/2022   History of MRSA infection 06/14/2022   Cellulitis of right lower extremity 06/14/2022   Infected wound 06/13/2022   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   Seasonal allergies 02/08/2016   Anemia due to other cause 02/08/2016   Chronic pain syndrome 02/08/2016   Depression 02/08/2016   Post-operative pain    Ischemia of lower extremity 01/23/2016   Hypoalbuminemia due to protein-calorie malnutrition (HCC)    Dysuria    Constipation due to pain medication    Chronic low back pain    Benign essential HTN     Asthma    Hyponatremia    CKD (chronic kidney disease)    Anemia of chronic disease    Transaminitis    Phantom limb syndrome with pain (HCC)    S/P AKA (above knee amputation) bilateral (HCC)    Acute kidney injury ([place])    Syncope    Pressure ulcer 01/12/2016   Fall    Encounter for central line placement    Urinary retention    AKI (acute kidney injury) ([place])    Traumatic rhabdomyolysis (HCC)    Rhabdomyolysis 01/03/2016   Nontraumatic compartment syndrome of leg 01/03/2016   Chest pain 10/27/2012   HTN (hypertension) 10/27/2012   Back pain 10/27/2012   Past Medical History:  Diagnosis Date   Back pain    Chest pain    Depression    DVT (deep venous thrombosis) (HCC)    GERD (gastroesophageal reflux disease)    History of kidney stones    passed   HTN (hypertension)    no longer has HTN    Family History  Problem Relation Age of Onset   Hypertension Father     Past Surgical History:  Procedure Laterality Date   AMPUTATION Bilateral 01/09/2016   Procedure: Bilateral Above Knee Amputation, Apply Wound VAC;  Surgeon: Newt Minion, MD;  Location: [facility];  Service: Orthopedics;  Laterality: Bilateral;   AMPUTATION Right 08/09/2022   Procedure: RIGHT REVISION AMPUTATION ABOVE KNEE;  Surgeon:  Newt Minion, MD;  Location: [facility];  Service: Orthopedics;  Laterality: Right;   FASCIOTOMY Bilateral 01/03/2016   Procedure: FASCIOTOMY;  Surgeon: Leandrew Koyanagi, MD;  Location: [facility];  Service: Orthopedics;  Laterality: Bilateral;   I & D EXTREMITY Bilateral 01/05/2016   Procedure: IRRIGATION AND DEBRIDEMENT BILATERAL LOWER EXTREMITIES; WOUND VAC CHANGE;  Surgeon: Leandrew Koyanagi, MD;  Location: [facility];  Service: Orthopedics;  Laterality: Bilateral;   I & D EXTREMITY Left 01/07/2016   Procedure: Irrigation and Debridement of Left Lower Extremity with Application of Wound Vac;  Surgeon: Leandrew Koyanagi, MD;  Location: [facility];  Service: Orthopedics;  Laterality: Left;   STUMP REVISION Right  07/04/2022   Procedure: REVISION RIGHT ABOVE KNEE AMPUTATION;  Surgeon: Newt Minion, MD;  Location: [facility];  Service: Orthopedics;  Laterality: Right;   VASECTOMY     Social History   Occupational History   Occupation: disabled  Tobacco Use   Smoking status: Every Day    Packs/day: 0.25    Years: 5.00    Total pack years: 1.25    Types: Cigarettes   Smokeless tobacco: Current  Vaping Use   Vaping Use: Every day   Substances: Nicotine  Substance and Sexual Activity   Alcohol use: Yes    Comment: rare   Drug use: Not Currently   Sexual activity: Not on file

## 2022-08-27 ENCOUNTER — Encounter: Payer: Medicaid Other | Admitting: Family

## 2022-09-03 ENCOUNTER — Encounter: Payer: Medicaid Other | Admitting: Family

## 2022-09-05 ENCOUNTER — Encounter: Payer: Self-pay | Admitting: Family

## 2022-09-05 ENCOUNTER — Ambulatory Visit (INDEPENDENT_AMBULATORY_CARE_PROVIDER_SITE_OTHER): Payer: Medicaid Other | Admitting: Family

## 2022-09-05 DIAGNOSIS — Z89611 Acquired absence of right leg above knee: Secondary | ICD-10-CM

## 2022-09-05 MED ORDER — DOXYCYCLINE HYCLATE 100 MG PO TABS: 100.0000 mg | ORAL_TABLET | Freq: Two times a day (BID) | ORAL | 0 refills | Status: DC

## 2022-09-05 NOTE — Progress Notes (Signed)
Office Visit Note   Patient: Jeffery Gross           Date of Birth: 1961/05/06           MRN: [id number] Visit Date: 09/05/2022              Requested by: Sandi Mariscal, [place],  [place] [address] PCP: Sandi Mariscal, MD  No chief complaint on file.     HPI: Patient is a 62 year old gentleman seen status post right above-knee amputation revision on January 20 he is concerned he has been having increased redness warmth and pain  Assessment & Plan: Visit Diagnoses: No diagnosis found.  Plan: Will place on a course of doxycycline continue daily dose of cleansing dry dressings shrinker follow-up in 2 weeks for wound check.  Pain medication per pain management  Follow-Up Instructions: Return in about 2 weeks (around 09/19/2022).   Ortho Exam  Patient is alert, oriented, no adenopathy, well-dressed, normal affect, normal respiratory effort. On examination of the thank you right residual limb staples are in place the incision is well-healed he does have scant serosanguineous drainage centrally there is erythema and warmth no ascending of the cellulitis.  No purulence  Imaging: No results found. No images are attached to the encounter.  Labs: Lab Results  Component Value Date   HGBA1C 5.8 (H) 06/14/2022   ESRSEDRATE 6 08/07/2022   ESRSEDRATE 43 (H) 06/14/2022   CRP 0.9 08/07/2022   CRP 4.3 (H) 06/14/2022   REPTSTATUS 08/14/2022 FINAL 08/09/2022   GRAMSTAIN  08/09/2022    RARE WBC PRESENT, PREDOMINANTLY PMN NO ORGANISMS SEEN    CULT  08/09/2022    No growth aerobically or anaerobically. Performed at [facility], [place] [address] [place], [place] [address]      Lab Results  Component Value Date   ALBUMIN 3.4 (L) 08/10/2022   ALBUMIN 3.1 (L) 08/07/2022   ALBUMIN 3.9 08/06/2022   PREALBUMIN 22 08/07/2022   PREALBUMIN 29 07/04/2022   PREALBUMIN 18 06/14/2022    Lab Results  Component Value Date   MG 2.2 08/10/2022   MG 2.1 08/07/2022   MG 2.0  07/13/2022   Lab Results  Component Value Date   VD25OH 63.57 07/04/2022    Lab Results  Component Value Date   PREALBUMIN 22 08/07/2022   PREALBUMIN 29 07/04/2022   PREALBUMIN 18 06/14/2022      Latest Ref Rng & Units 08/11/2022    4:33 AM 08/10/2022    4:59 AM 08/06/2022   11:56 AM  CBC EXTENDED  WBC 4.0 - 10.5 K/uL 11.1  15.5  10.3   RBC 4.22 - 5.81 MIL/uL 4.15  4.28  4.84   Hemoglobin 13.0 - 17.0 g/dL 11.6  12.1  13.5   HCT 39.0 - 52.0 % 35.3  36.1  41.4   Platelets 150 - 400 K/uL 197  233  229      There is no height or weight on file to calculate BMI.  Orders:  No orders of the defined types were placed in this encounter.  Meds ordered this encounter  Medications   doxycycline (VIBRA-TABS) 100 MG tablet    Sig: Take 1 tablet (100 mg total) by mouth 2 (two) times daily.    Dispense:  28 tablet    Refill:  0     Procedures: No procedures performed  Clinical Data: No additional findings.  ROS:  All other systems negative, except as noted in the HPI.  Review of Systems  Objective: Vital Signs: There were no vitals taken for this visit.  Specialty Comments:  No specialty comments available.  PMFS History: Patient Active Problem List   Diagnosis Date Noted   Amputation stump infection ([place]) 08/06/2022   Cocaine use 08/06/2022   Opioid dependence ([place]) 08/06/2022   Tobacco use disorder 07/13/2022   Substance abuse ([place]) 07/13/2022   Cellulitis 07/12/2022   GERD (gastroesophageal reflux disease) 07/12/2022   AKA stump complication ([place]) [id number]   Wound dehiscence 07/04/2022   Hx of AKA (above knee amputation), right ([place]) 07/04/2022   History of MRSA infection 06/14/2022   Cellulitis of right lower extremity 06/14/2022   Infected wound 06/13/2022   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   Seasonal allergies 02/08/2016   Anemia due to other cause 02/08/2016   Chronic pain syndrome 02/08/2016   Depression 02/08/2016   Post-operative pain    Ischemia of  lower extremity 01/23/2016   Hypoalbuminemia due to protein-calorie malnutrition (HCC)    Dysuria    Constipation due to pain medication    Chronic low back pain    Benign essential HTN    Asthma    Hyponatremia    CKD (chronic kidney disease)    Anemia of chronic disease    Transaminitis    Phantom limb syndrome with pain (HCC)    S/P AKA (above knee amputation) bilateral (HCC)    Acute kidney injury ([place])    Syncope    Pressure ulcer 01/12/2016   Fall    Encounter for central line placement    Urinary retention    AKI (acute kidney injury) ([place])    Traumatic rhabdomyolysis (HCC)    Rhabdomyolysis 01/03/2016   Nontraumatic compartment syndrome of leg 01/03/2016   Chest pain 10/27/2012   HTN (hypertension) 10/27/2012   Back pain 10/27/2012   Past Medical History:  Diagnosis Date   Back pain    Chest pain    Depression    DVT (deep venous thrombosis) (HCC)    GERD (gastroesophageal reflux disease)    History of kidney stones    passed   HTN (hypertension)    no longer has HTN    Family History  Problem Relation Age of Onset   Hypertension Father     Past Surgical History:  Procedure Laterality Date   AMPUTATION Bilateral 01/09/2016   Procedure: Bilateral Above Knee Amputation, Apply Wound VAC;  Surgeon: Newt Minion, MD;  Location: [facility];  Service: Orthopedics;  Laterality: Bilateral;   AMPUTATION Right 08/09/2022   Procedure: RIGHT REVISION AMPUTATION ABOVE KNEE;  Surgeon: Newt Minion, MD;  Location: [facility];  Service: Orthopedics;  Laterality: Right;   FASCIOTOMY Bilateral 01/03/2016   Procedure: FASCIOTOMY;  Surgeon: Leandrew Koyanagi, MD;  Location: [facility];  Service: Orthopedics;  Laterality: Bilateral;   I & D EXTREMITY Bilateral 01/05/2016   Procedure: IRRIGATION AND DEBRIDEMENT BILATERAL LOWER EXTREMITIES; WOUND VAC CHANGE;  Surgeon: Leandrew Koyanagi, MD;  Location: [facility];  Service: Orthopedics;  Laterality: Bilateral;   I & D EXTREMITY Left 01/07/2016   Procedure:  Irrigation and Debridement of Left Lower Extremity with Application of Wound Vac;  Surgeon: Leandrew Koyanagi, MD;  Location: [facility];  Service: Orthopedics;  Laterality: Left;   STUMP REVISION Right 07/04/2022   Procedure: REVISION RIGHT ABOVE KNEE AMPUTATION;  Surgeon: Newt Minion, MD;  Location: [facility];  Service: Orthopedics;  Laterality: Right;   VASECTOMY     Social History   Occupational  History   Occupation: disabled  Tobacco Use   Smoking status: Every Day    Packs/day: 0.25    Years: 5.00    Total pack years: 1.25    Types: Cigarettes   Smokeless tobacco: Current  Vaping Use   Vaping Use: Every day   Substances: Nicotine  Substance and Sexual Activity   Alcohol use: Yes    Comment: rare   Drug use: Not Currently   Sexual activity: Not on file

## 2022-12-04 ENCOUNTER — Other Ambulatory Visit: Payer: Self-pay

## 2022-12-04 ENCOUNTER — Emergency Department (HOSPITAL_COMMUNITY)
Admission: EM | Admit: 2022-12-04 | Discharge: 2022-12-05 | Disposition: A | Discharge status: Other Acute Inpatient Hospital | Payer: Medicaid Other | Attending: Emergency Medicine | Admitting: Emergency Medicine

## 2022-12-04 ENCOUNTER — Encounter (HOSPITAL_COMMUNITY): Payer: Self-pay

## 2022-12-04 DIAGNOSIS — F1410 Cocaine abuse, uncomplicated: Secondary | ICD-10-CM | POA: Insufficient documentation

## 2022-12-04 DIAGNOSIS — G8929 Other chronic pain: Secondary | ICD-10-CM | POA: Diagnosis not present

## 2022-12-04 DIAGNOSIS — R45851 Suicidal ideations: Secondary | ICD-10-CM | POA: Insufficient documentation

## 2022-12-04 DIAGNOSIS — I10 Essential (primary) hypertension: Secondary | ICD-10-CM | POA: Insufficient documentation

## 2022-12-04 DIAGNOSIS — Z79899 Other long term (current) drug therapy: Secondary | ICD-10-CM | POA: Insufficient documentation

## 2022-12-04 DIAGNOSIS — M549 Dorsalgia, unspecified: Secondary | ICD-10-CM | POA: Insufficient documentation

## 2022-12-04 DIAGNOSIS — F1994 Other psychoactive substance use, unspecified with psychoactive substance-induced mood disorder: Secondary | ICD-10-CM | POA: Insufficient documentation

## 2022-12-04 DIAGNOSIS — F1494 Cocaine use, unspecified with cocaine-induced mood disorder: Secondary | ICD-10-CM | POA: Diagnosis present

## 2022-12-04 LAB — COMPREHENSIVE METABOLIC PANEL
ALT: 13 U/L (ref 0–44)
AST: 19 U/L (ref 15–41)
Albumin: 3.8 g/dL (ref 3.5–5.0)
Alkaline Phosphatase: 82 U/L (ref 38–126)
Anion gap: 11 (ref 5–15)
BUN: 18 mg/dL (ref 8–23)
CO2: 26 mmol/L (ref 22–32)
Calcium: 9.8 mg/dL (ref 8.9–10.3)
Chloride: 104 mmol/L (ref 98–111)
Creatinine, Ser: 1.1 mg/dL (ref 0.61–1.24)
GFR, Estimated: >60 mL/min (ref >60)
Glucose, Bld: 94 mg/dL (ref 70–99)
Potassium: 4.4 mmol/L (ref 3.5–5.1)
Sodium: 141 mmol/L (ref 135–145)
Total Bilirubin: 0.4 mg/dL (ref 0.3–1.2)
Total Protein: 6.6 g/dL (ref 6.5–8.1)

## 2022-12-04 LAB — RAPID URINE DRUG SCREEN, HOSP PERFORMED
Amphetamines: NOT DETECTED
Barbiturates: NOT DETECTED
Benzodiazepines: NOT DETECTED
Cocaine: POSITIVE — AB
Opiates: NOT DETECTED
Tetrahydrocannabinol: NOT DETECTED

## 2022-12-04 LAB — CBC
HCT: 48.6 % (ref 39.0–52.0)
Hemoglobin: 15.2 g/dL (ref 13.0–17.0)
MCH: 25.9 pg — ABNORMAL LOW (ref 26.0–34.0)
MCHC: 31.3 g/dL (ref 30.0–36.0)
MCV: 82.7 fL (ref 80.0–100.0)
Platelets: 289 10*3/uL (ref 150–400)
RBC: 5.88 MIL/uL — ABNORMAL HIGH (ref 4.22–5.81)
RDW: 15.7 % — ABNORMAL HIGH (ref 11.5–15.5)
WBC: 9.8 10*3/uL (ref 4.0–10.5)
nRBC: 0 % (ref 0.0–0.2)

## 2022-12-04 LAB — ETHANOL: Alcohol, Ethyl (B): <10 mg/dL (ref <10)

## 2022-12-04 LAB — ACETAMINOPHEN LEVEL: Acetaminophen (Tylenol), Serum: <10 ug/mL — ABNORMAL LOW (ref 10–30)

## 2022-12-04 LAB — SALICYLATE LEVEL: Salicylate Lvl: <7 mg/dL — ABNORMAL LOW (ref 7.0–30.0)

## 2022-12-04 MED ORDER — GABAPENTIN 300 MG PO CAPS
300.0000 mg | ORAL_CAPSULE | Freq: Once | ORAL | Status: AC
  Administered 2022-12-04: 300 mg via ORAL
  Filled 2022-12-04: qty 1

## 2022-12-04 MED ORDER — OXYCODONE HCL 5 MG PO TABS
20.0000 mg | ORAL_TABLET | ORAL | Status: AC
  Administered 2022-12-05: 20 mg via ORAL
  Filled 2022-12-04: qty 4

## 2022-12-04 MED ORDER — OXYCODONE HCL 5 MG PO TABS: 5.0000 mg | ORAL_TABLET | ORAL | Status: DC

## 2022-12-04 MED ORDER — OXYCODONE HCL 5 MG PO TABS
20.0000 mg | ORAL_TABLET | Freq: Once | ORAL | Status: AC
  Administered 2022-12-04: 20 mg via ORAL
  Filled 2022-12-04: qty 4

## 2022-12-04 MED ORDER — NICOTINE 21 MG/24HR TD PT24
21.0000 mg | MEDICATED_PATCH | Freq: Every day | TRANSDERMAL | Status: DC
  Administered 2022-12-05: 21 mg via TRANSDERMAL
  Filled 2022-12-04: qty 1

## 2022-12-04 NOTE — ED Notes (Signed)
Pt wanded by security. 

## 2022-12-04 NOTE — ED Notes (Signed)
Transfer of care report received from previous RN, Chloe. Pt provided with snack and drink.

## 2022-12-04 NOTE — Consult Note (Signed)
[facility] Face-to-Face Psychiatry Consult   Reason for Consult:  Psych consult  Referring Physician: Michelle Piper, PA-C  Patient Identification: Jeffery Gross MRN:  [id number] Principal Diagnosis: Suicidal ideation Diagnosis:  Principal Problem:   Suicidal ideation Active Problems:   Substance induced mood disorder (HCC)   Cocaine use disorder (HCC)   Total Time spent with patient: 30 minutes  Subjective:  Per EDP note" Jeffery Gross is a 62 y.o. male with a history of chronic pain, bilateral above-the-knee amputation, phantom limb pain, hypertension, polysubstance abuse who presents to the ED for evaluation of suicidal ideations. He states he has had suicidal ideations for quite some time now. He attempted to overdose on cocaine and fentanyl 1 week ago. Presented to his primary care provider due to this feeling today and was voluntarily brought to the emergency department by police. He states he has had a significant increase in life stressors recently involving his family and is facing homelessness. This is the cause for his worsening symptoms. He has chronic back pain but denies other symptoms at this time. Denies homicidal ideations, auditory or visual hallucinations. His current plan will be to place a bag over his head until he stops breathing. Presents today seeking help."   HPI:  Jeffery Gross is a 62 year old male patient with a past psychiatric history significant for MDD, cocaine use, and past suicide attempt who presented to the [facility] emergency department with complaints of suicidal ideations with a plan. Patient seen and evaluated face-to-face by this provider, chart reviewed and case discussed with Dr. Lucianne Muss. On evaluation, patient is sitting up in bed in no acute distress. He is alert and oriented x 4. His thought process is linear and speech is clear and coherent at an increased tone. His mood is dysphoric and affect is congruent. He has fair eye contact. He is casually  dressed. He is calm and cooperative. Patient endorses suicidal ideations "off and on for a little while." He states that a couple days ago he tried to overdose on cocaine and bust his heart. He continues to endorse suicidal ideations with a plan to place a plastic bag over his head or overdose on his gabapentin medicine. He reports a past suicide attempt 9 years ago by overdosing on amitriptyline which resulted in a coma for 4 days. He identifies current stressors as recurring thoughts of having to take girlfriend off life support 2 years ago and she died, currently homeless and living in a storage unit in [place], [place], and no family support. He states that a month ago he was living with his ex-girlfriend until she did not pay her part of the rent and he took off. When asked how did he get to [facility] today, he states that once a month he catches the public transportation to [facility] for pain management and today he came clean to his doctor that he tried to attempt suicide a couple days ago by trying to overdose on cocaine. He states that he knew he was going to fail his drug test because he attempted to kill himself a couple days ago by using more cocaine. He states that if he does not get help he is going to end up succeeding in committing suicide. He reports using cocaine intermittently for the past 15-20 years. He reports using cocaine every day if he can get it. He states that he last used cocaine yesterday. He denies using other illicit drugs.  He reports smoking a pack  of cigarettes every couple days. He reports occasional alcohol use. He reports that he last consumed alcohol 2 days ago. BAL negative on arrival. UDS is positive for cocaine. He denies homicidal ideations. He denies auditory or visual hallucinations. There is no objective evidence that the patient is currently responding to internal or external stimuli. He endorses depressive symptoms of feelings of sadness, hopelessness,  and worthlessness. He reports a fair appetite. He reports fair sleep.  He denies legal issues. He receives SSI benefits. He denies outpatient psychiatry or therapy. He states that he is prescribed Cymbalta for depression and chronic pain and gabapentin for anxiety and chronic pain by [facility]. He states that he hasn't taken his medication in a couple of days. He reports a history of chronic back pain from multiple past back surgeries. He reports substance abuse treatment at [facility] 20 years ago after he had to threaten [facility] to send him for treatment. He states that he is interested in long term residential treatment. I explained to the patient that he is recommended for inpatient psychiatric treatment at this time due to active suicidal ideations with a plan. I discussed with the patient once he is stabilized and is no longer experiencing suicidal ideations with a plan then the psychiatry team can consider residential substance abuse treatment for his cocaine use. Patient verbalizes understanding.  Past Psychiatric History: History of MDD, cocaine use, past suicide attempt 9 years ago via overdose which resulted patient to being in a coma for 4 days. Patient reports past psychiatric inpatient hospitalizations at [facility] last year and [facility] 9 years ago. Past outpatient psychiatry at [facility] years ago. No outpatient psychiatry or therapy currently.   Risk to Self:  Yes  Risk to Others:  No  Prior Inpatient Therapy:  Yes  Prior Outpatient Therapy:  Yes   Past Medical History:  Past Medical History:  Diagnosis Date   Back pain    Chest pain    Depression    DVT (deep venous thrombosis) (HCC)    GERD (gastroesophageal reflux disease)    History of kidney stones    passed   HTN (hypertension)    no longer has HTN    Past Surgical History:  Procedure Laterality Date   AMPUTATION Bilateral 01/09/2016   Procedure: Bilateral Above Knee Amputation, Apply Wound  VAC;  Surgeon: Nadara Mustard, MD;  Location: [facility] OR;  Service: Orthopedics;  Laterality: Bilateral;   AMPUTATION Right 08/09/2022   Procedure: RIGHT REVISION AMPUTATION ABOVE KNEE;  Surgeon: Nadara Mustard, MD;  Location: [facility] OR;  Service: Orthopedics;  Laterality: Right;   FASCIOTOMY Bilateral 01/03/2016   Procedure: FASCIOTOMY;  Surgeon: Tarry Kos, MD;  Location: [facility] OR;  Service: Orthopedics;  Laterality: Bilateral;   I & D EXTREMITY Bilateral 01/05/2016   Procedure: IRRIGATION AND DEBRIDEMENT BILATERAL LOWER EXTREMITIES; WOUND VAC CHANGE;  Surgeon: Tarry Kos, MD;  Location: [facility] OR;  Service: Orthopedics;  Laterality: Bilateral;   I & D EXTREMITY Left 01/07/2016   Procedure: Irrigation and Debridement of Left Lower Extremity with Application of Wound Vac;  Surgeon: Tarry Kos, MD;  Location: [facility] OR;  Service: Orthopedics;  Laterality: Left;   STUMP REVISION Right 07/04/2022   Procedure: REVISION RIGHT ABOVE KNEE AMPUTATION;  Surgeon: Nadara Mustard, MD;  Location: [facility] OR;  Service: Orthopedics;  Laterality: Right;   VASECTOMY     Family History:  Family History  Problem Relation Age of Onset  Hypertension Father    Family Psychiatric  History: No known family psychiatric history reported.   Social History: Homelessness. He denies legal issues. He receives SSI benefits. Cocaine use.   Social History   Substance and Sexual Activity  Alcohol Use Yes   Comment: rare     Social History   Substance and Sexual Activity  Drug Use Yes   Types: Cocaine    Social History   Socioeconomic History   Marital status: Single    Spouse name: Not on file   Number of children: 3   Years of education: Not on file   Highest education level: Not on file  Occupational History   Occupation: disabled  Tobacco Use   Smoking status: Every Day    Packs/day: 0.25    Years: 5.00    Additional pack years: 0.00    Total pack years: 1.25    Types: Cigarettes   Smokeless tobacco: Current  Vaping  Use   Vaping Use: Every day   Substances: Nicotine  Substance and Sexual Activity   Alcohol use: Yes    Comment: rare   Drug use: Yes    Types: Cocaine   Sexual activity: Not on file  Other Topics Concern   Not on file  Social History Narrative   Not on file   Social Determinants of Health   Financial Resource Strain: Not on file  Food Insecurity: Food Insecurity Present (07/13/2022)   Hunger Vital Sign    Worried About Running Out of Food in the Last Year: Sometimes true    Ran Out of Food in the Last Year: Sometimes true  Transportation Needs: No Transportation Needs (07/13/2022)   PRAPARE - Administrator, Civil Service (Medical): No    Lack of Transportation (Non-Medical): No  Physical Activity: Not on file  Stress: Not on file  Social Connections: Not on file   Additional Social History:    Allergies:   Allergies  Allergen Reactions   Sulfacetamide Sodium Swelling    Edema     Labs:  Results for orders placed or performed during the hospital encounter of 12/04/22 (from the past 48 hour(s))  Comprehensive metabolic panel     Status: None   Collection Time: 12/04/22  3:07 PM  Result Value Ref Range   Sodium 141 135 - 145 mmol/L   Potassium 4.4 3.5 - 5.1 mmol/L   Chloride 104 98 - 111 mmol/L   CO2 26 22 - 32 mmol/L   Glucose, Bld 94 70 - 99 mg/dL    Comment: Glucose reference range applies only to samples taken after fasting for at least 8 hours.   BUN 18 8 - 23 mg/dL   Creatinine, Ser 1.61 0.61 - 1.24 mg/dL   Calcium 9.8 8.9 - 09.6 mg/dL   Total Protein 6.6 6.5 - 8.1 g/dL   Albumin 3.8 3.5 - 5.0 g/dL   AST 19 15 - 41 U/L   ALT 13 0 - 44 U/L   Alkaline Phosphatase 82 38 - 126 U/L   Total Bilirubin 0.4 0.3 - 1.2 mg/dL   GFR, Estimated >04 >54 mL/min    Comment: (NOTE) Calculated using the CKD-EPI Creatinine Equation (2021)    Anion gap 11 5 - 15    Comment: Performed at [facility], [address], [place], [place] [address]  cbc      Status: Abnormal   Collection Time: 12/04/22  3:07 PM  Result Value Ref Range  WBC 9.8 4.0 - 10.5 K/uL   RBC 5.88 (H) 4.22 - 5.81 MIL/uL   Hemoglobin 15.2 13.0 - 17.0 g/dL   HCT 24.4 01.0 - 27.2 %   MCV 82.7 80.0 - 100.0 fL   MCH 25.9 (L) 26.0 - 34.0 pg   MCHC 31.3 30.0 - 36.0 g/dL   RDW 53.6 (H) 64.4 - 03.4 %   Platelets 289 150 - 400 K/uL   nRBC 0.0 0.0 - 0.2 %    Comment: Performed at [facility], [address], [place], [place] [address]  Ethanol     Status: None   Collection Time: 12/04/22  3:09 PM  Result Value Ref Range   Alcohol, Ethyl (B) <10 <10 mg/dL    Comment: (NOTE) Lowest detectable limit for serum alcohol is 10 mg/dL.  For medical purposes only. Performed at [facility], [address] [place], [place] [address]   Salicylate level     Status: Abnormal   Collection Time: 12/04/22  3:09 PM  Result Value Ref Range   Salicylate Lvl <7.0 (L) 7.0 - 30.0 mg/dL    Comment: Performed at [facility], [address] [place], [place] [address]  Acetaminophen level     Status: Abnormal   Collection Time: 12/04/22  3:09 PM  Result Value Ref Range   Acetaminophen (Tylenol), Serum <10 (L) 10 - 30 ug/mL    Comment: (NOTE) Therapeutic concentrations vary significantly. A range of 10-30 ug/mL  may be an effective concentration for many patients. However, some  are best treated at concentrations outside of this range. Acetaminophen concentrations >150 ug/mL at 4 hours after ingestion  and >50 ug/mL at 12 hours after ingestion are often associated with  toxic reactions.  Performed at [facility], [address] [place], [place] [address]     No current facility-administered medications for this encounter.   Current Outpatient Medications  Medication Sig Dispense Refill   doxycycline (VIBRA-TABS) 100 MG tablet Take 1 tablet (100 mg total) by mouth 2 (two) times daily. 28 tablet 0   DULoxetine (CYMBALTA) 60 MG capsule Take 60 mg by mouth  in the morning.     gabapentin (NEURONTIN) 300 MG capsule Take 300 mg by mouth 3 (three) times daily.     omeprazole (PRILOSEC) 20 MG capsule Take 20 mg by mouth in the morning.     Oxycodone HCl 20 MG TABS Take 1 tablet (20 mg total) by mouth 5 (five) times daily. 30 tablet 0   senna-docusate (SENOKOT-S) 8.6-50 MG tablet Take 1 tablet by mouth 2 (two) times daily between meals as needed for mild constipation. 60 tablet 0   Vitamin D, Ergocalciferol, (DRISDOL) 1.25 MG (50000 UNIT) CAPS capsule Take 50,000 Units by mouth every Friday.      Psychiatric Specialty Exam:  Presentation  General Appearance:  Appropriate for Environment  Eye Contact: Fair  Speech: Clear and Coherent  Speech Volume: Increased  Handedness:No data recorded  Mood and Affect  Mood: Dysphoric  Affect: Congruent   Thought Process  Thought Processes: Coherent; Goal Directed  Descriptions of Associations:Intact  Orientation:Full (Time, Place and Person)  Thought Content:Logical  History of Schizophrenia/Schizoaffective disorder:No data recorded Duration of Psychotic Symptoms:No data recorded Hallucinations:Hallucinations: None  Ideas of Reference:None  Suicidal Thoughts:Suicidal Thoughts: Yes, Active SI Active Intent and/or Plan: With Plan; With Intent  Homicidal Thoughts:Homicidal Thoughts: No   Sensorium  Memory: Immediate Fair; Recent Fair; Remote Fair  Judgment: Poor  Insight: Poor   Art therapist  Concentration: Fair  Attention Span: Fair  Recall: [employer] of Knowledge: Fair  Language: Fair   Psychomotor Activity  Psychomotor Activity: Psychomotor Activity: Normal   Assets  Assets: Manufacturing systems engineer; Desire for Improvement   Sleep  Sleep: Sleep: Fair   Physical Exam: Physical Exam Cardiovascular:     Rate and Rhythm: Normal rate.  Pulmonary:     Effort: Pulmonary effort is normal.  Musculoskeletal:     Cervical back: Normal range  of motion.  Neurological:     Mental Status: He is alert and oriented to person, place, and time.    Review of Systems  Constitutional: Negative.   Eyes: Negative.   Respiratory: Negative.    Cardiovascular: Negative.   Neurological: Negative.   Endo/Heme/Allergies: Negative.   Psychiatric/Behavioral:  Positive for depression, substance abuse and suicidal ideas.    Blood pressure 134/85, pulse 82, temperature 98 F (36.7 C), temperature source Oral, resp. rate 18, height 6\' 2"  (1.88 m), weight 70.3 kg, SpO2 99 %. Body mass index is 19.9 kg/m.  Treatment Plan Summary: Patient is recommended for inpatient psychiatric treatment for active SI with a plan and mood stabilization. Patient's cocaine use and psychosocial stressors appear to be attributing to the patient's worsening symptoms. Psychiatry CSW to seek appropriate placement. Will consider residential substance abuse treatment when patient is no longer actively suicidal. Plan of care discussed with the patient. Patient verbalizes understanding.   Patient's home medications have not been reconciled by pharmacy. Patient is unable to recall the dosage for the Cymbalta and gabapentin. Patient home medications will need to be restarted once verified by pharmacy.  Disposition: Recommend psychiatric Inpatient admission when medically cleared.  Layla Barter, NP 12/04/2022 6:29 PM

## 2022-12-04 NOTE — ED Notes (Addendum)
RN gave patient night medications and was asked about his next dose of pain medication; RN advised that Oxy was one time dose. Pt started getting upset stating he go to pain clinic and receives med 5 x days; pt states he won't to sign himself out if we are not going to give him his pain med. RN read through chart and polysubstance abuse is in hx as well as it is noted that patient told NP he failed drug test with pain clinic today due to attempting to commit SI

## 2022-12-04 NOTE — ED Provider Notes (Signed)
EMERGENCY DEPARTMENT AT [facility] Provider Note   CSN: [id number] Arrival date & time: 12/04/22  1501     History  Chief Complaint  Patient presents with   Suicidal    Jeffery Gross is a 62 y.o. male.  With a history of chronic pain, bilateral above-the-knee amputation, phantom limb pain, hypertension, polysubstance abuse who presents to the ED for evaluation of suicidal ideations.  He states he has had suicidal ideations for quite some time now.  He attempted to overdose on cocaine and fentanyl 1 week ago.  Presented to his primary care provider due to this feeling today and was voluntarily brought to the emergency department by police.  He states he has had a significant increase in life stressors recently involving his family and is facing homelessness.  This is the cause for his worsening symptoms.  He has chronic back pain but denies other symptoms at this time.  Denies homicidal ideations, auditory or visual hallucinations.  His current plan will be to place a bag over his head until he stops breathing.  Presents today seeking help.  HPI     Home Medications Prior to Admission medications   Medication Sig Start Date End Date Taking? Authorizing Provider  doxycycline (VIBRA-TABS) 100 MG tablet Take 1 tablet (100 mg total) by mouth 2 (two) times daily. 09/05/22   Adonis Huguenin, NP  DULoxetine (CYMBALTA) 60 MG capsule Take 60 mg by mouth in the morning.    [provider]  gabapentin (NEURONTIN) 300 MG capsule Take 300 mg by mouth 3 (three) times daily. 05/16/22   [provider]  omeprazole (PRILOSEC) 20 MG capsule Take 20 mg by mouth in the morning. 03/19/22   [provider]  Oxycodone HCl 20 MG TABS Take 1 tablet (20 mg total) by mouth 5 (five) times daily. 07/01/22   Nadara Mustard, MD  senna-docusate (SENOKOT-S) 8.6-50 MG tablet Take 1 tablet by mouth 2 (two) times daily between meals as needed for mild constipation. 08/11/22    Almon Hercules, MD  Vitamin D, Ergocalciferol, (DRISDOL) 1.25 MG (50000 UNIT) CAPS capsule Take 50,000 Units by mouth every Friday. 04/30/22   [provider]      Allergies    Sulfacetamide sodium    Review of Systems   Review of Systems  Musculoskeletal:  Positive for back pain.  Psychiatric/Behavioral:  Positive for suicidal ideas.   All other systems reviewed and are negative.   Physical Exam Updated Vital Signs BP 134/85 (BP Location: Right Arm)   Pulse 82   Temp 98 F (36.7 C) (Oral)   Resp 18   Ht 6\' 2"  (1.88 m)   Wt 70.3 kg   SpO2 99%   BMI 19.90 kg/m  Physical Exam Vitals and nursing note reviewed.  Constitutional:      General: He is not in acute distress.    Appearance: Normal appearance. He is well-developed. He is not ill-appearing, toxic-appearing or diaphoretic.     Comments: Resting comfortably in bed  HENT:     Head: Normocephalic and atraumatic.  Eyes:     Conjunctiva/sclera: Conjunctivae normal.  Cardiovascular:     Rate and Rhythm: Normal rate and regular rhythm.     Heart sounds: No murmur heard. Pulmonary:     Effort: Pulmonary effort is normal. No respiratory distress.     Breath sounds: No wheezing, rhonchi or rales.  Abdominal:     Palpations: Abdomen is soft.  Tenderness: There is no abdominal tenderness.  Musculoskeletal:        General: No swelling.     Cervical back: Neck supple.     Comments: Bilateral above-the-knee amputation.  No midline C, T or L-spine TTP.  Skin:    General: Skin is warm and dry.     Capillary Refill: Capillary refill takes less than 2 seconds.  Neurological:     General: No focal deficit present.     Mental Status: He is alert and oriented to person, place, and time.  Psychiatric:        Mood and Affect: Mood normal.        Behavior: Behavior normal.     ED Results / Procedures / Treatments   Labs (all labs ordered are listed, but only abnormal results are displayed) Labs Reviewed   SALICYLATE LEVEL - Abnormal; Notable for the following components:      Result Value   Salicylate Lvl <7.0 (*)    All other components within normal limits  ACETAMINOPHEN LEVEL - Abnormal; Notable for the following components:   Acetaminophen (Tylenol), Serum <10 (*)    All other components within normal limits  CBC - Abnormal; Notable for the following components:   RBC 5.88 (*)    MCH 25.9 (*)    RDW 15.7 (*)    All other components within normal limits  COMPREHENSIVE METABOLIC PANEL  ETHANOL  RAPID URINE DRUG SCREEN, HOSP PERFORMED    EKG None  Radiology No results found.  Procedures Procedures    Medications Ordered in ED Medications  oxyCODONE (Oxy IR/ROXICODONE) immediate release tablet 20 mg (20 mg Oral Given 12/04/22 1658)    ED Course/ Medical Decision Making/ A&P                             Medical Decision Making Amount and/or Complexity of Data Reviewed Labs: ordered.  Risk Prescription drug management.  This patient presents to the ED for concern of suicidal ideations, this involves an extensive number of treatment options, and is a complaint that carries with it a high risk of complications and morbidity.   Co morbidities that complicate the patient evaluation  chronic pain, bilateral above-the-knee amputation, phantom limb pain, hypertension, polysubstance abuse   My initial workup includes medical clearance, TTS consult  Additional history obtained from: Nursing notes from this visit.  I ordered, reviewed and interpreted labs which include: CBC, CMP, ethanol, acetaminophen, salicylate, UDS.  Labs reassuring.  Afebrile, hemodynamically stable.  62 year old male presenting to the ED for evaluation of suicidal ideations.  This is due to the increase in life stressors lately.  He has history of suicide attempt, most recently 1 week ago by attempting to overdose with cocaine and fentanyl.  Presents today requesting help.  He denies HI, AVH.  Lab  workup reassuring.  Physical exam significant for bilateral above-the-knee amputations but no other obvious abnormalities.  He is calm, well mannered and agreeable to plan for TTS consult.  He is medically cleared at this time.  And TTS consult was placed.  Note: Portions of this report may have been transcribed using voice recognition software. Every effort was made to ensure accuracy; however, inadvertent computerized transcription errors may still be present.        Final Clinical Impression(s) / ED Diagnoses Final diagnoses:  Suicidal ideation    Rx / DC Orders ED Discharge Orders     None  Michelle Piper, Cordelia Poche 12/04/22 1737    Alvira Monday, MD 12/05/22 1356

## 2022-12-04 NOTE — ED Notes (Signed)
Pt belongings placed in locker number 1 ?

## 2022-12-04 NOTE — ED Triage Notes (Signed)
Pt brought in by [employer] from PCP for SI. Pt states his plan is to put a bag over his head "until I go to sleep." Pt states he has tried several times to OD in the past week with drugs. Pt states he thought he was getting fentanyl, but he got cocaine.

## 2022-12-04 NOTE — ED Notes (Signed)
Psych provider at bedside

## 2022-12-04 NOTE — ED Notes (Signed)
[facility] paperwork in process 

## 2022-12-04 NOTE — ED Notes (Signed)
PT to bathroom on personal wheelchair.

## 2022-12-05 NOTE — Progress Notes (Signed)
Pt was accepted to [facility] TODAY 12/05/2022; Bed Assignment Main [facility]   Pt meets inpatient criteria per  Liborio Nixon, NP.     Attending Physician will be Dr. Loni Beckwith   Report can be called to:[phone]-Pager number, please leave a returned phone number to receive a phone call back.    Pt can arrive after 10:00am  Care Team notified: Cathie Beams, Theresia Majors, Adonis Brook, RN   [name], MSW, [facility] 12/05/2022 1:23 AM

## 2022-12-05 NOTE — ED Notes (Signed)
Report received assumed care pt asleep resp even and non labored with sitter

## 2022-12-05 NOTE — Progress Notes (Signed)
LCSW Progress Note  [id number]   Jeffery Gross  12/05/2022  12:34 AM    Inpatient Behavioral Health Placement  Pt meets inpatient criteria per Liborio Nixon, NP. There are no available beds within [facility] system per [facility] [employer] . Referral was sent to the following facilities;   Destination  Service Provider Address Phone Fax  [facility]  [address] [place] [address] [phone]  [facility] [address]  [address], [place] [address] [phone]  [facility]  [address] [place], [place] [address] [phone]  [facility] [address] [place]  [address], [place] [address] [phone]  [facility]  [address] [place] [address] [phone]  [facility]  [address] [place] [address] [phone]  [facility]  [address] [place], [place] [address] [phone]  [facility]  [address] [place] [address] [phone]  [facility]  [address], [place] [address] [phone]  [facility]  [address], [place] [address] [phone]  [facility]  [address], [place] [address] [phone]  [facility]  [address], [place] [address] [phone]  [facility]  [address] [place] [address] [phone]  [facility]  [address] [place] [address] [phone]  [facility]  [address] [place] [address] [phone]  [facility]  [address]  [place] [address] [phone]  [facility]  [address], [place] [address] [phone]  [facility] Office  [address], [place] [address] [phone]  [facility]  [address] [place] [address] [phone]  [facility]  [address] [place] [address] [phone]  [facility]  [address], [place] [address] [phone]  [facility]  [address] [place] [address] [phone]  [facility]  [address] [place] [address] [phone]    Situation ongoing,  CSW will follow up.    [name], MSW, [facility] 12/05/2022 12:34 AM

## 2022-12-05 NOTE — ED Notes (Signed)
IVC paperwork: 3 copies on clipboard Original in red folder  Copy in medical records

## 2022-12-05 NOTE — ED Notes (Signed)
Report called to [facility] and transportation arranged

## 2022-12-05 NOTE — ED Provider Notes (Signed)
Emergency Medicine Observation Re-evaluation Note  Jeffery Gross is a 62 y.o. male, seen on rounds today.  Pt initially presented to the ED for complaints of Suicidal Currently, the patient is resting quietly.  Physical Exam  BP 120/68 (BP Location: Left Arm)   Pulse 60   Temp 98 F (36.7 C)   Resp 17   Ht 6\' 2"  (1.88 m)   Wt 70.3 kg   SpO2 96%   BMI 19.90 kg/m  Physical Exam General: No acute distress Cardiac: Well-perfused Lungs: Nonlabored Psych: Cooperative  ED Course / MDM  EKG:   I have reviewed the labs performed to date as well as medications administered while in observation.  Recent changes in the last 24 hours include patient has been accepted to [facility].  Plan  Current plan is for transfer to [facility] after 10 AM.    Terrilee Files, MD 12/05/22 [phone]

## 2023-01-07 ENCOUNTER — Other Ambulatory Visit: Payer: Self-pay

## 2023-01-07 ENCOUNTER — Emergency Department (HOSPITAL_COMMUNITY)
Admission: EM | Admit: 2023-01-07 | Discharge: 2023-01-08 | Disposition: A | Discharge status: Other Acute Inpatient Hospital | Payer: Medicaid Other | Attending: Emergency Medicine | Admitting: Emergency Medicine

## 2023-01-07 DIAGNOSIS — D631 Anemia in chronic kidney disease: Secondary | ICD-10-CM | POA: Insufficient documentation

## 2023-01-07 DIAGNOSIS — F1914 Other psychoactive substance abuse with psychoactive substance-induced mood disorder: Secondary | ICD-10-CM | POA: Diagnosis not present

## 2023-01-07 DIAGNOSIS — Z59819 Housing instability, housed unspecified: Secondary | ICD-10-CM | POA: Insufficient documentation

## 2023-01-07 DIAGNOSIS — E876 Hypokalemia: Secondary | ICD-10-CM | POA: Diagnosis not present

## 2023-01-07 DIAGNOSIS — F1410 Cocaine abuse, uncomplicated: Secondary | ICD-10-CM | POA: Diagnosis not present

## 2023-01-07 DIAGNOSIS — F1994 Other psychoactive substance use, unspecified with psychoactive substance-induced mood disorder: Secondary | ICD-10-CM

## 2023-01-07 DIAGNOSIS — I129 Hypertensive chronic kidney disease with stage 1 through stage 4 chronic kidney disease, or unspecified chronic kidney disease: Secondary | ICD-10-CM | POA: Diagnosis not present

## 2023-01-07 DIAGNOSIS — R45850 Homicidal ideations: Secondary | ICD-10-CM | POA: Insufficient documentation

## 2023-01-07 DIAGNOSIS — N189 Chronic kidney disease, unspecified: Secondary | ICD-10-CM | POA: Insufficient documentation

## 2023-01-07 DIAGNOSIS — R45851 Suicidal ideations: Secondary | ICD-10-CM | POA: Diagnosis not present

## 2023-01-07 LAB — URINALYSIS, ROUTINE W REFLEX MICROSCOPIC
Bacteria, UA: NONE SEEN
Bilirubin Urine: NEGATIVE
Glucose, UA: NEGATIVE mg/dL
Hgb urine dipstick: NEGATIVE
Ketones, ur: NEGATIVE mg/dL
Nitrite: NEGATIVE
Protein, ur: NEGATIVE mg/dL
Specific Gravity, Urine: 1.016 (ref 1.005–1.030)
pH: 5 (ref 5.0–8.0)

## 2023-01-07 LAB — CBC WITH DIFFERENTIAL/PLATELET
Abs Immature Granulocytes: 0.02 10*3/uL (ref 0.00–0.07)
Basophils Absolute: 0.1 10*3/uL (ref 0.0–0.1)
Basophils Relative: 1 %
Eosinophils Absolute: 0.3 10*3/uL (ref 0.0–0.5)
Eosinophils Relative: 3 %
HCT: 43.3 % (ref 39.0–52.0)
Hemoglobin: 14 g/dL (ref 13.0–17.0)
Immature Granulocytes: 0 %
Lymphocytes Relative: 30 %
Lymphs Abs: 3.2 10*3/uL (ref 0.7–4.0)
MCH: 26.9 pg (ref 26.0–34.0)
MCHC: 32.3 g/dL (ref 30.0–36.0)
MCV: 83.1 fL (ref 80.0–100.0)
Monocytes Absolute: 0.9 10*3/uL (ref 0.1–1.0)
Monocytes Relative: 8 %
Neutro Abs: 6.2 10*3/uL (ref 1.7–7.7)
Neutrophils Relative %: 58 %
Platelets: 230 10*3/uL (ref 150–400)
RBC: 5.21 MIL/uL (ref 4.22–5.81)
RDW: 14.6 % (ref 11.5–15.5)
WBC: 10.7 10*3/uL — ABNORMAL HIGH (ref 4.0–10.5)
nRBC: 0 % (ref 0.0–0.2)

## 2023-01-07 LAB — BASIC METABOLIC PANEL
Anion gap: 7 (ref 5–15)
BUN: 22 mg/dL (ref 8–23)
CO2: 23 mmol/L (ref 22–32)
Calcium: 8.9 mg/dL (ref 8.9–10.3)
Chloride: 106 mmol/L (ref 98–111)
Creatinine, Ser: 0.98 mg/dL (ref 0.61–1.24)
GFR, Estimated: >60 mL/min (ref >60)
Glucose, Bld: 116 mg/dL — ABNORMAL HIGH (ref 70–99)
Potassium: 3 mmol/L — ABNORMAL LOW (ref 3.5–5.1)
Sodium: 136 mmol/L (ref 135–145)

## 2023-01-07 LAB — RAPID URINE DRUG SCREEN, HOSP PERFORMED
Amphetamines: NOT DETECTED
Barbiturates: NOT DETECTED
Benzodiazepines: NOT DETECTED
Cocaine: POSITIVE — AB
Opiates: NOT DETECTED
Tetrahydrocannabinol: NOT DETECTED

## 2023-01-07 LAB — ETHANOL: Alcohol, Ethyl (B): <10 mg/dL (ref <10)

## 2023-01-07 MED ORDER — DOXYCYCLINE HYCLATE 100 MG PO TABS
100.0000 mg | ORAL_TABLET | Freq: Once | ORAL | Status: AC
  Administered 2023-01-07: 100 mg via ORAL
  Filled 2023-01-07: qty 1

## 2023-01-07 MED ORDER — HYDROCODONE-ACETAMINOPHEN 5-325 MG PO TABS
2.0000 | ORAL_TABLET | Freq: Once | ORAL | Status: AC
  Administered 2023-01-07: 2 via ORAL
  Filled 2023-01-07: qty 2

## 2023-01-07 MED ORDER — GABAPENTIN 300 MG PO CAPS
300.0000 mg | ORAL_CAPSULE | Freq: Three times a day (TID) | ORAL | Status: DC
  Administered 2023-01-08: 300 mg via ORAL
  Filled 2023-01-07 (×2): qty 1

## 2023-01-07 MED ORDER — OXYCODONE-ACETAMINOPHEN 5-325 MG PO TABS
1.0000 | ORAL_TABLET | Freq: Once | ORAL | Status: AC
  Administered 2023-01-08: 1 via ORAL
  Filled 2023-01-07: qty 1

## 2023-01-07 MED ORDER — DOXYCYCLINE HYCLATE 100 MG PO TABS
100.0000 mg | ORAL_TABLET | Freq: Two times a day (BID) | ORAL | Status: DC
  Administered 2023-01-08: 100 mg via ORAL
  Filled 2023-01-07 (×2): qty 1

## 2023-01-07 MED ORDER — DULOXETINE HCL 30 MG PO CPEP
60.0000 mg | ORAL_CAPSULE | Freq: Every morning | ORAL | Status: DC
  Filled 2023-01-07: qty 2

## 2023-01-07 MED ORDER — PANTOPRAZOLE SODIUM 40 MG PO TBEC
80.0000 mg | DELAYED_RELEASE_TABLET | Freq: Every day | ORAL | Status: DC
  Administered 2023-01-08: 80 mg via ORAL
  Filled 2023-01-07 (×2): qty 2

## 2023-01-07 MED ORDER — POTASSIUM CHLORIDE CRYS ER 20 MEQ PO TBCR
40.0000 meq | EXTENDED_RELEASE_TABLET | Freq: Two times a day (BID) | ORAL | Status: AC
  Administered 2023-01-07 – 2023-01-08 (×2): 40 meq via ORAL
  Filled 2023-01-07: qty 2

## 2023-01-07 NOTE — ED Notes (Signed)
Instructed pt to place all belongings on self in personal belongings bag. Personal belongings bag and backpack placed in locker #1 with pt ID label. Pt does have personal wheelchair by stretcher with pt ID labels on device. Pt dressed in burgundy scrubs provided by [facility] ED staff. Yellow non-slip socks NA for this pt.

## 2023-01-07 NOTE — BH Assessment (Signed)
Disposition Note:   Per Shuvon Rankin, NP, the patient is recommended for inpatient treatment. Danika, RN, from [facility], has been requested to assess the patient for admission, pending bed availability at [facility].

## 2023-01-07 NOTE — ED Provider Notes (Signed)
[place] EMERGENCY DEPARTMENT AT [facility] Provider Note   CSN: [id number] Arrival date & time: 01/07/23  0354     History  Chief Complaint  Patient presents with   Homeless   Suicidal    Jeffery Gross is a 62 y.o. male.  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  He also has history of chronic pain related to this and surgeries performed on his back.  Patient states that he was recently released after an admission at [facility], but only given a small supply of pain medication which she is now out of.  He tells me that he is suicidal and depressed over his current situation of being in chronic pain, being homeless, and having no resources or social support.    The history is provided by the patient.       Home Medications Prior to Admission medications   Medication Sig Start Date End Date Taking? Authorizing Provider  doxycycline (VIBRA-TABS) 100 MG tablet Take 1 tablet (100 mg total) by mouth 2 (two) times daily. 09/05/22   Adonis Huguenin, NP  DULoxetine (CYMBALTA) 60 MG capsule Take 60 mg by mouth in the morning.    [provider]  gabapentin (NEURONTIN) 300 MG capsule Take 300 mg by mouth 3 (three) times daily. 05/16/22   [provider]  omeprazole (PRILOSEC) 20 MG capsule Take 20 mg by mouth in the morning. 03/19/22   [provider]  Oxycodone HCl 20 MG TABS Take 1 tablet (20 mg total) by mouth 5 (five) times daily. 07/01/22   Nadara Mustard, MD  senna-docusate (SENOKOT-S) 8.6-50 MG tablet Take 1 tablet by mouth 2 (two) times daily between meals as needed for mild constipation. 08/11/22   Almon Hercules, MD  Vitamin D, Ergocalciferol, (DRISDOL) 1.25 MG (50000 UNIT) CAPS capsule Take 50,000 Units by mouth every Friday. 04/30/22   [provider]      Allergies    Sulfacetamide sodium    Review of Systems    Review of Systems  All other systems reviewed and are negative.   Physical Exam Updated Vital Signs BP (!) 142/83 (BP Location: Left Arm)   Pulse 88   Temp 98.5 F (36.9 C)   Resp 18   SpO2 100%  Physical Exam Vitals and nursing note reviewed.  Constitutional:      Appearance: Normal appearance.  HENT:     Head: Normocephalic.  Pulmonary:     Effort: Pulmonary effort is normal.  Musculoskeletal:     Cervical back: Normal range of motion.     Comments: Status post bilateral above-the-knee amputation  Skin:    General: Skin is warm and dry.  Neurological:     Mental Status: He is alert and oriented to person, place, and time.     ED Results / Procedures / Treatments   Labs (all labs ordered are listed, but only abnormal results are displayed) Labs Reviewed - No data to display  EKG None  Radiology No results found.  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  Patient also has history of homelessness, depression, and chronic pain.  He has been off of his pain medications for the past 2 months and  reports feeling suicidal due to the pain in his current living and family situation.    Laboratory studies obtained including CBC and metabolic panel, both of which were unremarkable.  EtOH level is less than 10.  Urinalysis is basically clear, but urine drug screen is positive for cocaine.  He tells me he uses cocaine to manage his pain because he is out of his pain medication.  CTS will be consulted and final disposition pending their recommendations.  Final Clinical Impression(s) / ED Diagnoses Final diagnoses:  None    Rx / DC Orders ED Discharge Orders     None         Geoffery Lyons, MD 01/08/23 [phone]

## 2023-01-07 NOTE — ED Notes (Signed)
Pt sleeping, difficult to wake.

## 2023-01-07 NOTE — ED Notes (Signed)
Patient speaking with TTS at this time.  

## 2023-01-07 NOTE — ED Triage Notes (Signed)
Pt BIB [facility], pt is a bilateral BKA and is homeless. Pt c/o chronic pain and reports he is out of his oxycodone. Pt wants to be placed somewhere "I cannot live like this, with no where to go"

## 2023-01-07 NOTE — ED Notes (Signed)
Pt states he does have SI due to the pain he is experiencing. Says he has been on pain medication for years and he has been out for two weeks. He expresses that he feels like he needs to be placed somewhere to get help. Pt is calm and cooperative at this time.

## 2023-01-07 NOTE — Group Note (Signed)
Recreation Therapy Group Note   Group Topic:Relaxation  Group Date: 01/07/2023 Start Time:  2:00 PM End Time:  2:45 PM Facilitators: Rosina Lowenstein, LRT, CTRS Location:  Dayroom  Group Description: PMR (Progressive Muscle Relaxation). LRT asks patients their current level of stress/anxiety from 1-10, with 10 being the highest. LRT educates patients on what PMR is and the benefits that come from it. Patients are asked to sit with their feet flat on the floor while sitting up and all the way back in their chair, if possible. LRT and pts follow a prompt through a speaker that requires you to tense and release different muscles in their body and focus on their breathing. During session, lights are off and soft music is being played. At the end of the prompt, LRT asks patients to rank their current levels of stress/anxiety from 1-10, 10 being the highest.   Goal Area(s) Addressed:  Patients will be able to describe progressive muscle relaxation.  Patient will practice using relaxation technique. Patient will identify a new coping skill.  Patient will follow multistep directions to reduce anxiety and stress.  Affect/Mood: N/A   Participation Level: Did not attend    Clinical Observations/Individualized Feedback: Nickolaus did not attend group due to not being on the unit yet.   Plan: Continue to engage patient in RT group sessions 2-3x/week.   Rosina Lowenstein, LRT, CTRS 01/07/2023 3:20 PM
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
## 2023-01-07 NOTE — ED Notes (Signed)
Patient refusing to have vitals checked

## 2023-01-07 NOTE — Group Note (Signed)
Date:  01/07/2023 Time:  8:40 PM  Group Topic/Focus:  Crisis Planning:   The purpose of this group is to help patients create a crisis plan for use upon discharge or in the future, as needed.    Participation Level:  Active  Participation Quality:  Appropriate  Affect:  Appropriate  Cognitive:  Appropriate  Insight: Appropriate  Engagement in Group:  Engaged  Modes of Intervention:  Education  Additional Comments:    Jeannette How 01/07/2023, 8:40 PM

## 2023-01-08 ENCOUNTER — Emergency Department
Admission: EM | Admit: 2023-01-08 | Discharge: 2023-01-08 | Disposition: A | Discharge status: Other Acute Inpatient Hospital | Payer: Medicaid Other | Source: Home / Self Care

## 2023-01-08 ENCOUNTER — Encounter: Payer: Self-pay | Admitting: Registered Nurse

## 2023-01-08 ENCOUNTER — Inpatient Hospital Stay
Admission: AD | Admit: 2023-01-08 | Discharge: 2023-01-20 | DRG: 885 | Disposition: A | Discharge status: Home / Self Care | Payer: Medicaid Other | Source: Intra-hospital | Attending: Psychiatry | Admitting: Psychiatry

## 2023-01-08 ENCOUNTER — Other Ambulatory Visit: Payer: Self-pay

## 2023-01-08 DIAGNOSIS — Z89612 Acquired absence of left leg above knee: Secondary | ICD-10-CM

## 2023-01-08 DIAGNOSIS — M545 Low back pain, unspecified: Secondary | ICD-10-CM | POA: Diagnosis present

## 2023-01-08 DIAGNOSIS — F1410 Cocaine abuse, uncomplicated: Secondary | ICD-10-CM | POA: Diagnosis present

## 2023-01-08 DIAGNOSIS — Z5900 Homelessness unspecified: Secondary | ICD-10-CM

## 2023-01-08 DIAGNOSIS — Z638 Other specified problems related to primary support group: Secondary | ICD-10-CM | POA: Diagnosis not present

## 2023-01-08 DIAGNOSIS — F17290 Nicotine dependence, other tobacco product, uncomplicated: Secondary | ICD-10-CM | POA: Diagnosis present

## 2023-01-08 DIAGNOSIS — Z8614 Personal history of Methicillin resistant Staphylococcus aureus infection: Secondary | ICD-10-CM | POA: Diagnosis not present

## 2023-01-08 DIAGNOSIS — Z8249 Family history of ischemic heart disease and other diseases of the circulatory system: Secondary | ICD-10-CM

## 2023-01-08 DIAGNOSIS — Z9852 Vasectomy status: Secondary | ICD-10-CM | POA: Diagnosis not present

## 2023-01-08 DIAGNOSIS — Z882 Allergy status to sulfonamides status: Secondary | ICD-10-CM

## 2023-01-08 DIAGNOSIS — Z89611 Acquired absence of right leg above knee: Secondary | ICD-10-CM | POA: Diagnosis not present

## 2023-01-08 DIAGNOSIS — G8929 Other chronic pain: Secondary | ICD-10-CM | POA: Diagnosis present

## 2023-01-08 DIAGNOSIS — F332 Major depressive disorder, recurrent severe without psychotic features: Secondary | ICD-10-CM | POA: Diagnosis not present

## 2023-01-08 DIAGNOSIS — Z79899 Other long term (current) drug therapy: Secondary | ICD-10-CM | POA: Diagnosis not present

## 2023-01-08 DIAGNOSIS — K219 Gastro-esophageal reflux disease without esophagitis: Secondary | ICD-10-CM | POA: Diagnosis present

## 2023-01-08 DIAGNOSIS — L03211 Cellulitis of face: Secondary | ICD-10-CM | POA: Diagnosis not present

## 2023-01-08 DIAGNOSIS — F1914 Other psychoactive substance abuse with psychoactive substance-induced mood disorder: Secondary | ICD-10-CM | POA: Diagnosis not present

## 2023-01-08 DIAGNOSIS — I10 Essential (primary) hypertension: Secondary | ICD-10-CM | POA: Diagnosis present

## 2023-01-08 DIAGNOSIS — Z993 Dependence on wheelchair: Secondary | ICD-10-CM

## 2023-01-08 DIAGNOSIS — Z87442 Personal history of urinary calculi: Secondary | ICD-10-CM

## 2023-01-08 DIAGNOSIS — Z5902 Unsheltered homelessness: Secondary | ICD-10-CM

## 2023-01-08 DIAGNOSIS — R45851 Suicidal ideations: Secondary | ICD-10-CM | POA: Diagnosis present

## 2023-01-08 DIAGNOSIS — Z5941 Food insecurity: Secondary | ICD-10-CM | POA: Diagnosis not present

## 2023-01-08 DIAGNOSIS — F1120 Opioid dependence, uncomplicated: Secondary | ICD-10-CM | POA: Diagnosis present

## 2023-01-08 DIAGNOSIS — F17210 Nicotine dependence, cigarettes, uncomplicated: Secondary | ICD-10-CM | POA: Diagnosis present

## 2023-01-08 LAB — BASIC METABOLIC PANEL
Anion gap: 7 (ref 5–15)
BUN: 21 mg/dL (ref 8–23)
CO2: 22 mmol/L (ref 22–32)
Calcium: 8.8 mg/dL — ABNORMAL LOW (ref 8.9–10.3)
Chloride: 112 mmol/L — ABNORMAL HIGH (ref 98–111)
Creatinine, Ser: 1.02 mg/dL (ref 0.61–1.24)
GFR, Estimated: >60 mL/min (ref >60)
Glucose, Bld: 120 mg/dL — ABNORMAL HIGH (ref 70–99)
Potassium: 4.4 mmol/L (ref 3.5–5.1)
Sodium: 141 mmol/L (ref 135–145)

## 2023-01-08 MED ORDER — DIPHENHYDRAMINE HCL 25 MG PO CAPS: 50.0000 mg | ORAL_CAPSULE | Freq: Three times a day (TID) | ORAL | Status: DC | PRN

## 2023-01-08 MED ORDER — GABAPENTIN 300 MG PO CAPS
300.0000 mg | ORAL_CAPSULE | Freq: Three times a day (TID) | ORAL | Status: DC
  Administered 2023-01-08 – 2023-01-20 (×38): 300 mg via ORAL
  Filled 2023-01-08 (×38): qty 1

## 2023-01-08 MED ORDER — HALOPERIDOL LACTATE 5 MG/ML IJ SOLN: 5.0000 mg | Freq: Three times a day (TID) | INTRAMUSCULAR | Status: DC | PRN

## 2023-01-08 MED ORDER — NICOTINE 14 MG/24HR TD PT24
14.0000 mg | MEDICATED_PATCH | TRANSDERMAL | Status: DC
  Administered 2023-01-08 – 2023-01-16 (×9): 14 mg via TRANSDERMAL
  Filled 2023-01-08 (×11): qty 1

## 2023-01-08 MED ORDER — HALOPERIDOL 5 MG PO TABS: 5.0000 mg | ORAL_TABLET | Freq: Three times a day (TID) | ORAL | Status: DC | PRN

## 2023-01-08 MED ORDER — ALUM & MAG HYDROXIDE-SIMETH 200-200-20 MG/5ML PO SUSP: 30.0000 mL | ORAL | Status: DC | PRN

## 2023-01-08 MED ORDER — MAGNESIUM HYDROXIDE 400 MG/5ML PO SUSP: 30.0000 mL | Freq: Every day | ORAL | Status: DC | PRN

## 2023-01-08 MED ORDER — DULOXETINE HCL 60 MG PO CPEP
60.0000 mg | ORAL_CAPSULE | Freq: Every morning | ORAL | Status: DC
  Administered 2023-01-08 – 2023-01-20 (×13): 60 mg via ORAL
  Filled 2023-01-08 (×13): qty 1

## 2023-01-08 MED ORDER — ACETAMINOPHEN 325 MG PO TABS
650.0000 mg | ORAL_TABLET | Freq: Four times a day (QID) | ORAL | Status: DC | PRN
  Administered 2023-01-10 – 2023-01-20 (×2): 650 mg via ORAL
  Filled 2023-01-08 (×2): qty 2

## 2023-01-08 MED ORDER — OXYCODONE-ACETAMINOPHEN 5-325 MG PO TABS
1.0000 | ORAL_TABLET | Freq: Once | ORAL | Status: AC
  Administered 2023-01-08: 1 via ORAL
  Filled 2023-01-08: qty 1

## 2023-01-08 MED ORDER — DIPHENHYDRAMINE HCL 50 MG/ML IJ SOLN: 50.0000 mg | Freq: Three times a day (TID) | INTRAMUSCULAR | Status: DC | PRN

## 2023-01-08 MED ORDER — VITAMIN D (ERGOCALCIFEROL) 1.25 MG (50000 UNIT) PO CAPS
50000.0000 [IU] | ORAL_CAPSULE | ORAL | Status: DC
  Administered 2023-01-09 – 2023-01-16 (×2): 50000 [IU] via ORAL
  Filled 2023-01-08 (×2): qty 1

## 2023-01-08 NOTE — Progress Notes (Signed)
   01/08/23 2300  Psych Admission Type (Psych Patients Only)  Admission Status Voluntary  Psychosocial Assessment  Patient Complaints Anxiety;Depression;Worthlessness;Helplessness  Eye Contact Fair  Facial Expression Sad  Affect Anxious;Depressed;Sad  Speech Logical/coherent  Interaction Assertive  Motor Activity Slow  Appearance/Hygiene Poor hygiene  Behavior Characteristics Cooperative;Appropriate to situation  Mood Anxious;Depressed;Worthless, low self-esteem;Sad  Thought Process  Coherency WDL  Content WDL  Delusions None reported or observed  Perception WDL  Hallucination None reported or observed  Judgment WDL  Confusion None  Danger to Self  Current suicidal ideation? Denies  Danger to Others  Danger to Others None reported or observed

## 2023-01-08 NOTE — Plan of Care (Signed)
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
## 2023-01-08 NOTE — Tx Team (Signed)
Initial Treatment Plan 01/08/2023 10:41 AM Jeffery Gross ZOX:[id number]    PATIENT STRESSORS: Financial difficulties   Health problems     PATIENT STRENGTHS: Capable of independent living  Communication skills    PATIENT IDENTIFIED PROBLEMS: Wants to get back to where he used to be                      DISCHARGE CRITERIA:  Ability to meet basic life and health needs  PRELIMINARY DISCHARGE PLAN: Placement in alternative living arrangements  PATIENT/FAMILY INVOLVEMENT: This treatment plan has been presented to and reviewed with the patient, Jeffery Gross. The patient has been given the opportunity to ask questions and make suggestions.  Doneen Poisson, RN 01/08/2023, 10:41 AM

## 2023-01-08 NOTE — Progress Notes (Signed)
Pt was accepted to [facility] Address: [address] [place], [place] [address] TODAY 01/08/2023; Bed Assignment 32  Pt meets inpatient criteria per [facility],NP  Attending Physician will be Dr. Shellee Milo  Report can be called to: - [phone]  Pt can arrive after: [facility] and [facility] RN will coordinate.  Care Team notified: Night [facility], [place], RN, [place], RN, [place], LCSW, Wandra Mannan, Paramedic   [place], [place] 01/08/2023 @ 1:02 AM

## 2023-01-08 NOTE — ED Notes (Signed)
Safe transport is here to pick up pt.  °

## 2023-01-08 NOTE — [facility] Suicide Risk Assessment (Signed)
[facility] Admission Suicide Risk Assessment   Nursing information obtained from:  Patient Demographic factors:  Male, Living alone, Low socioeconomic status, Caucasian, Unemployed Current Mental Status:  Suicidal ideation indicated by patient, Suicide plan Loss Factors:  NA Historical Factors:  Prior suicide attempts Risk Reduction Factors:  NA  Total Time spent with patient: 45 minutes Principal Problem: MDD (major depressive disorder), recurrent severe, without psychosis (HCC) Diagnosis:  Principal Problem:   MDD (major depressive disorder), recurrent severe, without psychosis (HCC) Active Problems:   S/P AKA (above knee amputation) bilateral (HCC)   Chronic low back pain   Cocaine abuse (HCC)   Opiate dependence (HCC)   Homelessness  Subjective Data: Patient seen and chart reviewed.  62 year old man with multiple medical problems social problems and substance use problems brought to the hospital after presenting in [place] reporting suicidal ideation.  Continues to report suicidal ideation.  Focused very much on somatic symptoms and homelessness.  Continued Clinical Symptoms:  Alcohol Use Disorder Identification Test Final Score (AUDIT): 0 The "Alcohol Use Disorders Identification Test", Guidelines for Use in Primary Care, Second Edition.  World Science writer [facility]). Score between 0-7:  no or low risk or alcohol related problems. Score between 8-15:  moderate risk of alcohol related problems. Score between 16-19:  high risk of alcohol related problems. Score 20 or above:  warrants further diagnostic evaluation for alcohol dependence and treatment.   CLINICAL FACTORS:   Depression:   Comorbid alcohol abuse/dependence Alcohol/Substance Abuse/Dependencies   Musculoskeletal: Strength & Muscle Tone: within normal limits Gait & Station: unable to stand Patient leans: N/A  Psychiatric Specialty Exam:  Presentation  General Appearance:  Appropriate for Environment  Eye  Contact: Fair  Speech: Clear and Coherent  Speech Volume: Increased  Handedness:No data recorded  Mood and Affect  Mood: Dysphoric  Affect: Congruent   Thought Process  Thought Processes: Coherent; Goal Directed  Descriptions of Associations:Intact  Orientation:Full (Time, Place and Person)  Thought Content:Logical  History of Schizophrenia/Schizoaffective disorder:No  Duration of Psychotic Symptoms:No data recorded Hallucinations:No data recorded Ideas of Reference:None  Suicidal Thoughts:No data recorded Homicidal Thoughts:No data recorded  Sensorium  Memory: Immediate Fair; Recent Fair; Remote Fair  Judgment: Poor  Insight: Poor   Executive Functions  Concentration: Fair  Attention Span: Fair  Recall: [employer] of Knowledge: Fair  Language: Fair   Psychomotor Activity  Psychomotor Activity:No data recorded  Assets  Assets: Communication Skills; Desire for Improvement   Sleep  Sleep:No data recorded   Physical Exam: Physical Exam Vitals and nursing note reviewed.  Constitutional:      Appearance: Normal appearance.  HENT:     Head: Normocephalic and atraumatic.     Mouth/Throat:     Pharynx: Oropharynx is clear.  Eyes:     Pupils: Pupils are equal, round, and reactive to light.  Cardiovascular:     Rate and Rhythm: Normal rate and regular rhythm.  Pulmonary:     Effort: Pulmonary effort is normal.     Breath sounds: Normal breath sounds.  Abdominal:     General: Abdomen is flat.     Palpations: Abdomen is soft.  Musculoskeletal:        General: Normal range of motion.     Comments: Bilateral above-the-knee amputations of both legs.  Skin:    General: Skin is warm and dry.  Neurological:     General: No focal deficit present.     Mental Status: He is alert. Mental status is at baseline.  Psychiatric:        Attention and Perception: Attention normal.        Mood and Affect: Mood is anxious.        Speech:  Speech normal.        Behavior: Behavior is cooperative.        Thought Content: Thought content includes suicidal ideation. Thought content does not include suicidal plan.        Cognition and Memory: Cognition normal.        Judgment: Judgment is impulsive.    Review of Systems  Constitutional: Negative.   HENT: Negative.    Eyes: Negative.   Respiratory: Negative.    Cardiovascular: Negative.   Gastrointestinal: Negative.   Musculoskeletal: Negative.   Skin: Negative.   Neurological: Negative.   Psychiatric/Behavioral:  Positive for depression, substance abuse and suicidal ideas. The patient is nervous/anxious.    Blood pressure 120/80, pulse 64, temperature 98 F (36.7 C), resp. rate 16, weight 64.9 kg, SpO2 98 %. Body mass index is 18.36 kg/m.   COGNITIVE FEATURES THAT CONTRIBUTE TO RISK:  Thought constriction (tunnel vision)    SUICIDE RISK:   Mild:  Suicidal ideation of limited frequency, intensity, duration, and specificity.  There are no identifiable plans, no associated intent, mild dysphoria and related symptoms, good self-control (both objective and subjective assessment), few other risk factors, and identifiable protective factors, including available and accessible social support.  PLAN OF CARE: Continue 15-minute checks.  Restarting psychiatric medicine.  Review labs.  Engage in individual and group therapy.  Ongoing assessment of dangerousness prior to discharge  I certify that inpatient services furnished can reasonably be expected to improve the patient's condition.   Mordecai Rasmussen, MD 01/08/2023, 4:16 PM

## 2023-01-08 NOTE — [facility] Group Notes (Signed)
[facility] Group Notes:  (Nursing/MHT/Case Management/Adjunct)  Date:  01/08/2023  Time:  10:07 AM  Type of Therapy:  Movement Therapy  Participation Level:  Did Not Attend    Dwyane Dupree P Sandeep Delagarza 01/08/2023, 10:07 AM 

## 2023-01-08 NOTE — ED Notes (Signed)
Pt refused vitals 

## 2023-01-08 NOTE — Progress Notes (Signed)
   01/08/23 1000  Psych Admission Type (Psych Patients Only)  Admission Status Voluntary  Psychosocial Assessment  Patient Complaints Anxiety;Depression;Crying spells;Loneliness;Nervousness;Sadness;Self-harm thoughts  Eye Contact Fair  Facial Expression Anxious;Sad  Affect Anxious;Sullen  Speech Logical/coherent  Interaction Assertive  Motor Activity Slow  Appearance/Hygiene Poor hygiene  Behavior Characteristics Cooperative  Mood Anxious  Thought Process  Coherency WDL  Content WDL  Delusions None reported or observed  Perception WDL  Hallucination None reported or observed  Judgment WDL  Confusion None  Danger to Self  Current suicidal ideation? Denies   Patient assertive and interacting with peers and staff. Did not attend afternoon group.

## 2023-01-08 NOTE — Progress Notes (Signed)
Pt meets inpatient behavioral health placement per Assunta Found, NP. CSW requested [facility] AC Fransico Michael, RN to review for inpatient placement at [facility]. CSW/Disposition team will assist and follow with placement.   [name], MSW, LCSWA 01/08/2023 12:28 AM

## 2023-01-08 NOTE — ED Provider Notes (Signed)
Emergency Medicine Observation Re-evaluation Note  Jeffery Gross is a 62 y.o. male, seen on rounds today.  Pt initially presented to the ED for complaints of housing instability, cocaine use, and related mood disorder. No new c/o this AM.   Physical Exam  BP 100/65 (BP Location: Right Arm)   Pulse (!) 51   Temp 97.6 F (36.4 C) (Oral)   Resp 20   SpO2 92%  Physical Exam General: calm, resting.  Lungs: breathing comfortably.  Psych: calm. Normal mood/affect.   ED Course / MDM   I have reviewed the labs performed to date as well as medications administered while in observation.  Recent changes in the last 24 hours include ED obs, reassessment.   Plan  It appears pt with chronic, recurrent stressors related to ongoing housing instability, disability, and cocaine use, and related mood disorder.   Patient has been accepted to [facility] geropsych unit, Dr Marlou Porch.  Pt appears stable for transport.     Cathren Laine, MD 01/08/23 [phone]

## 2023-01-08 NOTE — Group Note (Signed)
Recreation Therapy Group Note   Group Topic:Stress Management  Group Date: 01/08/2023 Start Time: 1400 End Time: 1500 Facilitators: Rosina Lowenstein, LRT, CTRS Location:  Dayroom  Group description: Minute To Win It. LRT and pts played 3 minute to win it games. The first one being that they had to list out loud as many presidents as they could, "name that tune" where they listened to a small clip of a song and had to guess the name of it, and lastly cup stack. For cup stack, each pt is given 6 foam cups. They are instructed to make 1 pyramid while using all 6 cups using both hands. Second round, they only use their right hand to make a pyramid and the third round, they only use their left hand. LRT and pts discussed the physical and mental symptoms of being under stress or under pressure. LRT and pts discussed how this can be used post discharge.   Goal Area(s) Addressed: Patient will identify physical symptoms of stress. Patient will identify emotional symptoms of stress. Patient will build frustration tolerance skills.   Affect/Mood: N/A   Participation Level: Did not attend    Clinical Observations/Individualized Feedback: Patient did not attend group.  Plan: Continue to engage patient in RT group sessions 2-3x/week.   [address] LRT, CTRS 01/08/2023 4:11 PM

## 2023-01-08 NOTE — Group Note (Unsigned)
Date:  01/08/2023 Time:  2:50 PM  Group Topic/Focus:  movement     Participation Level:  {[facility] PARTICIPATION ZOXWR:60454}  Participation Quality:  {[facility] PARTICIPATION QUALITY:22265}  Affect:  {[facility] AFFECT:22266}  Cognitive:  {[facility] COGNITIVE:22267}  Insight: {BHH Insight2:20797}  Engagement in Group:  {BHH ENGAGEMENT IN UJWJX:91478}  Modes of Intervention:  {BHH MODES OF INTERVENTION:22269}  Additional Comments:  ***  Rodena Goldmann 01/08/2023, 2:50 PM

## 2023-01-08 NOTE — H&P (Signed)
Psychiatric Admission Assessment Adult  Patient Identification: Jeffery Gross MRN:  [id number] Date of Evaluation:  01/08/2023 Chief Complaint:  MDD (major depressive disorder), recurrent severe, without psychosis (HCC) [F33.2] Principal Diagnosis: MDD (major depressive disorder), recurrent severe, without psychosis (HCC) Diagnosis:  Principal Problem:   MDD (major depressive disorder), recurrent severe, without psychosis (HCC) Active Problems:   S/P AKA (above knee amputation) bilateral (HCC)   Chronic low back pain   Cocaine abuse (HCC)   Opiate dependence (HCC)   Homelessness  History of Present Illness: Patient seen and chart reviewed.  62 year old man who presented to [facility] saying he had suicidal ideation.  He mentioned thinking that he would overdose and then put a bag over his head.  Describes mood as being depressed.  Feels hopeless about his situation.  He [place] the woods because he has no place to stay.  Using crack cocaine regularly.  Has not been taking medicine for quite a while specifically has been out of his narcotics.  Patient has multiple medical and social problems.  He has bilateral above-the-knee amputations and is wheelchair dependent.  Family is estranged from him and not letting him stay there.  He does get SSI although it is unclear what he does with his check.  Patient is not presenting as psychotic.  Primarily focused on homelessness and pain.  Chronic pain issues chronic use of narcotics.  It appears that he had been seeing a provider in [place] who had been giving him pretty high doses of narcotics for a while but recently ran out or cut him off and so now he has had several hospitalizations. Associated Signs/Symptoms: Depression Symptoms:  depressed mood, anhedonia, hopelessness, suicidal thoughts with specific plan, (Hypo) Manic Symptoms:  Impulsivity, Irritable Mood, Anxiety Symptoms:  Excessive Worry, Psychotic Symptoms:    None PTSD Symptoms: Negative Total Time spent with patient: 45 minutes  Past Psychiatric History: Multiple psychiatric hospitalizations especially recently.  He has been to [facility] in [place].  At both of them it sounds like he was put on antidepressants and then discharged to a situation that he did not think was adequate.  He says that he has had suicide attempts by overdose in the past.  Is the patient at risk to self? Yes.    Has the patient been a risk to self in the past 6 months? Yes.    Has the patient been a risk to self within the distant past? Yes.    Is the patient a risk to others? No.  Has the patient been a risk to others in the past 6 months? No.  Has the patient been a risk to others within the distant past? No.   [place] Scale:  Flowsheet Row Admission (Current) from 01/08/2023 in [facility] BEHAVIORAL MEDICINE ED from 01/07/2023 in [facility] Emergency Department at [facility] ED from 12/04/2022 in [facility] Emergency Department at [facility]  C-SSRS RISK CATEGORY High Risk Moderate Risk High Risk        Prior Inpatient Therapy: Yes.   If yes, describe multiple especially recently unclear that they have been of any benefit Prior Outpatient Therapy: No. If yes, describe not following up with outpatient mental health or substance use care  Alcohol Screening: 1. How often do you have a drink containing alcohol?: Never 2. How many drinks containing alcohol do you have on a typical day when you are drinking?: 1 or 2 3. How often do  you have six or more drinks on one occasion?: Never AUDIT-C Score: 0 4. How often during the last year have you found that you were not able to stop drinking once you had started?: Never 5. How often during the last year have you failed to do what was normally expected from you because of drinking?: Never 6. How often during the last year have you needed a first drink in the morning to get yourself going after  a heavy drinking session?: Never 7. How often during the last year have you had a feeling of guilt of remorse after drinking?: Never 8. How often during the last year have you been unable to remember what happened the night before because you had been drinking?: Never 9. Have you or someone else been injured as a result of your drinking?: No 10. Has a relative or friend or a doctor or another health worker been concerned about your drinking or suggested you cut down?: No Alcohol Use Disorder Identification Test Final Score (AUDIT): 0 Substance Abuse History in the last 12 months:  Yes.   Consequences of Substance Abuse: Cocaine abuse as well as opiate dependence contributing to social problems lack of resources mood instability ups and downs poor self-care and depression Previous Psychotropic Medications: Yes  Psychological Evaluations: Yes  Past Medical History:  Past Medical History:  Diagnosis Date   Back pain    Chest pain    Depression    DVT (deep venous thrombosis) (HCC)    GERD (gastroesophageal reflux disease)    History of kidney stones    passed   HTN (hypertension)    no longer has HTN    Past Surgical History:  Procedure Laterality Date   AMPUTATION Bilateral 01/09/2016   Procedure: Bilateral Above Knee Amputation, Apply Wound VAC;  Surgeon: Nadara Mustard, MD;  [facility]: [facility] OR;  Service: Orthopedics;  Laterality: Bilateral;   AMPUTATION Right 08/09/2022   Procedure: RIGHT REVISION AMPUTATION ABOVE KNEE;  Surgeon: Nadara Mustard, MD;  Location: [facility] OR;  Service: Orthopedics;  Laterality: Right;   FASCIOTOMY Bilateral 01/03/2016   Procedure: FASCIOTOMY;  Surgeon: Tarry Kos, MD;  Location: [facility] OR;  Service: Orthopedics;  Laterality: Bilateral;   I & D EXTREMITY Bilateral 01/05/2016   Procedure: IRRIGATION AND DEBRIDEMENT BILATERAL LOWER EXTREMITIES; WOUND VAC CHANGE;  Surgeon: Tarry Kos, MD;  Location: [facility] OR;  Service: Orthopedics;  Laterality: Bilateral;   I & D EXTREMITY  Left 01/07/2016   Procedure: Irrigation and Debridement of Left Lower Extremity with Application of Wound Vac;  Surgeon: Tarry Kos, MD;  Location: [facility] OR;  Service: Orthopedics;  Laterality: Left;   STUMP REVISION Right 07/04/2022   Procedure: REVISION RIGHT ABOVE KNEE AMPUTATION;  Surgeon: Nadara Mustard, MD;  Location: [facility] OR;  Service: Orthopedics;  Laterality: Right;   VASECTOMY     Family History:  Family History  Problem Relation Age of Onset   Hypertension Father    Family Psychiatric  History: Denies Tobacco Screening:  Social History   Tobacco Use  Smoking Status Every Day   Packs/day: 0.25   Years: 5.00   Additional pack years: 0.00   Total pack years: 1.25   Types: Cigarettes  Smokeless Tobacco Current    BH Tobacco Counseling     Are you interested in Tobacco Cessation Medications?  Yes, implement Nicotene Replacement Protocol Counseled patient on smoking cessation:  Yes Reason Tobacco Screening Not Completed: No value filed.  Social History:  Social History   Substance and Sexual Activity  Alcohol Use Yes   Comment: rare     Social History   Substance and Sexual Activity  Drug Use Yes   Types: Cocaine    Additional Social History:                           Allergies:   Allergies  Allergen Reactions   Sulfacetamide Sodium Swelling    Edema    Lab Results:  Results for orders placed or performed during the hospital encounter of 01/07/23 (from the past 48 hour(s))  Urinalysis, Routine w reflex microscopic -Urine, Clean Catch     Status: Abnormal   Collection Time: 01/07/23  5:08 AM  Result Value Ref Range   Color, Urine YELLOW YELLOW   APPearance CLEAR CLEAR   Specific Gravity, Urine 1.016 1.005 - 1.030   pH 5.0 5.0 - 8.0   Glucose, UA NEGATIVE NEGATIVE mg/dL   Hgb urine dipstick NEGATIVE NEGATIVE   Bilirubin Urine NEGATIVE NEGATIVE   Ketones, ur NEGATIVE NEGATIVE mg/dL   Protein, ur NEGATIVE NEGATIVE mg/dL   Nitrite  NEGATIVE NEGATIVE   Leukocytes,Ua TRACE (A) NEGATIVE   RBC / HPF 0-5 0 - 5 RBC/hpf   WBC, UA 0-5 0 - 5 WBC/hpf   Bacteria, UA NONE SEEN NONE SEEN   Squamous Epithelial / HPF 0-5 0 - 5 /HPF   Mucus PRESENT     Comment: Performed at [facility], [address] [place], [place] [address]  Basic metabolic panel     Status: Abnormal   Collection Time: 01/07/23  5:28 AM  Result Value Ref Range   Sodium 136 135 - 145 mmol/L   Potassium 3.0 (L) 3.5 - 5.1 mmol/L   Chloride 106 98 - 111 mmol/L   CO2 23 22 - 32 mmol/L   Glucose, Bld 116 (H) 70 - 99 mg/dL    Comment: Glucose reference range applies only to samples taken after fasting for at least 8 hours.   BUN 22 8 - 23 mg/dL   Creatinine, Ser 1.91 0.61 - 1.24 mg/dL   Calcium 8.9 8.9 - 47.8 mg/dL   GFR, Estimated >29 >56 mL/min    Comment: (NOTE) Calculated using the CKD-EPI Creatinine Equation (2021)    Anion gap 7 5 - 15    Comment: Performed at [facility], [address], [place], [place] [address]  CBC with Differential     Status: Abnormal   Collection Time: 01/07/23  5:28 AM  Result Value Ref Range   WBC 10.7 (H) 4.0 - 10.5 K/uL   RBC 5.21 4.22 - 5.81 MIL/uL   Hemoglobin 14.0 13.0 - 17.0 g/dL   HCT 65.7 84.6 - 96.2 %   MCV 83.1 80.0 - 100.0 fL   MCH 26.9 26.0 - 34.0 pg   MCHC 32.3 30.0 - 36.0 g/dL   RDW 95.2 84.1 - 32.4 %   Platelets 230 150 - 400 K/uL   nRBC 0.0 0.0 - 0.2 %   Neutrophils Relative % 58 %   Neutro Abs 6.2 1.7 - 7.7 K/uL   Lymphocytes Relative 30 %   Lymphs Abs 3.2 0.7 - 4.0 K/uL   Monocytes Relative 8 %   Monocytes Absolute 0.9 0.1 - 1.0 K/uL   Eosinophils Relative 3 %   Eosinophils Absolute 0.3 0.0 - 0.5 K/uL   Basophils Relative 1 %   Basophils Absolute 0.1 0.0 -  0.1 K/uL   Immature Granulocytes 0 %   Abs Immature Granulocytes 0.02 0.00 - 0.07 K/uL    Comment: Performed at [facility], [address], [place], [place] [address]  Ethanol     Status: None   Collection Time: 01/07/23  5:28 AM  Result  Value Ref Range   Alcohol, Ethyl (B) <10 <10 mg/dL    Comment: (NOTE) Lowest detectable limit for serum alcohol is 10 mg/dL.  For medical purposes only. Performed at [facility], [address], [place], [place] [address]   Urine rapid drug screen (hosp performed)     Status: Abnormal   Collection Time: 01/07/23  6:00 AM  Result Value Ref Range   Opiates NONE DETECTED NONE DETECTED   Cocaine POSITIVE (A) NONE DETECTED   Benzodiazepines NONE DETECTED NONE DETECTED   Amphetamines NONE DETECTED NONE DETECTED   Tetrahydrocannabinol NONE DETECTED NONE DETECTED   Barbiturates NONE DETECTED NONE DETECTED    Comment: (NOTE) DRUG SCREEN FOR MEDICAL PURPOSES ONLY.  IF CONFIRMATION IS NEEDED FOR ANY PURPOSE, NOTIFY LAB WITHIN 5 DAYS.  LOWEST DETECTABLE LIMITS FOR URINE DRUG SCREEN Drug Class                     Cutoff (ng/mL) Amphetamine and metabolites    1000 Barbiturate and metabolites    200 Benzodiazepine                 200 Opiates and metabolites        300 Cocaine and metabolites        300 THC                            50 Performed at [facility], [address] [place], [place] [address]     Blood Alcohol level:  Lab Results  Component Value Date   [facility] <10 01/07/2023   ETH <10 12/04/2022    Metabolic Disorder Labs:  Lab Results  Component Value Date   HGBA1C 5.8 (H) 06/14/2022   MPG 120 06/14/2022   No results found for: "PROLACTIN" No results found for: "CHOL", "TRIG", "HDL", "CHOLHDL", "VLDL", "LDLCALC"  Current Medications: Current Facility-Administered Medications  Medication Dose Route Frequency Provider Last Rate Last Admin   acetaminophen (TYLENOL) tablet 650 mg  650 mg Oral Q6H PRN Rankin, Shuvon B, NP       alum & mag hydroxide-simeth (MAALOX/MYLANTA) 200-200-20 MG/5ML suspension 30 mL  30 mL Oral Q4H PRN Rankin, Shuvon B, NP       diphenhydrAMINE (BENADRYL) capsule 50 mg  50 mg Oral TID PRN Rankin, Shuvon B, NP       Or   diphenhydrAMINE  (BENADRYL) injection 50 mg  50 mg Intramuscular TID PRN Rankin, Shuvon B, NP       DULoxetine (CYMBALTA) DR capsule 60 mg  60 mg Oral q AM Rankin, Shuvon B, NP   60 mg at 01/08/23 1022   gabapentin (NEURONTIN) capsule 300 mg  300 mg Oral TID Rankin, Shuvon B, NP   300 mg at 01/08/23 1022   haloperidol (HALDOL) tablet 5 mg  5 mg Oral TID PRN Rankin, Shuvon B, NP       Or   haloperidol lactate (HALDOL) injection 5 mg  5 mg Intramuscular TID PRN Rankin, Shuvon B, NP       magnesium hydroxide (MILK OF MAGNESIA) suspension 30 mL  30 mL Oral Daily PRN Rankin, Shuvon B, NP  nicotine (NICODERM CQ - dosed in mg/24 hours) patch 14 mg  14 mg Transdermal Q24H Brandonlee Navis, Jackquline Denmark, MD   14 mg at 01/08/23 1227   [START ON 01/09/2023] Vitamin D (Ergocalciferol) (DRISDOL) 1.25 MG (50000 UNIT) capsule 50,000 Units  50,000 Units Oral Q Fri Rankin, Shuvon B, NP       PTA Medications: Medications Prior to Admission  Medication Sig Dispense Refill Last Dose   doxycycline (VIBRA-TABS) 100 MG tablet Take 1 tablet (100 mg total) by mouth 2 (two) times daily. 28 tablet 0    DULoxetine (CYMBALTA) 60 MG capsule Take 60 mg by mouth in the morning.      gabapentin (NEURONTIN) 300 MG capsule Take 300 mg by mouth 3 (three) times daily.      omeprazole (PRILOSEC) 20 MG capsule Take 20 mg by mouth in the morning.       Musculoskeletal: Strength & Muscle Tone: Unremarkable Gait & Station: unable to stand Patient leans: N/A            Psychiatric Specialty Exam:  Presentation  General Appearance:  Appropriate for Environment  Eye Contact: Fair  Speech: Clear and Coherent  Speech Volume: Increased  Handedness:No data recorded  Mood and Affect  Mood: Dysphoric  Affect: Congruent   Thought Process  Thought Processes: Coherent; Goal Directed  Duration of Psychotic Symptoms:N/A Past Diagnosis of Schizophrenia or Psychoactive disorder: No  Descriptions of  Associations:Intact  Orientation:Full (Time, Place and Person)  Thought Content:Logical  Hallucinations:No data recorded Ideas of Reference:None  Suicidal Thoughts:No data recorded Homicidal Thoughts:No data recorded  Sensorium  Memory: Immediate Fair; Recent Fair; Remote Fair  Judgment: Poor  Insight: Poor   Executive Functions  Concentration: Fair  Attention Span: Fair  Recall: [employer] of Knowledge: Fair  Language: Fair   Psychomotor Activity  Psychomotor Activity:No data recorded  Assets  Assets: Communication Skills; Desire for Improvement   Sleep  Sleep:No data recorded   Physical Exam: Physical Exam Vitals reviewed.  Constitutional:      Appearance: Normal appearance.  HENT:     Head: Normocephalic and atraumatic.     Mouth/Throat:     Pharynx: Oropharynx is clear.  Eyes:     Pupils: Pupils are equal, round, and reactive to light.  Cardiovascular:     Rate and Rhythm: Normal rate and regular rhythm.  Pulmonary:     Effort: Pulmonary effort is normal.     Breath sounds: Normal breath sounds.  Abdominal:     General: Abdomen is flat.     Palpations: Abdomen is soft.  Musculoskeletal:        General: Normal range of motion.     Comments: Bilateral above-the-knee amputations both legs  Skin:    General: Skin is warm and dry.  Neurological:     General: No focal deficit present.     Mental Status: He is alert. Mental status is at baseline.  Psychiatric:        Attention and Perception: Attention normal.        Mood and Affect: Mood is anxious.        Speech: Speech normal.        Behavior: Behavior is cooperative.        Thought Content: Thought content normal.        Cognition and Memory: Cognition normal.        Judgment: Judgment is impulsive.    Review of Systems  Constitutional: Negative.   HENT: Negative.  Eyes: Negative.   Respiratory: Negative.    Cardiovascular: Negative.   Gastrointestinal: Negative.    Musculoskeletal: Negative.   Skin: Negative.   Neurological: Negative.   Psychiatric/Behavioral:  Positive for depression, substance abuse and suicidal ideas. The patient is nervous/anxious.    Blood pressure 120/80, pulse 64, temperature 98 F (36.7 C), resp. rate 16, weight 64.9 kg, SpO2 98 %. Body mass index is 18.36 kg/m.  Treatment Plan Summary: Daily contact with patient to assess and evaluate symptoms and progress in treatment, Medication management, and Plan first of all patient is very focused on his narcotics.  I could not tell whether they were giving him narcotics in the emergency room in [place] but it certainly seems like he has not had any for at least a month and is unlikely to have an outpatient provider who will continue them.  By his report he had not had any for a couple days at least maybe more than that before he came into the emergency room.  He was able to get out of bed and move around in his wheelchair all without seeming to be in obvious distress.  I do not see any indication for restarting narcotics.  We will not restart narcotic pain medicine at this time.  He is on Cymbalta for depression and pain and gabapentin both of which can be continued.  Labs reviewed.  He had a low potassium and so we will recheck that.  Patient's primary issue that is keeping him depressed seems to be his homelessness.  This is going to be a challenge.  We will have to look into how much money he gets from his SSI and what might possibly be available.  Observation Level/Precautions:  15 minute checks  Laboratory:  Chemistry Profile  Psychotherapy:    Medications:    Consultations:    Discharge Concerns:    Estimated LOS:  Other:     Physician Treatment Plan for Primary Diagnosis: MDD (major depressive disorder), recurrent severe, without psychosis (HCC) Long Term Goal(s): Improvement in symptoms so as ready for discharge  Short Term Goals: Ability to disclose and discuss suicidal  ideas and Ability to demonstrate self-control will improve  Physician Treatment Plan for Secondary Diagnosis: Principal Problem:   MDD (major depressive disorder), recurrent severe, without psychosis (HCC) Active Problems:   S/P AKA (above knee amputation) bilateral (HCC)   Chronic low back pain   Cocaine abuse (HCC)   Opiate dependence (HCC)   Homelessness  Long Term Goal(s): Improvement in symptoms so as ready for discharge  Short Term Goals: Ability to maintain clinical measurements within normal limits will improve  I certify that inpatient services furnished can reasonably be expected to improve the patient's condition.    Mordecai Rasmussen, MD 6/20/20244:18 PM

## 2023-01-09 DIAGNOSIS — L03211 Cellulitis of face: Secondary | ICD-10-CM

## 2023-01-09 DIAGNOSIS — F332 Major depressive disorder, recurrent severe without psychotic features: Secondary | ICD-10-CM | POA: Diagnosis not present

## 2023-01-09 LAB — CBC WITH DIFFERENTIAL/PLATELET
Abs Immature Granulocytes: 0.02 10*3/uL (ref 0.00–0.07)
Basophils Absolute: 0 10*3/uL (ref 0.0–0.1)
Basophils Relative: 0 %
Eosinophils Absolute: 0.4 10*3/uL (ref 0.0–0.5)
Eosinophils Relative: 5 %
HCT: 41.4 % (ref 39.0–52.0)
Hemoglobin: 13.1 g/dL (ref 13.0–17.0)
Immature Granulocytes: 0 %
Lymphocytes Relative: 29 %
Lymphs Abs: 2.3 10*3/uL (ref 0.7–4.0)
MCH: 26.7 pg (ref 26.0–34.0)
MCHC: 31.6 g/dL (ref 30.0–36.0)
MCV: 84.5 fL (ref 80.0–100.0)
Monocytes Absolute: 0.3 10*3/uL (ref 0.1–1.0)
Monocytes Relative: 4 %
Neutro Abs: 4.8 10*3/uL (ref 1.7–7.7)
Neutrophils Relative %: 62 %
Platelets: 196 10*3/uL (ref 150–400)
RBC: 4.9 MIL/uL (ref 4.22–5.81)
RDW: 14.6 % (ref 11.5–15.5)
WBC: 7.8 10*3/uL (ref 4.0–10.5)
nRBC: 0 % (ref 0.0–0.2)

## 2023-01-09 MED ORDER — DOXYCYCLINE HYCLATE 100 MG PO TABS
100.0000 mg | ORAL_TABLET | Freq: Two times a day (BID) | ORAL | Status: DC
  Administered 2023-01-09 – 2023-01-20 (×23): 100 mg via ORAL
  Filled 2023-01-09 (×24): qty 1

## 2023-01-09 MED ORDER — OXYCODONE HCL 5 MG PO TABS
10.0000 mg | ORAL_TABLET | Freq: Four times a day (QID) | ORAL | Status: DC
  Administered 2023-01-09 – 2023-01-20 (×45): 10 mg via ORAL
  Filled 2023-01-09 (×45): qty 2

## 2023-01-09 NOTE — Consult Note (Signed)
Initial Consultation Note   Patient: Jeffery Gross YQM:[id number] DOB: 05-Oct-1960 PCP: Salli Real, MD DOA: 01/08/2023 DOS: the patient was seen and examined on 01/09/2023 Primary service: [facility]  Referring physician: Clapacs  Reason for consult: Facial Cellulitis   Assessment/Plan: Assessment and Plan: Facial cellulitis Positive mild to moderate right-sided facial cellulitis with small component of?  Abscess formation Doxycycline for infectious coverage Will place on schedule hot warm compresses for spontaneous drainage Plan of care discussed with Dr. Toni Amend Will otherwise sign off for now Reassess if symptoms fail to improve or worsen       TRH will sign off at present, please call us again when needed.  HPI: Jeffery Gross is a 62 y.o. male with past medical history of GERD, depression,.  History of bilateral AKA currently admitted to behavioral health unit.  We have been asked to evaluate facial cellulitis.  Patient reports being diagnosed with facial cellulitis roughly 3 to 4 weeks ago.  Patient states he was given a course of doxycycline to which she did not complete.  Has had recurrence of redness over the past few days.  No fevers or chills.  No nausea or vomiting.  No chest pain or shortness of breath.  Does report some generalized chronic pain which is not a new issue.  No hemiparesis or confusion.  No purulent drainage. Currently at behavioral facility afebrile, hemodynamically stable.  BMP today within normal limits.  CBC pending.  Review of Systems: As mentioned in the history of present illness. All other systems reviewed and are negative. Past Medical History:  Diagnosis Date   Back pain    Chest pain    Depression    DVT (deep venous thrombosis) (HCC)    GERD (gastroesophageal reflux disease)    History of kidney stones    passed   HTN (hypertension)    no longer has HTN   Past Surgical History:  Procedure Laterality Date   AMPUTATION  Bilateral 01/09/2016   Procedure: Bilateral Above Knee Amputation, Apply Wound VAC;  Surgeon: Nadara Mustard, MD;  [facility] OR;  Service: Orthopedics;  Laterality: Bilateral;   AMPUTATION Right 08/09/2022   Procedure: RIGHT REVISION AMPUTATION ABOVE KNEE;  Surgeon: Nadara Mustard, MD;  Location: [facility] OR;  Service: Orthopedics;  Laterality: Right;   FASCIOTOMY Bilateral 01/03/2016   Procedure: FASCIOTOMY;  Surgeon: Tarry Kos, MD;  [facility]: [facility] OR;  Service: Orthopedics;  Laterality: Bilateral;   I & D EXTREMITY Bilateral 01/05/2016   Procedure: IRRIGATION AND DEBRIDEMENT BILATERAL LOWER EXTREMITIES; WOUND VAC CHANGE;  Surgeon: Tarry Kos, MD;  [facility] OR;  Service: Orthopedics;  Laterality: Bilateral;   I & D EXTREMITY Left 01/07/2016   Procedure: Irrigation and Debridement of Left Lower Extremity with Application of Wound Vac;  Surgeon: Tarry Kos, MD;  [facility] OR;  Service: Orthopedics;  Laterality: Left;   STUMP REVISION Right 07/04/2022   Procedure: REVISION RIGHT ABOVE KNEE AMPUTATION;  Surgeon: Nadara Mustard, MD;  Location: [facility] OR;  Service: Orthopedics;  Laterality: Right;   VASECTOMY     Social History:  reports that he has been smoking cigarettes. He has a 1.25 pack-year smoking history. He uses smokeless tobacco. He reports current alcohol use. He reports current drug use. Drug: Cocaine.  Allergies  Allergen Reactions   Sulfacetamide Sodium Swelling    Edema     Family History  Problem Relation Age of Onset   Hypertension Father  Prior to Admission medications   Medication Sig Start Date End Date Taking? Authorizing Provider  doxycycline (VIBRA-TABS) 100 MG tablet Take 1 tablet (100 mg total) by mouth 2 (two) times daily. 09/05/22   Adonis Huguenin, NP  DULoxetine (CYMBALTA) 60 MG capsule Take 60 mg by mouth in the morning.    [provider]  gabapentin (NEURONTIN) 300 MG capsule Take 300 mg by mouth 3 (three) times daily. 05/16/22   [provider]  omeprazole (PRILOSEC) 20 MG capsule Take 20 mg by mouth in the morning. 03/19/22   [provider]    Physical Exam: Vitals:   01/08/23 0934 01/08/23 1931 01/09/23 0716  BP: 120/80 115/68 127/73  Pulse: 64 80 69  Resp: 16 18 18   Temp: 98 F (36.7 C) 98.1 F (36.7 C) 98 F (36.7 C)  SpO2: 98% 98% 98%  Weight: 64.9 kg     Physical Exam Constitutional:      Appearance: He is normal weight.  HENT:     Head: Normocephalic.     Nose: Nose normal.     Mouth/Throat:     Mouth: Mucous membranes are moist.  Eyes:     Pupils: Pupils are equal, round, and reactive to light.  Cardiovascular:     Rate and Rhythm: Normal rate and regular rhythm.  Pulmonary:     Effort: Pulmonary effort is normal.  Abdominal:     General: Bowel sounds are normal.  Musculoskeletal:        General: Normal range of motion.  Skin:    General: Skin is warm.  Neurological:     General: No focal deficit present.  Psychiatric:        Mood and Affect: Mood normal.     Data Reviewed:   There are no new results to review at this time.    Family Communication: Patient made aware of plan  Primary team communication: Dr. Toni Amend made aware of plan  Thank you very much for involving [place] in the care of your patient.  Author: Floydene Flock, MD 01/09/2023 3:21 PM  For on call review [email or web address].

## 2023-01-09 NOTE — Assessment & Plan Note (Signed)
Positive mild to moderate right-sided facial cellulitis with small component of?  Abscess formation Doxycycline for infectious coverage Will place on schedule hot warm compresses for spontaneous drainage Plan of care discussed with Dr. Toni Amend Will otherwise sign off for now Reassess if symptoms fail to improve or worsen

## 2023-01-09 NOTE — BH IP Treatment Plan (Signed)
Interdisciplinary Treatment and Diagnostic Plan Update  01/09/2023 Time of Session: 10:14 AM  Jeffery Gross MRN: [id number]  Principal Diagnosis: MDD (major depressive disorder), recurrent severe, without psychosis (HCC)  Secondary Diagnoses: Principal Problem:   MDD (major depressive disorder), recurrent severe, without psychosis (HCC) Active Problems:   S/P AKA (above knee amputation) bilateral (HCC)   Chronic low back pain   Cocaine abuse (HCC)   Opiate dependence (HCC)   Homelessness   Facial cellulitis   Current Medications:  Current Facility-Administered Medications  Medication Dose Route Frequency Provider Last Rate Last Admin   acetaminophen (TYLENOL) tablet 650 mg  650 mg Oral Q6H PRN Rankin, Shuvon B, NP       alum & mag hydroxide-simeth (MAALOX/MYLANTA) 200-200-20 MG/5ML suspension 30 mL  30 mL Oral Q4H PRN Rankin, Shuvon B, NP       diphenhydrAMINE (BENADRYL) capsule 50 mg  50 mg Oral TID PRN Rankin, Shuvon B, NP       Or   diphenhydrAMINE (BENADRYL) injection 50 mg  50 mg Intramuscular TID PRN Rankin, Shuvon B, NP       doxycycline (VIBRA-TABS) tablet 100 mg  100 mg Oral Q12H Clapacs, John T, MD   100 mg at 01/09/23 1128   DULoxetine (CYMBALTA) DR capsule 60 mg  60 mg Oral q AM Rankin, Shuvon B, NP   60 mg at 01/09/23 0757   gabapentin (NEURONTIN) capsule 300 mg  300 mg Oral TID Rankin, Shuvon B, NP   300 mg at 01/09/23 0940   haloperidol (HALDOL) tablet 5 mg  5 mg Oral TID PRN Rankin, Shuvon B, NP       Or   haloperidol lactate (HALDOL) injection 5 mg  5 mg Intramuscular TID PRN Rankin, Shuvon B, NP       magnesium hydroxide (MILK OF MAGNESIA) suspension 30 mL  30 mL Oral Daily PRN Rankin, Shuvon B, NP       nicotine (NICODERM CQ - dosed in mg/24 hours) patch 14 mg  14 mg Transdermal Q24H Clapacs, John T, MD   14 mg at 01/09/23 2440   Vitamin D (Ergocalciferol) (DRISDOL) 1.25 MG (50000 UNIT) capsule 50,000 Units  50,000 Units Oral Q Fri Rankin, Shuvon B, NP   50,000  Units at 01/09/23 1027   PTA Medications: Medications Prior to Admission  Medication Sig Dispense Refill Last Dose   doxycycline (VIBRA-TABS) 100 MG tablet Take 1 tablet (100 mg total) by mouth 2 (two) times daily. 28 tablet 0    DULoxetine (CYMBALTA) 60 MG capsule Take 60 mg by mouth in the morning.      gabapentin (NEURONTIN) 300 MG capsule Take 300 mg by mouth 3 (three) times daily.      omeprazole (PRILOSEC) 20 MG capsule Take 20 mg by mouth in the morning.       Patient Stressors: Financial difficulties   Health problems    Patient Strengths: Capable of independent living  Communication skills   Treatment Modalities: Medication Management, Group therapy, Case management,  1 to 1 session with clinician, Psychoeducation, Recreational therapy.   Physician Treatment Plan for Primary Diagnosis: MDD (major depressive disorder), recurrent severe, without psychosis (HCC) Long Term Goal(s): Improvement in symptoms so as ready for discharge   Short Term Goals: Ability to maintain clinical measurements within normal limits will improve Ability to disclose and discuss suicidal ideas Ability to demonstrate self-control will improve  Medication Management: Evaluate patient's response, side effects, and tolerance of medication regimen.  Therapeutic Interventions:  1 to 1 sessions, Unit Group sessions and Medication administration.  Evaluation of Outcomes: Not Met  Physician Treatment Plan for Secondary Diagnosis: Principal Problem:   MDD (major depressive disorder), recurrent severe, without psychosis (HCC) Active Problems:   S/P AKA (above knee amputation) bilateral (HCC)   Chronic low back pain   Cocaine abuse (HCC)   Opiate dependence (HCC)   Homelessness   Facial cellulitis  Long Term Goal(s): Improvement in symptoms so as ready for discharge   Short Term Goals: Ability to maintain clinical measurements within normal limits will improve Ability to disclose and discuss suicidal  ideas Ability to demonstrate self-control will improve     Medication Management: Evaluate patient's response, side effects, and tolerance of medication regimen.  Therapeutic Interventions: 1 to 1 sessions, Unit Group sessions and Medication administration.  Evaluation of Outcomes: Not Met   RN Treatment Plan for Primary Diagnosis: MDD (major depressive disorder), recurrent severe, without psychosis (HCC) Long Term Goal(s): Knowledge of disease and therapeutic regimen to maintain health will improve  Short Term Goals: Ability to remain free from injury will improve, Ability to verbalize frustration and anger appropriately will improve, Ability to demonstrate self-control, Ability to participate in decision making will improve, Ability to verbalize feelings will improve, Ability to disclose and discuss suicidal ideas, Ability to identify and develop effective coping behaviors will improve, and Compliance with prescribed medications will improve  Medication Management: RN will administer medications as ordered by provider, will assess and evaluate patient's response and provide education to patient for prescribed medication. RN will report any adverse and/or side effects to prescribing provider.  Therapeutic Interventions: 1 on 1 counseling sessions, Psychoeducation, Medication administration, Evaluate responses to treatment, Monitor vital signs and CBGs as ordered, Perform/monitor CIWA, COWS, AIMS and Fall Risk screenings as ordered, Perform wound care treatments as ordered.  Evaluation of Outcomes: Not Met   LCSW Treatment Plan for Primary Diagnosis: MDD (major depressive disorder), recurrent severe, without psychosis (HCC) Long Term Goal(s): Safe transition to appropriate next level of care at discharge, Engage patient in therapeutic group addressing interpersonal concerns.  Short Term Goals: Engage patient in aftercare planning with referrals and resources, Increase social support,  Increase ability to appropriately verbalize feelings, Increase emotional regulation, Facilitate acceptance of mental health diagnosis and concerns, Facilitate patient progression through stages of change regarding substance use diagnoses and concerns, Identify triggers associated with mental health/substance abuse issues, and Increase skills for wellness and recovery  Therapeutic Interventions: Assess for all discharge needs, 1 to 1 time with Social worker, Explore available resources and support systems, Assess for adequacy in community support network, Educate family and significant other(s) on suicide prevention, Complete Psychosocial Assessment, Interpersonal group therapy.  Evaluation of Outcomes: Not Met   Progress in Treatment: Attending groups: No. Participating in groups: No. Taking medication as prescribed: Yes. Toleration medication: Yes. Family/Significant other contact made: Yes, individual(s) contacted:  Pt declined Patient understands diagnosis: Yes. Discussing patient identified problems/goals with staff: Yes. Medical problems stabilized or resolved: Yes. Denies suicidal/homicidal ideation: No. Issues/concerns per patient self-inventory: No. Other: None   New problem(s) identified: No, Describe:  none   New Short Term/Long Term Goal(s):  detox, elimination of symptoms of psychosis, medication management for mood stabilization; elimination of SI thoughts; development of comprehensive mental wellness/sobriety plan.  Patient Goals:  "Hoping I wouldn't be suicidal and hurting so bad"   Discharge Plan or Barriers: CSW will assist with appropriate discharge planning.   Reason for Continuation of Hospitalization: Depression Medication stabilization  Suicidal ideation  Estimated Length of Stay: 1 to 7 days   Last 3 [place] Suicide Severity Risk Score: Flowsheet Row Admission (Current) from 01/08/2023 in [facility] from 01[facility]07/2023 in [facility]  Emergency Department at [facility] ED from 12/04/2022 in [facility] Emergency Department at [facility]  C-SSRS RISK CATEGORY High Risk Moderate Risk High Risk       Last [facility] 2/9 Scores:    02/15/2016    1:54 PM 02/08/2016   11:08 AM  Depression screen PHQ 2/9  Decreased Interest 0 0  Down, Depressed, Hopeless 0 0  PHQ - 2 Score 0 0  Altered sleeping 1   Tired, decreased energy 0   Change in appetite 0   Feeling bad or failure about yourself  0   Trouble concentrating 0   Moving slowly or fidgety/restless 0   Suicidal thoughts 0   PHQ-9 Score 1   Difficult doing work/chores Not difficult at all     Scribe for Treatment Team: Elza Rafter, Theresia Majors 01/09/2023 3:31 PM

## 2023-01-09 NOTE — Group Note (Signed)
LCSW Group Therapy Note  Group Date: 01/09/2023 Start Time: 1315 End Time: 1400   Type of Therapy and Topic:  Group Therapy - How To Cope with Nervousness about Discharge   Participation Level:  Did Not Attend   Description of Group This process group involved identification of patients' feelings about discharge. Some of them are scheduled to be discharged soon, while others are new admissions, but each of them was asked to share thoughts and feelings surrounding discharge from the hospital. One common theme was that they are excited at the prospect of going home, while another was that many of them are apprehensive about sharing why they were hospitalized. Patients were given the opportunity to discuss these feelings with their peers in preparation for discharge.  Therapeutic Goals  Patient will identify their overall feelings about pending discharge. Patient will think about how they might proactively address issues that they believe will once again arise once they get home (i.e. with parents). Patients will participate in discussion about having hope for change.   Summary of Patient Progress:  X  Therapeutic Modalities Cognitive Behavioral Therapy   Claudie Fisherman 01/09/2023  3:27 PM

## 2023-01-09 NOTE — [facility] Suicide Risk Assessment (Signed)
[facility] INPATIENT:  Family/Significant Other Suicide Prevention Education  Suicide Prevention Education:  Patient Refusal for Family/Significant Other Suicide Prevention Education: The patient Jeffery Gross has refused to provide written consent for family/significant other to be provided Family/Significant Other Suicide Prevention Education during admission and/or prior to discharge.  Physician notified.  SPE completed with pt, as pt refused to consent to family contact. SPI pamphlet provided to pt and pt was encouraged to share information with support network, ask questions, and talk about any concerns relating to SPE. Pt denies access to guns/firearms and verbalized understanding of information provided. Mobile Crisis information also provided to pt.   Harden Mo 01/09/2023, 4:27 PM

## 2023-01-09 NOTE — Progress Notes (Signed)
[facility] MD Progress Note  01/09/2023 5:11 PM Jeffery Gross  MRN:  [id number] Subjective: Patient seen for follow-up.  Patient today is threatening to harm himself and was found with a string in a garment wrapped around his neck.  Says that we are ignoring his pain and not giving him his narcotic pain medicine. Principal Problem: MDD (major depressive disorder), recurrent severe, without psychosis (HCC) Diagnosis: Principal Problem:   MDD (major depressive disorder), recurrent severe, without psychosis (HCC) Active Problems:   S/P AKA (above knee amputation) bilateral (HCC)   Chronic low back pain   Cocaine abuse (HCC)   Opiate dependence (HCC)   Homelessness   Facial cellulitis  Total Time spent with patient: 30 minutes  Past Psychiatric History: Past history of longstanding drug abuse  Past Medical History:  Past Medical History:  Diagnosis Date   Back pain    Chest pain    Depression    DVT (deep venous thrombosis) (HCC)    GERD (gastroesophageal reflux disease)    History of kidney stones    passed   HTN (hypertension)    no longer has HTN    Past Surgical History:  Procedure Laterality Date   AMPUTATION Bilateral 01/09/2016   Procedure: Bilateral Above Knee Amputation, Apply Wound VAC;  Surgeon: Nadara Mustard, MD;  Location: [facility] OR;  Service: Orthopedics;  Laterality: Bilateral;   AMPUTATION Right 08/09/2022   Procedure: RIGHT REVISION AMPUTATION ABOVE KNEE;  Surgeon: Nadara Mustard, MD;  Location: [facility] OR;  Service: Orthopedics;  Laterality: Right;   FASCIOTOMY Bilateral 01/03/2016   Procedure: FASCIOTOMY;  Surgeon: Tarry Kos, MD;  Location: [facility] OR;  Service: Orthopedics;  Laterality: Bilateral;   I & D EXTREMITY Bilateral 01/05/2016   Procedure: IRRIGATION AND DEBRIDEMENT BILATERAL LOWER EXTREMITIES; WOUND VAC CHANGE;  Surgeon: Tarry Kos, MD;  Location: [facility] OR;  Service: Orthopedics;  Laterality: Bilateral;   I & D EXTREMITY Left 01/07/2016   Procedure: Irrigation and  Debridement of Left Lower Extremity with Application of Wound Vac;  Surgeon: Tarry Kos, MD;  Location: [facility] OR;  Service: Orthopedics;  Laterality: Left;   STUMP REVISION Right 07/04/2022   Procedure: REVISION RIGHT ABOVE KNEE AMPUTATION;  Surgeon: Nadara Mustard, MD;  Location[facility] OR;  Service: Orthopedics;  Laterality: Right;   VASECTOMY     Family History:  Family History  Problem Relation Age of Onset   Hypertension Father    Family Psychiatric  History: See previous Social History:  Social History   Substance and Sexual Activity  Alcohol Use Yes   Comment: rare     Social History   Substance and Sexual Activity  Drug Use Yes   Types: Cocaine    Social History   Socioeconomic History   Marital status: Single    Spouse name: Not on file   Number of children: 3   Years of education: Not on file   Highest education level: Not on file  Occupational History   Occupation: disabled  Tobacco Use   Smoking status: Every Day    Packs/day: 0.25    Years: 5.00    Additional pack years: 0.00    Total pack years: 1.25    Types: Cigarettes   Smokeless tobacco: Current  Vaping Use   Vaping Use: Every day   Substances: Nicotine  Substance and Sexual Activity   Alcohol use: Yes    Comment: rare   Drug use: Yes    Types: Cocaine  Sexual activity: Not on file  Other Topics Concern   Not on file  Social History Narrative   Not on file   Social Determinants of Health   Financial Resource Strain: Not on file  Food Insecurity: Food Insecurity Present (01/08/2023)   Hunger Vital Sign    Worried About Running Out of Food in the Last Year: Sometimes true    Ran Out of Food in the Last Year: Sometimes true  Transportation Needs: No Transportation Needs (01/08/2023)   PRAPARE - Administrator, Civil Service (Medical): No    Lack of Transportation (Non-Medical): No  Physical Activity: Not on file  Stress: Not on file  Social Connections: Not on file    Additional Social History:                         Sleep: Fair  Appetite:  Fair  Current Medications: Current Facility-Administered Medications  Medication Dose Route Frequency Provider Last Rate Last Admin   acetaminophen (TYLENOL) tablet 650 mg  650 mg Oral Q6H PRN Rankin, Shuvon B, NP       alum & mag hydroxide-simeth (MAALOX/MYLANTA) 200-200-20 MG/5ML suspension 30 mL  30 mL Oral Q4H PRN Rankin, Shuvon B, NP       diphenhydrAMINE (BENADRYL) capsule 50 mg  50 mg Oral TID PRN Rankin, Shuvon B, NP       Or   diphenhydrAMINE (BENADRYL) injection 50 mg  50 mg Intramuscular TID PRN Rankin, Shuvon B, NP       doxycycline (VIBRA-TABS) tablet 100 mg  100 mg Oral Q12H Zebulon Gantt T, MD   100 mg at 01/09/23 1128   DULoxetine (CYMBALTA) DR capsule 60 mg  60 mg Oral q AM Rankin, Shuvon B, NP   60 mg at 01/09/23 0757   gabapentin (NEURONTIN) capsule 300 mg  300 mg Oral TID Rankin, Shuvon B, NP   300 mg at 01/09/23 1621   haloperidol (HALDOL) tablet 5 mg  5 mg Oral TID PRN Rankin, Shuvon B, NP       Or   haloperidol lactate (HALDOL) injection 5 mg  5 mg Intramuscular TID PRN Rankin, Shuvon B, NP       magnesium hydroxide (MILK OF MAGNESIA) suspension 30 mL  30 mL Oral Daily PRN Rankin, Shuvon B, NP       nicotine (NICODERM CQ - dosed in mg/24 hours) patch 14 mg  14 mg Transdermal Q24H Smokey Melott T, MD   14 mg at 01/09/23 1610   oxyCODONE (Oxy IR/ROXICODONE) immediate release tablet 10 mg  10 mg Oral Q6H Kamaryn Grimley, Jackquline Denmark, MD       Vitamin D (Ergocalciferol) (DRISDOL) 1.25 MG (50000 UNIT) capsule 50,000 Units  50,000 Units Oral Q Fri Rankin, Shuvon B, NP   50,000 Units at 01/09/23 9604    Lab Results:  Results for orders placed or performed during the hospital encounter of 01/08/23 (from the past 48 hour(s))  Basic metabolic panel     Status: Abnormal   Collection Time: 01/08/23  4:35 PM  Result Value Ref Range   Sodium 141 135 - 145 mmol/L   Potassium 4.4 3.5 - 5.1 mmol/L    Chloride 112 (H) 98 - 111 mmol/L   CO2 22 22 - 32 mmol/L   Glucose, Bld 120 (H) 70 - 99 mg/dL    Comment: Glucose reference range applies only to samples taken after fasting for at least 8 hours.  BUN 21 8 - 23 mg/dL   Creatinine, Ser 9.52 0.61 - 1.24 mg/dL   Calcium 8.8 (L) 8.9 - 10.3 mg/dL   GFR, Estimated >84 >13 mL/min    Comment: (NOTE) Calculated using the CKD-EPI Creatinine Equation (2021)    Anion gap 7 5 - 15    Comment: Performed at [facility], [address] [place], [place] [address]  CBC with Differential/Platelet     Status: None   Collection Time: 01/09/23  3:40 PM  Result Value Ref Range   WBC 7.8 4.0 - 10.5 K/uL   RBC 4.90 4.22 - 5.81 MIL/uL   Hemoglobin 13.1 13.0 - 17.0 g/dL   HCT 02.7 25.3 - 66.4 %   MCV 84.5 80.0 - 100.0 fL   MCH 26.7 26.0 - 34.0 pg   MCHC 31.6 30.0 - 36.0 g/dL   RDW 40.3 47.4 - 25.9 %   Platelets 196 150 - 400 K/uL   nRBC 0.0 0.0 - 0.2 %   Neutrophils Relative % 62 %   Neutro Abs 4.8 1.7 - 7.7 K/uL   Lymphocytes Relative 29 %   Lymphs Abs 2.3 0.7 - 4.0 K/uL   Monocytes Relative 4 %   Monocytes Absolute 0.3 0.1 - 1.0 K/uL   Eosinophils Relative 5 %   Eosinophils Absolute 0.4 0.0 - 0.5 K/uL   Basophils Relative 0 %   Basophils Absolute 0.0 0.0 - 0.1 K/uL   Immature Granulocytes 0 %   Abs Immature Granulocytes 0.02 0.00 - 0.07 K/uL    Comment: Performed at [facility], [address] [place], [place] [address]    Blood Alcohol level:  Lab Results  Component Value Date   [facility] <10 01/07/2023   ETH <10 12/04/2022    Metabolic Disorder Labs: Lab Results  Component Value Date   HGBA1C 5.8 (H) 06/14/2022   MPG 120 06/14/2022   No results found for: "PROLACTIN" No results found for: "CHOL", "TRIG", "HDL", "CHOLHDL", "VLDL", "LDLCALC"  Physical Findings: AIMS:  , ,  ,  ,    CIWA:    COWS:     Musculoskeletal: Strength & Muscle Tone: within normal limits Gait & Station: normal Patient leans:  N/A  Psychiatric Specialty Exam:  Presentation  General Appearance:  Appropriate for Environment  Eye Contact: Fair  Speech: Clear and Coherent  Speech Volume: Increased  Handedness:No data recorded  Mood and Affect  Mood: Dysphoric  Affect: Congruent   Thought Process  Thought Processes: Coherent; Goal Directed  Descriptions of Associations:Intact  Orientation:Full (Time, Place and Person)  Thought Content:Logical  History of Schizophrenia/Schizoaffective disorder:No  Duration of Psychotic Symptoms:No data recorded Hallucinations:No data recorded Ideas of Reference:None  Suicidal Thoughts:No data recorded Homicidal Thoughts:No data recorded  Sensorium  Memory: Immediate Fair; Recent Fair; Remote Fair  Judgment: Poor  Insight: Poor   Executive Functions  Concentration: Fair  Attention Span: Fair  Recall: [employer] of Knowledge: Fair  Language: Fair   Psychomotor Activity  Psychomotor Activity:No data recorded  Assets  Assets: Communication Skills; Desire for Improvement   Sleep  Sleep:No data recorded   Physical Exam: Physical Exam Constitutional:      Appearance: Normal appearance.  HENT:     Head: Normocephalic and atraumatic.     Mouth/Throat:     Pharynx: Oropharynx is clear.  Eyes:     Pupils: Pupils are equal, round, and reactive to light.  Cardiovascular:     Rate and Rhythm: Normal rate and regular  rhythm.  Pulmonary:     Effort: Pulmonary effort is normal.     Breath sounds: Normal breath sounds.  Abdominal:     General: Abdomen is flat.     Palpations: Abdomen is soft.  Musculoskeletal:        General: Normal range of motion.     Comments: Bilateral above-the-knee amputations  Skin:    General: Skin is warm and dry.  Neurological:     General: No focal deficit present.     Mental Status: He is alert. Mental status is at baseline.  Psychiatric:        Attention and Perception: Attention normal.         Mood and Affect: Mood normal.        Speech: Speech normal.        Behavior: Behavior normal.        Thought Content: Thought content normal.        Cognition and Memory: Cognition normal.    Review of Systems  Constitutional: Negative.   HENT: Negative.    Eyes: Negative.   Respiratory: Negative.    Cardiovascular: Negative.   Gastrointestinal: Negative.   Musculoskeletal:  Positive for back pain.  Skin: Negative.   Neurological: Negative.   Psychiatric/Behavioral:  Positive for depression and suicidal ideas.    Blood pressure 127/73, pulse 69, temperature 98 F (36.7 C), resp. rate 18, weight 64.9 kg, SpO2 98 %. Body mass index is 18.36 kg/m.   Treatment Plan Summary: Medication management and Plan I spoke with the patient and explained to him that his chronic pain really was not an appropriate use of narcotic pain medication and that I was also concerned about him not having it available after discharge.  Nevertheless the patient continues to threaten to self-harm if not given pain medicine.  I will restart him on oxycodone 10 mg 4 times a day for now.  Case reviewed with nursing.  He continued to threaten even after being told pain medicine was being started and we are going to have to put him on one-to-one for now which can be reassessed later  Mordecai Rasmussen, MD 01/09/2023, 5:11 PM
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
## 2023-01-09 NOTE — Group Note (Signed)
Recreation Therapy Group Note   Group Topic:Leisure Education  Group Date: 01/09/2023 Start Time: 1400 End Time: 1500 Facilitators: Rosina Lowenstein, LRT, CTRS Location:  Day Room  Group Description: Leisure. Patients were given the option to choose from coloring, singing karaoke, or playing cards. LRT and pts discussed the meaning of leisure, the importance of participating in leisure during their free time/when they're outside of the hospital, as well as how our leisure interests can also serve as coping skills. Pt identified two leisure interests and shared with the group.   Goal Area(s) Addressed:  Patient will identify a current leisure interest.  Patient will learn the definition of "leisure". Patient will practice making a positive decision. Patient will have the opportunity to try a new leisure activity. Patient will communicate with peers and LRT.   Affect/Mood: N/A   Participation Level: Did not attend    Clinical Observations/Individualized Feedback: Jeffery Gross did not attend group despite encouragement.   Plan: Continue to engage patient in RT group sessions 2-3x/week.   Rosina Lowenstein, LRT, CTRS 01/09/2023 3:20 PM

## 2023-01-09 NOTE — Progress Notes (Addendum)
Patient is alert and oriented X4. Patient is irritable and demanding today.  Patient denies SI/HI/AVH.  Patient  states he has chronic back pain.  Patient is upset that he does not have an order for Oxycodone.  Patient talked with the doctor. Patient is compliant with medications.  Patient spent some time in the milieu.  Order for hot pack to affected facial area received. Patient threatened he would harm himself if he did not receive Oxycodone.  Patient loud and boisterous in the dayroom telling peers about how he really needs his pain medication and his threats of self harm. Patient found in his room with a string to his neck.  No bruising or injury noted.  MD made aware. Patient placed on 1:1.  Order for Oxycodone scheduled received. Patient continues on 1:1 checks.

## 2023-01-09 NOTE — Progress Notes (Signed)
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 The only reason I will not do it tonight is because of my little girl" Patient continue to say I was just in pain I needed my pain medication" Patient boisterous to Staff about how he could have killed himself. Support provided.

## 2023-01-09 NOTE — Group Note (Signed)
Date:  01/09/2023 Time:  12:13 AM  Group Topic/Focus:  Conflict Resolution:   The focus of this group is to discuss the conflict resolution process and how it may be used upon discharge.    Participation Level:  Active  Participation Quality:  Appropriate  Affect:  Appropriate  Cognitive:  Alert  Insight: None  Engagement in Group:  Lacking  Modes of Intervention:  Socialization  Additional Comments:    [name] 01/09/2023, 12:13 AM

## 2023-01-10 MED ORDER — MELATONIN 5 MG PO TABS
5.0000 mg | ORAL_TABLET | Freq: Every day | ORAL | Status: DC
  Administered 2023-01-10 – 2023-01-11 (×3): 5 mg via ORAL
  Filled 2023-01-10 (×3): qty 1

## 2023-01-10 NOTE — Progress Notes (Signed)
Patient has received one ice pack for the area on his face this shift.  He states it has opened due to he noted a small amount of drainage on his pillow.  Patient participated in groups and interacted well with others.  Patient remains on 1:1.

## 2023-01-10 NOTE — Group Note (Signed)
Date:  01/10/2023 Time:  3:56 PM  Group Topic/Focus:  Building Self Esteem:   The Focus of this group is helping patients become aware of the effects of self-esteem on their lives, the things they and others do that enhance or undermine their self-esteem, seeing the relationship between their level of self-esteem and the choices they make and learning ways to enhance self-esteem.    Participation Level:  Active  Participation Quality:  Appropriate  Affect:  Appropriate  Cognitive:  Appropriate  Insight: Appropriate  Engagement in Group:  Engaged  Modes of Intervention:  Education  Additional Comments:     Izan Miron L Kensey Luepke 01/10/2023, 3:56 PM

## 2023-01-10 NOTE — Group Note (Unsigned)
Date:  01/10/2023 Time:  3:37 PM  Group Topic/Focus:  Healthy Communication:   The focus of this group is to discuss communication, barriers to communication, as well as healthy ways to communicate with others.     Participation Level:  {[facility] PARTICIPATION ZOXWR:60454}  Participation Quality:  {[facility] PARTICIPATION QUALITY:22265}  Affect:  {[facility] AFFECT:22266}  Cognitive:  {B[facility] COGNITIVE:22267}  Insight: {BHH Insight2:20797}  Engagement in Group:  {BHH ENGAGEMENT IN UJWJX:91478}  Modes of Intervention:  {BHH MODES OF INTERVENTION:22269}  Additional Comments:  ***  Luane School 01/10/2023, 3:37 PM

## 2023-01-10 NOTE — Progress Notes (Signed)
1:1 Note   Patient requested for melatonin stated he usually use 5 mg for sleep Provider notified. Medication administered Patient stated it has little effect he was tossing and turning. Patient stated that his Pain medication is not enough. Prn Tylenol given. 1:1 Observation ongoing for safety Patient remains safe.

## 2023-01-10 NOTE — Progress Notes (Signed)
  Patient remains on 1;1.  He has been compliant in taking all morning medications. Patient was up to dining room for lunch..  Took his lunch withou incident.  Patient is calm and cooperative. He was seen by Dr Marval Regal.

## 2023-01-10 NOTE — Group Note (Signed)
Date:  01/10/2023 Time:  3:43 PM  Group Topic/Focus:  Healthy Communication:   The focus of this group is to discuss communication, barriers to communication, as well as healthy ways to communicate with others.    Participation Level:  Did Not Attend     Rodena Goldmann 01/10/2023, 3:43 PM

## 2023-01-10 NOTE — Progress Notes (Signed)
Patient remains on 1;1.  Patient was up to dining room at 0800.  Took his breakfast withou incident.  Patient remains irritable at times.

## 2023-01-10 NOTE — Progress Notes (Signed)
[facility] MD Progress Note  01/10/2023 1:28 PM Jeffery Gross  MRN:  [id number] Subjective: Chart reviewed, case discussed with staff, patient seen today during a.m. rounds.  Yesterday patient was threatening to harm himself and was found with a string in a garment wrapped around his neck.  Patient was put on one-to-one for safety precautions.  Today patient reports that he felt like harming himself due to pain.  He has been started on oxycodone.  Patient reports relief from the medicine.  Patient said that he was trying to strangle himself but then he thought about his daughter.  Patient reports that he cares and love his daughter.  Patient said later he talked to his daughter via phone.  Patient said" my daughter said she is glad that I did not harm myself".  Patient was provided with support and reassurance.  Patient was encouraged to attend groups and work on coping strategies.  He denies any intention to harm himself on the unit.  He denies any psychotic symptoms.  We discussed follow-up with pain clinic upon discharge.  Patient was encouraged to work with the social on a safe discharge plan.   Principal Problem: MDD (major depressive disorder), recurrent severe, without psychosis (HCC) Diagnosis: Principal Problem:   MDD (major depressive disorder), recurrent severe, without psychosis (HCC) Active Problems:   S/P AKA (above knee amputation) bilateral (HCC)   Chronic low back pain   Cocaine abuse (HCC)   Opiate dependence (HCC)   Homelessness   Facial cellulitis  Total Time spent with patient: 30 minutes  Past Psychiatric History: Past history of longstanding drug abuse  Past Medical History:  Past Medical History:  Diagnosis Date   Back pain    Chest pain    Depression    DVT (deep venous thrombosis) (HCC)    GERD (gastroesophageal reflux disease)    History of kidney stones    passed   HTN (hypertension)    no longer has HTN    Past Surgical History:  Procedure Laterality Date    AMPUTATION Bilateral 01/09/2016   Procedure: Bilateral Above Knee Amputation, Apply Wound VAC;  Surgeon: Nadara Mustard, MD;  Location: [facility] OR;  Service: Orthopedics;  Laterality: Bilateral;   AMPUTATION Right 08/09/2022   Procedure: RIGHT REVISION AMPUTATION ABOVE KNEE;  Surgeon: Nadara Mustard, MD;  Location: [facility] OR;  Service: Orthopedics;  Laterality: Right;   FASCIOTOMY Bilateral 01/03/2016   Procedure: FASCIOTOMY;  Surgeon: Tarry Kos, MD;  Location: [facility] OR;  Service: Orthopedics;  Laterality: Bilateral;   I & D EXTREMITY Bilateral 01/05/2016   Procedure: IRRIGATION AND DEBRIDEMENT BILATERAL LOWER EXTREMITIES; WOUND VAC CHANGE;  Surgeon: Tarry Kos, MD;  Location: [facility] OR;  Service: Orthopedics;  Laterality: Bilateral;   I & D EXTREMITY Left 01/07/2016   Procedure: Irrigation and Debridement of Left Lower Extremity with Application of Wound Vac;  Surgeon: Tarry Kos, MD;  Location: [facility] OR;  Service: Orthopedics;  Laterality: Left;   STUMP REVISION Right 07/04/2022   Procedure: REVISION RIGHT ABOVE KNEE AMPUTATION;  Surgeon: Nadara Mustard, MD;  Location: [facility] OR;  Service: Orthopedics;  Laterality: Right;   VASECTOMY     Family History:  Family History  Problem Relation Age of Onset   Hypertension Father    Family Psychiatric  History: See previous Social History:  Social History   Substance and Sexual Activity  Alcohol Use Yes   Comment: rare     Social History   Substance and Sexual  Activity  Drug Use Yes   Types: Cocaine    Social History   Socioeconomic History   Marital status: Single    Spouse name: Not on file   Number of children: 3   Years of education: Not on file   Highest education level: Not on file  Occupational History   Occupation: disabled  Tobacco Use   Smoking status: Every Day    Packs/day: 0.25    Years: 5.00    Additional pack years: 0.00    Total pack years: 1.25    Types: Cigarettes   Smokeless tobacco: Current  Vaping Use   Vaping Use: Every  day   Substances: Nicotine  Substance and Sexual Activity   Alcohol use: Yes    Comment: rare   Drug use: Yes    Types: Cocaine   Sexual activity: Not on file  Other Topics Concern   Not on file  Social History Narrative   Not on file   Social Determinants of Health   Financial Resource Strain: Not on file  Food Insecurity: Food Insecurity Present (01/08/2023)   Hunger Vital Sign    Worried About Running Out of Food in the Last Year: Sometimes true    Ran Out of Food in the Last Year: Sometimes true  Transportation Needs: No Transportation Needs (01/08/2023)   PRAPARE - Administrator, Civil Service (Medical): No    Lack of Transportation (Non-Medical): No  Physical Activity: Not on file  Stress: Not on file  Social Connections: Not on file   Additional Social History:                         Sleep: Fair  Appetite:  Fair  Current Medications: Current Facility-Administered Medications  Medication Dose Route Frequency Provider Last Rate Last Admin   acetaminophen (TYLENOL) tablet 650 mg  650 mg Oral Q6H PRN Rankin, Shuvon B, NP   650 mg at 01/10/23 0116   alum & mag hydroxide-simeth (MAALOX/MYLANTA) 200-200-20 MG/5ML suspension 30 mL  30 mL Oral Q4H PRN Rankin, Shuvon B, NP       diphenhydrAMINE (BENADRYL) capsule 50 mg  50 mg Oral TID PRN Rankin, Shuvon B, NP       Or   diphenhydrAMINE (BENADRYL) injection 50 mg  50 mg Intramuscular TID PRN Rankin, Shuvon B, NP       doxycycline (VIBRA-TABS) tablet 100 mg  100 mg Oral Q12H Clapacs, John T, MD   100 mg at 01/10/23 1010   DULoxetine (CYMBALTA) DR capsule 60 mg  60 mg Oral q AM Rankin, Shuvon B, NP   60 mg at 01/10/23 2952   gabapentin (NEURONTIN) capsule 300 mg  300 mg Oral TID Rankin, Shuvon B, NP   300 mg at 01/10/23 1011   haloperidol (HALDOL) tablet 5 mg  5 mg Oral TID PRN Rankin, Shuvon B, NP       Or   haloperidol lactate (HALDOL) injection 5 mg  5 mg Intramuscular TID PRN Rankin, Shuvon B, NP        magnesium hydroxide (MILK OF MAGNESIA) suspension 30 mL  30 mL Oral Daily PRN Rankin, Shuvon B, NP       melatonin tablet 5 mg  5 mg Oral QHS Dixon, Rashaun M, NP   5 mg at 01/10/23 0125   nicotine (NICODERM CQ - dosed in mg/24 hours) patch 14 mg  14 mg Transdermal Q24H Clapacs, Jackquline Denmark, MD  14 mg at 01/10/23 1123   oxyCODONE (Oxy IR/ROXICODONE) immediate release tablet 10 mg  10 mg Oral Q6H Clapacs, John T, MD   10 mg at 01/10/23 1125   Vitamin D (Ergocalciferol) (DRISDOL) 1.25 MG (50000 UNIT) capsule 50,000 Units  50,000 Units Oral Q Fri Rankin, Shuvon B, NP   50,000 Units at 01/09/23 7564    Lab Results:  Results for orders placed or performed during the hospital encounter of 01/08/23 (from the past 48 hour(s))  Basic metabolic panel     Status: Abnormal   Collection Time: 01/08/23  4:35 PM  Result Value Ref Range   Sodium 141 135 - 145 mmol/L   Potassium 4.4 3.5 - 5.1 mmol/L   Chloride 112 (H) 98 - 111 mmol/L   CO2 22 22 - 32 mmol/L   Glucose, Bld 120 (H) 70 - 99 mg/dL    Comment: Glucose reference range applies only to samples taken after fasting for at least 8 hours.   BUN 21 8 - 23 mg/dL   Creatinine, Ser 3.32 0.61 - 1.24 mg/dL   Calcium 8.8 (L) 8.9 - 10.3 mg/dL   GFR, Estimated >95 >18 mL/min    Comment: (NOTE) Calculated using the CKD-EPI Creatinine Equation (2021)    Anion gap 7 5 - 15    Comment: Performed at [facility], [address] [place], [place] [address]  CBC with Differential/Platelet     Status: None   Collection Time: 01/09/23  3:40 PM  Result Value Ref Range   WBC 7.8 4.0 - 10.5 K/uL   RBC 4.90 4.22 - 5.81 MIL/uL   Hemoglobin 13.1 13.0 - 17.0 g/dL   HCT 06.3 01.6 - 01.0 %   MCV 84.5 80.0 - 100.0 fL   MCH 26.7 26.0 - 34.0 pg   MCHC 31.6 30.0 - 36.0 g/dL   RDW 93.2 35.5 - 73.2 %   Platelets 196 150 - 400 K/uL   nRBC 0.0 0.0 - 0.2 %   Neutrophils Relative % 62 %   Neutro Abs 4.8 1.7 - 7.7 K/uL   Lymphocytes Relative 29 %   Lymphs Abs 2.3  0.7 - 4.0 K/uL   Monocytes Relative 4 %   Monocytes Absolute 0.3 0.1 - 1.0 K/uL   Eosinophils Relative 5 %   Eosinophils Absolute 0.4 0.0 - 0.5 K/uL   Basophils Relative 0 %   Basophils Absolute 0.0 0.0 - 0.1 K/uL   Immature Granulocytes 0 %   Abs Immature Granulocytes 0.02 0.00 - 0.07 K/uL    Comment: Performed at [facility], [address] [place], [place] [address]    Blood Alcohol level:  Lab Results  Component Value Date   [facility] <10 01/07/2023   ETH <10 12/04/2022    Metabolic Disorder Labs: Lab Results  Component Value Date   HGBA1C 5.8 (H) 06/14/2022   MPG 120 06/14/2022   No results found for: "PROLACTIN" No results found for: "CHOL", "TRIG", "HDL", "CHOLHDL", "VLDL", "LDLCALC"    Psychiatric Specialty Exam:  Presentation  General Appearance:  Appropriate for Environment  Eye Contact: Fair  Speech: Clear and Coherent  Speech Volume: Normal  Handedness:No data recorded  Mood and Affect  Mood: Depressed  Affect: Congruent   Thought Process  Thought Processes: Coherent; Goal Directed  Descriptions of Associations:Intact  Orientation:Full (Time, Place and Person)  Thought Content:SI, denies HI or AVH  History of Schizophrenia/Schizoaffective disorder:No  Ideas of Reference:None   Sensorium  Memory: Immediate Fair; Recent  Fair; Remote Fair  Judgment: Poor  Insight: Fair   Chartered certified accountant: Fair  Attention Span: Fair  Recall: [employer] of Knowledge: Fair  Language: Fair   Assets  Assets: Manufacturing systems engineer; Desire for Improvement Family support  Physical Exam: Physical Exam Constitutional:      Appearance: Normal appearance.  HENT:     Head: Normocephalic and atraumatic.     Mouth/Throat:     Pharynx: Oropharynx is clear.  Eyes:     Pupils: Pupils are equal, round, and reactive to light.  Cardiovascular:     Rate and Rhythm: Normal rate.  Pulmonary:     Effort:  Pulmonary effort is normal.  Musculoskeletal:     Comments: Bilateral above-the-knee amputations  Skin:    General: Skin is warm and dry.  Neurological:     General: No focal deficit present.     Mental Status: He is alert.  Psychiatric:        Attention and Perception: Attention normal.        Speech: Speech normal.        Cognition and Memory: Cognition normal.    Review of Systems  Constitutional: Negative.   HENT: Negative.    Eyes: Negative.   Respiratory: Negative.    Cardiovascular: Negative.   Gastrointestinal: Negative.   Musculoskeletal:  Positive for back pain.  Skin: Negative.   Neurological: Negative.   Psychiatric/Behavioral:  Positive for depression and suicidal ideas.    Blood pressure 122/69, pulse (!) 55, temperature (!) 97.2 F (36.2 C), resp. rate 17, weight 64.9 kg, SpO2 99 %. Body mass index is 18.36 kg/m.   Treatment Plan Summary: Medication management and Plan      ; will continue to monitor for improvement in symptoms and safety.  Will continue on one-to-one for now which can be reassessed later .  Continue on current treatment plan and medicine.  We discussed coping strategies and follow-up plan upon discharge.  Lewanda Rife, MD 01/10/2023, 1:28 PM

## 2023-01-10 NOTE — Progress Notes (Signed)
1:1 NOTE  Patient compliant with medication endorsing Passive SI with no plan at present and verbally contracts for safety 1: 1 Sitter by bedside. Patient talking about his previous admission at [facility] stating "They kicked me back to the streets after I said I wanted to kill myself I thought they were suppose to let me stay for long" Support and encouragement provided.

## 2023-01-10 NOTE — Group Note (Signed)
Date:  01/10/2023 Time:  11:20 PM  Group Topic/Focus:  Building Self Esteem:   The Focus of this group is helping patients become aware of the effects of self-esteem on their lives, the things they and others do that enhance or undermine their self-esteem, seeing the relationship between their level of self-esteem and the choices they make and learning ways to enhance self-esteem.    Participation Level:  Active  Participation Quality:  Appropriate  Affect:  Appropriate  Cognitive:  Appropriate  Insight: Good  Engagement in Group:  Engaged  Modes of Intervention:  Education  Additional Comments:    [name] 01/10/2023, 11:20 PM

## 2023-01-10 NOTE — Progress Notes (Signed)
   01/10/23 2200  Psych Admission Type (Psych Patients Only)  Admission Status Involuntary  Psychosocial Assessment  Patient Complaints Anxiety  Eye Contact Fair  Facial Expression Animated  Affect Appropriate to circumstance  Speech Unremarkable  Interaction Assertive  Motor Activity Slow  Appearance/Hygiene Poor hygiene  Behavior Characteristics Cooperative  Mood Preoccupied;Anxious  Thought Process  Coherency WDL  Content WDL  Delusions None reported or observed  Perception WDL  Hallucination None reported or observed  Judgment WDL  Confusion None  Danger to Self  Current suicidal ideation? Denies  Agreement Not to Harm Self Yes  Description of Agreement verbal  Danger to Others  Danger to Others None reported or observed

## 2023-01-11 NOTE — Progress Notes (Signed)
Patient is alert and oriented X4.  Patient denies SI/HI/AVH.  Patient was seen by the MD.  1:1 was discontinued.  Belongings were returned to him.  Patient spent time in the milieu.  Patient is irritable at times, complains about things his peers do. Other times he jokes and laughs with his peers and staff.  Remains on Q 15 minute checks.

## 2023-01-11 NOTE — Group Note (Signed)
[facility], [place] LCSW Group Therapy Note   Group Date: 01/11/2023 Start Time: 1305 End Time: 1404   Type of Therapy/Topic:  Group Therapy:  Balance in Life  Participation Level:  Active   Description of Group:    This group will address the concept of balance and how it feels and looks when one is unbalanced. Patients will be encouraged to process areas in their lives that are out of balance, and identify reasons for remaining unbalanced. Facilitators will guide patients utilizing problem- solving interventions to address and correct the stressor making their life unbalanced. Understanding and applying boundaries will be explored and addressed for obtaining  and maintaining a balanced life. Patients will be encouraged to explore ways to assertively make their unbalanced needs known to significant others in their lives, using other group members and facilitator for support and feedback.  Therapeutic Goals: Patient will identify two or more emotions or situations they have that consume much of in their lives. Patient will identify signs/triggers that life has become out of balance:  Patient will identify two ways to set boundaries in order to achieve balance in their lives:  Patient will demonstrate ability to communicate their needs through discussion and/or role plays  Summary of Patient Progress:    The patient stated that during his free time his favorite thing to do is go fishing. The patient did participate at first because he was coloring and stated that he uses it to relieve his mind. The patient started to participate during the end, stating that he reduced the clutter in his life by walking away from everything and it made him happy.     Therapeutic Modalities:   Cognitive Behavioral Therapy Solution-Focused Therapy Assertiveness Training   Marshell Levan, LCSW

## 2023-01-11 NOTE — Group Note (Signed)
Date:  01/11/2023 Time:  8:56 PM  Group Topic/Focus:  Conflict Resolution:   The focus of this group is to discuss the conflict resolution process and how it may be used upon discharge.    Participation Level:  Active  Participation Quality:  Appropriate  Affect:  Appropriate  Cognitive:  Appropriate  Insight: Good  Engagement in Group:  Engaged  Modes of Intervention:  Education  Additional Comments:    [name] 01/11/2023, 8:56 PM

## 2023-01-11 NOTE — Progress Notes (Signed)
[facility] MD Progress Note  01/11/2023 11:29 AM Jeffery Gross  MRN:  [id number] Subjective: Chart reviewed, case discussed with staff, patient seen today during a.m. rounds.  Patient reports that he is doing better today.  Reports oxycodone has helped him with pain but still he gets mild to moderate pain in between 2 doses of oxycodone.  Patient was encouraged to use ibuprofen or Tylenol for breakthrough pain.  Patient was provided with support and reassurance.  Patient was encouraged to attend groups and work on coping strategies.  He denies any intention to harm himself on the unit.  He agrees to talk to staff if he feels overwhelmed with pain or depression and has any thoughts or intention to harm himself.  He denies any psychotic symptoms.  We discussed follow-up with pain clinic upon discharge.  Patient was encouraged to work with the social on a safe discharge plan.   Principal Problem: MDD (major depressive disorder), recurrent severe, without psychosis (HCC) Diagnosis: Principal Problem:   MDD (major depressive disorder), recurrent severe, without psychosis (HCC) Active Problems:   S/P AKA (above knee amputation) bilateral (HCC)   Chronic low back pain   Cocaine abuse (HCC)   Opiate dependence (HCC)   Homelessness   Facial cellulitis  Total Time spent with patient: 30 minutes  Past Psychiatric History: Past history of longstanding drug abuse  Past Medical History:  Past Medical History:  Diagnosis Date   Back pain    Chest pain    Depression    DVT (deep venous thrombosis) (HCC)    GERD (gastroesophageal reflux disease)    History of kidney stones    passed   HTN (hypertension)    no longer has HTN    Past Surgical History:  Procedure Laterality Date   AMPUTATION Bilateral 01/09/2016   Procedure: Bilateral Above Knee Amputation, Apply Wound VAC;  Surgeon: Nadara Mustard, MD;  Location: [facility] OR;  Service: Orthopedics;  Laterality: Bilateral;   AMPUTATION Right 08/09/2022    Procedure: RIGHT REVISION AMPUTATION ABOVE KNEE;  Surgeon: Nadara Mustard, MD;  Location: [facility] OR;  Service: Orthopedics;  Laterality: Right;   FASCIOTOMY Bilateral 01/03/2016   Procedure: FASCIOTOMY;  Surgeon: Tarry Kos, MD;  Location: [facility] OR;  Service: Orthopedics;  Laterality: Bilateral;   I & D EXTREMITY Bilateral 01/05/2016   Procedure: IRRIGATION AND DEBRIDEMENT BILATERAL LOWER EXTREMITIES; WOUND VAC CHANGE;  Surgeon: Tarry Kos, MD;  Location: [facility] OR;  Service: Orthopedics;  Laterality: Bilateral;   I & D EXTREMITY Left 01/07/2016   Procedure: Irrigation and Debridement of Left Lower Extremity with Application of Wound Vac;  Surgeon: Tarry Kos, MD;  Location: [facility] OR;  Service: Orthopedics;  Laterality: Left;   STUMP REVISION Right 07/04/2022   Procedure: REVISION RIGHT ABOVE KNEE AMPUTATION;  Surgeon: Nadara Mustard, MD;  Location: [facility] OR;  Service: Orthopedics;  Laterality: Right;   VASECTOMY     Family History:  Family History  Problem Relation Age of Onset   Hypertension Father    Family Psychiatric  History: See previous Social History:  Social History   Substance and Sexual Activity  Alcohol Use Yes   Comment: rare     Social History   Substance and Sexual Activity  Drug Use Yes   Types: Cocaine    Social History   Socioeconomic History   Marital status: Single    Spouse name: Not on file   Number of children: 3   Years of education: Not  on file   Highest education level: Not on file  Occupational History   Occupation: disabled  Tobacco Use   Smoking status: Every Day    Packs/day: 0.25    Years: 5.00    Additional pack years: 0.00    Total pack years: 1.25    Types: Cigarettes   Smokeless tobacco: Current  Vaping Use   Vaping Use: Every day   Substances: Nicotine  Substance and Sexual Activity   Alcohol use: Yes    Comment: rare   Drug use: Yes    Types: Cocaine   Sexual activity: Not on file  Other Topics Concern   Not on file  Social History  Narrative   Not on file   Social Determinants of Health   Financial Resource Strain: Not on file  Food Insecurity: Food Insecurity Present (01/08/2023)   Hunger Vital Sign    Worried About Running Out of Food in the Last Year: Sometimes true    Ran Out of Food in the Last Year: Sometimes true  Transportation Needs: No Transportation Needs (01/08/2023)   PRAPARE - Administrator, Civil Service (Medical): No    Lack of Transportation (Non-Medical): No  Physical Activity: Not on file  Stress: Not on file  Social Connections: Not on file   Additional Social History:                         Sleep: Fair  Appetite:  Fair  Current Medications: Current Facility-Administered Medications  Medication Dose Route Frequency Provider Last Rate Last Admin   acetaminophen (TYLENOL) tablet 650 mg  650 mg Oral Q6H PRN Rankin, Shuvon B, NP   650 mg at 01/10/23 0116   alum & mag hydroxide-simeth (MAALOX/MYLANTA) 200-200-20 MG/5ML suspension 30 mL  30 mL Oral Q4H PRN Rankin, Shuvon B, NP       diphenhydrAMINE (BENADRYL) capsule 50 mg  50 mg Oral TID PRN Rankin, Shuvon B, NP       Or   diphenhydrAMINE (BENADRYL) injection 50 mg  50 mg Intramuscular TID PRN Rankin, Shuvon B, NP       doxycycline (VIBRA-TABS) tablet 100 mg  100 mg Oral Q12H Clapacs, John T, MD   100 mg at 01/11/23 1022   DULoxetine (CYMBALTA) DR capsule 60 mg  60 mg Oral q AM Rankin, Shuvon B, NP   60 mg at 01/11/23 0759   gabapentin (NEURONTIN) capsule 300 mg  300 mg Oral TID Rankin, Shuvon B, NP   300 mg at 01/11/23 1021   haloperidol (HALDOL) tablet 5 mg  5 mg Oral TID PRN Rankin, Shuvon B, NP       Or   haloperidol lactate (HALDOL) injection 5 mg  5 mg Intramuscular TID PRN Rankin, Shuvon B, NP       magnesium hydroxide (MILK OF MAGNESIA) suspension 30 mL  30 mL Oral Daily PRN Rankin, Shuvon B, NP       melatonin tablet 5 mg  5 mg Oral QHS Dixon, Rashaun M, NP   5 mg at 01/10/23 2211   nicotine (NICODERM CQ -  dosed in mg/24 hours) patch 14 mg  14 mg Transdermal Q24H Clapacs, John T, MD   14 mg at 01/11/23 1022   oxyCODONE (Oxy IR/ROXICODONE) immediate release tablet 10 mg  10 mg Oral Q6H Clapacs, John T, MD   10 mg at 01/11/23 1037   Vitamin D (Ergocalciferol) (DRISDOL) 1.25 MG (50000 UNIT) capsule 50,000  Units  50,000 Units Oral Q Fri Rankin, Shuvon B, NP   50,000 Units at 01/09/23 9629    Lab Results:  Results for orders placed or performed during the hospital encounter of 01/08/23 (from the past 48 hour(s))  CBC with Differential/Platelet     Status: None   Collection Time: 01/09/23  3:40 PM  Result Value Ref Range   WBC 7.8 4.0 - 10.5 K/uL   RBC 4.90 4.22 - 5.81 MIL/uL   Hemoglobin 13.1 13.0 - 17.0 g/dL   HCT 52.8 41.3 - 24.4 %   MCV 84.5 80.0 - 100.0 fL   MCH 26.7 26.0 - 34.0 pg   MCHC 31.6 30.0 - 36.0 g/dL   RDW 01.0 27.2 - 53.6 %   Platelets 196 150 - 400 K/uL   nRBC 0.0 0.0 - 0.2 %   Neutrophils Relative % 62 %   Neutro Abs 4.8 1.7 - 7.7 K/uL   Lymphocytes Relative 29 %   Lymphs Abs 2.3 0.7 - 4.0 K/uL   Monocytes Relative 4 %   Monocytes Absolute 0.3 0.1 - 1.0 K/uL   Eosinophils Relative 5 %   Eosinophils Absolute 0.4 0.0 - 0.5 K/uL   Basophils Relative 0 %   Basophils Absolute 0.0 0.0 - 0.1 K/uL   Immature Granulocytes 0 %   Abs Immature Granulocytes 0.02 0.00 - 0.07 K/uL    Comment: Performed at [facility], [address] [place], [place] [address]    Blood Alcohol level:  Lab Results  Component Value Date   [facility] <10 01/07/2023   ETH <10 12/04/2022    Metabolic Disorder Labs: Lab Results  Component Value Date   HGBA1C 5.8 (H) 06/14/2022   MPG 120 06/14/2022   No results found for: "PROLACTIN" No results found for: "CHOL", "TRIG", "HDL", "CHOLHDL", "VLDL", "LDLCALC"    Psychiatric Specialty Exam:  Presentation  General Appearance:  Appropriate for Environment  Eye Contact: Fair  Speech: Clear and Coherent  Speech  Volume: Normal  Handedness:No data recorded  Mood and Affect  Mood: " better"  Affect: Congruent   Thought Process  Thought Processes: Coherent; Goal Directed  Descriptions of Associations:Intact  Orientation:Full (Time, Place and Person)  Thought Content:SI, denies HI or AVH  History of Schizophrenia/Schizoaffective disorder:No  Ideas of Reference:None   Sensorium  Memory: Immediate Fair; Recent Fair; Remote Fair  Judgment: Improving  Insight: Fair   Chartered certified accountant: Fair  Attention Span: Fair  Recall: [employer] of Knowledge: Fair  Language: Fair   Assets  Assets: Communication Skills; Desire for Improvement Family support  Physical Exam: Physical Exam Constitutional:      Appearance: Normal appearance.  HENT:     Head: Normocephalic and atraumatic.     Mouth/Throat:     Pharynx: Oropharynx is clear.  Eyes:     Pupils: Pupils are equal, round, and reactive to light.  Cardiovascular:     Rate and Rhythm: Normal rate.  Pulmonary:     Effort: Pulmonary effort is normal.  Musculoskeletal:     Comments: Bilateral above-the-knee amputations  Skin:    General: Skin is warm and dry.  Neurological:     General: No focal deficit present.     Mental Status: He is alert.  Psychiatric:        Attention and Perception: Attention normal.        Speech: Speech normal.        Cognition and Memory: Cognition normal.  Review of Systems  Constitutional: Negative.   HENT: Negative.    Eyes: Negative.   Respiratory: Negative.    Cardiovascular: Negative.   Gastrointestinal: Negative.   Musculoskeletal:  Positive for back pain.  Skin: Negative.   Neurological:  Positive for tingling.   Blood pressure 125/70, pulse 67, temperature (!) 97.2 F (36.2 C), resp. rate 18, weight 64.9 kg, SpO2 95 %. Body mass index is 18.36 kg/m.   Treatment Plan Summary: Medication management and Plan   will continue to monitor for  improvement in symptoms and safety.  Will discontinue one-to-one precautions and continue on every 15 minute safety Checks .  Continue on current treatment plan and medicine.  We discussed coping strategies and follow-up plan upon discharge.  Lewanda Rife, MD 01/11/2023, 11:29 AM

## 2023-01-11 NOTE — Care Management Note (Signed)
As we're preparing for morning group meeting, I ask Jeffery Gross if he plans to attend group.  He says, "Probably not."  I explain that attending group is essential for his care, and group attendance is a partial measurement to determine when someone is ready to leave.  Jeffery Gross stated, "I'm not trying to leave.  I have nowhere to go and will stay here as long as possible."

## 2023-01-11 NOTE — Plan of Care (Signed)
  Problem: Education: Goal: Knowledge of General Education information will improve Description: Including pain rating scale, medication(s)/side effects and non-pharmacologic comfort measures Outcome: Progressing   Problem: Clinical Measurements: Goal: Ability to maintain clinical measurements within normal limits will improve Outcome: Progressing   Problem: Coping: Goal: Level of anxiety will decrease Outcome: Progressing   Problem: Pain Managment: Goal: General experience of comfort will improve Outcome: Progressing   Problem: Safety: Goal: Ability to remain free from injury will improve Outcome: Progressing   Problem: Skin Integrity: Goal: Risk for impaired skin integrity will decrease Outcome: Progressing   

## 2023-01-12 MED ORDER — MELATONIN 5 MG PO TABS
8.0000 mg | ORAL_TABLET | Freq: Every day | ORAL | Status: DC
  Administered 2023-01-12 – 2023-01-19 (×8): 7.5 mg via ORAL
  Filled 2023-01-12 (×8): qty 2

## 2023-01-12 NOTE — Group Note (Signed)
Recreation Therapy Group Note   Group Topic:Emotion Expression  Group Date: 01/12/2023 Start Time: 1400 End Time: 1450 Facilitators: Rosina Lowenstein, LRT, CTRS Location:  Day Room  Group Description: Positivity Collage. LRT and patients discussed the importance of having a positive mindset and being happy. Patients received magazines, safety scissors, a glue stick and a piece of paper. Pts were encouraged to find images or words in the magazines that showed "happiness" or positivity to them. Pt shared their collage with the group once they were finished. LRT and pts discussed how it can be difficult to always have a positive mindset, especially when they have mental health challenges.   Goal Area(s) Addressed:  Pt will identify things associate with positivity. Pt will reduce negative thinking. Pt will identify a new coping skill of thinking positive thoughts.   Affect/Mood: Appropriate, Full range, and Happy   Participation Level: Active and Engaged   Participation Quality: Independent   Behavior: Cooperative and Medical illustrator Process: Coherent   Insight: Good   Judgement: Good   Modes of Intervention: Art and Activity   Patient Response to Interventions:  Attentive, Engaged, Interested , and Receptive   Education Outcome:  Acknowledges education   Clinical Observations/Individualized Feedback: Jeffery Gross was active in their participation of session activities and group discussion. Pt identified "a child with his father, children in general, and hugs" as positive things that make him happy. Pt interacted well with LRT and peers duration of session.    Plan: Continue to engage patient in RT group sessions 2-3x/week.   Rosina Lowenstein, LRT, CTRS 01/12/2023 3:28 PM

## 2023-01-12 NOTE — Group Note (Signed)
Date:  01/12/2023 Time:  10:49 PM  Group Topic/Focus:  Building Self Esteem:   The Focus of this group is helping patients become aware of the effects of self-esteem on their lives, the things they and others do that enhance or undermine their self-esteem, seeing the relationship between their level of self-esteem and the choices they make and learning ways to enhance self-esteem. Coping With Mental Health Crisis:   The purpose of this group is to help patients identify strategies for coping with mental health crisis.  Group discusses possible causes of crisis and ways to manage them effectively. Crisis Planning:   The purpose of this group is to help patients create a crisis plan for use upon discharge or in the future, as needed. Developing a Wellness Toolbox:   The focus of this group is to help patients develop a "wellness toolbox" with skills and strategies to promote recovery upon discharge. Goals Group:   The focus of this group is to help patients establish daily goals to achieve during treatment and discuss how the patient can incorporate goal setting into their daily lives to aide in recovery. Healthy Communication:   The focus of this group is to discuss communication, barriers to communication, as well as healthy ways to communicate with others. Identifying Needs:   The focus of this group is to help patients identify their personal needs that have been historically problematic and identify healthy behaviors to address their needs. Making Healthy Choices:   The focus of this group is to help patients identify negative/unhealthy choices they were using prior to admission and identify positive/healthier coping strategies to replace them upon discharge. Overcoming Stress:   The focus of this group is to define stress and help patients assess their triggers. Personal Choices and Values:   The focus of this group is to help patients assess and explore the importance of values in their lives, how  their values affect their decisions, how they express their values and what opposes their expression. Recovery Goals:   The focus of this group is to identify appropriate goals for recovery and establish a plan to achieve them. Self Care:   The focus of this group is to help patients understand the importance of self-care in order to improve or restore emotional, physical, spiritual, interpersonal, and financial health.    Participation Level:  Minimal  Participation Quality:  Appropriate  Affect:  Appropriate  Cognitive:  Alert and Appropriate  Insight: Good  Engagement in Group:  Developing/Improving, Engaged, and Supportive  Modes of Intervention:  Discussion and Support  Additional Comments:    Maeola Harman 01/12/2023, 10:49 PM

## 2023-01-12 NOTE — Progress Notes (Signed)
Patient has been cooperative with treatment on shift, he has been compliant with medications. Patient denies SI, HI & AVH. No new issues to report on shift at this time.

## 2023-01-12 NOTE — Progress Notes (Signed)
Recreation Therapy Notes  LRT gave pt a set of thin markers as requested by Maryelizabeth Kaufmann, Facilities manager.    Salina April, LRT/CTRS [facility] Abner Greenspan 01/12/2023 3:38 PM

## 2023-01-12 NOTE — Progress Notes (Signed)
Patient denies SI, HI, and AVH. He endorses depression. He rates his back pain as a 7/10. Scheduled oxycodone given. Patient is compliant with scheduled medications. Support and encouragement provided. Patient remains safe on the unit at this time.

## 2023-01-12 NOTE — Progress Notes (Signed)
[facility] MD Progress Note  01/12/2023 11:15 AM Jeffery Gross  MRN:  [id number] Subjective: Chart reviewed, case discussed with staff, patient seen today during a.m. rounds.  Patient continues to report improvement in his symptoms.  Reports that he is doing better today.  Patient still reports on and off pain although oxycodone has helped him with pain Patient once again was encouraged to use  Tylenol for breakthrough pain.  Patient was provided with support and reassurance.  Patient was encouraged to attend groups and work on coping strategies.  He denies any intention to harm himself on the unit.  He agrees to talk to staff if he feels overwhelmed with pain or depression and has any thoughts or intention to harm himself.  He denies any psychotic symptoms.  We discussed follow-up with pain clinic upon discharge.  Patient was encouraged to work with the social on a safe discharge plan.   Principal Problem: MDD (major depressive disorder), recurrent severe, without psychosis (HCC) Diagnosis: Principal Problem:   MDD (major depressive disorder), recurrent severe, without psychosis (HCC) Active Problems:   S/P AKA (above knee amputation) bilateral (HCC)   Chronic low back pain   Cocaine abuse (HCC)   Opiate dependence (HCC)   Homelessness   Facial cellulitis  Total Time spent with patient: 30 minutes  Past Psychiatric History: Past history of longstanding drug abuse  Past Medical History:  Past Medical History:  Diagnosis Date  . Back pain   . Chest pain   . Depression   . DVT (deep venous thrombosis) (HCC)   . GERD (gastroesophageal reflux disease)   . History of kidney stones    passed  . HTN (hypertension)    no longer has HTN    Past Surgical History:  Procedure Laterality Date  . AMPUTATION Bilateral 01/09/2016   Procedure: Bilateral Above Knee Amputation, Apply Wound VAC;  Surgeon: Nadara Mustard, MD;  Location: [facility] OR;  Service: Orthopedics;  Laterality: Bilateral;  . AMPUTATION  Right 08/09/2022   Procedure: RIGHT REVISION AMPUTATION ABOVE KNEE;  Surgeon: Nadara Mustard, MD;  Location: [facility] OR;  Service: Orthopedics;  Laterality: Right;  . FASCIOTOMY Bilateral 01/03/2016   Procedure: FASCIOTOMY;  Surgeon: Tarry Kos, MD;  Location: [facility] OR;  Service: Orthopedics;  Laterality: Bilateral;  . I & D EXTREMITY Bilateral 01/05/2016   Procedure: IRRIGATION AND DEBRIDEMENT BILATERAL LOWER EXTREMITIES; WOUND VAC CHANGE;  Surgeon: Tarry Kos, MD;  Location: [facility] OR;  Service: Orthopedics;  Laterality: Bilateral;  . I & D EXTREMITY Left 01/07/2016   Procedure: Irrigation and Debridement of Left Lower Extremity with Application of Wound Vac;  Surgeon: Tarry Kos, MD;  Location: [facility] OR;  Service: Orthopedics;  Laterality: Left;  . STUMP REVISION Right 07/04/2022   Procedure: REVISION RIGHT ABOVE KNEE AMPUTATION;  Surgeon: Nadara Mustard, MD;  Location: [facility] OR;  Service: Orthopedics;  Laterality: Right;  Marland Kitchen VASECTOMY     Family History:  Family History  Problem Relation Age of Onset  . Hypertension Father    Family Psychiatric  History: See previous Social History:  Social History   Substance and Sexual Activity  Alcohol Use Yes   Comment: rare     Social History   Substance and Sexual Activity  Drug Use Yes  . Types: Cocaine    Social History   Socioeconomic History  . Marital status: Single    Spouse name: Not on file  . Number of children: 3  . Years of education:  Not on file  . Highest education level: Not on file  Occupational History  . Occupation: disabled  Tobacco Use  . Smoking status: Every Day    Packs/day: 0.25    Years: 5.00    Additional pack years: 0.00    Total pack years: 1.25    Types: Cigarettes  . Smokeless tobacco: Current  Vaping Use  . Vaping Use: Every day  . Substances: Nicotine  Substance and Sexual Activity  . Alcohol use: Yes    Comment: rare  . Drug use: Yes    Types: Cocaine  . Sexual activity: Not on file  Other Topics  Concern  . Not on file  Social History Narrative  . Not on file   Social Determinants of Health   Financial Resource Strain: Not on file  Food Insecurity: Food Insecurity Present (01/08/2023)   Hunger Vital Sign   . Worried About Programme researcher, broadcasting/film/video in the Last Year: Sometimes true   . Ran Out of Food in the Last Year: Sometimes true  Transportation Needs: No Transportation Needs (01/08/2023)   PRAPARE - Transportation   . Lack of Transportation (Medical): No   . Lack of Transportation (Non-Medical): No  Physical Activity: Not on file  Stress: Not on file  Social Connections: Not on file   Additional Social History:                         Sleep: Fair  Appetite:  Fair  Current Medications: Current Facility-Administered Medications  Medication Dose Route Frequency Provider Last Rate Last Admin  . acetaminophen (TYLENOL) tablet 650 mg  650 mg Oral Q6H PRN Rankin, Shuvon B, NP   650 mg at 01/10/23 0116  . alum & mag hydroxide-simeth (MAALOX/MYLANTA) 200-200-20 MG/5ML suspension 30 mL  30 mL Oral Q4H PRN Rankin, Shuvon B, NP      . diphenhydrAMINE (BENADRYL) capsule 50 mg  50 mg Oral TID PRN Rankin, Shuvon B, NP       Or  . diphenhydrAMINE (BENADRYL) injection 50 mg  50 mg Intramuscular TID PRN Rankin, Shuvon B, NP      . doxycycline (VIBRA-TABS) tablet 100 mg  100 mg Oral Q12H Clapacs, John T, MD   100 mg at 01/12/23 1003  . DULoxetine (CYMBALTA) DR capsule 60 mg  60 mg Oral q AM Rankin, Shuvon B, NP   60 mg at 01/12/23 0643  . gabapentin (NEURONTIN) capsule 300 mg  300 mg Oral TID Rankin, Shuvon B, NP   300 mg at 01/12/23 1003  . haloperidol (HALDOL) tablet 5 mg  5 mg Oral TID PRN Rankin, Shuvon B, NP       Or  . haloperidol lactate (HALDOL) injection 5 mg  5 mg Intramuscular TID PRN Rankin, Shuvon B, NP      . magnesium hydroxide (MILK OF MAGNESIA) suspension 30 mL  30 mL Oral Daily PRN Rankin, Shuvon B, NP      . melatonin tablet 7.5 mg  7.5 mg Oral QHS Kabir Brannock,  Jiles Goya, MD      . nicotine (NICODERM CQ - dosed in mg/24 hours) patch 14 mg  14 mg Transdermal Q24H Clapacs, John T, MD   14 mg at 01/12/23 1007  . oxyCODONE (Oxy IR/ROXICODONE) immediate release tablet 10 mg  10 mg Oral Q6H Clapacs, John T, MD   10 mg at 01/12/23 1003  . Vitamin D (Ergocalciferol) (DRISDOL) 1.25 MG (50000 UNIT) capsule 50,000 Units  50,000  Units Oral Q Fri Rankin, Shuvon B, NP   50,000 Units at 01/09/23 4098    Lab Results:  No results found for this or any previous visit (from the past 48 hour(s)).   Blood Alcohol level:  Lab Results  Component Value Date   ETH <10 01/07/2023   ETH <10 12/04/2022    Metabolic Disorder Labs: Lab Results  Component Value Date   HGBA1C 5.8 (H) 06/14/2022   MPG 120 06/14/2022   No results found for: "PROLACTIN" No results found for: "CHOL", "TRIG", "HDL", "CHOLHDL", "VLDL", "LDLCALC"    Psychiatric Specialty Exam:  Presentation  General Appearance:  Appropriate for Environment  Eye Contact: Fair  Speech: Clear and Coherent  Speech Volume: Normal  Mood and Affect  Mood: " better"  Affect: Congruent   Thought Process  Thought Processes: Coherent; Goal Directed  Descriptions of Associations:Intact  Orientation:Full (Time, Place and Person)  Thought Content:SI, denies HI or AVH  History of Schizophrenia/Schizoaffective disorder:No  Ideas of Reference:None   Sensorium  Memory: Immediate Fair; Recent Fair; Remote Fair  Judgment: Improving  Insight: Fair   Chartered certified accountant: Fair  Attention Span: Fair  Recall: [employer] of Knowledge: Fair  Language: Fair   Assets  Assets: Communication Skills; Desire for Improvement Family support  Physical Exam: Physical Exam Constitutional:      Appearance: Normal appearance.  HENT:     Head: Normocephalic and atraumatic.     Mouth/Throat:     Pharynx: Oropharynx is clear.  Eyes:     Pupils: Pupils are equal,  round, and reactive to light.  Cardiovascular:     Rate and Rhythm: Normal rate.  Pulmonary:     Effort: Pulmonary effort is normal.  Musculoskeletal:     Comments: Bilateral above-the-knee amputations  Skin:    General: Skin is warm and dry.  Neurological:     General: No focal deficit present.     Mental Status: He is alert.  Psychiatric:        Attention and Perception: Attention normal.        Speech: Speech normal.        Cognition and Memory: Cognition normal.   Review of Systems  Constitutional: Negative.   HENT: Negative.    Eyes: Negative.   Respiratory: Negative.    Cardiovascular: Negative.   Gastrointestinal: Negative.   Musculoskeletal:  Positive for back pain.  Skin: Negative.   Neurological:  Positive for tingling.   Blood pressure 99/70, pulse 77, temperature (!) 97 F (36.1 C), resp. rate 18, weight 70.8 kg, SpO2 97 %. Body mass index is 20.03 kg/m.   Treatment Plan Summary: Medication management and Plan   will continue to monitor for improvement in symptoms and safety.  continue on every 15 minute safety Checks .  Continue on current treatment plan and medicine.  We discussed coping strategies and follow-up plan upon discharge.  Lewanda Rife, MD

## 2023-01-13 MED ORDER — TRAZODONE HCL 50 MG PO TABS
50.0000 mg | ORAL_TABLET | Freq: Every evening | ORAL | Status: DC | PRN
  Administered 2023-01-13 – 2023-01-14 (×2): 50 mg via ORAL
  Filled 2023-01-13 (×2): qty 1

## 2023-01-13 MED ORDER — TRAZODONE HCL 50 MG PO TABS: 50.0000 mg | ORAL_TABLET | Freq: Every day | ORAL | Status: DC

## 2023-01-13 NOTE — Progress Notes (Signed)
Patient denies SI, HI, and AVH. He endorses 4/10 chronic back pain. Scheduled medications were given. Patient is compliant with scheduled medications. He has no other physical complaints. He has been eating meals in the dayroom but did not attend both groups this morning. He remains safe on the unit at this time.

## 2023-01-13 NOTE — Group Note (Signed)
Date:  01/13/2023 Time:  2:53 PM  Group Topic/Focus:  Wellness Toolbox:   The focus of this group is to discuss various aspects of wellness, balancing those aspects and exploring ways to increase the ability to experience wellness.  Patients will create a wellness toolbox for use upon discharge.  Music Therapy   Participation Level:  Did Not Attend  PHeather L Rayburn Mundis 01/13/2023, 2:53 PM

## 2023-01-13 NOTE — Group Note (Signed)
Date:  01/13/2023 Time:  2:57 PM  Group Topic/Focus:  Coping With Mental Health Crisis:   The purpose of this group is to help patients identify strategies for coping with mental health crisis.  Group discusses possible causes of crisis and ways to manage them effectively.    Participation Level:  Active  Participation Quality:  Appropriate  Affect:  Appropriate  Cognitive:  Alert and Appropriate  Insight: Appropriate  Engagement in Group:  Engaged  Modes of Intervention:  Activity  Additional Comments:    Doug Sou 01/13/2023, 2:57 PM

## 2023-01-13 NOTE — Group Note (Signed)
Date:  01/13/2023 Time:  3:07 PM  Group Topic/Focus:  Self Care:   The focus of this group is to help patients understand the importance of self-care in order to improve or restore emotional, physical, spiritual, interpersonal, and financial health.  Exercise and relaxation group..    Participation Level:  Did Not Attend  Doug Sou 01/13/2023, 3:07 PM

## 2023-01-13 NOTE — Group Note (Signed)
Recreation Therapy Group Note   Group Topic:Communication  Group Date: 01/13/2023 Start Time: 0900 End Time: 0950 Facilitators: Rosina Lowenstein, LRT, CTRS Location: Courtyard  Group Description: Reminisce Cards. Patients drew a laminated card out of a bag that had a word or phrase on it. Pt encouraged to speak about a time in their life or fond memory that specifically relates to the word they chose out of the bag. An example would be: "parenthood, meals, siblings, travel, or home".  LRT prompted following questions and encouraged contribution from peers to increase communication.   Goal Area(s) Addressed: Patient will increase verbal communication by conversing with peers. Patient will contribute to group discussion with minimal prompting. Patient will reminisce a positive memory or moment in their life  Affect/Mood: N/A   Participation Level: Did not attend    Clinical Observations/Individualized Feedback: Pt did not attend group despite encouragement. Pt shared, "I live outside, I'm not going out even more".   Plan: Continue to engage patient in RT group sessions 2-3x/week.   Rosina Lowenstein, LRT, CTRS 01/13/2023 11:16 AM

## 2023-01-13 NOTE — Progress Notes (Signed)
Patient compliant with medications denies SI/HI/A/VH and verbally contracts for safety. Q 15 minutes safety checks ongoing without self harm gestures. Support and encouragement provided.

## 2023-01-13 NOTE — Progress Notes (Signed)
Patient ID: Jeffery Gross, male   DOB: Nov 30, 1960, 62 y.o.   MRN: [id number] [facility] MD Progress Note  01/13/2023 3:47 PM POPE BRUNTY  MRN:  [id number] Subjective: Patient endorses euthymic, "good" mood today. Feels that pain today is "not as bad as it was". He denies suicidal, homicidal ideations. He denies auditory visual hallucinations or paranoia. He is reporting poor sleep, states he feels he is waking up every hour. Discussed starting trazodone 50mg  at bedtime prn for sleep. Patient is agreeable.   Principal Problem: MDD (major depressive disorder), recurrent severe, without psychosis (HCC) Diagnosis: Principal Problem:   MDD (major depressive disorder), recurrent severe, without psychosis (HCC) Active Problems:   S/P AKA (above knee amputation) bilateral (HCC)   Chronic low back pain   Cocaine abuse (HCC)   Opiate dependence (HCC)   Homelessness   Facial cellulitis  Total Time spent with patient: 30 minutes  Past Psychiatric History: Past history of longstanding drug abuse  Past Medical History:  Past Medical History:  Diagnosis Date   Back pain    Chest pain    Depression    DVT (deep venous thrombosis) (HCC)    GERD (gastroesophageal reflux disease)    History of kidney stones    passed   HTN (hypertension)    no longer has HTN    Past Surgical History:  Procedure Laterality Date   AMPUTATION Bilateral 01/09/2016   Procedure: Bilateral Above Knee Amputation, Apply Wound VAC;  Surgeon: Nadara Mustard, MD;  Location: [facility] OR;  Service: Orthopedics;  Laterality: Bilateral;   AMPUTATION Right 08/09/2022   Procedure: RIGHT REVISION AMPUTATION ABOVE KNEE;  Surgeon: Nadara Mustard, MD;  Location: [facility] OR;  Service: Orthopedics;  Laterality: Right;   FASCIOTOMY Bilateral 01/03/2016   Procedure: FASCIOTOMY;  Surgeon: Tarry Kos, MD;  Location: [facility] OR;  Service: Orthopedics;  Laterality: Bilateral;   I & D EXTREMITY Bilateral 01/05/2016   Procedure: IRRIGATION AND DEBRIDEMENT  BILATERAL LOWER EXTREMITIES; WOUND VAC CHANGE;  Surgeon: Tarry Kos, MD;  Location: [facility] OR;  Service: Orthopedics;  Laterality: Bilateral;   I & D EXTREMITY Left 01/07/2016   Procedure: Irrigation and Debridement of Left Lower Extremity with Application of Wound Vac;  Surgeon: Tarry Kos, MD;  Location: [facility] OR;  Service: Orthopedics;  Laterality: Left;   STUMP REVISION Right 07/04/2022   Procedure: REVISION RIGHT ABOVE KNEE AMPUTATION;  Surgeon: Nadara Mustard, MD;  Location: [facility] OR;  Service: Orthopedics;  Laterality: Right;   VASECTOMY     Family History:  Family History  Problem Relation Age of Onset   Hypertension Father    Family Psychiatric  History: See previous Social History:  Social History   Substance and Sexual Activity  Alcohol Use Yes   Comment: rare     Social History   Substance and Sexual Activity  Drug Use Yes   Types: Cocaine    Social History   Socioeconomic History   Marital status: Single    Spouse name: Not on file   Number of children: 3   Years of education: Not on file   Highest education level: Not on file  Occupational History   Occupation: disabled  Tobacco Use   Smoking status: Every Day    Packs/day: 0.25    Years: 5.00    Additional pack years: 0.00    Total pack years: 1.25    Types: Cigarettes   Smokeless tobacco: Current  Vaping Use   Vaping Use: Every  day   Substances: Nicotine  Substance and Sexual Activity   Alcohol use: Yes    Comment: rare   Drug use: Yes    Types: Cocaine   Sexual activity: Not on file  Other Topics Concern   Not on file  Social History Narrative   Not on file   Social Determinants of Health   Financial Resource Strain: Not on file  Food Insecurity: Food Insecurity Present (01/08/2023)   Hunger Vital Sign    Worried About Running Out of Food in the Last Year: Sometimes true    Ran Out of Food in the Last Year: Sometimes true  Transportation Needs: No Transportation Needs (01/08/2023)   PRAPARE -  Administrator, Civil Service (Medical): No    Lack of Transportation (Non-Medical): No  Physical Activity: Not on file  Stress: Not on file  Social Connections: Not on file   Additional Social History:                         Sleep: Poor  Appetite:  Fair  Current Medications: Current Facility-Administered Medications  Medication Dose Route Frequency Provider Last Rate Last Admin   acetaminophen (TYLENOL) tablet 650 mg  650 mg Oral Q6H PRN Rankin, Shuvon B, NP   650 mg at 01/10/23 0116   alum & mag hydroxide-simeth (MAALOX/MYLANTA) 200-200-20 MG/5ML suspension 30 mL  30 mL Oral Q4H PRN Rankin, Shuvon B, NP       diphenhydrAMINE (BENADRYL) capsule 50 mg  50 mg Oral TID PRN Rankin, Shuvon B, NP       Or   diphenhydrAMINE (BENADRYL) injection 50 mg  50 mg Intramuscular TID PRN Rankin, Shuvon B, NP       doxycycline (VIBRA-TABS) tablet 100 mg  100 mg Oral Q12H Clapacs, John T, MD   100 mg at 01/13/23 1610   DULoxetine (CYMBALTA) DR capsule 60 mg  60 mg Oral q AM Rankin, Shuvon B, NP   60 mg at 01/13/23 0557   gabapentin (NEURONTIN) capsule 300 mg  300 mg Oral TID Rankin, Shuvon B, NP   300 mg at 01/13/23 9604   haloperidol (HALDOL) tablet 5 mg  5 mg Oral TID PRN Rankin, Shuvon B, NP       Or   haloperidol lactate (HALDOL) injection 5 mg  5 mg Intramuscular TID PRN Rankin, Shuvon B, NP       magnesium hydroxide (MILK OF MAGNESIA) suspension 30 mL  30 mL Oral Daily PRN Rankin, Shuvon B, NP       melatonin tablet 7.5 mg  7.5 mg Oral QHS Parmar, Meenakshi, MD   7.5 mg at 01/12/23 2300   nicotine (NICODERM CQ - dosed in mg/24 hours) patch 14 mg  14 mg Transdermal Q24H Clapacs, John T, MD   14 mg at 01/13/23 5409   oxyCODONE (Oxy IR/ROXICODONE) immediate release tablet 10 mg  10 mg Oral Q6H Clapacs, John T, MD   10 mg at 01/13/23 1001   Vitamin D (Ergocalciferol) (DRISDOL) 1.25 MG (50000 UNIT) capsule 50,000 Units  50,000 Units Oral Q Fri Rankin, Shuvon B, NP   50,000 Units  at 01/09/23 8119    Lab Results:  No results found for this or any previous visit (from the past 48 hour(s)).   Blood Alcohol level:  Lab Results  Component Value Date   [facility] <10 01/07/2023   ETH <10 12/04/2022    Metabolic Disorder Labs: Lab Results  Component Value Date   HGBA1C 5.8 (H) 06/14/2022   MPG 120 06/14/2022   No results found for: "PROLACTIN" No results found for: "CHOL", "TRIG", "HDL", "CHOLHDL", "VLDL", "LDLCALC"    Psychiatric Specialty Exam:  Presentation  General Appearance:  Appropriate for Environment  Eye Contact: Fair  Speech: Clear and Coherent  Speech Volume: Normal  Mood and Affect  Mood: "Good"  Affect: Congruent   Thought Process  Thought Processes: Coherent; Goal Directed  Descriptions of Associations:Intact  Orientation:Full (Time, Place and Person)  Thought Content:Denies SI, HI, AVH, paranoia  History of Schizophrenia/Schizoaffective disorder:No  Ideas of Reference:None   Sensorium  Memory: Immediate Fair; Recent Fair; Remote Fair  Judgment: Improving  Insight: Fair   Chartered certified accountant: Fair  Attention Span: Fair  Recall: [employer] of Knowledge: Fair  Language: Fair   Assets  Assets: Communication Skills; Desire for Improvement Family support  Physical Exam: Physical Exam Constitutional:      Appearance: Normal appearance.  HENT:     Head: Normocephalic and atraumatic.     Mouth/Throat:     Pharynx: Oropharynx is clear.  Eyes:     Pupils: Pupils are equal, round, and reactive to light.  Cardiovascular:     Rate and Rhythm: Normal rate.  Pulmonary:     Effort: Pulmonary effort is normal.  Musculoskeletal:     Comments: Bilateral above-the-knee amputations  Skin:    General: Skin is warm and dry.  Neurological:     General: No focal deficit present.     Mental Status: He is alert.  Psychiatric:        Attention and Perception: Attention normal.         Speech: Speech normal.        Cognition and Memory: Cognition normal.    Review of Systems  Constitutional: Negative.   HENT: Negative.    Eyes: Negative.   Respiratory: Negative.    Cardiovascular: Negative.   Gastrointestinal: Negative.   Musculoskeletal:  Positive for back pain.  Skin: Negative.   Neurological:  Positive for tingling.   Blood pressure 123/78, pulse 74, temperature (!) 97.3 F (36.3 C), resp. rate 16, weight 70.8 kg, SpO2 97 %. Body mass index is 20.03 kg/m.   Treatment Plan Summary: Medication management and Plan  Will continue to monitor for improvement in symptoms and safety.  Continue on every 15 minute safety Checks .  Continue current medications. Start trazodone 50mg  at bedtime prn for sleep.   Lauree Chandler, NP

## 2023-01-13 NOTE — Group Note (Signed)
[facility] LCSW Group Therapy Note   Group Date: 01/13/2023 Start Time: 1315 End Time: 1400   Type of Therapy/Topic:  Group Therapy:  Balance in Life  Participation Level:  Minimal   Description of Group:    This group will address the concept of balance and how it feels and looks when one is unbalanced. Patients will be encouraged to process areas in their lives that are out of balance, and identify reasons for remaining unbalanced. Facilitators will guide patients utilizing problem- solving interventions to address and correct the stressor making their life unbalanced. Understanding and applying boundaries will be explored and addressed for obtaining  and maintaining a balanced life. Patients will be encouraged to explore ways to assertively make their unbalanced needs known to significant others in their lives, using other group members and facilitator for support and feedback.  Therapeutic Goals: Patient will identify two or more emotions or situations they have that consume much of in their lives. Patient will identify signs/triggers that life has become out of balance:  Patient will identify two ways to set boundaries in order to achieve balance in their lives:  Patient will demonstrate ability to communicate their needs through discussion and/or role plays  Summary of Patient Progress:    Pt attended group but was called out of group by a staff member. Pt was visibly irritated when coming back into the room but fortunately chose to participate in group. Pt had his eyes closed and hands crossed but as conversation continued pt was able to engage with the group and re-center after a seemingly difficult conversation. Pt agreed that good coping skills are puzzles and drawing, which he was doing the latter during group which seemed to calm him and help him to engage.     Therapeutic Modalities:   Cognitive Behavioral Therapy Solution-Focused Therapy Assertiveness Training   Elza Rafter, [place]

## 2023-01-14 DIAGNOSIS — F332 Major depressive disorder, recurrent severe without psychotic features: Secondary | ICD-10-CM | POA: Diagnosis not present

## 2023-01-14 MED ORDER — ENSURE ENLIVE PO LIQD
237.0000 mL | Freq: Two times a day (BID) | ORAL | Status: DC
  Administered 2023-01-14 – 2023-01-20 (×11): 237 mL via ORAL

## 2023-01-14 NOTE — BH IP Treatment Plan (Signed)
Interdisciplinary Treatment and Diagnostic Plan Update  01/14/2023 Time of Session: 10:15 AM JAVONNI MACKE MRN: [id number]  Principal Diagnosis: MDD (major depressive disorder), recurrent severe, without psychosis (HCC)  Secondary Diagnoses: Principal Problem:   MDD (major depressive disorder), recurrent severe, without psychosis (HCC) Active Problems:   S/P AKA (above knee amputation) bilateral (HCC)   Chronic low back pain   Cocaine abuse (HCC)   Opiate dependence (HCC)   Homelessness   Facial cellulitis   Current Medications:  Current Facility-Administered Medications  Medication Dose Route Frequency Provider Last Rate Last Admin   acetaminophen (TYLENOL) tablet 650 mg  650 mg Oral Q6H PRN Rankin, Shuvon B, NP   650 mg at 01/10/23 0116   alum & mag hydroxide-simeth (MAALOX/MYLANTA) 200-200-20 MG/5ML suspension 30 mL  30 mL Oral Q4H PRN Rankin, Shuvon B, NP       diphenhydrAMINE (BENADRYL) capsule 50 mg  50 mg Oral TID PRN Rankin, Shuvon B, NP       Or   diphenhydrAMINE (BENADRYL) injection 50 mg  50 mg Intramuscular TID PRN Rankin, Shuvon B, NP       doxycycline (VIBRA-TABS) tablet 100 mg  100 mg Oral Q12H Clapacs, John T, MD   100 mg at 01/14/23 1018   DULoxetine (CYMBALTA) DR capsule 60 mg  60 mg Oral q AM Rankin, Shuvon B, NP   60 mg at 01/14/23 8182   gabapentin (NEURONTIN) capsule 300 mg  300 mg Oral TID Rankin, Shuvon B, NP   300 mg at 01/14/23 1019   haloperidol (HALDOL) tablet 5 mg  5 mg Oral TID PRN Rankin, Shuvon B, NP       Or   haloperidol lactate (HALDOL) injection 5 mg  5 mg Intramuscular TID PRN Rankin, Shuvon B, NP       magnesium hydroxide (MILK OF MAGNESIA) suspension 30 mL  30 mL Oral Daily PRN Rankin, Shuvon B, NP       melatonin tablet 7.5 mg  7.5 mg Oral QHS Parmar, Meenakshi, MD   7.5 mg at 01/13/23 2300   nicotine (NICODERM CQ - dosed in mg/24 hours) patch 14 mg  14 mg Transdermal Q24H Clapacs, John T, MD   14 mg at 01/14/23 1018   oxyCODONE (Oxy  IR/ROXICODONE) immediate release tablet 10 mg  10 mg Oral Q6H Clapacs, John T, MD   10 mg at 01/14/23 1018   traZODone (DESYREL) tablet 50 mg  50 mg Oral QHS PRN Lauree Chandler, NP   50 mg at 01/13/23 2313   Vitamin D (Ergocalciferol) (DRISDOL) 1.25 MG (50000 UNIT) capsule 50,000 Units  50,000 Units Oral Q Fri Rankin, Shuvon B, NP   50,000 Units at 01/09/23 9937   PTA Medications: Medications Prior to Admission  Medication Sig Dispense Refill Last Dose   doxycycline (VIBRA-TABS) 100 MG tablet Take 1 tablet (100 mg total) by mouth 2 (two) times daily. 28 tablet 0    DULoxetine (CYMBALTA) 60 MG capsule Take 60 mg by mouth in the morning.      gabapentin (NEURONTIN) 300 MG capsule Take 300 mg by mouth 3 (three) times daily.      omeprazole (PRILOSEC) 20 MG capsule Take 20 mg by mouth in the morning.       Patient Stressors: Financial difficulties   Health problems    Patient Strengths: Capable of independent living  Communication skills   Treatment Modalities: Medication Management, Group therapy, Case management,  1 to 1 session with clinician, Psychoeducation, Recreational  therapy.   Physician Treatment Plan for Primary Diagnosis: MDD (major depressive disorder), recurrent severe, without psychosis (HCC) Long Term Goal(s): Improvement in symptoms so as ready for discharge   Short Term Goals: Ability to maintain clinical measurements within normal limits will improve Ability to disclose and discuss suicidal ideas Ability to demonstrate self-control will improve  Medication Management: Evaluate patient's response, side effects, and tolerance of medication regimen.  Therapeutic Interventions: 1 to 1 sessions, Unit Group sessions and Medication administration.  Evaluation of Outcomes: Progressing  Physician Treatment Plan for Secondary Diagnosis: Principal Problem:   MDD (major depressive disorder), recurrent severe, without psychosis (HCC) Active Problems:   S/P AKA (above  knee amputation) bilateral (HCC)   Chronic low back pain   Cocaine abuse (HCC)   Opiate dependence (HCC)   Homelessness   Facial cellulitis  Long Term Goal(s): Improvement in symptoms so as ready for discharge   Short Term Goals: Ability to maintain clinical measurements within normal limits will improve Ability to disclose and discuss suicidal ideas Ability to demonstrate self-control will improve     Medication Management: Evaluate patient's response, side effects, and tolerance of medication regimen.  Therapeutic Interventions: 1 to 1 sessions, Unit Group sessions and Medication administration.  Evaluation of Outcomes: Progressing   RN Treatment Plan for Primary Diagnosis: MDD (major depressive disorder), recurrent severe, without psychosis (HCC) Long Term Goal(s): Knowledge of disease and therapeutic regimen to maintain health will improve  Short Term Goals: Ability to demonstrate self-control, Ability to participate in decision making will improve, Ability to verbalize feelings will improve, Ability to disclose and discuss suicidal ideas, Ability to identify and develop effective coping behaviors will improve, and Compliance with prescribed medications will improve  Medication Management: RN will administer medications as ordered by provider, will assess and evaluate patient's response and provide education to patient for prescribed medication. RN will report any adverse and/or side effects to prescribing provider.  Therapeutic Interventions: 1 on 1 counseling sessions, Psychoeducation, Medication administration, Evaluate responses to treatment, Monitor vital signs and CBGs as ordered, Perform/monitor CIWA, COWS, AIMS and Fall Risk screenings as ordered, Perform wound care treatments as ordered.  Evaluation of Outcomes: Progressing   LCSW Treatment Plan for Primary Diagnosis: MDD (major depressive disorder), recurrent severe, without psychosis (HCC) Long Term Goal(s): Safe  transition to appropriate next level of care at discharge, Engage patient in therapeutic group addressing interpersonal concerns.  Short Term Goals: Engage patient in aftercare planning with referrals and resources, Increase social support, Increase ability to appropriately verbalize feelings, Increase emotional regulation, Facilitate acceptance of mental health diagnosis and concerns, Facilitate patient progression through stages of change regarding substance use diagnoses and concerns, Identify triggers associated with mental health/substance abuse issues, and Increase skills for wellness and recovery  Therapeutic Interventions: Assess for all discharge needs, 1 to 1 time with Social worker, Explore available resources and support systems, Assess for adequacy in community support network, Educate family and significant other(s) on suicide prevention, Complete Psychosocial Assessment, Interpersonal group therapy.  Evaluation of Outcomes: Progressing   Progress in Treatment: Attending groups: Yes. Participating in groups: Yes. Taking medication as prescribed: Yes. Toleration medication: Yes. Family/Significant other contact made: No, will contact:  once permission is given Patient understands diagnosis: Yes. Discussing patient identified problems/goals with staff: Yes. Medical problems stabilized or resolved: Yes. Denies suicidal/homicidal ideation: Yes. Issues/concerns per patient self-inventory: No. Other: none  New problem(s) identified: No, Describe:  none Update 01/14/2023: No changes at this time.    New Short  Term/Long Term Goal(s):  detox, elimination of symptoms of psychosis, medication management for mood stabilization; elimination of SI thoughts; development of comprehensive mental wellness/sobriety plan. Update 01/14/2023: No changes at this time.    Patient Goals:  "Hoping I wouldn't be suicidal and hurting so bad"  Update 01/14/2023: No changes at this time.    Discharge Plan  or Barriers: CSW will assist with appropriate discharge planning.  Update 01/14/2023: Patient continues to report that he would like to be discharged to an [facility], Assisted Living or shelter.    Reason for Continuation of Hospitalization: Depression Medication stabilization Suicidal ideation   Estimated Length of Stay: 1 to 7 days   Last 3 [place] Suicide Severity Risk Score: Flowsheet Row Admission (Current) from 01/08/2023 in [facility] ED from 01/07/2023 in [facility] Department at [facility] ED from 12/04/2022 in [facility] Emergency Department at [facility]  C-SSRS RISK CATEGORY Error: Q3, 4, or 5 should not be populated when Q2 is No Moderate Risk High Risk       Last PHQ 2/9 Scores:    02/15/2016    1:54 PM 02/08/2016   11:08 AM  Depression screen PHQ 2/9  Decreased Interest 0 0  Down, Depressed, Hopeless 0 0  PHQ - 2 Score 0 0  Altered sleeping 1   Tired, decreased energy 0   Change in appetite 0   Feeling bad or failure about yourself  0   Trouble concentrating 0   Moving slowly or fidgety/restless 0   Suicidal thoughts 0   PHQ-9 Score 1   Difficult doing work/chores Not difficult at all     Scribe for Treatment Team: Harden Mo, LCSW 01/14/2023 12:20 PM

## 2023-01-14 NOTE — Progress Notes (Signed)
Patient compliant with medication is in happy mood visible in milieu denies SI/HI/A/VH and verbally contracted for safety. Patient woke up at 2 am stated he just wanted to check time to see if its 5 am to take his medications stated he is still not sleeping through the night even after taking Trazodone this night. Patient reassured and encouraged. Q 15 minutes safety checks ongoing Patient remains safe.

## 2023-01-14 NOTE — Group Note (Signed)
Date:  01/14/2023 Time:  10:05 AM  Group Topic/Focus:  Ice breaker & morning movement     This morning we introduced ourselves with a icebreaker then proceeded to talk about community & personal expectations while being on the [place] unit. Afterwards we proceeded to go outside for some fresh air while stretching, listening to music and play games.     Participation Level:  Active  Participation Quality:  Attentive  Affect:  Appropriate and Flat  Cognitive:  Alert  Insight: Good  Engagement in Group:  Distracting  Modes of Intervention:  Activity  Additional Comments:     Alexis Frock 01/14/2023, 10:05 AM

## 2023-01-14 NOTE — Progress Notes (Signed)
[facility] MD Progress Note  01/14/2023 1:11 PM Jeffery Gross  MRN:  [id number] Subjective: This is my first meeting with Jeffery Gross.  He is a double amputee and is currently homeless.  He has a history of MRSA.  He is currently on doxycycline.  He came from [facility] with chronic pain and is also on Cymbalta, Neurontin, melatonin, oxycodone, and trazodone.  He receives Norco as needed.  He feels hopeless and helpless and currently homeless.  He denies any suicidal ideation at this time.  He is tolerating his medications.  He asks if he can have some Ensure. Principal Problem: MDD (major depressive disorder), recurrent severe, without psychosis (HCC) Diagnosis: Principal Problem:   MDD (major depressive disorder), recurrent severe, without psychosis (HCC) Active Problems:   S/P AKA (above knee amputation) bilateral (HCC)   Chronic low back pain   Cocaine abuse (HCC)   Opiate dependence (HCC)   Homelessness   Facial cellulitis  Total Time spent with patient: 15 minutes  Past Psychiatric History: Frequent trips to the emergency room but no outpatient treatment.  Past Medical History:  Past Medical History:  Diagnosis Date   Back pain    Chest pain    Depression    DVT (deep venous thrombosis) (HCC)    GERD (gastroesophageal reflux disease)    History of kidney stones    passed   HTN (hypertension)    no longer has HTN    Past Surgical History:  Procedure Laterality Date   AMPUTATION Bilateral 01/09/2016   Procedure: Bilateral Above Knee Amputation, Apply Wound VAC;  Surgeon: Nadara Mustard, MD;  Location: [facility] OR;  Service: Orthopedics;  Laterality: Bilateral;   AMPUTATION Right 08/09/2022   Procedure: RIGHT REVISION AMPUTATION ABOVE KNEE;  Surgeon: Nadara Mustard, MD;  Location: [facility] OR;  Service: Orthopedics;  Laterality: Right;   FASCIOTOMY Bilateral 01/03/2016   Procedure: FASCIOTOMY;  Surgeon: Tarry Kos, MD;  Location: [facility] OR;  Service: Orthopedics;  Laterality: Bilateral;   I & D  EXTREMITY Bilateral 01/05/2016   Procedure: IRRIGATION AND DEBRIDEMENT BILATERAL LOWER EXTREMITIES; WOUND VAC CHANGE;  Surgeon: Tarry Kos, MD;  Location: [facility] OR;  Service: Orthopedics;  Laterality: Bilateral;   I & D EXTREMITY Left 01/07/2016   Procedure: Irrigation and Debridement of Left Lower Extremity with Application of Wound Vac;  Surgeon: Tarry Kos, MD;  Location: [facility] OR;  Service: Orthopedics;  Laterality: Left;   STUMP REVISION Right 07/04/2022   Procedure: REVISION RIGHT ABOVE KNEE AMPUTATION;  Surgeon: Nadara Mustard, MD;  Location: [facility] OR;  Service: Orthopedics;  Laterality: Right;   VASECTOMY     Family History:  Family History  Problem Relation Age of Onset   Hypertension Father    Family Psychiatric  History: Unremarkable Social History:  Social History   Substance and Sexual Activity  Alcohol Use Yes   Comment: rare     Social History   Substance and Sexual Activity  Drug Use Yes   Types: Cocaine    Social History   Socioeconomic History   Marital status: Single    Spouse name: Not on file   Number of children: 3   Years of education: Not on file   Highest education level: Not on file  Occupational History   Occupation: disabled  Tobacco Use   Smoking status: Every Day    Packs/day: 0.25    Years: 5.00    Additional pack years: 0.00    Total pack years: 1.25  Types: Cigarettes   Smokeless tobacco: Current  Vaping Use   Vaping Use: Every day   Substances: Nicotine  Substance and Sexual Activity   Alcohol use: Yes    Comment: rare   Drug use: Yes    Types: Cocaine   Sexual activity: Not on file  Other Topics Concern   Not on file  Social History Narrative   Not on file   Social Determinants of Health   Financial Resource Strain: Not on file  Food Insecurity: Food Insecurity Present (01/08/2023)   Hunger Vital Sign    Worried About Running Out of Food in the Last Year: Sometimes true    Ran Out of Food in the Last Year: Sometimes true   Transportation Needs: No Transportation Needs (01/08/2023)   PRAPARE - Administrator, Civil Service (Medical): No    Lack of Transportation (Non-Medical): No  Physical Activity: Not on file  Stress: Not on file  Social Connections: Not on file   Additional Social History:                         Sleep: Good  Appetite:  Good  Current Medications: Current Facility-Administered Medications  Medication Dose Route Frequency Provider Last Rate Last Admin   acetaminophen (TYLENOL) tablet 650 mg  650 mg Oral Q6H PRN Rankin, Shuvon B, NP   650 mg at 01/10/23 0116   alum & mag hydroxide-simeth (MAALOX/MYLANTA) 200-200-20 MG/5ML suspension 30 mL  30 mL Oral Q4H PRN Rankin, Shuvon B, NP       diphenhydrAMINE (BENADRYL) capsule 50 mg  50 mg Oral TID PRN Rankin, Shuvon B, NP       Or   diphenhydrAMINE (BENADRYL) injection 50 mg  50 mg Intramuscular TID PRN Rankin, Shuvon B, NP       doxycycline (VIBRA-TABS) tablet 100 mg  100 mg Oral Q12H Clapacs, John T, MD   100 mg at 01/14/23 1018   DULoxetine (CYMBALTA) DR capsule 60 mg  60 mg Oral q AM Rankin, Shuvon B, NP   60 mg at 01/14/23 2595   feeding supplement (ENSURE ENLIVE / ENSURE PLUS) liquid 237 mL  237 mL Oral BID BM Sarina Ill, DO       gabapentin (NEURONTIN) capsule 300 mg  300 mg Oral TID Rankin, Shuvon B, NP   300 mg at 01/14/23 1019   haloperidol (HALDOL) tablet 5 mg  5 mg Oral TID PRN Rankin, Shuvon B, NP       Or   haloperidol lactate (HALDOL) injection 5 mg  5 mg Intramuscular TID PRN Rankin, Shuvon B, NP       magnesium hydroxide (MILK OF MAGNESIA) suspension 30 mL  30 mL Oral Daily PRN Rankin, Shuvon B, NP       melatonin tablet 7.5 mg  7.5 mg Oral QHS Parmar, Meenakshi, MD   7.5 mg at 01/13/23 2300   nicotine (NICODERM CQ - dosed in mg/24 hours) patch 14 mg  14 mg Transdermal Q24H Clapacs, John T, MD   14 mg at 01/14/23 1018   oxyCODONE (Oxy IR/ROXICODONE) immediate release tablet 10 mg  10 mg Oral  Q6H Clapacs, John T, MD   10 mg at 01/14/23 1018   traZODone (DESYREL) tablet 50 mg  50 mg Oral QHS PRN Lauree Chandler, NP   50 mg at 01/13/23 2313   Vitamin D (Ergocalciferol) (DRISDOL) 1.25 MG (50000 UNIT) capsule 50,000 Units  50,000 Units  Oral Q Fri Rankin, Shuvon B, NP   50,000 Units at 01/09/23 6578    Lab Results: No results found for this or any previous visit (from the past 48 hour(s)).  Blood Alcohol level:  Lab Results  Component Value Date   ETH <10 01/07/2023   ETH <10 12/04/2022    Metabolic Disorder Labs: Lab Results  Component Value Date   HGBA1C 5.8 (H) 06/14/2022   MPG 120 06/14/2022   No results found for: "PROLACTIN" No results found for: "CHOL", "TRIG", "HDL", "CHOLHDL", "VLDL", "LDLCALC"  Physical Findings: AIMS:  , ,  ,  ,    CIWA:    COWS:     Musculoskeletal: Strength & Muscle Tone: within normal limits Gait & Station: normal Patient leans: N/A  Psychiatric Specialty Exam:  Presentation  General Appearance:  Appropriate for Environment  Eye Contact: Fair  Speech: Clear and Coherent  Speech Volume: Increased  Handedness:No data recorded  Mood and Affect  Mood: Dysphoric  Affect: Congruent   Thought Process  Thought Processes: Coherent; Goal Directed  Descriptions of Associations:Intact  Orientation:Full (Time, Place and Person)  Thought Content:Logical  History of Schizophrenia/Schizoaffective disorder:No  Duration of Psychotic Symptoms:No data recorded Hallucinations:No data recorded Ideas of Reference:None  Suicidal Thoughts:No data recorded Homicidal Thoughts:No data recorded  Sensorium  Memory: Immediate Fair; Recent Fair; Remote Fair  Judgment: Poor  Insight: Poor   Executive Functions  Concentration: Fair  Attention Span: Fair  Recall: [employer] of Knowledge: Fair  Language: Fair   Psychomotor Activity  Psychomotor Activity:No data recorded  Assets  Assets: Communication  Skills; Desire for Improvement   Sleep  Sleep:No data recorded   Physical Exam: Physical Exam Vitals and nursing note reviewed.  Constitutional:      Appearance: Normal appearance. He is normal weight.  Neurological:     General: No focal deficit present.     Mental Status: He is alert and oriented to person, place, and time.  Psychiatric:        Attention and Perception: Attention and perception normal.        Mood and Affect: Mood is depressed. Affect is flat.        Speech: Speech normal.        Behavior: Behavior normal. Behavior is cooperative.        Thought Content: Thought content normal.        Cognition and Memory: Cognition and memory normal.        Judgment: Judgment normal.    Review of Systems  Constitutional: Negative.   HENT: Negative.    Eyes: Negative.   Respiratory: Negative.    Cardiovascular: Negative.   Gastrointestinal: Negative.   Genitourinary: Negative.   Musculoskeletal: Negative.   Skin: Negative.   Neurological: Negative.   Endo/Heme/Allergies: Negative.   Psychiatric/Behavioral:  Positive for depression.    Blood pressure 111/69, pulse 63, temperature 98.1 F (36.7 C), temperature source Oral, resp. rate 18, weight 70.8 kg, SpO2 96 %. Body mass index is 20.03 kg/m.   Treatment Plan Summary: Daily contact with patient to assess and evaluate symptoms and progress in treatment, Medication management, and Plan start Ensure in between meals.  Continue current medications.  Sarina Ill, DO 01/14/2023, 1:11 PM

## 2023-01-14 NOTE — Group Note (Signed)
Date:  01/14/2023 Time:  7:04 AM  Group Topic/Focus:  Building Self Esteem:   The Focus of this group is helping patients become aware of the effects of self-esteem on their lives, the things they and others do that enhance or undermine their self-esteem, seeing the relationship between their level of self-esteem and the choices they make and learning ways to enhance self-esteem. Coping With Mental Health Crisis:   The purpose of this group is to help patients identify strategies for coping with mental health crisis.  Group discusses possible causes of crisis and ways to manage them effectively. Developing a Wellness Toolbox:   The focus of this group is to help patients develop a "wellness toolbox" with skills and strategies to promote recovery upon discharge. Healthy Communication:   The focus of this group is to discuss communication, barriers to communication, as well as healthy ways to communicate with others. Identifying Needs:   The focus of this group is to help patients identify their personal needs that have been historically problematic and identify healthy behaviors to address their needs. Making Healthy Choices:   The focus of this group is to help patients identify negative/unhealthy choices they were using prior to admission and identify positive/healthier coping strategies to replace them upon discharge. Managing Feelings:   The focus of this group is to identify what feelings patients have difficulty handling and develop a plan to handle them in a healthier way upon discharge.    Participation Level:  Active  Participation Quality:  Appropriate  Affect:  Appropriate  Cognitive:  Appropriate  Insight: Appropriate  Engagement in Group:  Engaged and Supportive  Modes of Intervention:  Discussion and Support  Additional Comments:    Jeffery Gross 01/14/2023, 7:04 AM

## 2023-01-14 NOTE — Progress Notes (Signed)
Assumed care of patient this am, he was pleasant and cooperative on approach, affect bright, denied thoughts of self harm and verbally committed to safety. Patient c/o back pain, 5-6/10, requested his Oxy early and was accommodated within of scheduled dose. Patient appetite and hygiene seems adequate and he was without any other complaints, will continue to monitor for safety and support per POC.

## 2023-01-15 DIAGNOSIS — F332 Major depressive disorder, recurrent severe without psychotic features: Secondary | ICD-10-CM | POA: Diagnosis not present

## 2023-01-15 MED ORDER — TRAZODONE HCL 100 MG PO TABS
100.0000 mg | ORAL_TABLET | Freq: Every evening | ORAL | Status: DC | PRN
  Administered 2023-01-15 – 2023-01-19 (×3): 100 mg via ORAL
  Filled 2023-01-15 (×3): qty 1

## 2023-01-15 NOTE — B[facility] Counselor (Signed)
CSW attempted to meet with the patient to discuss aftercare plans.    CSW asked if patient wanted to purse [facility] as patient's previous request for inpatient treatment for substance has not been successful.   Patient stated that he did not wish to address his substance use as per patient "it was a one time thing when I was trying to kill myself".  Patient then stated "I ain't going to the shelter in [place]. [Another patient] said that it's not fit for nobody and that it's in the hood."  CSW expressed understanding and asked patient if he wanted to pursue the [facility] shelter.  Patient repeated statement on [facility] shelter.  CSW reframed however, patient continued to discuss [facility].  CSW discussed with patient that discharge may be approaching and decisions need to be made on placement. Patient stated "just open the door and wheel me out".  CSW attempted to explain that this is not a safe discharge.  CSW stated that conversation needs to be had on shelter options, patient stated that he wanted to go to [facility].  CSW informed there is not a shelter in [facility] and hospital would not be able to provide transportation without a ride. CSW asked if patient had any contacts in [place] that he could stay with or provide transportation. Patient stated that he did not and reiterated that he was not going to the [facility] in [facility].  NT came to assist patient, however patient was frustrated at this point.  NT encouraged patient to formulate his own opinion as people are different and he may have a different perception than others.    CSW attempted to support and pt began to speak over CSW.  CSW asked patient to allow CSW to finish so increase understanding.    Patient laid in bed with his eyes closed and turned away.  When CSW finished explaining she sought to assess the patient's understanding. Patient stated "Oh I can talk now?"  CSW  explained that both can not talk at the same time and have understanding, CSW is trying to assist the patient in increasing understanding and making a decision.    NT again attempted to assist however, patient began to talk over her as well.  CSW terminated the conversation as it was no longer productive.  CSW to follow up with patient at a later date.  Penni Homans, MSW, LCSW 01/15/2023 4:58 PM

## 2023-01-15 NOTE — Group Note (Signed)
Date:  01/15/2023 Time:  10:26 AM  Group Topic/Focus:  Crisis Planning:   The purpose of this group is to help patients create a crisis plan for use upon discharge or in the future, as needed.    Participation Level:  Did Not Attend  Participation Quality:    Affect:    Cognitive:    Insight:   Engagement in Group:    Modes of Intervention:    Additional Comments:  Dids not attend  Chioke Noxon T Noe Gens 01/15/2023, 10:26 AM

## 2023-01-15 NOTE — Plan of Care (Signed)
  Problem: Safety: Goal: Ability to remain free from injury will improve Outcome: Progressing   Problem: Skin Integrity: Goal: Risk for impaired skin integrity will decrease Outcome: Progressing   Problem: Education: Goal: Ability to make informed decisions regarding treatment will improve Outcome: Progressing  Pt interacting well with Peers Denies SI/HI/A/VH and verbally contracted for safety. Patient is pleasant and compliant with treatment plan. Stated he [place].  Support and encouragement provided.

## 2023-01-15 NOTE — Progress Notes (Signed)
[facility] MD Progress Note  01/15/2023 1:53 PM Jeffery Gross  MRN:  [id number] Subjective: Jeffery Gross is seen on rounds.  As per his plans were when he is discharged he says he is homeless and [place].  He has no plans.  He denies any suicidal ideation.  His biggest complaint is that he is not sleeping well.  Talked about going up on his trazodone. Principal Problem: MDD (major depressive disorder), recurrent severe, without psychosis (HCC) Diagnosis: Principal Problem:   MDD (major depressive disorder), recurrent severe, without psychosis (HCC) Active Problems:   S/P AKA (above knee amputation) bilateral (HCC)   Chronic low back pain   Cocaine abuse (HCC)   Opiate dependence (HCC)   Homelessness   Facial cellulitis  Total Time spent with patient: 15 minutes  Past Psychiatric History: Depression  Past Medical History:  Past Medical History:  Diagnosis Date   Back pain    Chest pain    Depression    DVT (deep venous thrombosis) (HCC)    GERD (gastroesophageal reflux disease)    History of kidney stones    passed   HTN (hypertension)    no longer has HTN    Past Surgical History:  Procedure Laterality Date   AMPUTATION Bilateral 01/09/2016   Procedure: Bilateral Above Knee Amputation, Apply Wound VAC;  Surgeon: Nadara Mustard, MD;  Location: [facility] OR;  Service: Orthopedics;  Laterality: Bilateral;   AMPUTATION Right 08/09/2022   Procedure: RIGHT REVISION AMPUTATION ABOVE KNEE;  Surgeon: Nadara Mustard, MD;  Location: [facility] OR;  Service: Orthopedics;  Laterality: Right;   FASCIOTOMY Bilateral 01/03/2016   Procedure: FASCIOTOMY;  Surgeon: Tarry Kos, MD;  Location: [facility] OR;  Service: Orthopedics;  Laterality: Bilateral;   I & D EXTREMITY Bilateral 01/05/2016   Procedure: IRRIGATION AND DEBRIDEMENT BILATERAL LOWER EXTREMITIES; WOUND VAC CHANGE;  Surgeon: Tarry Kos, MD;  Location: [facility] OR;  Service: Orthopedics;  Laterality: Bilateral;   I & D EXTREMITY Left 01/07/2016   Procedure: Irrigation and  Debridement of Left Lower Extremity with Application of Wound Vac;  Surgeon: Tarry Kos, MD;  Location: [facility] OR;  Service: Orthopedics;  Laterality: Left;   STUMP REVISION Right 07/04/2022   Procedure: REVISION RIGHT ABOVE KNEE AMPUTATION;  Surgeon: Nadara Mustard, MD;  Location: [facility] OR;  Service: Orthopedics;  Laterality: Right;   VASECTOMY     Family History:  Family History  Problem Relation Age of Onset   Hypertension Father    Family Psychiatric  History: Unremarkable Social History:  Social History   Substance and Sexual Activity  Alcohol Use Yes   Comment: rare     Social History   Substance and Sexual Activity  Drug Use Yes   Types: Cocaine    Social History   Socioeconomic History   Marital status: Single    Spouse name: Not on file   Number of children: 3   Years of education: Not on file   Highest education level: Not on file  Occupational History   Occupation: disabled  Tobacco Use   Smoking status: Every Day    Packs/day: 0.25    Years: 5.00    Additional pack years: 0.00    Total pack years: 1.25    Types: Cigarettes   Smokeless tobacco: Current  Vaping Use   Vaping Use: Every day   Substances: Nicotine  Substance and Sexual Activity   Alcohol use: Yes    Comment: rare   Drug use: Yes  Types: Cocaine   Sexual activity: Not on file  Other Topics Concern   Not on file  Social History Narrative   Not on file   Social Determinants of Health   Financial Resource Strain: Not on file  Food Insecurity: Food Insecurity Present (01/08/2023)   Hunger Vital Sign    Worried About Running Out of Food in the Last Year: Sometimes true    Ran Out of Food in the Last Year: Sometimes true  Transportation Needs: No Transportation Needs (01/08/2023)   PRAPARE - Administrator, Civil Service (Medical): No    Lack of Transportation (Non-Medical): No  Physical Activity: Not on file  Stress: Not on file  Social Connections: Not on file    Additional Social History:                         Sleep: Fair  Appetite:  Fair  Current Medications: Current Facility-Administered Medications  Medication Dose Route Frequency Provider Last Rate Last Admin   acetaminophen (TYLENOL) tablet 650 mg  650 mg Oral Q6H PRN Rankin, Shuvon B, NP   650 mg at 01/10/23 0116   alum & mag hydroxide-simeth (MAALOX/MYLANTA) 200-200-20 MG/5ML suspension 30 mL  30 mL Oral Q4H PRN Rankin, Shuvon B, NP       diphenhydrAMINE (BENADRYL) capsule 50 mg  50 mg Oral TID PRN Rankin, Shuvon B, NP       Or   diphenhydrAMINE (BENADRYL) injection 50 mg  50 mg Intramuscular TID PRN Rankin, Shuvon B, NP       doxycycline (VIBRA-TABS) tablet 100 mg  100 mg Oral Q12H Clapacs, John T, MD   100 mg at 01/15/23 0906   DULoxetine (CYMBALTA) DR capsule 60 mg  60 mg Oral q AM Rankin, Shuvon B, NP   60 mg at 01/15/23 0601   feeding supplement (ENSURE ENLIVE / ENSURE PLUS) liquid 237 mL  237 mL Oral BID BM Sarina Ill, DO   237 mL at 01/15/23 0909   gabapentin (NEURONTIN) capsule 300 mg  300 mg Oral TID Rankin, Shuvon B, NP   300 mg at 01/15/23 8295   haloperidol (HALDOL) tablet 5 mg  5 mg Oral TID PRN Rankin, Shuvon B, NP       Or   haloperidol lactate (HALDOL) injection 5 mg  5 mg Intramuscular TID PRN Rankin, Shuvon B, NP       magnesium hydroxide (MILK OF MAGNESIA) suspension 30 mL  30 mL Oral Daily PRN Rankin, Shuvon B, NP       melatonin tablet 7.5 mg  7.5 mg Oral QHS Parmar, Meenakshi, MD   7.5 mg at 01/14/23 2300   nicotine (NICODERM CQ - dosed in mg/24 hours) patch 14 mg  14 mg Transdermal Q24H Clapacs, John T, MD   14 mg at 01/15/23 6213   oxyCODONE (Oxy IR/ROXICODONE) immediate release tablet 10 mg  10 mg Oral Q6H Clapacs, John T, MD   10 mg at 01/15/23 1048   traZODone (DESYREL) tablet 100 mg  100 mg Oral QHS PRN Sarina Ill, DO       Vitamin D (Ergocalciferol) (DRISDOL) 1.25 MG (50000 UNIT) capsule 50,000 Units  50,000 Units Oral Q  Fri Rankin, Shuvon B, NP   50,000 Units at 01/09/23 0865    Lab Results: No results found for this or any previous visit (from the past 48 hour(s)).  Blood Alcohol level:  Lab Results  Component Value  Date   ETH <10 01/07/2023   ETH <10 12/04/2022    Metabolic Disorder Labs: Lab Results  Component Value Date   HGBA1C 5.8 (H) 06/14/2022   MPG 120 06/14/2022   No results found for: "PROLACTIN" No results found for: "CHOL", "TRIG", "HDL", "CHOLHDL", "VLDL", "LDLCALC"  Physical Findings: AIMS:  , ,  ,  ,    CIWA:    COWS:     Musculoskeletal: Strength & Muscle Tone: within normal limits Gait & Station: normal Patient leans: N/A  Psychiatric Specialty Exam:  Presentation  General Appearance:  Appropriate for Environment  Eye Contact: Fair  Speech: Clear and Coherent  Speech Volume: Increased  Handedness:No data recorded  Mood and Affect  Mood: Dysphoric  Affect: Congruent   Thought Process  Thought Processes: Coherent; Goal Directed  Descriptions of Associations:Intact  Orientation:Full (Time, Place and Person)  Thought Content:Logical  History of Schizophrenia/Schizoaffective disorder:No  Duration of Psychotic Symptoms:No data recorded Hallucinations:No data recorded Ideas of Reference:None  Suicidal Thoughts:No data recorded Homicidal Thoughts:No data recorded  Sensorium  Memory: Immediate Fair; Recent Fair; Remote Fair  Judgment: Poor  Insight: Poor   Executive Functions  Concentration: Fair  Attention Span: Fair  Recall: [employer] of Knowledge: Fair  Language: Fair   Psychomotor Activity  Psychomotor Activity:No data recorded  Assets  Assets: Communication Skills; Desire for Improvement   Sleep  Sleep:No data recorded  Blood pressure 108/72, pulse 70, temperature 99.9 F (37.7 C), resp. rate 18, weight 70.8 kg, SpO2 94 %. Body mass index is 20.03 kg/m.   Treatment Plan Summary: Daily contact with  patient to assess and evaluate symptoms and progress in treatment, Medication management, and Plan increase trazodone 100 mg at bedtime as needed for sleep.  Sarina Ill, DO 01/15/2023, 1:53 PM

## 2023-01-15 NOTE — Progress Notes (Signed)
Patient was slightly irritable this morning.  Reports he did not sleep well.  "Staff kept slamming doors.  No one was considerate of [place]."   Denies SI,HI and AVH.  Patient to dayroom for meals.  Isolated to room this afternoon after becoming agitated with SW.  Compliant with scheduled medications.  15 min checks in place for safety.    Patient voiced complaints about staff being "out to get him," but could not give any specific details.   Patient had a more pleasant attitude this evening.  Interacting with peers appropriately.

## 2023-01-15 NOTE — Group Note (Signed)
Recreation Therapy Group Note   Group Topic:Health and Wellness  Group Date: 01/15/2023 Start Time: 1400 End Time: 1455 Facilitators: Rosina Lowenstein, LRT, CTRS Location:  Day Room  Group Description: Seated Exercise. LRT discussed the mental and physical benefits of exercise. LRT and group discussed how physical activity can be used as a coping skill. Pt's and LRT followed along to an exercise video on the TV screen that provided a visual representation and audio description of every exercise performed. Pt's encouraged to listen to their bodies and stop at any time if they experience feelings of discomfort or pain. LRT passed out water after session was over and encouraged pts do drink and stay hydrated.  Goal Area(s) Addressed: Patient will learn benefits of physical activity. Patient will identify exercise as a coping skill.  Patient will follow multistep directions. Patient will try a new leisure interest.   Affect/Mood: N/A   Participation Level: Did not attend    Clinical Observations/Individualized Feedback: Jeffery Gross did not attend group.  Plan: Continue to engage patient in RT group sessions 2-3x/week.   Rosina Lowenstein, LRT, CTRS 01/15/2023 3:48 PM

## 2023-01-15 NOTE — Group Note (Signed)
Date:  01/15/2023 Time:  10:26 PM  Group Topic/Focus:  Crisis Planning:   The purpose of this group is to help patients create a crisis plan for use upon discharge or in the future, as needed.    Participation Level:  Active  Participation Quality:  Appropriate  Affect:  Appropriate  Cognitive:  Appropriate  Insight: Good  Engagement in Group:  Engaged  Modes of Intervention:  Education  Additional Comments:    [name] 01/15/2023, 10:26 PM

## 2023-01-15 NOTE — Plan of Care (Signed)
  Problem: Education: Goal: Knowledge of General Education information will improve Description: Including pain rating scale, medication(s)/side effects and non-pharmacologic comfort measures Outcome: Progressing   Problem: Clinical Measurements: Goal: Ability to maintain clinical measurements within normal limits will improve Outcome: Progressing   Problem: Nutrition: Goal: Adequate nutrition will be maintained Outcome: Progressing   Problem: Coping: Goal: Level of anxiety will decrease Outcome: Progressing   Problem: Pain Managment: Goal: General experience of comfort will improve Outcome: Progressing   

## 2023-01-16 DIAGNOSIS — F332 Major depressive disorder, recurrent severe without psychotic features: Secondary | ICD-10-CM | POA: Diagnosis not present

## 2023-01-16 MED ORDER — NICOTINE POLACRILEX 2 MG MT GUM
2.0000 mg | CHEWING_GUM | OROMUCOSAL | Status: DC | PRN
  Administered 2023-01-16 – 2023-01-20 (×11): 2 mg via ORAL
  Filled 2023-01-16 (×11): qty 1

## 2023-01-16 MED ORDER — DOXEPIN HCL 50 MG PO CAPS
50.0000 mg | ORAL_CAPSULE | Freq: Every day | ORAL | Status: DC
  Administered 2023-01-16 – 2023-01-17 (×2): 50 mg via ORAL
  Filled 2023-01-16 (×2): qty 1

## 2023-01-16 NOTE — Plan of Care (Signed)
  Problem: Education: Goal: Knowledge of General Education information will improve Description: Including pain rating scale, medication(s)/side effects and non-pharmacologic comfort measures Outcome: Progressing   Problem: Activity: Goal: Risk for activity intolerance will decrease Outcome: Progressing   Problem: Nutrition: Goal: Adequate nutrition will be maintained Outcome: Progressing   Problem: Coping: Goal: Level of anxiety will decrease Outcome: Progressing   

## 2023-01-16 NOTE — Progress Notes (Signed)
Patient with irritable affect.  Reports poor sleep.  Patient is fixated on this unit being racist.  Per patient,  not being able to share food between patients is racism.  He continues to complian about the rules of the unit to other patients.   Denies SI,HI and AVH.  Compliant with scheduled medications.  15 min checks in place for safety.  Patient was able to speak with unit manager and voice his concerns.  Patient's attitude has improved since this conversation.

## 2023-01-16 NOTE — Progress Notes (Signed)
[facility] MD Progress Note  01/16/2023 1:26 PM Jeffery Gross  MRN:  [id number] Subjective: Jeffery Gross is seen on rounds.  He is still having trouble sleeping at night.  Says the higher dose of trazodone did not help.  I talked to him about doxepin which he has not been on.  I think that might be most helpful with his chronic pain, also. Principal Problem: MDD (major depressive disorder), recurrent severe, without psychosis (HCC) Diagnosis: Principal Problem:   MDD (major depressive disorder), recurrent severe, without psychosis (HCC) Active Problems:   S/P AKA (above knee amputation) bilateral (HCC)   Chronic low back pain   Cocaine abuse (HCC)   Opiate dependence (HCC)   Homelessness   Facial cellulitis  Total Time spent with patient: 15 minutes  Past Psychiatric History: Depression  Past Medical History:  Past Medical History:  Diagnosis Date   Back pain    Chest pain    Depression    DVT (deep venous thrombosis) (HCC)    GERD (gastroesophageal reflux disease)    History of kidney stones    passed   HTN (hypertension)    no longer has HTN    Past Surgical History:  Procedure Laterality Date   AMPUTATION Bilateral 01/09/2016   Procedure: Bilateral Above Knee Amputation, Apply Wound VAC;  Surgeon: Nadara Mustard, MD;  Location: [facility] OR;  Service: Orthopedics;  Laterality: Bilateral;   AMPUTATION Right 08/09/2022   Procedure: RIGHT REVISION AMPUTATION ABOVE KNEE;  Surgeon: Nadara Mustard, MD;  Location: [facility] OR;  Service: Orthopedics;  Laterality: Right;   FASCIOTOMY Bilateral 01/03/2016   Procedure: FASCIOTOMY;  Surgeon: Tarry Kos, MD;  Location: [facility] OR;  Service: Orthopedics;  Laterality: Bilateral;   I & D EXTREMITY Bilateral 01/05/2016   Procedure: IRRIGATION AND DEBRIDEMENT BILATERAL LOWER EXTREMITIES; WOUND VAC CHANGE;  Surgeon: Tarry Kos, MD;  Location: [facility] OR;  Service: Orthopedics;  Laterality: Bilateral;   I & D EXTREMITY Left 01/07/2016   Procedure: Irrigation and Debridement of  Left Lower Extremity with Application of Wound Vac;  Surgeon: Tarry Kos, MD;  Location: [facility] OR;  Service: Orthopedics;  Laterality: Left;   STUMP REVISION Right 07/04/2022   Procedure: REVISION RIGHT ABOVE KNEE AMPUTATION;  Surgeon: Nadara Mustard, MD;  Location: [facility] OR;  Service: Orthopedics;  Laterality: Right;   VASECTOMY     Family History:  Family History  Problem Relation Age of Onset   Hypertension Father    Family Psychiatric  History: Unremarkable Social History:  Social History   Substance and Sexual Activity  Alcohol Use Yes   Comment: rare     Social History   Substance and Sexual Activity  Drug Use Yes   Types: Cocaine    Social History   Socioeconomic History   Marital status: Single    Spouse name: Not on file   Number of children: 3   Years of education: Not on file   Highest education level: Not on file  Occupational History   Occupation: disabled  Tobacco Use   Smoking status: Every Day    Packs/day: 0.25    Years: 5.00    Additional pack years: 0.00    Total pack years: 1.25    Types: Cigarettes   Smokeless tobacco: Current  Vaping Use   Vaping Use: Every day   Substances: Nicotine  Substance and Sexual Activity   Alcohol use: Yes    Comment: rare   Drug use: Yes    Types:  Cocaine   Sexual activity: Not on file  Other Topics Concern   Not on file  Social History Narrative   Not on file   Social Determinants of Health   Financial Resource Strain: Not on file  Food Insecurity: Food Insecurity Present (01/08/2023)   Hunger Vital Sign    Worried About Running Out of Food in the Last Year: Sometimes true    Ran Out of Food in the Last Year: Sometimes true  Transportation Needs: No Transportation Needs (01/08/2023)   PRAPARE - Administrator, Civil Service (Medical): No    Lack of Transportation (Non-Medical): No  Physical Activity: Not on file  Stress: Not on file  Social Connections: Not on file   Additional Social  History:                         Sleep: Poor  Appetite:  Fair  Current Medications: Current Facility-Administered Medications  Medication Dose Route Frequency Provider Last Rate Last Admin   acetaminophen (TYLENOL) tablet 650 mg  650 mg Oral Q6H PRN Rankin, Shuvon B, NP   650 mg at 01/10/23 0116   alum & mag hydroxide-simeth (MAALOX/MYLANTA) 200-200-20 MG/5ML suspension 30 mL  30 mL Oral Q4H PRN Rankin, Shuvon B, NP       diphenhydrAMINE (BENADRYL) capsule 50 mg  50 mg Oral TID PRN Rankin, Shuvon B, NP       Or   diphenhydrAMINE (BENADRYL) injection 50 mg  50 mg Intramuscular TID PRN Rankin, Shuvon B, NP       doxycycline (VIBRA-TABS) tablet 100 mg  100 mg Oral Q12H Clapacs, John T, MD   100 mg at 01/16/23 0906   DULoxetine (CYMBALTA) DR capsule 60 mg  60 mg Oral q AM Rankin, Shuvon B, NP   60 mg at 01/16/23 0906   feeding supplement (ENSURE ENLIVE / ENSURE PLUS) liquid 237 mL  237 mL Oral BID BM Sarina Ill, DO   237 mL at 01/16/23 1042   gabapentin (NEURONTIN) capsule 300 mg  300 mg Oral TID Rankin, Shuvon B, NP   300 mg at 01/16/23 1610   haloperidol (HALDOL) tablet 5 mg  5 mg Oral TID PRN Rankin, Shuvon B, NP       Or   haloperidol lactate (HALDOL) injection 5 mg  5 mg Intramuscular TID PRN Rankin, Shuvon B, NP       magnesium hydroxide (MILK OF MAGNESIA) suspension 30 mL  30 mL Oral Daily PRN Rankin, Shuvon B, NP       melatonin tablet 7.5 mg  7.5 mg Oral QHS Parmar, Meenakshi, MD   7.5 mg at 01/15/23 2154   nicotine (NICODERM CQ - dosed in mg/24 hours) patch 14 mg  14 mg Transdermal Q24H Clapacs, John T, MD   14 mg at 01/16/23 9604   oxyCODONE (Oxy IR/ROXICODONE) immediate release tablet 10 mg  10 mg Oral Q6H Clapacs, John T, MD   10 mg at 01/16/23 1024   traZODone (DESYREL) tablet 100 mg  100 mg Oral QHS PRN Sarina Ill, DO   100 mg at 01/15/23 2202   Vitamin D (Ergocalciferol) (DRISDOL) 1.25 MG (50000 UNIT) capsule 50,000 Units  50,000 Units Oral Q  Fri Rankin, Shuvon B, NP   50,000 Units at 01/16/23 5409    Lab Results: No results found for this or any previous visit (from the past 48 hour(s)).  Blood Alcohol level:  Lab Results  Component Value Date   ETH <10 01/07/2023   ETH <10 12/04/2022    Metabolic Disorder Labs: Lab Results  Component Value Date   HGBA1C 5.8 (H) 06/14/2022   MPG 120 06/14/2022   No results found for: "PROLACTIN" No results found for: "CHOL", "TRIG", "HDL", "CHOLHDL", "VLDL", "LDLCALC"  Physical Findings: AIMS:  , ,  ,  ,    CIWA:    COWS:     Musculoskeletal: Strength & Muscle Tone: within normal limits Gait & Station: unable to stand Patient leans: N/A  Psychiatric Specialty Exam:  Presentation  General Appearance:  Appropriate for Environment  Eye Contact: Fair  Speech: Clear and Coherent  Speech Volume: Increased  Handedness:No data recorded  Mood and Affect  Mood: Dysphoric  Affect: Congruent   Thought Process  Thought Processes: Coherent; Goal Directed  Descriptions of Associations:Intact  Orientation:Full (Time, Place and Person)  Thought Content:Logical  History of Schizophrenia/Schizoaffective disorder:No  Duration of Psychotic Symptoms:No data recorded Hallucinations:No data recorded Ideas of Reference:None  Suicidal Thoughts:No data recorded Homicidal Thoughts:No data recorded  Sensorium  Memory: Immediate Fair; Recent Fair; Remote Fair  Judgment: Poor  Insight: Poor   Executive Functions  Concentration: Fair  Attention Span: Fair  Recall: [employer] of Knowledge: Fair  Language: Fair   Psychomotor Activity  Psychomotor Activity:No data recorded  Assets  Assets: Communication Skills; Desire for Improvement   Sleep  Sleep:No data recorded    Blood pressure 123/66, pulse 70, temperature 98.7 F (37.1 C), resp. rate 18, weight 70.8 kg, SpO2 92 %. Body mass index is 20.03 kg/m.   Treatment Plan Summary: Daily  contact with patient to assess and evaluate symptoms and progress in treatment, Medication management, and Plan change trazodone to as needed and add doxepin 50 mg at bedtime.  Antoinett Dorman [place], DO 01/16/2023, 1:26 PM

## 2023-01-16 NOTE — Group Note (Signed)
Date:  01/16/2023 Time:  11:17 AM  Group Topic/Focus:  Goals Group:   The focus of this group is to help patients establish daily goals to achieve during treatment and discuss how the patient can incorporate goal setting into their daily lives to aide in recovery.    Participation Level:  Did Not Attend   Rodena Goldmann 01/16/2023, 11:17 AM

## 2023-01-16 NOTE — Group Note (Signed)
Recreation Therapy Group Note   Group Topic:Leisure Education  Group Date: 01/16/2023 Start Time: 1400 End Time: 1455 Facilitators: Rosina Lowenstein, LRT, CTRS Location:  Dayroom  Group Description: Leisure. Patients were given the option to choose from coloring, singing karaoke, or playing cards. LRT and pts discussed the meaning of leisure, the importance of participating in leisure during their free time/when they're outside of the hospital, as well as how our leisure interests can also serve as coping skills. Pt identified two leisure interests and shared with the group.   Goal Area(s) Addressed:  Patient will identify a current leisure interest.  Patient will learn the definition of "leisure". Patient will practice making a positive decision. Patient will have the opportunity to try a new leisure activity. Patient will communicate with peers and LRT.   Affect/Mood: Appropriate   Participation Level: Moderate   Participation Quality: Independent   Behavior: Calm   Speech/Thought Process: Coherent   Insight: Fair   Judgement: Fair    Modes of Intervention: Activity   Patient Response to Interventions:  Receptive   Education Outcome:  Acknowledges education   Clinical Observations/Individualized Feedback: Angelito was mostly active in their participation of session activities and group discussion. Pt identified "color" as something that he does in his free time. Pt came late to group and was in and out multiple times. Pt chose to color while in group. Pt interacted well with LRT and peers duration of session.   Plan: Continue to engage patient in RT group sessions 2-3x/week.   Rosina Lowenstein, LRT, CTRS 01/16/2023 3:54 PM

## 2023-01-17 DIAGNOSIS — F332 Major depressive disorder, recurrent severe without psychotic features: Secondary | ICD-10-CM | POA: Diagnosis not present

## 2023-01-17 NOTE — Progress Notes (Signed)
   01/16/23 2335  Psych Admission Type (Psych Patients Only)  Admission Status Involuntary  Psychosocial Assessment  Patient Complaints None  Eye Contact Fair  Facial Expression Animated  Affect Appropriate to circumstance  Speech Logical/coherent  Interaction Assertive  Motor Activity Other (Comment) (wheelchair bound)  Appearance/Hygiene Unremarkable  Behavior Characteristics Appropriate to situation  Mood Pleasant  Thought Process  Coherency WDL  Content WDL  Delusions None reported or observed  Perception WDL  Hallucination None reported or observed  Judgment Impaired  Confusion None  Danger to Self  Current suicidal ideation? Denies

## 2023-01-17 NOTE — Plan of Care (Signed)
  Problem: Activity: Goal: Risk for activity intolerance will decrease Outcome: Progressing   Problem: Nutrition: Goal: Adequate nutrition will be maintained Outcome: Progressing   Problem: Coping: Goal: Level of anxiety will decrease Outcome: Progressing   

## 2023-01-17 NOTE — Progress Notes (Signed)
   01/17/23 1610  15 Minute Checks  Location Bedroom  Visual Appearance Calm  Behavior Sleeping  Sleep (Behavioral Health Patients Only)  Calculate sleep? (Click Yes once per 24 hr at 0600 safety check) Yes  Documented sleep last 24 hours 7

## 2023-01-17 NOTE — Progress Notes (Signed)
[facility] MD Progress Note  01/17/2023 3:05 PM Jeffery Gross  MRN:  [id number] Subjective: Seen on rounds.  He does not have any complaints.  He states that he slept a little bit better last night.  Nurses report no issues. Principal Problem: MDD (major depressive disorder), recurrent severe, without psychosis (HCC) Diagnosis: Principal Problem:   MDD (major depressive disorder), recurrent severe, without psychosis (HCC) Active Problems:   S/P AKA (above knee amputation) bilateral (HCC)   Chronic low back pain   Cocaine abuse (HCC)   Opiate dependence (HCC)   Homelessness   Facial cellulitis  Total Time spent with patient: 15 minutes  Past Psychiatric History: Depression  Past Medical History:  Past Medical History:  Diagnosis Date   Back pain    Chest pain    Depression    DVT (deep venous thrombosis) (HCC)    GERD (gastroesophageal reflux disease)    History of kidney stones    passed   HTN (hypertension)    no longer has HTN    Past Surgical History:  Procedure Laterality Date   AMPUTATION Bilateral 01/09/2016   Procedure: Bilateral Above Knee Amputation, Apply Wound VAC;  Surgeon: Nadara Mustard, MD;  Location: [facility] OR;  Service: Orthopedics;  Laterality: Bilateral;   AMPUTATION Right 08/09/2022   Procedure: RIGHT REVISION AMPUTATION ABOVE KNEE;  Surgeon: Nadara Mustard, MD;  Location: [facility];  Service: Orthopedics;  Laterality: Right;   FASCIOTOMY Bilateral 01/03/2016   Procedure: FASCIOTOMY;  Surgeon: Tarry Kos, MD;  Location: [facility] OR;  Service: Orthopedics;  Laterality: Bilateral;   I & D EXTREMITY Bilateral 01/05/2016   Procedure: IRRIGATION AND DEBRIDEMENT BILATERAL LOWER EXTREMITIES; WOUND VAC CHANGE;  Surgeon: Tarry Kos, MD;  Location: [facility] OR;  Service: Orthopedics;  Laterality: Bilateral;   I & D EXTREMITY Left 01/07/2016   Procedure: Irrigation and Debridement of Left Lower Extremity with Application of Wound Vac;  Surgeon: Tarry Kos, MD;  Location: [facility] OR;  Service:  Orthopedics;  Laterality: Left;   STUMP REVISION Right 07/04/2022   Procedure: REVISION RIGHT ABOVE KNEE AMPUTATION;  Surgeon: Nadara Mustard, MD;  Location: [facility] OR;  Service: Orthopedics;  Laterality: Right;   VASECTOMY     Family History:  Family History  Problem Relation Age of Onset   Hypertension Father    Family Psychiatric  History: Unremarkable Social History:  Social History   Substance and Sexual Activity  Alcohol Use Yes   Comment: rare     Social History   Substance and Sexual Activity  Drug Use Yes   Types: Cocaine    Social History   Socioeconomic History   Marital status: Single    Spouse name: Not on file   Number of children: 3   Years of education: Not on file   Highest education level: Not on file  Occupational History   Occupation: disabled  Tobacco Use   Smoking status: Every Day    Packs/day: 0.25    Years: 5.00    Additional pack years: 0.00    Total pack years: 1.25    Types: Cigarettes   Smokeless tobacco: Current  Vaping Use   Vaping Use: Every day   Substances: Nicotine  Substance and Sexual Activity   Alcohol use: Yes    Comment: rare   Drug use: Yes    Types: Cocaine   Sexual activity: Not on file  Other Topics Concern   Not on file  Social History Narrative  Not on file   Social Determinants of Health   Financial Resource Strain: Not on file  Food Insecurity: Food Insecurity Present (01/08/2023)   Hunger Vital Sign    Worried About Running Out of Food in the Last Year: Sometimes true    Ran Out of Food in the Last Year: Sometimes true  Transportation Needs: No Transportation Needs (01/08/2023)   PRAPARE - Administrator, Civil Service (Medical): No    Lack of Transportation (Non-Medical): No  Physical Activity: Not on file  Stress: Not on file  Social Connections: Not on file   Additional Social History:                         Sleep: Good  Appetite:  Good  Current Medications: Current  Facility-Administered Medications  Medication Dose Route Frequency Provider Last Rate Last Admin   acetaminophen (TYLENOL) tablet 650 mg  650 mg Oral Q6H PRN Rankin, Shuvon B, NP   650 mg at 01/10/23 0116   alum & mag hydroxide-simeth (MAALOX/MYLANTA) 200-200-20 MG/5ML suspension 30 mL  30 mL Oral Q4H PRN Rankin, Shuvon B, NP       diphenhydrAMINE (BENADRYL) capsule 50 mg  50 mg Oral TID PRN Rankin, Shuvon B, NP       Or   diphenhydrAMINE (BENADRYL) injection 50 mg  50 mg Intramuscular TID PRN Rankin, Shuvon B, NP       doxepin (SINEQUAN) capsule 50 mg  50 mg Oral QHS Sarina Ill, DO   50 mg at 01/16/23 2206   doxycycline (VIBRA-TABS) tablet 100 mg  100 mg Oral Q12H Clapacs, John T, MD   100 mg at 01/17/23 0905   DULoxetine (CYMBALTA) DR capsule 60 mg  60 mg Oral q AM Rankin, Shuvon B, NP   60 mg at 01/17/23 0905   feeding supplement (ENSURE ENLIVE / ENSURE PLUS) liquid 237 mL  237 mL Oral BID BM Sarina Ill, DO   237 mL at 01/17/23 1352   gabapentin (NEURONTIN) capsule 300 mg  300 mg Oral TID Rankin, Shuvon B, NP   300 mg at 01/17/23 1610   haloperidol (HALDOL) tablet 5 mg  5 mg Oral TID PRN Rankin, Shuvon B, NP       Or   haloperidol lactate (HALDOL) injection 5 mg  5 mg Intramuscular TID PRN Rankin, Shuvon B, NP       magnesium hydroxide (MILK OF MAGNESIA) suspension 30 mL  30 mL Oral Daily PRN Rankin, Shuvon B, NP       melatonin tablet 7.5 mg  7.5 mg Oral QHS Marval Regal, Meenakshi, MD   7.5 mg at 01/16/23 2206   nicotine polacrilex (NICORETTE) gum 2 mg  2 mg Oral PRN Sarina Ill, DO   2 mg at 01/17/23 1208   oxyCODONE (Oxy IR/ROXICODONE) immediate release tablet 10 mg  10 mg Oral Q6H Clapacs, John T, MD   10 mg at 01/17/23 1115   traZODone (DESYREL) tablet 100 mg  100 mg Oral QHS PRN Sarina Ill, DO   100 mg at 01/15/23 2202   Vitamin D (Ergocalciferol) (DRISDOL) 1.25 MG (50000 UNIT) capsule 50,000 Units  50,000 Units Oral Q Fri Rankin, Shuvon B,  NP   50,000 Units at 01/16/23 9604    Lab Results: No results found for this or any previous visit (from the past 48 hour(s)).  Blood Alcohol level:  Lab Results  Component Value Date  ETH <10 01/07/2023   ETH <10 12/04/2022    Metabolic Disorder Labs: Lab Results  Component Value Date   HGBA1C 5.8 (H) 06/14/2022   MPG 120 06/14/2022   No results found for: "PROLACTIN" No results found for: "CHOL", "TRIG", "HDL", "CHOLHDL", "VLDL", "LDLCALC"  Physical Findings: AIMS:  , ,  ,  ,    CIWA:    COWS:     Musculoskeletal: Strength & Muscle Tone: within normal limits Gait & Station: normal Patient leans: N/A  Psychiatric Specialty Exam:  Presentation  General Appearance:  Appropriate for Environment  Eye Contact: Fair  Speech: Clear and Coherent  Speech Volume: Increased  Handedness:No data recorded  Mood and Affect  Mood: Dysphoric  Affect: Congruent   Thought Process  Thought Processes: Coherent; Goal Directed  Descriptions of Associations:Intact  Orientation:Full (Time, Place and Person)  Thought Content:Logical  History of Schizophrenia/Schizoaffective disorder:No  Duration of Psychotic Symptoms:No data recorded Hallucinations:No data recorded Ideas of Reference:None  Suicidal Thoughts:No data recorded Homicidal Thoughts:No data recorded  Sensorium  Memory: Immediate Fair; Recent Fair; Remote Fair  Judgment: Poor  Insight: Poor   Executive Functions  Concentration: Fair  Attention Span: Fair  Recall: [employer] of Knowledge: Fair  Language: Fair   Psychomotor Activity  Psychomotor Activity:No data recorded  Assets  Assets: Communication Skills; Desire for Improvement   Sleep  Sleep:No data recorded    Blood pressure 118/66, pulse 67, temperature 97.9 F (36.6 C), temperature source Oral, resp. rate 18, weight 70.8 kg, SpO2 96 %. Body mass index is 20.03 kg/m.   Treatment Plan Summary: Daily contact  with patient to assess and evaluate symptoms and progress in treatment, Medication management, and Plan continue current medications.  Sarina Ill, DO 01/17/2023, 3:05 PM

## 2023-01-17 NOTE — Group Note (Signed)
Date:  01/17/2023 Time:  12:46 AM  Group Topic/Focus:  Building Self Esteem:   The Focus of this group is helping patients become aware of the effects of self-esteem on their lives, the things they and others do that enhance or undermine their self-esteem, seeing the relationship between their level of self-esteem and the choices they make and learning ways to enhance self-esteem. Coping With Mental Health Crisis:   The purpose of this group is to help patients identify strategies for coping with mental health crisis.  Group discusses possible causes of crisis and ways to manage them effectively. Developing a Wellness Toolbox:   The focus of this group is to help patients develop a "wellness toolbox" with skills and strategies to promote recovery upon discharge. Goals Group:   The focus of this group is to help patients establish daily goals to achieve during treatment and discuss how the patient can incorporate goal setting into their daily lives to aide in recovery. Healthy Communication:   The focus of this group is to discuss communication, barriers to communication, as well as healthy ways to communicate with others. Identifying Needs:   The focus of this group is to help patients identify their personal needs that have been historically problematic and identify healthy behaviors to address their needs. Making Healthy Choices:   The focus of this group is to help patients identify negative/unhealthy choices they were using prior to admission and identify positive/healthier coping strategies to replace them upon discharge. Managing Feelings:   The focus of this group is to identify what feelings patients have difficulty handling and develop a plan to handle them in a healthier way upon discharge. Rediscovering Joy:   The focus of this group is to explore various ways to relieve stress in a positive manner.    Participation Level:  Active  Participation Quality:  Appropriate  Affect:   Appropriate  Cognitive:  Alert and Appropriate  Insight: Appropriate and Improving  Engagement in Group:  Engaged, Improving, and Supportive  Modes of Intervention:  Discussion and Support  Additional Comments:    Jeffery Gross 01/17/2023, 12:46 AM

## 2023-01-17 NOTE — Progress Notes (Signed)
   01/17/23 0900  Psych Admission Type (Psych Patients Only)  Admission Status Involuntary  Psychosocial Assessment  Patient Complaints None  Eye Contact Fair  Facial Expression Animated  Affect Appropriate to circumstance  Speech Logical/coherent;Loud  Interaction Assertive  Motor Activity Other (Comment) (w/c - bilat bka)  Appearance/Hygiene Unremarkable  Behavior Characteristics Appropriate to situation  Mood Labile  Thought Process  Coherency WDL  Content WDL  Delusions None reported or observed  Perception WDL  Hallucination None reported or observed  Judgment Limited  Confusion None  Danger to Self  Current suicidal ideation? Denies  Agreement Not to Harm Self Yes  Description of Agreement verbal  Danger to Others  Danger to Others None reported or observed

## 2023-01-18 DIAGNOSIS — F332 Major depressive disorder, recurrent severe without psychotic features: Secondary | ICD-10-CM | POA: Diagnosis not present

## 2023-01-18 MED ORDER — DOXEPIN HCL 50 MG PO CAPS
100.0000 mg | ORAL_CAPSULE | Freq: Every day | ORAL | Status: DC
  Administered 2023-01-18 – 2023-01-19 (×2): 100 mg via ORAL
  Filled 2023-01-18 (×2): qty 2

## 2023-01-18 NOTE — Progress Notes (Signed)
   01/18/23 0603  15 Minute Checks  Location Hallway  Visual Appearance Calm  Behavior Composed  Sleep (Behavioral Health Patients Only)  Calculate sleep? (Click Yes once per 24 hr at 0600 safety check) Yes  Documented sleep last 24 hours 8.75

## 2023-01-18 NOTE — Progress Notes (Signed)
[facility] MD Progress Note  01/18/2023 2:43 PM LAURIE DEALMEIDA  MRN:  [id number] Subjective: Not seen on rounds.  He has no complaints.  He has been in good control.  Nurses report no issues. Principal Problem: MDD (major depressive disorder), recurrent severe, without psychosis (HCC) Diagnosis: Principal Problem:   MDD (major depressive disorder), recurrent severe, without psychosis (HCC) Active Problems:   S/P AKA (above knee amputation) bilateral (HCC)   Chronic low back pain   Cocaine abuse (HCC)   Opiate dependence (HCC)   Homelessness   Facial cellulitis  Total Time spent with patient: 15 minutes  Past Psychiatric History: Depression  Past Medical History:  Past Medical History:  Diagnosis Date   Back pain    Chest pain    Depression    DVT (deep venous thrombosis) (HCC)    GERD (gastroesophageal reflux disease)    History of kidney stones    passed   HTN (hypertension)    no longer has HTN    Past Surgical History:  Procedure Laterality Date   AMPUTATION Bilateral 01/09/2016   Procedure: Bilateral Above Knee Amputation, Apply Wound VAC;  Surgeon: Nadara Mustard, MD;  Location: [facility] OR;  Service: Orthopedics;  Laterality: Bilateral;   AMPUTATION Right 08/09/2022   Procedure: RIGHT REVISION AMPUTATION ABOVE KNEE;  Surgeon: Nadara Mustard, MD;  Location: [facility] OR;  Service: Orthopedics;  Laterality: Right;   FASCIOTOMY Bilateral 01/03/2016   Procedure: FASCIOTOMY;  Surgeon: Tarry Kos, MD;  Location: [facility] OR;  Service: Orthopedics;  Laterality: Bilateral;   I & D EXTREMITY Bilateral 01/05/2016   Procedure: IRRIGATION AND DEBRIDEMENT BILATERAL LOWER EXTREMITIES; WOUND VAC CHANGE;  Surgeon: Tarry Kos, MD;  Location: [facility] OR;  Service: Orthopedics;  Laterality: Bilateral;   I & D EXTREMITY Left 01/07/2016   Procedure: Irrigation and Debridement of Left Lower Extremity with Application of Wound Vac;  Surgeon: Tarry Kos, MD;  Location: [facility] OR;  Service: Orthopedics;  Laterality: Left;    STUMP REVISION Right 07/04/2022   Procedure: REVISION RIGHT ABOVE KNEE AMPUTATION;  Surgeon: Nadara Mustard, MD;  Location: [facility] OR;  Service: Orthopedics;  Laterality: Right;   VASECTOMY     Family History:  Family History  Problem Relation Age of Onset   Hypertension Father    Family Psychiatric  History: Unremarkable Social History:  Social History   Substance and Sexual Activity  Alcohol Use Yes   Comment: rare     Social History   Substance and Sexual Activity  Drug Use Yes   Types: Cocaine    Social History   Socioeconomic History   Marital status: Single    Spouse name: Not on file   Number of children: 3   Years of education: Not on file   Highest education level: Not on file  Occupational History   Occupation: disabled  Tobacco Use   Smoking status: Every Day    Packs/day: 0.25    Years: 5.00    Additional pack years: 0.00    Total pack years: 1.25    Types: Cigarettes   Smokeless tobacco: Current  Vaping Use   Vaping Use: Every day   Substances: Nicotine  Substance and Sexual Activity   Alcohol use: Yes    Comment: rare   Drug use: Yes    Types: Cocaine   Sexual activity: Not on file  Other Topics Concern   Not on file  Social History Narrative   Not on file   Social  Determinants of Health   Financial Resource Strain: Not on file  Food Insecurity: Food Insecurity Present (01/08/2023)   Hunger Vital Sign    Worried About Running Out of Food in the Last Year: Sometimes true    Ran Out of Food in the Last Year: Sometimes true  Transportation Needs: No Transportation Needs (01/08/2023)   PRAPARE - Administrator, Civil Service (Medical): No    Lack of Transportation (Non-Medical): No  Physical Activity: Not on file  Stress: Not on file  Social Connections: Not on file   Additional Social History:                         Sleep: Good  Appetite:  Good  Current Medications: Current Facility-Administered Medications   Medication Dose Route Frequency Provider Last Rate Last Admin   acetaminophen (TYLENOL) tablet 650 mg  650 mg Oral Q6H PRN Rankin, Shuvon B, NP   650 mg at 01/10/23 0116   alum & mag hydroxide-simeth (MAALOX/MYLANTA) 200-200-20 MG/5ML suspension 30 mL  30 mL Oral Q4H PRN Rankin, Shuvon B, NP       diphenhydrAMINE (BENADRYL) capsule 50 mg  50 mg Oral TID PRN Rankin, Shuvon B, NP       Or   diphenhydrAMINE (BENADRYL) injection 50 mg  50 mg Intramuscular TID PRN Rankin, Shuvon B, NP       doxepin (SINEQUAN) capsule 50 mg  50 mg Oral QHS Sarina Ill, DO   50 mg at 01/17/23 2205   doxycycline (VIBRA-TABS) tablet 100 mg  100 mg Oral Q12H Clapacs, John T, MD   100 mg at 01/18/23 1059   DULoxetine (CYMBALTA) DR capsule 60 mg  60 mg Oral q AM Rankin, Shuvon B, NP   60 mg at 01/18/23 0756   feeding supplement (ENSURE ENLIVE / ENSURE PLUS) liquid 237 mL  237 mL Oral BID BM Sarina Ill, DO   237 mL at 01/18/23 1059   gabapentin (NEURONTIN) capsule 300 mg  300 mg Oral TID Rankin, Shuvon B, NP   300 mg at 01/18/23 1058   haloperidol (HALDOL) tablet 5 mg  5 mg Oral TID PRN Rankin, Shuvon B, NP       Or   haloperidol lactate (HALDOL) injection 5 mg  5 mg Intramuscular TID PRN Rankin, Shuvon B, NP       magnesium hydroxide (MILK OF MAGNESIA) suspension 30 mL  30 mL Oral Daily PRN Rankin, Shuvon B, NP       melatonin tablet 7.5 mg  7.5 mg Oral QHS Lewanda Rife, MD   7.5 mg at 01/17/23 2205   nicotine polacrilex (NICORETTE) gum 2 mg  2 mg Oral PRN Sarina Ill, DO   2 mg at 01/18/23 1243   oxyCODONE (Oxy IR/ROXICODONE) immediate release tablet 10 mg  10 mg Oral Q6H Clapacs, John T, MD   10 mg at 01/18/23 1059   traZODone (DESYREL) tablet 100 mg  100 mg Oral QHS PRN Sarina Ill, DO   100 mg at 01/15/23 2202   Vitamin D (Ergocalciferol) (DRISDOL) 1.25 MG (50000 UNIT) capsule 50,000 Units  50,000 Units Oral Q Fri Rankin, Shuvon B, NP   50,000 Units at 01/16/23 9528     Lab Results: No results found for this or any previous visit (from the past 48 hour(s)).  Blood Alcohol level:  Lab Results  Component Value Date   ETH <10 01/07/2023   ETH <  10 12/04/2022    Metabolic Disorder Labs: Lab Results  Component Value Date   HGBA1C 5.8 (H) 06/14/2022   MPG 120 06/14/2022   No results found for: "PROLACTIN" No results found for: "CHOL", "TRIG", "HDL", "CHOLHDL", "VLDL", "LDLCALC"  Physical Findings: AIMS:  , ,  ,  ,    CIWA:    COWS:     Musculoskeletal: Strength & Muscle Tone: within normal limits Gait & Station: normal, unable to stand Patient leans: N/A  Psychiatric Specialty Exam:  Presentation  General Appearance:  Appropriate for Environment  Eye Contact: Fair  Speech: Clear and Coherent  Speech Volume: Increased  Handedness:No data recorded  Mood and Affect  Mood: Dysphoric  Affect: Congruent   Thought Process  Thought Processes: Coherent; Goal Directed  Descriptions of Associations:Intact  Orientation:Full (Time, Place and Person)  Thought Content:Logical  History of Schizophrenia/Schizoaffective disorder:No  Duration of Psychotic Symptoms:No data recorded Hallucinations:No data recorded Ideas of Reference:None  Suicidal Thoughts:No data recorded Homicidal Thoughts:No data recorded  Sensorium  Memory: Immediate Fair; Recent Fair; Remote Fair  Judgment: Poor  Insight: Poor   Executive Functions  Concentration: Fair  Attention Span: Fair  Recall: [employer] of Knowledge: Fair  Language: Fair   Psychomotor Activity  Psychomotor Activity:No data recorded  Assets  Assets: Communication Skills; Desire for Improvement   Sleep  Sleep:No data recorded    Blood pressure 114/66, pulse 73, temperature 98.1 F (36.7 C), temperature source Oral, resp. rate 16, weight 70.8 kg, SpO2 96 %. Body mass index is 20.03 kg/m.   Treatment Plan Summary: Daily contact with patient to  assess and evaluate symptoms and progress in treatment, Medication management, and Plan continue current medications.  Sarina Ill, DO 01/18/2023, 2:43 PM

## 2023-01-18 NOTE — Plan of Care (Signed)
  Problem: Clinical Measurements: Goal: Ability to maintain clinical measurements within normal limits will improve Outcome: Progressing Goal: Will remain free from infection Outcome: Progressing   Problem: Pain Managment: Goal: General experience of comfort will improve Outcome: Progressing   Problem: Safety: Goal: Ability to remain free from injury will improve 01/18/2023 1524 by Idelle Leech, RN Outcome: Progressing 01/18/2023 1357 by Idelle Leech, RN Outcome: Progressing

## 2023-01-18 NOTE — Group Note (Signed)
LCSW Group Therapy Note   Group Date: 01/18/2023 Start Time: 1310 End Time: 1357   Type of Therapy and Topic:  Group Therapy: 7 ways to Reduce Stress  Participation Level:  Active      Summary of Patient Progress:  The patient attended group. The patient stated that coloring is a way that he reduces stress. Stating that it takes his mind off things. The patient stated that making people laugh is another way he reduces stress.    Marshell Levan, LCSWA 01/18/2023  2:16 PM

## 2023-01-18 NOTE — Plan of Care (Signed)
  Problem: Clinical Measurements: Goal: Ability to maintain clinical measurements within normal limits will improve Outcome: Progressing Goal: Will remain free from infection Outcome: Progressing   Problem: Safety: Goal: Ability to remain free from injury will improve Outcome: Progressing   

## 2023-01-18 NOTE — Progress Notes (Signed)
   01/18/23 0000  Psych Admission Type (Psych Patients Only)  Admission Status Involuntary  Psychosocial Assessment  Patient Complaints None  Eye Contact Fair  Facial Expression Animated  Affect Appropriate to circumstance  Speech Logical/coherent  Interaction Assertive  Motor Activity Slow  Appearance/Hygiene Unremarkable  Behavior Characteristics Appropriate to situation  Mood Pleasant  Thought Process  Coherency WDL  Content WDL  Delusions None reported or observed  Perception WDL  Hallucination None reported or observed  Judgment Limited  Confusion None  Danger to Self  Current suicidal ideation? Denies

## 2023-01-18 NOTE — Progress Notes (Signed)
   01/18/23 1040  Psych Admission Type (Psych Patients Only)  Admission Status Involuntary  Psychosocial Assessment  Patient Complaints None  Eye Contact Fair  Facial Expression Animated  Affect Appropriate to circumstance  Speech Logical/coherent  Interaction Assertive  Motor Activity Slow  Appearance/Hygiene Unremarkable  Behavior Characteristics Appropriate to situation  Mood Pleasant  Thought Process  Coherency WDL  Content WDL  Delusions None reported or observed  Perception WDL  Hallucination None reported or observed  Judgment Limited  Confusion None  Danger to Self  Current suicidal ideation? Denies  Agreement Not to Harm Self Yes  Description of Agreement Verbal  Danger to Others  Danger to Others None reported or observed   Patient became argumentative with staff.  Earlier in this shift, patient was demanding to his peers.  He stated to the others that a certain peer wanted to listen to gospel music and that was what they were going to do.  Later patient made controlling statements about another peer.  A staff member redirected him and he became angry at the staff member.  He made angry statements towards the staff member.  The incident was deescalated.  The charge nurse allowed the patient to vent.   Patient continues on Q 15 minute checks.

## 2023-01-19 NOTE — Progress Notes (Signed)
Patient complaint with treatment plan interacting well with Peers and Staff verbally contracted for safety. Patient has been preoccupied about discharge tomorrow Patient stated that he wish he could stay a few more days since he does not have anywhere to go and is not willing to go to a shelter or other places that are in a bad neighborhood. Support and encouragement provided.

## 2023-01-19 NOTE — Group Note (Signed)
Date:  01/19/2023 Time:  3:40 AM  Group Topic/Focus:  Emotional Education:   The focus of this group is to discuss what feelings/emotions are, and how they are experienced.  The focus of this group is to learn each patients love language which leads to building healthier relationships with their support systems.  Participation Level:  Minimal  Participation Quality:  Appropriate  Affect:  Appropriate  Cognitive:  Alert  Insight: Good  Engagement in Group:  None  Modes of Intervention:  Activity and Education  Additional Comments:  [name] did attend group. The patient did not participate in the activity.  [name] 01/19/2023, 3:40 AM

## 2023-01-19 NOTE — BH IP Treatment Plan (Signed)
Interdisciplinary Treatment and Diagnostic Plan Update  01/19/2023 Time of Session: 9:45AM Jeffery Gross MRN: [id number]  Principal Diagnosis: MDD (major depressive disorder), recurrent severe, without psychosis (HCC)  Secondary Diagnoses: Principal Problem:   MDD (major depressive disorder), recurrent severe, without psychosis (HCC) Active Problems:   S/P AKA (above knee amputation) bilateral (HCC)   Chronic low back pain   Cocaine abuse (HCC)   Opiate dependence (HCC)   Homelessness   Facial cellulitis   Current Medications:  Current Facility-Administered Medications  Medication Dose Route Frequency Provider Last Rate Last Admin   acetaminophen (TYLENOL) tablet 650 mg  650 mg Oral Q6H PRN Rankin, Shuvon B, NP   650 mg at 01/10/23 0116   alum & mag hydroxide-simeth (MAALOX/MYLANTA) 200-200-20 MG/5ML suspension 30 mL  30 mL Oral Q4H PRN Rankin, Shuvon B, NP       diphenhydrAMINE (BENADRYL) capsule 50 mg  50 mg Oral TID PRN Rankin, Shuvon B, NP       Or   diphenhydrAMINE (BENADRYL) injection 50 mg  50 mg Intramuscular TID PRN Rankin, Shuvon B, NP       doxepin (SINEQUAN) capsule 100 mg  100 mg Oral QHS Sarina Ill, DO   100 mg at 01/18/23 2244   doxycycline (VIBRA-TABS) tablet 100 mg  100 mg Oral Q12H Clapacs, John T, MD   100 mg at 01/19/23 1200   DULoxetine (CYMBALTA) DR capsule 60 mg  60 mg Oral q AM Rankin, Shuvon B, NP   60 mg at 01/19/23 0601   feeding supplement (ENSURE ENLIVE / ENSURE PLUS) liquid 237 mL  237 mL Oral BID BM Sarina Ill, DO   237 mL at 01/18/23 1500   gabapentin (NEURONTIN) capsule 300 mg  300 mg Oral TID Rankin, Shuvon B, NP   300 mg at 01/19/23 1200   haloperidol (HALDOL) tablet 5 mg  5 mg Oral TID PRN Rankin, Shuvon B, NP       Or   haloperidol lactate (HALDOL) injection 5 mg  5 mg Intramuscular TID PRN Rankin, Shuvon B, NP       magnesium hydroxide (MILK OF MAGNESIA) suspension 30 mL  30 mL Oral Daily PRN Rankin, Shuvon B, NP        melatonin tablet 7.5 mg  7.5 mg Oral QHS Marval Regal, Meenakshi, MD   7.5 mg at 01/18/23 2244   nicotine polacrilex (NICORETTE) gum 2 mg  2 mg Oral PRN Sarina Ill, DO   2 mg at 01/18/23 1711   oxyCODONE (Oxy IR/ROXICODONE) immediate release tablet 10 mg  10 mg Oral Q6H Clapacs, John T, MD   10 mg at 01/19/23 1200   traZODone (DESYREL) tablet 100 mg  100 mg Oral QHS PRN Sarina Ill, DO   100 mg at 01/18/23 2245   Vitamin D (Ergocalciferol) (DRISDOL) 1.25 MG (50000 UNIT) capsule 50,000 Units  50,000 Units Oral Q Fri Rankin, Shuvon B, NP   50,000 Units at 01/16/23 0906   PTA Medications: Medications Prior to Admission  Medication Sig Dispense Refill Last Dose   doxycycline (VIBRA-TABS) 100 MG tablet Take 1 tablet (100 mg total) by mouth 2 (two) times daily. 28 tablet 0    DULoxetine (CYMBALTA) 60 MG capsule Take 60 mg by mouth in the morning.      gabapentin (NEURONTIN) 300 MG capsule Take 300 mg by mouth 3 (three) times daily.      omeprazole (PRILOSEC) 20 MG capsule Take 20 mg by mouth in the  morning.       Patient Stressors: Financial difficulties   Health problems    Patient Strengths: Capable of independent living  Communication skills   Treatment Modalities: Medication Management, Group therapy, Case management,  1 to 1 session with clinician, Psychoeducation, Recreational therapy.   Physician Treatment Plan for Primary Diagnosis: MDD (major depressive disorder), recurrent severe, without psychosis (HCC) Long Term Goal(s): Improvement in symptoms so as ready for discharge   Short Term Goals: Ability to maintain clinical measurements within normal limits will improve Ability to disclose and discuss suicidal ideas Ability to demonstrate self-control will improve  Medication Management: Evaluate patient's response, side effects, and tolerance of medication regimen.  Therapeutic Interventions: 1 to 1 sessions, Unit Group sessions and Medication  administration.  Evaluation of Outcomes: Progressing  Physician Treatment Plan for Secondary Diagnosis: Principal Problem:   MDD (major depressive disorder), recurrent severe, without psychosis (HCC) Active Problems:   S/P AKA (above knee amputation) bilateral (HCC)   Chronic low back pain   Cocaine abuse (HCC)   Opiate dependence (HCC)   Homelessness   Facial cellulitis  Long Term Goal(s): Improvement in symptoms so as ready for discharge   Short Term Goals: Ability to maintain clinical measurements within normal limits will improve Ability to disclose and discuss suicidal ideas Ability to demonstrate self-control will improve     Medication Management: Evaluate patient's response, side effects, and tolerance of medication regimen.  Therapeutic Interventions: 1 to 1 sessions, Unit Group sessions and Medication administration.  Evaluation of Outcomes: Progressing   RN Treatment Plan for Primary Diagnosis: MDD (major depressive disorder), recurrent severe, without psychosis (HCC) Long Term Goal(s): Knowledge of disease and therapeutic regimen to maintain health will improve  Short Term Goals: Ability to demonstrate self-control, Ability to participate in decision making will improve, Ability to verbalize feelings will improve, Ability to disclose and discuss suicidal ideas, Ability to identify and develop effective coping behaviors will improve, and Compliance with prescribed medications will improve  Medication Management: RN will administer medications as ordered by provider, will assess and evaluate patient's response and provide education to patient for prescribed medication. RN will report any adverse and/or side effects to prescribing provider.  Therapeutic Interventions: 1 on 1 counseling sessions, Psychoeducation, Medication administration, Evaluate responses to treatment, Monitor vital signs and CBGs as ordered, Perform/monitor CIWA, COWS, AIMS and Fall Risk screenings as  ordered, Perform wound care treatments as ordered.  Evaluation of Outcomes: Progressing   LCSW Treatment Plan for Primary Diagnosis: MDD (major depressive disorder), recurrent severe, without psychosis (HCC) Long Term Goal(s): Safe transition to appropriate next level of care at discharge, Engage patient in therapeutic group addressing interpersonal concerns.  Short Term Goals: Engage patient in aftercare planning with referrals and resources, Increase social support, Increase ability to appropriately verbalize feelings, Increase emotional regulation, Facilitate acceptance of mental health diagnosis and concerns, Facilitate patient progression through stages of change regarding substance use diagnoses and concerns, Identify triggers associated with mental health/substance abuse issues, and Increase skills for wellness and recovery  Therapeutic Interventions: Assess for all discharge needs, 1 to 1 time with Social worker, Explore available resources and support systems, Assess for adequacy in community support network, Educate family and significant other(s) on suicide prevention, Complete Psychosocial Assessment, Interpersonal group therapy.  Evaluation of Outcomes: Progressing   Progress in Treatment: Attending groups: Yes. Participating in groups: Yes. Taking medication as prescribed: Yes. Toleration medication: Yes. Family/Significant other contact made: Yes, individual(s) contacted:  No, once permission has been given.  Patient understands diagnosis: Yes. Discussing patient identified problems/goals with staff: Yes. Medical problems stabilized or resolved: Yes. Denies suicidal/homicidal ideation: Yes. Issues/concerns per patient self-inventory: No. Other: none  New problem(s) identified: No, Describe:  none Update 01/14/2023: No changes at this time. Update 01/19/2023:  No changes at this time.   New Short Term/Long Term Goal(s):  detox, elimination of symptoms of psychosis, medication  management for mood stabilization; elimination of SI thoughts; development of comprehensive mental wellness/sobriety plan. Update 01/14/2023: No changes at this time.  Update 01/19/2023:  No changes at this time.   Patient Goals:  "Hoping I wouldn't be suicidal and hurting so bad"  Update 01/14/2023: No changes at this time. Update 01/19/2023:  No changes at this time.   Discharge Plan or Barriers: CSW will assist with appropriate discharge planning.  Update 01/14/2023: Patient continues to report that he would like to be discharged to an [facility], Assisted Living or shelter. Update 01/19/2023:  Patient is declining to be discharged to a shelter at this time, stating that he would rather live on the street.  CSW has provided patient with the contact information for a wheelchair accessible [facility], unsure at this time if the patient has contacted.     Reason for Continuation of Hospitalization: Depression Medication stabilization Suicidal ideation   Estimated Length of Stay: 1 to 7 days Update 01/19/2023:  TBD  Last 3 [place] Suicide Severity Risk Score: Flowsheet Row Admission (Current) from 01/08/2023 in [facility] BE[facility] ED from 01/07/2023 in [facility] Emergency Department at [facility] ED from 12/04/2022 in [facility] Emergency Department at [facility]  C-SSRS RISK CATEGORY No Risk Moderate Risk High Risk       Last [facility] 2/9 Scores:    02/15/2016    1:54 PM 02/08/2016   11:08 AM  Depression screen PHQ 2/9  Decreased Interest 0 0  Down, Depressed, Hopeless 0 0  PHQ - 2 Score 0 0  Altered sleeping 1   Tired, decreased energy 0   Change in appetite 0   Feeling bad or failure about yourself  0   Trouble concentrating 0   Moving slowly or fidgety/restless 0   Suicidal thoughts 0   PHQ-9 Score 1   Difficult doing work/chores Not difficult at all     Scribe for Treatment Team: Harden Mo, LCSW 01/19/2023 12:05 PM

## 2023-01-19 NOTE — Progress Notes (Signed)
Patient in dayroom most of this evening Patient is intrusive into every other Patients business. Compliant with medications denies SI/HI/A/VH and verbally contracts for safety. Support and encouragement provided.

## 2023-01-19 NOTE — Group Note (Signed)
Recreation Therapy Group Note   Group Topic:General Recreation  Group Date: 01/19/2023 Start Time: 1410 End Time: 1450 Facilitators: Rosina Lowenstein, LRT, CTRS Location: Courtyard  Group Description: Outdoor Recreation. Patients had the option to play ring toss, play with a deck of cards or sit and listen to music while outside in the courtyard getting fresh air and sunlight. LRT and pts discussed things that they enjoy doing in their free time outside of the hospital.  Goal Area(s) Addressed: Patient will identify leisure interests.  Patient will practice healthy decision making. Patient will engage in recreation activity.    Affect/Mood: N/A   Participation Level: Did not attend    Clinical Observations/Individualized Feedback: Jeffery Gross did not attend group.  Plan: Continue to engage patient in RT group sessions 2-3x/week.   Rosina Lowenstein, LRT, CTRS 01/19/2023 3:36 PM

## 2023-01-19 NOTE — Plan of Care (Signed)

## 2023-01-19 NOTE — Progress Notes (Signed)
[facility] MD Progress Note  01/19/2023 1:58 PM Jeffery Gross  MRN:  [id number] Subjective: Jebb is seen on rounds.  He has been compliant with medications and states he is feeling better.  Talked about discharge for tomorrow.  He has been pleasant and cooperative on the unit and participating in groups.  He denies any suicidal ideation. Principal Problem: MDD (major depressive disorder), recurrent severe, without psychosis (HCC) Diagnosis: Principal Problem:   MDD (major depressive disorder), recurrent severe, without psychosis (HCC) Active Problems:   S/P AKA (above knee amputation) bilateral (HCC)   Chronic low back pain   Cocaine abuse (HCC)   Opiate dependence (HCC)   Homelessness   Facial cellulitis  Total Time spent with patient: 15 minutes  Past Psychiatric History: Depression  Past Medical History:  Past Medical History:  Diagnosis Date   Back pain    Chest pain    Depression    DVT (deep venous thrombosis) (HCC)    GERD (gastroesophageal reflux disease)    History of kidney stones    passed   HTN (hypertension)    no longer has HTN    Past Surgical History:  Procedure Laterality Date   AMPUTATION Bilateral 01/09/2016   Procedure: Bilateral Above Knee Amputation, Apply Wound VAC;  Surgeon: Nadara Mustard, MD;  Location: [facility] OR;  Service: Orthopedics;  Laterality: Bilateral;   AMPUTATION Right 08/09/2022   Procedure: RIGHT REVISION AMPUTATION ABOVE KNEE;  Surgeon: Nadara Mustard, MD;  Location: [facility] OR;  Service: Orthopedics;  Laterality: Right;   FASCIOTOMY Bilateral 01/03/2016   Procedure: FASCIOTOMY;  Surgeon: Tarry Kos, MD;  Location: [facility] OR;  Service: Orthopedics;  Laterality: Bilateral;   I & D EXTREMITY Bilateral 01/05/2016   Procedure: IRRIGATION AND DEBRIDEMENT BILATERAL LOWER EXTREMITIES; WOUND VAC CHANGE;  Surgeon: Tarry Kos, MD;  Location: [facility] OR;  Service: Orthopedics;  Laterality: Bilateral;   I & D EXTREMITY Left 01/07/2016   Procedure: Irrigation and  Debridement of Left Lower Extremity with Application of Wound Vac;  Surgeon: Tarry Kos, MD;  Location: [facility] OR;  Service: Orthopedics;  Laterality: Left;   STUMP REVISION Right 07/04/2022   Procedure: REVISION RIGHT ABOVE KNEE AMPUTATION;  Surgeon: Nadara Mustard, MD;  Location: [facility] OR;  Service: Orthopedics;  Laterality: Right;   VASECTOMY     Family History:  Family History  Problem Relation Age of Onset   Hypertension Father    Family Psychiatric  History: Unremarkable Social History:  Social History   Substance and Sexual Activity  Alcohol Use Yes   Comment: rare     Social History   Substance and Sexual Activity  Drug Use Yes   Types: Cocaine    Social History   Socioeconomic History   Marital status: Single    Spouse name: Not on file   Number of children: 3   Years of education: Not on file   Highest education level: Not on file  Occupational History   Occupation: disabled  Tobacco Use   Smoking status: Every Day    Packs/day: 0.25    Years: 5.00    Additional pack years: 0.00    Total pack years: 1.25    Types: Cigarettes   Smokeless tobacco: Current  Vaping Use   Vaping Use: Every day   Substances: Nicotine  Substance and Sexual Activity   Alcohol use: Yes    Comment: rare   Drug use: Yes    Types: Cocaine   Sexual activity: Not  on file  Other Topics Concern   Not on file  Social History Narrative   Not on file   Social Determinants of Health   Financial Resource Strain: Not on file  Food Insecurity: Food Insecurity Present (01/08/2023)   Hunger Vital Sign    Worried About Running Out of Food in the Last Year: Sometimes true    Ran Out of Food in the Last Year: Sometimes true  Transportation Needs: No Transportation Needs (01/08/2023)   PRAPARE - Administrator, Civil Service (Medical): No    Lack of Transportation (Non-Medical): No  Physical Activity: Not on file  Stress: Not on file  Social Connections: Not on file    Additional Social History:                         Sleep: Good  Appetite:  Good  Current Medications: Current Facility-Administered Medications  Medication Dose Route Frequency Provider Last Rate Last Admin   acetaminophen (TYLENOL) tablet 650 mg  650 mg Oral Q6H PRN Rankin, Shuvon B, NP   650 mg at 01/10/23 0116   alum & mag hydroxide-simeth (MAALOX/MYLANTA) 200-200-20 MG/5ML suspension 30 mL  30 mL Oral Q4H PRN Rankin, Shuvon B, NP       diphenhydrAMINE (BENADRYL) capsule 50 mg  50 mg Oral TID PRN Rankin, Shuvon B, NP       Or   diphenhydrAMINE (BENADRYL) injection 50 mg  50 mg Intramuscular TID PRN Rankin, Shuvon B, NP       doxepin (SINEQUAN) capsule 100 mg  100 mg Oral QHS Sarina Ill, DO   100 mg at 01/18/23 2244   doxycycline (VIBRA-TABS) tablet 100 mg  100 mg Oral Q12H Clapacs, John T, MD   100 mg at 01/19/23 1200   DULoxetine (CYMBALTA) DR capsule 60 mg  60 mg Oral q AM Rankin, Shuvon B, NP   60 mg at 01/19/23 0601   feeding supplement (ENSURE ENLIVE / ENSURE PLUS) liquid 237 mL  237 mL Oral BID BM Sarina Ill, DO   237 mL at 01/18/23 1500   gabapentin (NEURONTIN) capsule 300 mg  300 mg Oral TID Rankin, Shuvon B, NP   300 mg at 01/19/23 1200   haloperidol (HALDOL) tablet 5 mg  5 mg Oral TID PRN Rankin, Shuvon B, NP       Or   haloperidol lactate (HALDOL) injection 5 mg  5 mg Intramuscular TID PRN Rankin, Shuvon B, NP       magnesium hydroxide (MILK OF MAGNESIA) suspension 30 mL  30 mL Oral Daily PRN Rankin, Shuvon B, NP       melatonin tablet 7.5 mg  7.5 mg Oral QHS Marval Regal, Meenakshi, MD   7.5 mg at 01/18/23 2244   nicotine polacrilex (NICORETTE) gum 2 mg  2 mg Oral PRN Sarina Ill, DO   2 mg at 01/18/23 1711   oxyCODONE (Oxy IR/ROXICODONE) immediate release tablet 10 mg  10 mg Oral Q6H Clapacs, John T, MD   10 mg at 01/19/23 1200   traZODone (DESYREL) tablet 100 mg  100 mg Oral QHS PRN Sarina Ill, DO   100 mg at  01/18/23 2245   Vitamin D (Ergocalciferol) (DRISDOL) 1.25 MG (50000 UNIT) capsule 50,000 Units  50,000 Units Oral Q Fri Rankin, Shuvon B, NP   50,000 Units at 01/16/23 1610    Lab Results: No results found for this or any previous visit (from  the past 48 hour(s)).  Blood Alcohol level:  Lab Results  Component Value Date   ETH <10 01/07/2023   ETH <10 12/04/2022    Metabolic Disorder Labs: Lab Results  Component Value Date   HGBA1C 5.8 (H) 06/14/2022   MPG 120 06/14/2022   No results found for: "PROLACTIN" No results found for: "CHOL", "TRIG", "HDL", "CHOLHDL", "VLDL", "LDLCALC"  Physical Findings: AIMS:  , ,  ,  ,    CIWA:    COWS:     Musculoskeletal: Strength & Muscle Tone: within normal limits Gait & Station: normal Patient leans: N/A  Psychiatric Specialty Exam:  Presentation  General Appearance:  Appropriate for Environment  Eye Contact: Fair  Speech: Clear and Coherent  Speech Volume: Increased  Handedness:No data recorded  Mood and Affect  Mood: Dysphoric  Affect: Congruent   Thought Process  Thought Processes: Coherent; Goal Directed  Descriptions of Associations:Intact  Orientation:Full (Time, Place and Person)  Thought Content:Logical  History of Schizophrenia/Schizoaffective disorder:No  Duration of Psychotic Symptoms:No data recorded Hallucinations:No data recorded Ideas of Reference:None  Suicidal Thoughts:No data recorded Homicidal Thoughts:No data recorded  Sensorium  Memory: Immediate Fair; Recent Fair; Remote Fair  Judgment: Poor  Insight: Poor   Executive Functions  Concentration: Fair  Attention Span: Fair  Recall: [employer] of Knowledge: Fair  Language: Fair   Psychomotor Activity  Psychomotor Activity:No data recorded  Assets  Assets: Communication Skills; Desire for Improvement   Sleep  Sleep:No data recorded   Physical Exam: Physical Exam Vitals and nursing note reviewed.   Constitutional:      Appearance: Normal appearance. He is normal weight.  Neurological:     General: No focal deficit present.     Mental Status: He is alert and oriented to person, place, and time.  Psychiatric:        Attention and Perception: Attention and perception normal.        Mood and Affect: Mood normal.        Speech: Speech normal.        Behavior: Behavior normal. Behavior is cooperative.        Thought Content: Thought content normal.        Cognition and Memory: Cognition and memory normal.        Judgment: Judgment normal.    Review of Systems  Constitutional: Negative.   HENT: Negative.    Eyes: Negative.   Respiratory: Negative.    Cardiovascular: Negative.   Gastrointestinal: Negative.   Genitourinary: Negative.   Musculoskeletal: Negative.   Skin: Negative.   Neurological: Negative.   Endo/Heme/Allergies: Negative.   Psychiatric/Behavioral: Negative.     Blood pressure 114/69, pulse 83, temperature 98.1 F (36.7 C), resp. rate 15, weight 74.8 kg, SpO2 93 %. Body mass index is 21.18 kg/m.   Treatment Plan Summary: Daily contact with patient to assess and evaluate symptoms and progress in treatment, Medication management, and Plan continue current medications.  Christphor Groft Tresea Mall, DO 01/19/2023, 1:58 PM

## 2023-01-19 NOTE — Progress Notes (Signed)
Patient present in the dayroom for breakfast.  Patient is liable and can be demanding of staff.  Denies SI, HI and AVH.  Compliant with scheduled medications.  15 min checks in place for safety.  Present in the milieu.   Plan is for discharge tomorrow.

## 2023-01-19 NOTE — Group Note (Signed)
[facility] LCSW Group Therapy Note    Group Date: 01/19/2023 Start Time: 1300 End Time: 1350  Type of Therapy and Topic:  Group Therapy:  Overcoming Obstacles  Participation Level:  BHH PARTICIPATION LEVEL: Did Not Attend  Mood:  Description of Group:   In this group patients will be encouraged to explore what they see as obstacles to their own wellness and recovery. They will be guided to discuss their thoughts, feelings, and behaviors related to these obstacles. The group will process together ways to cope with barriers, with attention given to specific choices patients can make. Each patient will be challenged to identify changes they are motivated to make in order to overcome their obstacles. This group will be process-oriented, with patients participating in exploration of their own experiences as well as giving and receiving support and challenge from other group members.  Therapeutic Goals: 1. Patient will identify personal and current obstacles as they relate to admission. 2. Patient will identify barriers that currently interfere with their wellness or overcoming obstacles.  3. Patient will identify feelings, thought process and behaviors related to these barriers. 4. Patient will identify two changes they are willing to make to overcome these obstacles:    Summary of Patient Progress Patient declined to attend treatment team.   Therapeutic Modalities:   Cognitive Behavioral Therapy Solution Focused Therapy Motivational Interviewing Relapse Prevention Therapy   Harden Mo, LCSW

## 2023-01-19 NOTE — BHH Counselor (Signed)
CSW spoke with the patient on housing options.    CSW followed up on patient calling [facility] in [place]. Pt stated that he did not wish to go to [facility] so has not called the [employer].  Pt reports that he is familiar with [facility] and it is not a good place to be.  CSW confirmed that patient was still opposed to the [facility].  Patient stated he will NOT go to [facility].  CSW followed up on the shelter in [place].  Pt stated that he wanted to know about if the shelter is all day.  CSW informed that patient can only stay during the night hours.  Patient stated that he was not sure what he was supposed to do during the day and CSW explained that patient could make his own mind up on the day hours.  CSW then asked patient where else he wanted to go and pt stated "[place]".  Pt was unable to identify who or where he would stay in [place].  CSW explained that transportation would not be able to just drop patient off in the [place] area. Pt stated that "I will find someone who will give me their address to say I live there.  You don't have to speak to anyone right?"  CSW explained that she will follow up with leadership, however, it is likely that CSW will need to follow up wherever the pt will be staying.  Penni Homans, MSW, LCSW 01/19/2023 4:01 PM

## 2023-01-20 MED ORDER — DULOXETINE HCL 60 MG PO CPEP: 60.0000 mg | ORAL_CAPSULE | Freq: Every morning | ORAL | 3 refills | Status: AC

## 2023-01-20 MED ORDER — DOXEPIN HCL 100 MG PO CAPS: 100.0000 mg | ORAL_CAPSULE | Freq: Every day | ORAL | 3 refills | Status: AC

## 2023-01-20 MED ORDER — TRAZODONE HCL 100 MG PO TABS: 100.0000 mg | ORAL_TABLET | Freq: Every evening | ORAL | 3 refills | Status: AC | PRN

## 2023-01-20 MED ORDER — GABAPENTIN 300 MG PO CAPS: 300.0000 mg | ORAL_CAPSULE | Freq: Three times a day (TID) | ORAL | 3 refills | Status: AC

## 2023-01-20 NOTE — Plan of Care (Signed)

## 2023-01-20 NOTE — BHH Suicide Risk Assessment (Signed)
[facility] Discharge Suicide Risk Assessment   Principal Problem: MDD (major depressive disorder), recurrent severe, without psychosis (HCC) Discharge Diagnoses: Principal Problem:   MDD (major depressive disorder), recurrent severe, without psychosis (HCC) Active Problems:   S/P AKA (above knee amputation) bilateral (HCC)   Chronic low back pain   Cocaine abuse (HCC)   Opiate dependence (HCC)   Homelessness   Facial cellulitis   Total Time spent with patient: 15 minutes  Musculoskeletal: Strength & Muscle Tone: within normal limits Gait & Station: unable to stand Patient leans: N/A  Psychiatric Specialty Exam  Presentation  General Appearance:  Appropriate for Environment  Eye Contact: Fair  Speech: Clear and Coherent  Speech Volume: Increased  Handedness:No data recorded  Mood and Affect  Mood: Dysphoric  Duration of Depression Symptoms: Greater than two weeks  Affect: Congruent   Thought Process  Thought Processes: Coherent; Goal Directed  Descriptions of Associations:Intact  Orientation:Full (Time, Place and Person)  Thought Content:Logical  History of Schizophrenia/Schizoaffective disorder:No  Duration of Psychotic Symptoms:No data recorded Hallucinations:No data recorded Ideas of Reference:None  Suicidal Thoughts:No data recorded Homicidal Thoughts:No data recorded  Sensorium  Memory: Immediate Fair; Recent Fair; Remote Fair  Judgment: Poor  Insight: Poor   Executive Functions  Concentration: Fair  Attention Span: Fair  Recall: [employer] of Knowledge: Fair  Language: Fair   Psychomotor Activity  Psychomotor Activity:No data recorded  Assets  Assets: Communication Skills; Desire for Improvement   Sleep  Sleep:No data recorded   Blood pressure 119/67, pulse 80, temperature 97.8 F (36.6 C), temperature source Oral, resp. rate 18, weight 74.8 kg, SpO2 97 %. Body mass index is 21.18 kg/m.  Mental Status Per  Nursing Assessment::   On Admission:  Suicidal ideation indicated by patient, Suicide plan  Demographic Factors:  Male and Caucasian  Loss Factors: NA  Historical Factors: NA  Risk Reduction Factors:   NA  Continued Clinical Symptoms:  Depression:   Anhedonia  Cognitive Features That Contribute To Risk:  Closed-mindedness    Suicide Risk:  Minimal: No identifiable suicidal ideation.  Patients presenting with no risk factors but with morbid ruminations; may be classified as minimal risk based on the severity of the depressive symptoms    Plan Of Care/Follow-up recommendations: See SW note   Sarina Ill, DO 01/20/2023, 10:04 AM

## 2023-01-20 NOTE — Group Note (Signed)
Recreation Therapy Group Note   Group Topic:Coping Skills  Group Date: 01/20/2023 Start Time: 1400 End Time: 1440 Facilitators: Rosina Lowenstein, LRT, CTRS Location: Courtyard  Group Description: Coping Skills Hot Potato. LRT and patients discussed the definition of what a coping skill is. LRT and pts discussed the importance of using coping skills and different times when they can be used. LRT and pts sat in a circle and tossed around a small ball, in random order, while listening to music. When the music stopped, whoever had the ball in their hands were encouraged to name a coping skill. LRT added in a second ball to make things more challenging.   Goal Area(s) Addressed: Patients will be able to define "coping skills". Patient will identify new coping skills.  Patient will increase communication. Patient will gain team building skills.  Affect/Mood: Appropriate   Participation Level: Moderate   Participation Quality: Independent   Behavior: Calm and Cooperative   Speech/Thought Process: Coherent   Insight: Fair   Judgement: Good   Modes of Intervention: Activity and Guided Discussion   Patient Response to Interventions:  Receptive   Education Outcome:  In group clarification offered    Clinical Observations/Individualized Feedback: Govan was mostly active in their participation of session activities and group discussion. Pt came to group late after sharing that he was waiting on his ride to pick him up. Pt was not present for game or discussion.    Plan: Continue to engage patient in RT group sessions 2-3x/week.   Rosina Lowenstein, LRT, CTRS 01/20/2023 2:57 PM

## 2023-01-20 NOTE — Progress Notes (Signed)
  [facility] Adult Case Management Discharge Plan :  Will you be returning to the same living situation after discharge:  Yes,  pt reports that he will be staying with a peer. At discharge, do you have transportation home?: Yes,  CSW to assist with transportation needs. Do you have the ability to pay for your medications: Yes,  [place] MEDICAID PREPAID HEALTH PLAN / [place] MEDICAID [facility] COMMUNITY  Release of information consent forms completed and in the chart;  Patient's signature needed at discharge.  Patient to Follow up at:  Follow-up Information     [facility] Follow up.   Why: Walk in hours are 24/7 at the [facility].  Photo ID, insurance card, social security care and proof of income.  If you don't have an ID, you can still be seen but ask that ID be pritorized for following visists.  If you are seeking detox please bring your medications with you. Contact information: [address] [place] [address] [phone]         [facility] Follow up.   Why: Walk in hours are Monday, Wednesday and Friday 8AM to 2PM. Contact information: [address] [facility] [address] [phone]                 Next level of care provider has access to [facility] Link:no  Safety Planning and Suicide Prevention discussed: Yes,  SPE completed with the patient.      Has patient been referred to the Quitline?: Patient refused referral for treatment  Patient has been referred for addiction treatment: Patient refused referral for treatment.  Harden Mo, LCSW 01/20/2023, 10:08 AM

## 2023-01-20 NOTE — Discharge Summary (Signed)
Physician Discharge Summary Note  Patient:  Jeffery Gross is an 62 y.o., male MRN:  [id number] DOB:  [birth year] Patient phone:  [phone] (home)  Patient address:   [address] [place] [address],  Total Time spent with patient: 1 hour  Date of Admission:  01/08/2023 Date of Discharge: 01/20/2023  Reason for Admission:   Patient seen and chart reviewed.  62 year old man who presented to [facility] saying he had suicidal ideation.  He mentioned thinking that he would overdose and then put a bag over his head.  Describes mood as being depressed.  Feels hopeless about his situation.  [place] he has no place to stay.  Using crack cocaine regularly.  Has not been taking medicine for quite a while specifically has been out of his narcotics.  Patient has multiple medical and social problems.  He has bilateral above-the-knee amputations and is wheelchair dependent.  Family is estranged from him and not letting him stay there.  He does get SSI although it is unclear what he does with his check.  Patient is not presenting as psychotic.  Primarily focused on homelessness and pain.  Chronic pain issues chronic use of narcotics.  It appears that he had been seeing a provider in [place] who had been giving him pretty high doses of narcotics for a while but recently ran out or cut him off and so now he has had several hospitalizations.   Principal Problem: MDD (major depressive disorder), recurrent severe, without psychosis (HCC) Discharge Diagnoses: Principal Problem:   MDD (major depressive disorder), recurrent severe, without psychosis (HCC) Active Problems:   S/P AKA (above knee amputation) bilateral (HCC)   Chronic low back pain   Cocaine abuse (HCC)   Opiate dependence (HCC)   Homelessness   Facial cellulitis   Past Psychiatric History: Chronic pain and depression  Past Medical History:  Past Medical History:  Diagnosis Date   Back pain     Chest pain    Depression    DVT (deep venous thrombosis) (HCC)    GERD (gastroesophageal reflux disease)    History of kidney stones    passed   HTN (hypertension)    no longer has HTN    Past Surgical History:  Procedure Laterality Date   AMPUTATION Bilateral 01/09/2016   Procedure: Bilateral Above Knee Amputation, Apply Wound VAC;  Surgeon: Nadara Mustard, MD;  Location: [facility] OR;  Service: Orthopedics;  Laterality: Bilateral;   AMPUTATION Right 08/09/2022   Procedure: RIGHT REVISION AMPUTATION ABOVE KNEE;  Surgeon: Nadara Mustard, MD;  Location: [facility] OR;  Service: Orthopedics;  Laterality: Right;   FASCIOTOMY Bilateral 01/03/2016   Procedure: FASCIOTOMY;  Surgeon: Tarry Kos, MD;  Location: [facility] OR;  Service: Orthopedics;  Laterality: Bilateral;   I & D EXTREMITY Bilateral 01/05/2016   Procedure: IRRIGATION AND DEBRIDEMENT BILATERAL LOWER EXTREMITIES; WOUND VAC CHANGE;  Surgeon: Tarry Kos, MD;  Location: [facility] OR;  Service: Orthopedics;  Laterality: Bilateral;   I & D EXTREMITY Left 01/07/2016   Procedure: Irrigation and Debridement of Left Lower Extremity with Application of Wound Vac;  Surgeon: Tarry Kos, MD;  Location: [facility] OR;  Service: Orthopedics;  Laterality: Left;   STUMP REVISION Right 07/04/2022   Procedure: REVISION RIGHT ABOVE KNEE AMPUTATION;  Surgeon: Nadara Mustard, MD;  Location: [facility] OR;  Service: Orthopedics;  Laterality: Right;   VASECTOMY     Family History:  Family History  Problem  Relation Age of Onset   Hypertension Father    Family Psychiatric  History: Unremarkable Social History:  Social History   Substance and Sexual Activity  Alcohol Use Yes   Comment: rare     Social History   Substance and Sexual Activity  Drug Use Yes   Types: Cocaine    Social History   Socioeconomic History   Marital status: Single    Spouse name: Not on file   Number of children: 3   Years of education: Not on file   Highest education level: Not on file  Occupational  History   Occupation: disabled  Tobacco Use   Smoking status: Every Day    Packs/day: 0.25    Years: 5.00    Additional pack years: 0.00    Total pack years: 1.25    Types: Cigarettes   Smokeless tobacco: Current  Vaping Use   Vaping Use: Every day   Substances: Nicotine  Substance and Sexual Activity   Alcohol use: Yes    Comment: rare   Drug use: Yes    Types: Cocaine   Sexual activity: Not on file  Other Topics Concern   Not on file  Social History Narrative   Not on file   Social Determinants of Health   Financial Resource Strain: Not on file  Food Insecurity: Food Insecurity Present (01/08/2023)   Hunger Vital Sign    Worried About Running Out of Food in the Last Year: Sometimes true    Ran Out of Food in the Last Year: Sometimes true  Transportation Needs: No Transportation Needs (01/08/2023)   PRAPARE - Administrator, Civil Service (Medical): No    Lack of Transportation (Non-Medical): No  Physical Activity: Not on file  Stress: Not on file  Social Connections: Not on file    Hospital Course: Shana is voluntarily admitted to geriatric psychiatry for worsening depression and suicidal ideation.  He has a history of substance abuse and depression.  While on the inpatient unit he was started on doxepin which was titrated up to 100 mg at bedtime.  His mood and sleep improved.  He continued on Neurontin 300 mg 3 times a day and Cymbalta.  For the most part he was in good controls and pleasant and cooperative and participated in individual and group therapy.  It was felt that he maximize hospitalization he was discharged.  On the day of discharge she denied suicidal ideation, homicidal ideation, auditory or visual hallucinations.  His judgment and insight were good.  Physical Findings: AIMS:  , ,  ,  ,    CIWA:    COWS:     Musculoskeletal: Strength & Muscle Tone: within normal limits Gait & Station: normal Patient leans: Right   Psychiatric Specialty  Exam:  Presentation  General Appearance:  Appropriate for Environment  Eye Contact: Fair  Speech: Clear and Coherent  Speech Volume: Increased  Handedness:No data recorded  Mood and Affect  Mood: Dysphoric  Affect: Congruent   Thought Process  Thought Processes: Coherent; Goal Directed  Descriptions of Associations:Intact  Orientation:Full (Time, Place and Person)  Thought Content:Logical  History of Schizophrenia/Schizoaffective disorder:No  Duration of Psychotic Symptoms:No data recorded Hallucinations:No data recorded Ideas of Reference:None  Suicidal Thoughts:No data recorded Homicidal Thoughts:No data recorded  Sensorium  Memory: Immediate Fair; Recent Fair; Remote Fair  Judgment: Poor  Insight: Poor   Executive Functions  Concentration: Fair  Attention Span: Fair  Recall: [employer] of Knowledge: Fair  Language: Fair   Psychomotor Activity  Psychomotor Activity:No data recorded  Assets  Assets: Communication Skills; Desire for Improvement   Sleep  Sleep:No data recorded   Physical Exam: Physical Exam Vitals and nursing note reviewed.  Constitutional:      Appearance: Normal appearance. He is normal weight.  Neurological:     General: No focal deficit present.     Mental Status: He is alert and oriented to person, place, and time.  Psychiatric:        Attention and Perception: Attention and perception normal.        Mood and Affect: Mood and affect normal.        Speech: Speech normal.        Behavior: Behavior normal. Behavior is cooperative.        Thought Content: Thought content normal.        Cognition and Memory: Cognition and memory normal.        Judgment: Judgment normal.    Review of Systems  Constitutional: Negative.   HENT: Negative.    Eyes: Negative.   Respiratory: Negative.    Cardiovascular: Negative.   Gastrointestinal: Negative.   Genitourinary: Negative.   Musculoskeletal: Negative.    Skin: Negative.   Neurological: Negative.   Endo/Heme/Allergies: Negative.   Psychiatric/Behavioral: Negative.     Blood pressure 119/67, pulse 80, temperature 97.8 F (36.6 C), temperature source Oral, resp. rate 18, weight 74.8 kg, SpO2 97 %. Body mass index is 21.18 kg/m.   Social History   Tobacco Use  Smoking Status Every Day   Packs/day: 0.25   Years: 5.00   Additional pack years: 0.00   Total pack years: 1.25   Types: Cigarettes  Smokeless Tobacco Current   Tobacco Cessation:  A prescription for an FDA-approved tobacco cessation medication was offered at discharge and the patient refused   Blood Alcohol level:  Lab Results  Component Value Date   ETH <10 01/07/2023   ETH <10 12/04/2022    Metabolic Disorder Labs:  Lab Results  Component Value Date   HGBA1C 5.8 (H) 06/14/2022   MPG 120 06/14/2022   No results found for: "PROLACTIN" No results found for: "CHOL", "TRIG", "HDL", "CHOLHDL", "VLDL", "LDLCALC"  See Psychiatric Specialty Exam and Suicide Risk Assessment completed by Attending Physician prior to discharge.  Discharge destination:  Home  Is patient on multiple antipsychotic therapies at discharge:  No   Has Patient had three or more failed trials of antipsychotic monotherapy by history:  No  Recommended Plan for Multiple Antipsychotic Therapies: NA   Allergies as of 01/20/2023       Reactions   Sulfacetamide Sodium Swelling   Edema         Medication List     STOP taking these medications    doxycycline 100 MG tablet Commonly known as: VIBRA-TABS       TAKE these medications      Indication  doxepin 100 MG capsule Commonly known as: SINEQUAN Take 1 capsule (100 mg total) by mouth at bedtime.  Indication: Depression   DULoxetine 60 MG capsule Commonly known as: CYMBALTA Take 1 capsule (60 mg total) by mouth in the morning. Start taking on: January 21, 2023  Indication: Major Depressive Disorder   gabapentin 300 MG  capsule Commonly known as: NEURONTIN Take 1 capsule (300 mg total) by mouth 3 (three) times daily.  Indication: Diabetes with Nerve Disease, Generalized Anxiety Disorder   omeprazole 20 MG capsule Commonly known as: PRILOSEC Take  20 mg by mouth in the morning.    traZODone 100 MG tablet Commonly known as: DESYREL Take 1 tablet (100 mg total) by mouth at bedtime as needed for sleep.  Indication: Trouble Sleeping, Major Depressive Disorder        Follow-up Information     [facility] Follow up.   Why: Walk in hours are 24/7 at the [facility].  Photo ID, insurance card, social security care and proof of income.  If you don't have an ID, you can still be seen but ask that ID be pritorized for following visists.  If you are seeking detox please bring your medications with you. Contact information: [address] [place] [address] [phone]         [facility] Follow up.   Why: Walk in hours are Monday, Wednesday and Friday 8AM to 2PM. Contact information: [address] [place] [address] [phone]                 Follow-up recommendations:  [facility]    Signed: Sarina Ill, DO 01/20/2023, 10:10 AM

## 2023-01-20 NOTE — Progress Notes (Signed)
Patient present in the dayroom for breakfast.  Patient endorses anxiety about discharge and acquiring his medications. Denies SI, HI and AVH.  Compliant with scheduled medications.  15 min checks in place for safety.  Patient appears in good spirits.  Present in the milieu interacting with peers.

## 2023-01-20 NOTE — BHH Counselor (Signed)
Patient declined aftercare appointment.  CSW provided walk in information.  Penni Homans, MSW, LCSW 01/20/2023 10:09 AM

## 2023-01-20 NOTE — Progress Notes (Signed)
Discharge Note:  Patient denies SI/HI/AVH at this time. Discharge instructions, AVS, prescriptions, and transition reviewed  with patient. Patient agrees to comply with medication management and follow-up visit.   Patient belongings returned to patient. Patient questions and concerns addressed and answered.  Patient discharged with Safe Transport to a friend's house.

## 2023-02-01 ENCOUNTER — Encounter (HOSPITAL_COMMUNITY): Payer: Self-pay

## 2023-02-01 ENCOUNTER — Emergency Department (HOSPITAL_COMMUNITY)
Admission: EM | Admit: 2023-02-01 | Discharge: 2023-02-04 | Disposition: A | Discharge status: Home / Self Care | Payer: Medicaid Other | Attending: Emergency Medicine | Admitting: Emergency Medicine

## 2023-02-01 ENCOUNTER — Other Ambulatory Visit: Payer: Self-pay

## 2023-02-01 DIAGNOSIS — F1424 Cocaine dependence with cocaine-induced mood disorder: Secondary | ICD-10-CM | POA: Insufficient documentation

## 2023-02-01 DIAGNOSIS — F1910 Other psychoactive substance abuse, uncomplicated: Secondary | ICD-10-CM | POA: Diagnosis not present

## 2023-02-01 DIAGNOSIS — R45851 Suicidal ideations: Secondary | ICD-10-CM

## 2023-02-01 DIAGNOSIS — F17210 Nicotine dependence, cigarettes, uncomplicated: Secondary | ICD-10-CM | POA: Insufficient documentation

## 2023-02-01 DIAGNOSIS — I10 Essential (primary) hypertension: Secondary | ICD-10-CM | POA: Insufficient documentation

## 2023-02-01 DIAGNOSIS — F332 Major depressive disorder, recurrent severe without psychotic features: Secondary | ICD-10-CM | POA: Diagnosis not present

## 2023-02-01 DIAGNOSIS — F32A Depression, unspecified: Secondary | ICD-10-CM | POA: Diagnosis present

## 2023-02-01 DIAGNOSIS — F1994 Other psychoactive substance use, unspecified with psychoactive substance-induced mood disorder: Secondary | ICD-10-CM | POA: Diagnosis present

## 2023-02-01 DIAGNOSIS — Z5900 Homelessness unspecified: Secondary | ICD-10-CM | POA: Diagnosis not present

## 2023-02-01 LAB — CBC WITH DIFFERENTIAL/PLATELET
Abs Immature Granulocytes: 0.03 10*3/uL (ref 0.00–0.07)
Basophils Absolute: 0 10*3/uL (ref 0.0–0.1)
Basophils Relative: 0 %
Eosinophils Absolute: 0.5 10*3/uL (ref 0.0–0.5)
Eosinophils Relative: 5 %
HCT: 43.5 % (ref 39.0–52.0)
Hemoglobin: 14.4 g/dL (ref 13.0–17.0)
Immature Granulocytes: 0 %
Lymphocytes Relative: 36 %
Lymphs Abs: 3.4 10*3/uL (ref 0.7–4.0)
MCH: 27.7 pg (ref 26.0–34.0)
MCHC: 33.1 g/dL (ref 30.0–36.0)
MCV: 83.8 fL (ref 80.0–100.0)
Monocytes Absolute: 0.7 10*3/uL (ref 0.1–1.0)
Monocytes Relative: 8 %
Neutro Abs: 4.7 10*3/uL (ref 1.7–7.7)
Neutrophils Relative %: 51 %
Platelets: 245 10*3/uL (ref 150–400)
RBC: 5.19 MIL/uL (ref 4.22–5.81)
RDW: 14.7 % (ref 11.5–15.5)
WBC: 9.3 10*3/uL (ref 4.0–10.5)
nRBC: 0 % (ref 0.0–0.2)

## 2023-02-01 LAB — COMPREHENSIVE METABOLIC PANEL
ALT: 13 U/L (ref 0–44)
AST: 16 U/L (ref 15–41)
Albumin: 3.8 g/dL (ref 3.5–5.0)
Alkaline Phosphatase: 72 U/L (ref 38–126)
Anion gap: 9 (ref 5–15)
BUN: 27 mg/dL — ABNORMAL HIGH (ref 8–23)
CO2: 23 mmol/L (ref 22–32)
Calcium: 9.1 mg/dL (ref 8.9–10.3)
Chloride: 107 mmol/L (ref 98–111)
Creatinine, Ser: 0.99 mg/dL (ref 0.61–1.24)
GFR, Estimated: >60 mL/min (ref >60)
Glucose, Bld: 112 mg/dL — ABNORMAL HIGH (ref 70–99)
Potassium: 3.5 mmol/L (ref 3.5–5.1)
Sodium: 139 mmol/L (ref 135–145)
Total Bilirubin: 0.3 mg/dL (ref 0.3–1.2)
Total Protein: 6.6 g/dL (ref 6.5–8.1)

## 2023-02-01 LAB — RAPID URINE DRUG SCREEN, HOSP PERFORMED
Amphetamines: NOT DETECTED
Barbiturates: NOT DETECTED
Benzodiazepines: NOT DETECTED
Cocaine: POSITIVE — AB
Opiates: NOT DETECTED
Tetrahydrocannabinol: NOT DETECTED

## 2023-02-01 LAB — ETHANOL: Alcohol, Ethyl (B): <10 mg/dL (ref <10)

## 2023-02-01 MED ORDER — TRAZODONE HCL 50 MG PO TABS
50.0000 mg | ORAL_TABLET | Freq: Every day | ORAL | Status: DC
  Administered 2023-02-01 – 2023-02-03 (×4): 50 mg via ORAL
  Filled 2023-02-01 (×4): qty 1

## 2023-02-01 MED ORDER — OXYCODONE-ACETAMINOPHEN 5-325 MG PO TABS
1.0000 | ORAL_TABLET | Freq: Once | ORAL | Status: AC
  Administered 2023-02-01: 1 via ORAL
  Filled 2023-02-01: qty 1

## 2023-02-01 NOTE — ED Triage Notes (Signed)
Pt to ED via [facility] stating he took a "handful" of gabapentin in attempt to end his life due to chronic pain, depression and homelessness.

## 2023-02-01 NOTE — BH Assessment (Signed)
@  1308, requested patient's nurse to place the TTS machine in patient's room for his initial TTS assessment.

## 2023-02-01 NOTE — ED Notes (Signed)
Pt received breakfast tray 

## 2023-02-01 NOTE — Progress Notes (Signed)
Patient has been denied by [facility] due to no appropriate beds available. Patient meets BH inpatient criteria per Hillery Jacks, NP. Patient has been faxed out to the following facilities:  [facility]  [address], [place] [address] [phone]  [facility] [address]  [address], [place] [address] [phone]  [facility]  [address] [place], [place] [address] [phone]  [facility]  [address] [place] [address] [phone]  [facility]  [address] [place] [address] [phone]  [facility]  [address] [place], [place] [address] [phone]  [facility]  [address] [place] [address] [phone]  [facility]  [address] [place] [address] [phone]  [facility]  [address], [place] [address] [phone]  [facility]  [address] [place], [place] [address] [phone]  [facility]  [address] [place] [address] [phone]  [facility]  [address], [place] [address] [phone]  [facility]  [address], [place] [address] [phone]  [facility]  [address] [place] [address] [phone]   Damita Dunnings, MSW, LCSW-A  10:20 AM 02/01/2023

## 2023-02-01 NOTE — ED Notes (Signed)
Pt wanded by security after changing into Maroon scrubs. Pt belongings, including black wheel chair, black book bag, Pt belongings bag filled with clothes, and red cooler placed in Pt locker area for safe keeping. Pt had 2 knives found in his possession, and both were given to Security for safety.

## 2023-02-01 NOTE — ED Notes (Signed)
Pt provided beverage and meal tray at this time. Pt reports his pain is unbearable and he wants to "wrap a cord around" his neck. Pt then repeats to this RN that everytime he thinks about killing himself, he "sees" his daughter, and he decides there is a reason to keep living.

## 2023-02-01 NOTE — ED Notes (Signed)
TTS Cart at bedside awaiting TTS Consult
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
## 2023-02-01 NOTE — ED Notes (Signed)
Pt had large clear plastic bag filled with varying medications. These have been counted and counted and given to [facility] for safe storage and handling.

## 2023-02-01 NOTE — BH Assessment (Signed)
Contacted [facility] and requested tele-psychiatry consult. Spoke with Faith who said to start a conversation with Marcy Salvo, who will message when they have a consult time. Included Marcy Salvo, Dr Kennis Carina, and Cherylin Mylar, RN.   Pamalee Leyden, [facility], [facility] Triage Specialist

## 2023-02-01 NOTE — ED Provider Notes (Signed)
[facility] DEPT [facility] Emergency Department Provider Note MRN:  [id number]  Arrival date & time: 02/01/23     Chief Complaint   Suicidal   History of Present Illness   Jeffery Gross is a 62 y.o. year-old male with a history of bilateral above-the-knee amputations, DVT, hypertension presenting to the ED with chief complaint of suicidal.  Patient becoming more and more depressed, wanted to overdose on his home gabapentin and then put a plastic bag over his head and go die in the woods.  Has had a lot of issues with his chronic pain, family issues.  Review of Systems  A thorough review of systems was obtained and all systems are negative except as noted in the HPI and PMH.   Patient's Health History    Past Medical History:  Diagnosis Date   Back pain    Chest pain    Depression    DVT (deep venous thrombosis) (HCC)    GERD (gastroesophageal reflux disease)    History of kidney stones    passed   HTN (hypertension)    no longer has HTN    Past Surgical History:  Procedure Laterality Date   AMPUTATION Bilateral 01/09/2016   Procedure: Bilateral Above Knee Amputation, Apply Wound VAC;  Surgeon: Nadara Mustard, MD;  Location: [facility] OR;  Service: Orthopedics;  Laterality: Bilateral;   AMPUTATION Right 08/09/2022   Procedure: RIGHT REVISION AMPUTATION ABOVE KNEE;  Surgeon: Nadara Mustard, MD;  Location: [facility] OR;  Service: Orthopedics;  Laterality: Right;   FASCIOTOMY Bilateral 01/03/2016   Procedure: FASCIOTOMY;  Surgeon: Tarry Kos, MD;  Location: [facility] OR;  Service: Orthopedics;  Laterality: Bilateral;   I & D EXTREMITY Bilateral 01/05/2016   Procedure: IRRIGATION AND DEBRIDEMENT BILATERAL LOWER EXTREMITIES; WOUND VAC CHANGE;  Surgeon: Tarry Kos, MD;  Location: [facility] OR;  Service: Orthopedics;  Laterality: Bilateral;   I & D EXTREMITY Left 01/07/2016   Procedure: Irrigation and Debridement of Left Lower Extremity with Application of Wound Vac;  Surgeon: Tarry Kos, MD;   Location: [facility] OR;  Service: Orthopedics;  Laterality: Left;   STUMP REVISION Right 07/04/2022   Procedure: REVISION RIGHT ABOVE KNEE AMPUTATION;  Surgeon: Nadara Mustard, MD;  Location: [facility] OR;  Service: Orthopedics;  Laterality: Right;   VASECTOMY      Family History  Problem Relation Age of Onset   Hypertension Father     Social History   Socioeconomic History   Marital status: Single    Spouse name: Not on file   Number of children: 3   Years of education: Not on file   Highest education level: Not on file  Occupational History   Occupation: disabled  Tobacco Use   Smoking status: Every Day    Current packs/day: 0.25    Average packs/day: 0.3 packs/day for 5.0 years (1.3 ttl pk-yrs)    Types: Cigarettes   Smokeless tobacco: Current  Vaping Use   Vaping status: Every Day   Substances: Nicotine  Substance and Sexual Activity   Alcohol use: Yes    Comment: rare   Drug use: Yes    Types: Cocaine   Sexual activity: Not on file  Other Topics Concern   Not on file  Social History Narrative   Not on file   Social Determinants of Health   Financial Resource Strain: Not on file  Food Insecurity: Food Insecurity Present (01/08/2023)   Hunger Vital Sign    Worried About Running Out of  Food in the Last Year: Sometimes true    Ran Out of Food in the Last Year: Sometimes true  Transportation Needs: No Transportation Needs (01/08/2023)   PRAPARE - Administrator, Civil Service (Medical): No    Lack of Transportation (Non-Medical): No  Physical Activity: Not on file  Stress: Not on file  Social Connections: Not on file  Intimate Partner Violence: Not At Risk (01/08/2023)   Humiliation, Afraid, Rape, and Kick questionnaire    Fear of Current or Ex-Partner: No    Emotionally Abused: No    Physically Abused: No    Sexually Abused: No     Physical Exam   Vitals:   02/01/23 0320  BP: (!) 129/97  Pulse: 77  Resp: 18  Temp: 97.7 F (36.5 C)  SpO2: 100%     CONSTITUTIONAL: Chronically ill-appearing, NAD NEURO/PSYCH:  Alert and oriented x 3, no focal deficits EYES:  eyes equal and reactive ENT/NECK:  no LAD, no JVD CARDIO: Regular rate, well-perfused, normal S1 and S2 PULM:  CTAB no wheezing or rhonchi GI/GU:  non-distended, non-tender MSK/SPINE:  No gross deformities, no edema SKIN:  no rash, atraumatic   *Additional and/or pertinent findings included in MDM below  Diagnostic and Interventional Summary    EKG Interpretation Date/Time:  Sunday February 01 2023 03:49:00 EDT Ventricular Rate:  74 PR Interval:  156 QRS Duration:  100 QT Interval:  382 QTC Calculation: 424 R Axis:   -67  Text Interpretation: Normal sinus rhythm Left anterior fascicular block Nonspecific T wave abnormality Abnormal ECG When compared with ECG of 04-Dec-2022 17:27, No significant change was found Confirmed by Kennis Carina [phone]) on 02/01/2023 7:50:26 AM       Labs Reviewed  COMPREHENSIVE METABOLIC PANEL - Abnormal; Notable for the following components:      Result Value   Glucose, Bld 112 (*)    BUN 27 (*)    All other components within normal limits  ETHANOL  CBC WITH DIFFERENTIAL/PLATELET  RAPID URINE DRUG SCREEN, HOSP PERFORMED    No orders to display    Medications  traZODone (DESYREL) tablet 50 mg (50 mg Oral Given 02/01/23 0600)     Procedures  /  Critical Care Procedures  ED Course and Medical Decision Making  Initial Impression and Ddx Suicidal ideation, appears that he was recently discharged from a psychiatric facility.  Will reconsult TTS for recommendations.  Past medical/surgical history that increases complexity of ED encounter: Depression, above-the-knee amputations, substance use disorder  Interpretation of Diagnostics I personally reviewed the EKG and my interpretation is as follows: Sinus rhythm  Labs overall reassuring with no significant blood count or electrolyte disturbance  Patient Reassessment and Ultimate  Disposition/Management     Medically cleared awaiting TTS recommendations.  Signed out to default provider.  Patient management required discussion with the following services or consulting groups:  Psychiatry/TTS  Complexity of Problems Addressed Acute illness or injury that poses threat of life of bodily function  Additional Data Reviewed and Analyzed Further history obtained from: None  Additional Factors Impacting ED Encounter Risk Consideration of hospitalization  Elmer Sow. Pilar Plate, MD [facility] Emergency Medicine [facility] [email or web address]  Final Clinical Impressions(s) / ED Diagnoses     ICD-10-CM   1. Suicidal ideation  R45.851       ED Discharge Orders     None        Discharge Instructions Discussed with and Provided to Patient:   Discharge Instructions  None      Sabas Sous, MD 02/01/23 [phone]

## 2023-02-02 MED ORDER — NICOTINE 21 MG/24HR TD PT24
21.0000 mg | MEDICATED_PATCH | Freq: Once | TRANSDERMAL | Status: AC
  Administered 2023-02-02: 21 mg via TRANSDERMAL
  Filled 2023-02-02: qty 1

## 2023-02-02 MED ORDER — OXYCODONE HCL 5 MG PO TABS
20.0000 mg | ORAL_TABLET | Freq: Once | ORAL | Status: AC
  Administered 2023-02-02: 20 mg via ORAL
  Filled 2023-02-02: qty 4

## 2023-02-02 MED ORDER — GABAPENTIN 300 MG PO CAPS
300.0000 mg | ORAL_CAPSULE | Freq: Once | ORAL | Status: AC
  Administered 2023-02-02: 300 mg via ORAL
  Filled 2023-02-02: qty 1

## 2023-02-02 NOTE — ED Notes (Signed)
Pt tearful in room Asked to see his RN RN made aware and is at bedside

## 2023-02-02 NOTE — ED Notes (Addendum)
Pt is asking for an antidepressant medication.

## 2023-02-02 NOTE — ED Notes (Signed)
Pt assisted to recliner to see tv more clearly

## 2023-02-02 NOTE — ED Notes (Addendum)
Dr. Charm Barges said to let psychiatry know about pt request.

## 2023-02-03 ENCOUNTER — Encounter (HOSPITAL_COMMUNITY): Payer: Self-pay | Admitting: Registered Nurse

## 2023-02-03 DIAGNOSIS — Z5900 Homelessness unspecified: Secondary | ICD-10-CM

## 2023-02-03 DIAGNOSIS — F1910 Other psychoactive substance abuse, uncomplicated: Secondary | ICD-10-CM

## 2023-02-03 DIAGNOSIS — F332 Major depressive disorder, recurrent severe without psychotic features: Secondary | ICD-10-CM

## 2023-02-03 DIAGNOSIS — R45851 Suicidal ideations: Secondary | ICD-10-CM

## 2023-02-03 MED ORDER — OXYCODONE-ACETAMINOPHEN 5-325 MG PO TABS
2.0000 | ORAL_TABLET | Freq: Once | ORAL | Status: AC
  Administered 2023-02-03: 2 via ORAL
  Filled 2023-02-03: qty 2

## 2023-02-03 MED ORDER — NICOTINE 14 MG/24HR TD PT24
14.0000 mg | MEDICATED_PATCH | Freq: Once | TRANSDERMAL | Status: AC
  Administered 2023-02-03: 14 mg via TRANSDERMAL
  Filled 2023-02-03: qty 1

## 2023-02-03 MED ORDER — OXYCODONE HCL 5 MG PO TABS
10.0000 mg | ORAL_TABLET | Freq: Three times a day (TID) | ORAL | Status: DC | PRN
  Administered 2023-02-03 – 2023-02-04 (×2): 10 mg via ORAL
  Filled 2023-02-03 (×2): qty 2

## 2023-02-03 MED ORDER — OXYCODONE-ACETAMINOPHEN 5-325 MG PO TABS
2.0000 | ORAL_TABLET | Freq: Three times a day (TID) | ORAL | Status: DC
  Administered 2023-02-03 (×2): 2 via ORAL
  Filled 2023-02-03 (×2): qty 2

## 2023-02-03 NOTE — ED Notes (Signed)
TTS machine in PT's room.

## 2023-02-03 NOTE — ED Notes (Signed)
 Pt received lunch tray 

## 2023-02-03 NOTE — ED Provider Notes (Signed)
Emergency Medicine Observation Re-evaluation Note  Jeffery Gross is a 62 y.o. male, seen on rounds today.  Pt initially presented to the ED for complaints of Suicidal Currently, the patient is awaiting psychiatric placement.  Physical Exam  BP 136/71 (BP Location: Left Arm)   Pulse 77   Temp 97.8 F (36.6 C)   Resp 16   Ht 6\' 2"  (1.88 m)   Wt 74.8 kg   SpO2 98%   BMI 21.18 kg/m  Physical Exam Alert and in no acute distress  ED Course / MDM  EKG:EKG Interpretation Date/Time:  Sunday February 01 2023 03:49:00 EDT Ventricular Rate:  74 PR Interval:  156 QRS Duration:  100 QT Interval:  382 QTC Calculation: 424 R Axis:   -67  Text Interpretation: Normal sinus rhythm Left anterior fascicular block Nonspecific T wave abnormality Abnormal ECG When compared with ECG of 04-Dec-2022 17:27, No significant change was found Confirmed by Kennis Carina [phone]) on 02/01/2023 7:50:26 AM  I have reviewed the labs performed to date as well as medications administered while in observation.  Recent changes in the last 24 hours include none.  Plan  Current plan is for psychiatric placement.    Bethann Berkshire, MD 02/03/23 [phone]

## 2023-02-03 NOTE — ED Notes (Signed)
Pt peacefully sleeping, will attempt vitals when patient wake up

## 2023-02-03 NOTE — Consult Note (Signed)
Telepsych Consultation   Reason for Consult:  Suicidal ideation Referring Physician:  Sabas Sous, MD  Location of Patient: [facility] Location of Provider: Other: [facility]  Patient Identification: Jeffery Gross MRN:  [id number] Principal Diagnosis: MDD (major depressive disorder), recurrent severe, without psychosis (HCC) Diagnosis:  Principal Problem:   MDD (major depressive disorder), recurrent severe, without psychosis (HCC) Active Problems:   Suicidal ideation   Substance induced mood disorder (HCC)   Homelessness   Polysubstance abuse (HCC)   Total Time spent with patient: 30 minutes  Subjective:  Jeffery Gross, a 62 year old male with a significant psychiatric history of Major Depressive Disorder (MDD), polysubstance abuse (cocaine, marijuana, alcohol), past suicide attempts.  He also has a medical history of bilateral above-the-knee amputations, deep vein thrombosis (DVT), and hypertension.  Patient admitted to [facility] ED after presenting with complaints of suicidal ideations and a plan. Chart review noted patient recently discharged on 01/20/2023 where he had presented with the same complaint and main stressor focused on his pain management.  HPI:  Jeffery Gross virtually via tele psych by this provider, chart reviewed, and consulted with Dr. Nelly Rout on 02/03/23.  On evaluation Jeffery Gross reports he continues to have suicidal ideation with plan and unable to contract for safety.  He reports his primary stressor is his pain and homelessness, with no support system.  Patient the goes on to inform how his pain medication dosage has been decreased and he is not getting as much as yesterday stating "I'm in so much pain and my mind is affecting me.  Yes I'm still suicidal.  I think I've lived out my life cycle."  The states when asked about his plan for suicide "Pretty much anything that will make my self quit breathing."  During evaluation Jeffery Gross is seated on side  of bed eating his breakfast.  There is no noted distress.  He is alert/oriented x 4, calm, cooperative, attentive, and responses were relevant and appropriate to assessment questions.  He spoke in a clear tone at moderate volume, and normal pace, with good eye contact.   He denies homicidal ideation, psychosis, and paranoia but continues to endorse suicidal ideation and unable to contract for safety.  Objectively:  there is no evidence of psychosis/mania or delusional thinking.  He conversed coherently, with no distractibility, or pre-occupation.  Continue to recommend inpatient psychiatric treatment.  Past Psychiatric History:  Major Depressive Disorder (MDD), polysubstance abuse (cocaine, marijuana, alcohol), past suicide attempts.  Risk to Self:  Yes Risk to Others:  Denies Prior Inpatient Therapy:  Yes Prior Outpatient Therapy:  Yes  Past Medical History:  Past Medical History:  Diagnosis Date   Back pain    Chest pain    Depression    DVT (deep venous thrombosis) (HCC)    GERD (gastroesophageal reflux disease)    History of kidney stones    passed   HTN (hypertension)    no longer has HTN    Past Surgical History:  Procedure Laterality Date   AMPUTATION Bilateral 01/09/2016   Procedure: Bilateral Above Knee Amputation, Apply Wound VAC;  Surgeon: Nadara Mustard, MD;  Location: [facility] OR;  Service: Orthopedics;  Laterality: Bilateral;   AMPUTATION Right 08/09/2022   Procedure: RIGHT REVISION AMPUTATION ABOVE KNEE;  Surgeon: Nadara Mustard, MD;  Location: [facility] OR;  Service: Orthopedics;  Laterality: Right;   FASCIOTOMY Bilateral 01/03/2016   Procedure: FASCIOTOMY;  Surgeon: Tarry Kos, MD;  Location:  [facility] OR;  Service: Orthopedics;  Laterality: Bilateral;   I & D EXTREMITY Bilateral 01/05/2016   Procedure: IRRIGATION AND DEBRIDEMENT BILATERAL LOWER EXTREMITIES; WOUND VAC CHANGE;  Surgeon: Tarry Kos, MD;  Location: [facility] OR;  Service: Orthopedics;  Laterality: Bilateral;   I & D EXTREMITY Left  01/07/2016   Procedure: Irrigation and Debridement of Left Lower Extremity with Application of Wound Vac;  Surgeon: Tarry Kos, MD;  Location: [facility] OR;  Service: Orthopedics;  Laterality: Left;   STUMP REVISION Right 07/04/2022   Procedure: REVISION RIGHT ABOVE KNEE AMPUTATION;  Surgeon: Nadara Mustard, MD;  Location: [facility] OR;  Service: Orthopedics;  Laterality: Right;   VASECTOMY     Family History:  Family History  Problem Relation Age of Onset   Hypertension Father    Family Psychiatric  History: None reported  Social History:  Social History   Substance and Sexual Activity  Alcohol Use Yes   Comment: rare     Social History   Substance and Sexual Activity  Drug Use Yes   Types: Cocaine    Social History   Socioeconomic History   Marital status: Single    Spouse name: Not on file   Number of children: 3   Years of education: Not on file   Highest education level: Not on file  Occupational History   Occupation: disabled  Tobacco Use   Smoking status: Every Day    Current packs/day: 0.25    Average packs/day: 0.3 packs/day for 5.0 years (1.3 ttl pk-yrs)    Types: Cigarettes   Smokeless tobacco: Current  Vaping Use   Vaping status: Every Day   Substances: Nicotine  Substance and Sexual Activity   Alcohol use: Yes    Comment: rare   Drug use: Yes    Types: Cocaine   Sexual activity: Not on file  Other Topics Concern   Not on file  Social History Narrative   Not on file   Social Determinants of Health   Financial Resource Strain: Not on file  Food Insecurity: Food Insecurity Present (01/08/2023)   Hunger Vital Sign    Worried About Running Out of Food in the Last Year: Sometimes true    Ran Out of Food in the Last Year: Sometimes true  Transportation Needs: No Transportation Needs (01/08/2023)   PRAPARE - Administrator, Civil Service (Medical): No    Lack of Transportation (Non-Medical): No  Physical Activity: Not on file  Stress: Not on file   Social Connections: Not on file   Additional Social History:    Allergies:   Allergies  Allergen Reactions   Sulfacetamide Sodium Swelling    Edema     Labs:  Results for orders placed or performed during the hospital encounter of 02/01/23 (from the past 48 hour(s))  Urine rapid drug screen (hosp performed)     Status: Abnormal   Collection Time: 02/01/23 12:44 PM  Result Value Ref Range   Opiates NONE DETECTED NONE DETECTED   Cocaine POSITIVE (A) NONE DETECTED   Benzodiazepines NONE DETECTED NONE DETECTED   Amphetamines NONE DETECTED NONE DETECTED   Tetrahydrocannabinol NONE DETECTED NONE DETECTED   Barbiturates NONE DETECTED NONE DETECTED    Comment: (NOTE) DRUG SCREEN FOR MEDICAL PURPOSES ONLY.  IF CONFIRMATION IS NEEDED FOR ANY PURPOSE, NOTIFY LAB WITHIN 5 DAYS.  LOWEST DETECTABLE LIMITS FOR URINE DRUG SCREEN Drug Class  Cutoff (ng/mL) Amphetamine and metabolites    1000 Barbiturate and metabolites    200 Benzodiazepine                 200 Opiates and metabolites        300 Cocaine and metabolites        300 THC                            50 Performed at [facility], [address], [place], [place] [address]     Medications:  Current Facility-Administered Medications  Medication Dose Route Frequency Provider Last Rate Last Admin   nicotine (NICODERM CQ - dosed in mg/24 hours) patch 21 mg  21 mg Transdermal Once Edwin Dada P, DO   21 mg at 02/02/23 1250   oxyCODONE-acetaminophen (PERCOCET/ROXICET) 5-325 MG per tablet 2 tablet  2 tablet Oral TID Gilda Crease, MD   2 tablet at 02/03/23 0919   traZODone (DESYREL) tablet 50 mg  50 mg Oral QHS Sabas Sous, MD   50 mg at 02/02/23 2128   Current Outpatient Medications  Medication Sig Dispense Refill   doxepin (SINEQUAN) 100 MG capsule Take 1 capsule (100 mg total) by mouth at bedtime. 30 capsule 3   DULoxetine (CYMBALTA) 60 MG capsule Take 1 capsule (60 mg total) by mouth in the  morning. 30 capsule 3   gabapentin (NEURONTIN) 300 MG capsule Take 1 capsule (300 mg total) by mouth 3 (three) times daily. 90 capsule 3   traZODone (DESYREL) 100 MG tablet Take 1 tablet (100 mg total) by mouth at bedtime as needed for sleep. 30 tablet 3   omeprazole (PRILOSEC) 20 MG capsule Take 20 mg by mouth in the morning. (Patient not taking: Reported on 02/01/2023)      Musculoskeletal: Strength & Muscle Tone: within normal limits Gait & Station:  Bilateral above knee amputation, wheel chair, able to transfer self Patient leans: N/A  Psychiatric Specialty Exam:  Presentation  General Appearance:  Appropriate for Environment  Eye Contact: Fair  Speech: Clear and Coherent  Speech Volume: Increased  Handedness:No data recorded  Mood and Affect  Mood: Dysphoric  Affect: Congruent   Thought Process  Thought Processes: Coherent; Goal Directed  Descriptions of Associations:Intact  Orientation:Full (Time, Place and Person)  Thought Content:Logical  History of Schizophrenia/Schizoaffective disorder:No  Duration of Psychotic Symptoms:No data recorded Hallucinations:No data recorded Ideas of Reference:None  Suicidal Thoughts:No data recorded Homicidal Thoughts:No data recorded  Sensorium  Memory: Immediate Fair; Recent Fair; Remote Fair  Judgment: Poor  Insight: Poor   Executive Functions  Concentration: Fair  Attention Span: Fair  Recall: [employer] of Knowledge: Fair  Language: Fair   Psychomotor Activity  Psychomotor Activity:No data recorded  Assets  Assets: Communication Skills; Desire for Improvement   Sleep  Sleep:No data recorded   Physical Exam: Physical Exam Vitals and nursing note reviewed.  Constitutional:      General: He is not in acute distress.    Appearance: Normal appearance. He is not ill-appearing.  Cardiovascular:     Rate and Rhythm: Normal rate.  Pulmonary:     Effort: Pulmonary effort is normal.  No respiratory distress.  Neurological:     Mental Status: He is alert and oriented to person, place, and time.  Psychiatric:        Attention and Perception: Attention and perception normal. He does not perceive auditory or visual hallucinations.  Mood and Affect: Affect normal. Mood is depressed.        Speech: Speech normal.        Behavior: Behavior normal. Behavior is cooperative.        Thought Content: Thought content is not paranoid or delusional. Thought content includes suicidal ideation. Thought content does not include homicidal ideation. Thought content includes suicidal plan.        Cognition and Memory: Cognition normal.        Judgment: Judgment is impulsive.    Review of Systems  Constitutional:        No other complaints voiced  Musculoskeletal:  Positive for back pain and myalgias.  Psychiatric/Behavioral:  Positive for depression, substance abuse and suicidal ideas (Denies). Nervous/anxious: Stable.        Reports main stressor for suicidal ideation is his pain and homelessness  All other systems reviewed and are negative.  Blood pressure 136/71, pulse 77, temperature 97.8 F (36.6 C), resp. rate 16, height 6\' 2"  (1.88 m), weight 74.8 kg, SpO2 98%. Body mass index is 21.18 kg/m.  Treatment Plan Summary: Daily contact with patient to assess and evaluate symptoms and progress in treatment, Medication management, and Plan Inpatient psychiatric treatment  Disposition: Recommend psychiatric Inpatient admission when medically cleared.  This service was provided via telemedicine using a 2-way, interactive audio and video technology.  Names of all persons participating in this telemedicine service and their role in this encounter. Name: Assunta Found Role: NP  Name: Jeffery Gross Role: Patient  Name:  Role:   Name:  Role:     Assunta Found, NP 02/03/2023 9:40 AM

## 2023-02-04 NOTE — ED Provider Notes (Signed)
Emergency Medicine Observation Re-evaluation Note  Jeffery Gross is a 62 y.o. male, seen on rounds today.  Pt initially presented to the ED for complaints of Suicidal Currently, the patient is sitting in his chair. He's upset about the potential of going to [facility].  Physical Exam  BP 101/66 (BP Location: Left Arm)   Pulse 62   Temp 97.7 F (36.5 C)   Resp 14   Ht 6\' 2"  (1.88 m)   Wt 74.8 kg   SpO2 94%   BMI 21.18 kg/m  Physical Exam General: upset Lungs: normal effort Psych: suicidal  ED Course / MDM  EKG:EKG Interpretation Date/Time:  Sunday February 01 2023 03:49:00 EDT Ventricular Rate:  74 PR Interval:  156 QRS Duration:  100 QT Interval:  382 QTC Calculation: 424 R Axis:   -67  Text Interpretation: Normal sinus rhythm Left anterior fascicular block Nonspecific T wave abnormality Abnormal ECG When compared with ECG of 04-Dec-2022 17:27, No significant change was found Confirmed by Kennis Carina [id number]) on 02/01/2023 7:50:26 AM  I have reviewed the labs performed to date as well as medications administered while in observation.  Recent changes in the last 24 hours include oxycodone being scheduled prn.  Plan  Current plan is for transfer to [facility] this AM.    Pricilla Loveless, MD 02/04/23 [phone]

## 2023-02-04 NOTE — ED Notes (Signed)
Re-attempted call for report over to [facility], and they cannot take report at this time

## 2023-02-04 NOTE — Progress Notes (Signed)
Pt was accepted to [facility] 02/04/2023; Bed Assignment Main [facility]   Pt meets inpatient criteria per [facility] Rankin,NP     Attending Physician will be Dr. Loni Beckwith    Report can be called to:[phone]-Pager number, please leave a returned phone number to receive a phone call back.    Pt can arrive after 9:00am   Care Team notified: Corrie Mckusick   [name], MSW, [facility] 02/04/2023 1:11 AM

## 2023-02-04 NOTE — ED Notes (Signed)
Pt ate his breakfast

## 2023-02-04 NOTE — ED Notes (Signed)
4 bags of belongings and w/c gathered for travel. Nurse got all pt home meds form pharmacy placed into the bags. Staff is waiting on transportation.

## 2023-02-04 NOTE — ED Notes (Signed)
2 knives given back to pt from security and placed into black bag per pt request. Pt leaving with safe hands at this time.

## 2023-02-04 NOTE — Progress Notes (Signed)
LCSW Progress Note  [id number]   Jeffery Gross  02/04/2023  12:48 AM    Inpatient Behavioral Health Placement  Pt meets inpatient criteria per Shuvon Rankin,NP. There are no available beds within [facility] per [facility] Kim Brooks,RN. Referral was sent to the following facilities;   Destination  Service Provider Address Phone Fax  [facility]  [address] [place] [address] [phone]  [facility] [address]  [address], [place] [address] [phone]  [facility]  [address] [place], [place] [address] [phone]  [facility]  [address] [place] [address] [phone]  [facility]  [address] [place] [address] [phone]  [facility]  [address] [place], [place] [address] [phone]  [facility]  [address] [place] [address] [phone]  [facility]  [address] [place] [address] [phone]  [facility]  [address] [place] [address] [phone]  [facility]  [address] [place], [place] [address] [phone]  [facility]  [address], [place] [address] [phone]  [facility]  [address], [place] [address] [phone]  [facility]  [address] [place] [address] [phone]  [facility]  [address] [place] [address] [phone]  [facility]  [address] [place], [place] [address] [phone]  [facility]  [address] [place] [address] [phone]  [facility]  [address] [place] [address] [phone]  [facility]  [address] [place] [address] [phone]  [facility]  [address] [place] [address] [phone]  [facility]  [address] [place] [address] [phone]  [facility]  [address] [place] [address] [phone]  [facility]  [address] [place] [address] [phone]  [facility]  [address] [place] [address] [phone]  [facility]  [address] [place] [address] [phone]  [facility]  [address] [place] [address] [phone]  [facility]  [address] [place] [address] [phone]  [facility]  [address] [place] [address] [phone]  [facility]  [address] [place] [address] [phone]  [facility]  [address] [place] [address] [phone]  [facility] [address] [place]  [address] [place] [address] [phone]  [facility] [place] [address] [phone]    Situation ongoing,  CSW will follow up.    [name], MSW, [facility] 02/04/2023 12:48 AM

## 2023-02-04 NOTE — ED Notes (Signed)
This RN placed call to [facility] [phone]) to give report, and left call back number.

## 2023-12-27 ENCOUNTER — Ambulatory Visit
Admission: EM | Admit: 2023-12-27 | Discharge: 2023-12-27 | Disposition: A | Discharge status: Home / Self Care | Attending: Nurse Practitioner | Admitting: Nurse Practitioner

## 2023-12-27 DIAGNOSIS — L03114 Cellulitis of left upper limb: Secondary | ICD-10-CM

## 2023-12-27 MED ORDER — DOXYCYCLINE HYCLATE 100 MG PO TABS: 100.0000 mg | ORAL_TABLET | Freq: Two times a day (BID) | ORAL | 0 refills | Status: AC

## 2023-12-27 NOTE — ED Provider Notes (Signed)
 [facility]    CSN: [id number] Arrival date & time: 12/27/23  1458      History   Chief Complaint No chief complaint on file.   HPI Jeffery Gross is a 63 y.o. male.   The history is provided by the patient.   Patient presents for complaints of swelling, redness, and an insect bite to the left elbow.  Symptoms have been present for the past 3 days.  Patient states that the area has started to drain.  He states that he did notice that he was bitten by something, but is unsure what he may have been bitten by.  Patient states that he is at increased swelling and redness over the past day or so.  Denies fever, chills, chest pain, abdominal pain, nausea, vomiting, diarrhea, or rash.  He has taken Suboxone for pain. 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   Cocaine abuse (HCC) 01/08/2023   Opiate dependence (HCC) 01/08/2023   Homelessness 01/08/2023   Suicidal ideation 12/04/2022   Substance induced mood disorder (HCC) 12/04/2022   Cocaine use disorder (HCC) 12/04/2022   Amputation stump infection (HCC) 08/06/2022   Cocaine use 08/06/2022   Opioid dependence (HCC) 08/06/2022   Tobacco use disorder 07/13/2022   Substance abuse (HCC) 07/13/2022   Cellulitis 07/12/2022   GERD (gastroesophageal reflux disease) 07/12/2022   AKA stump complication (HCC) 07/05/2022   Wound dehiscence 07/04/2022   Hx of AKA (above knee amputation), right (HCC) 07/04/2022   History of MRSA infection 06/14/2022   Cellulitis of right lower extremity 06/14/2022   Infected wound  06/13/2022   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   Seasonal allergies 02/08/2016   Other specified anemias 02/08/2016   Chronic pain syndrome 02/08/2016   Depression 02/08/2016   Post-operative pain    Ischemia of lower extremity 01/23/2016   Hypoalbuminemia due to protein-calorie malnutrition (HCC)    Dysuria    Constipation due to pain medication    Chronic low back pain    Benign essential HTN    Asthma    Hyponatremia    CKD (chronic kidney disease)    Anemia of chronic disease    Transaminitis    Phantom limb syndrome with pain (HCC)    S/P AKA (above knee amputation) bilateral (HCC)    Acute kidney injury (HCC)    Syncope    Pressure ulcer 01/12/2016   Fall    Encounter for central line placement    Urinary retention    AKI (acute kidney injury) (HCC)    Traumatic rhabdomyolysis (HCC)    Rhabdomyolysis 01/03/2016   Nontraumatic compartment syndrome of leg 01/03/2016   Chest pain 10/27/2012   HTN (hypertension) 10/27/2012   Back pain 10/27/2012    Past Surgical History:  Procedure Laterality Date   AMPUTATION Bilateral 01/09/2016   Procedure: Bilateral Above Knee Amputation, Apply Wound VAC;  Surgeon: Timothy Ford, MD;  Location: [facility] OR;  Service: Orthopedics;  Laterality: Bilateral;   AMPUTATION Right 08/09/2022   Procedure: RIGHT  REVISION AMPUTATION ABOVE KNEE;  Surgeon: Timothy Ford, MD;  Location: [facility] OR;  Service: Orthopedics;  Laterality: Right;   FASCIOTOMY Bilateral 01/03/2016   Procedure: FASCIOTOMY;  Surgeon: Wes Hamman, MD;  Location: [facility] OR;  Service: Orthopedics;  Laterality: Bilateral;   I & D EXTREMITY Bilateral 01/05/2016   Procedure: IRRIGATION AND DEBRIDEMENT BILATERAL LOWER EXTREMITIES; WOUND VAC CHANGE;  Surgeon: Wes Hamman, MD;  Location: [facility] OR;  Service: Orthopedics;  Laterality: Bilateral;   I & D EXTREMITY Left 01/07/2016   Procedure: Irrigation and Debridement of Left Lower Extremity with Application of Wound Vac;  Surgeon: Wes Hamman, MD;   Location: [facility] OR;  Service: Orthopedics;  Laterality: Left;   STUMP REVISION Right 07/04/2022   Procedure: REVISION RIGHT ABOVE KNEE AMPUTATION;  Surgeon: Timothy Ford, MD;  Location: [facility] OR;  Service: Orthopedics;  Laterality: Right;   VASECTOMY         Home Medications    Prior to Admission medications   Medication Sig Start Date End Date Taking? Authorizing Provider  doxepin  (SINEQUAN ) 100 MG capsule Take 1 capsule (100 mg total) by mouth at bedtime. 01/20/23   Juliana Ocean, DO  DULoxetine  (CYMBALTA ) 60 MG capsule Take 1 capsule (60 mg total) by mouth in the morning. 01/21/23   Juliana Ocean, DO  gabapentin  (NEURONTIN ) 300 MG capsule Take 1 capsule (300 mg total) by mouth 3 (three) times daily. 01/20/23   Juliana Ocean, DO  omeprazole (PRILOSEC) 20 MG capsule Take 20 mg by mouth in the morning. Patient not taking: Reported on 02/01/2023 03/19/22   [provider]  traZODone  (DESYREL ) 100 MG tablet Take 1 tablet (100 mg total) by mouth at bedtime as needed for sleep. 01/20/23   Juliana Ocean, DO    Family History Family History  Problem Relation Age of Onset   Hypertension Father     Social History Social History   Tobacco Use   Smoking status: Every Day    Current packs/day: 0.25    Average packs/day: 0.3 packs/day for 5.0 years (1.3 ttl pk-yrs)    Types: Cigarettes   Smokeless tobacco: Current  Vaping Use   Vaping status: Every Day   Substances: Nicotine   Substance Use Topics   Alcohol use: Yes    Comment: rare   Drug use: Yes    Types: Cocaine     Allergies   Sulfacetamide sodium   Review of Systems Review of Systems Per HPI  Physical Exam Triage Vital Signs ED Triage Vitals  Encounter Vitals Group     BP 12/27/23 1506 (!) 148/89     Systolic BP Percentile --      Diastolic BP Percentile --      Pulse Rate 12/27/23 1506 91     Resp 12/27/23 1506 18     Temp 12/27/23 1506 98.2 F (36.8 C)     Temp Source  12/27/23 1506 Oral     SpO2 12/27/23 1506 98 %     Weight --      Height --      Head Circumference --      Peak Flow --      Pain Score 12/27/23 1507 5     Pain Loc --      Pain Education --      Exclude from Growth Chart --    No data found.  Updated Vital Signs BP (!) 148/89 (BP Location: Right Arm)   Pulse 91  Temp 98.2 F (36.8 C) (Oral)   Resp 18   SpO2 98%   Visual Acuity Right Eye Distance:   Left Eye Distance:   Bilateral Distance:    Right Eye Near:   Left Eye Near:    Bilateral Near:     Physical Exam Vitals and nursing note reviewed.  Constitutional:      General: He is not in acute distress.    Appearance: Normal appearance.  HENT:     Head: Normocephalic.  Eyes:     Extraocular Movements: Extraocular movements intact.     Pupils: Pupils are equal, round, and reactive to light.  Cardiovascular:     Rate and Rhythm: Normal rate and regular rhythm.     Pulses: Normal pulses.     Heart sounds: Normal heart sounds.  Pulmonary:     Effort: Pulmonary effort is normal.     Breath sounds: Normal breath sounds.  Musculoskeletal:     Left elbow: Swelling present.     Cervical back: Normal range of motion.     Comments: Warmth, swelling, and erythema noted to the left elbow. Escharotic lesion above the olecranon process.  There is no oozing, fluctuance, or drainage present.  Skin:    General: Skin is warm and dry.  Neurological:     General: No focal deficit present.     Mental Status: He is alert and oriented to person, place, and time.  Psychiatric:        Mood and Affect: Mood normal.        Behavior: Behavior normal.      UC Treatments / Results  Labs (all labs ordered are listed, but only abnormal results are displayed) Labs Reviewed - No data to display  EKG   Radiology No results found.  Procedures Procedures (including critical care time)  Medications Ordered in UC Medications - No data to display  Initial Impression /  Assessment and Plan / UC Course  I have reviewed the triage vital signs and the nursing notes.  Pertinent labs & imaging results that were available during my care of the patient were reviewed by me and considered in my medical decision making (see chart for details).  Will treat for cellulitis of the left elbow with doxycycline  100 mg twice daily for the next 7 days.  Supportive care recommendations were provided and discussed with the patient to include over-the-counter Tylenol , cleansing the area twice daily, fluids, and rest.  Discussed indications with patient regarding follow-up.  Patient was in agreement with this plan of care and verbalizes understanding.  All questions were answered.  Patient stable for discharge.   Final Clinical Impressions(s) / UC Diagnoses   Final diagnoses:  None   Discharge Instructions   None    ED Prescriptions   None    PDMP not reviewed this encounter.   Hardy Lia, NP 12/27/23 1523

## 2023-12-27 NOTE — Discharge Instructions (Addendum)
 Take medication as prescribed. You may take over-the-counter Tylenol  as needed for pain, fever, or general discomfort.   Do not pick or disrupt the area while symptoms are persistent. Warm compresses to the affected area 3-4 times daily. Go to the emergency department if you develop fever, chills, generalized fatigue, nausea, vomiting, or if the area of redness spreads up or down the arm. Follow-up with your primary care physician within the next 7 to 10 days for reevaluation. Follow-up as needed.

## 2023-12-27 NOTE — ED Triage Notes (Signed)
 Pt reports he has a red open sore on his left elbow x 3 days    Took a subaxone

## 2024-01-06 ENCOUNTER — Ambulatory Visit
Admission: EM | Admit: 2024-01-06 | Discharge: 2024-01-06 | Disposition: A | Discharge status: Home / Self Care | Attending: Family Medicine | Admitting: Family Medicine

## 2024-01-06 DIAGNOSIS — L03114 Cellulitis of left upper limb: Secondary | ICD-10-CM

## 2024-01-06 MED ORDER — MUPIROCIN 2 % EX OINT: 1.0000 | TOPICAL_OINTMENT | Freq: Two times a day (BID) | CUTANEOUS | 0 refills | Status: AC

## 2024-01-06 MED ORDER — CHLORHEXIDINE GLUCONATE 4 % EX SOLN: Freq: Every day | CUTANEOUS | 0 refills | Status: AC | PRN

## 2024-01-06 MED ORDER — DOXYCYCLINE HYCLATE 100 MG PO CAPS: 100.0000 mg | ORAL_CAPSULE | Freq: Two times a day (BID) | ORAL | 0 refills | Status: AC

## 2024-01-06 NOTE — ED Provider Notes (Signed)
 [facility]    CSN: [id number] Arrival date & time: 01/06/24  1604      History   Chief Complaint Chief Complaint  Patient presents with   Wound Infection    HPI Jeffery Gross is a 63 y.o. male.   Patient presenting today following up on visit from 12/27/2023 for infected insect bite to the left elbow.  Took the course of doxycycline  and states he did see improvement but since being off of it the wound has worsened and is oozing yellow drainage.  He has been cleaning it with peroxide and letting it be open with no relief.  Denies fever, chills, numbness, tingling, decreased range of motion.    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   Cocaine abuse (HCC) 01/08/2023   Opiate dependence (HCC) 01/08/2023   Homelessness 01/08/2023   Suicidal ideation 12/04/2022   Substance induced mood disorder (HCC) 12/04/2022   Cocaine use disorder (HCC) 12/04/2022   Amputation stump infection (HCC) 08/06/2022   Cocaine use 08/06/2022   Opioid dependence (HCC) 08/06/2022   Tobacco use disorder 07/13/2022   Substance abuse (HCC) 07/13/2022   Cellulitis 07/12/2022   GERD (gastroesophageal reflux disease) 07/12/2022   AKA stump complication (HCC) 07/05/2022   Wound dehiscence 07/04/2022   Hx of AKA (above knee amputation), right (HCC) 07/04/2022   History of MRSA infection 06/14/2022   Cellulitis of right lower extremity 06/14/2022   Infected wound 06/13/2022   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   Seasonal allergies 02/08/2016   Other specified anemias 02/08/2016    Chronic pain syndrome 02/08/2016   Depression 02/08/2016   Post-operative pain    Ischemia of lower extremity 01/23/2016   Hypoalbuminemia due to protein-calorie malnutrition (HCC)    Dysuria    Constipation due to pain medication    Chronic low back pain    Benign essential HTN    Asthma    Hyponatremia    CKD (chronic kidney disease)    Anemia of chronic disease    Transaminitis    Phantom limb syndrome with pain (HCC)    S/P AKA (above knee amputation) bilateral (HCC)    Acute kidney injury (HCC)    Syncope    Pressure ulcer 01/12/2016   Fall    Encounter for central line placement    Urinary retention    AKI (acute kidney injury) (HCC)    Traumatic rhabdomyolysis (HCC)    Rhabdomyolysis 01/03/2016   Nontraumatic compartment syndrome of leg 01/03/2016   Chest pain 10/27/2012   HTN (hypertension) 10/27/2012   Back pain 10/27/2012    Past Surgical History:  Procedure Laterality Date   AMPUTATION Bilateral 01/09/2016   Procedure: Bilateral Above Knee Amputation, Apply Wound VAC;  Surgeon: Timothy Ford, MD;  Location: [facility] OR;  Service: Orthopedics;  Laterality: Bilateral;   AMPUTATION Right 08/09/2022   Procedure: RIGHT REVISION AMPUTATION ABOVE KNEE;  Surgeon: Timothy Ford, MD;  Location: [facility] OR;  Service: Orthopedics;  Laterality: Right;   FASCIOTOMY Bilateral 01/03/2016  Procedure: FASCIOTOMY;  Surgeon: Wes Hamman, MD;  Location: [facility] OR;  Service: Orthopedics;  Laterality: Bilateral;   I & D EXTREMITY Bilateral 01/05/2016   Procedure: IRRIGATION AND DEBRIDEMENT BILATERAL LOWER EXTREMITIES; WOUND VAC CHANGE;  Surgeon: Wes Hamman, MD;  Location: [facility] OR;  Service: Orthopedics;  Laterality: Bilateral;   I & D EXTREMITY Left 01/07/2016   Procedure: Irrigation and Debridement of Left Lower Extremity with Application of Wound Vac;  Surgeon: Wes Hamman, MD;  Location: [facility] OR;  Service: Orthopedics;  Laterality: Left;   STUMP REVISION Right 07/04/2022   Procedure: REVISION RIGHT  ABOVE KNEE AMPUTATION;  Surgeon: Timothy Ford, MD;  Location: [facility] OR;  Service: Orthopedics;  Laterality: Right;   VASECTOMY         Home Medications    Prior to Admission medications   Medication Sig Start Date End Date Taking? Authorizing Provider  chlorhexidine  (HIBICLENS ) 4 % external liquid Apply topically daily as needed. 01/06/24  Yes Corbin Dess, PA-C  doxycycline  (VIBRAMYCIN ) 100 MG capsule Take 1 capsule (100 mg total) by mouth 2 (two) times daily. 01/06/24  Yes Corbin Dess, PA-C  mupirocin  ointment (BACTROBAN ) 2 % Apply 1 Application topically 2 (two) times daily. 01/06/24  Yes Corbin Dess, PA-C  doxepin  (SINEQUAN ) 100 MG capsule Take 1 capsule (100 mg total) by mouth at bedtime. 01/20/23   Juliana Ocean, DO  DULoxetine  (CYMBALTA ) 60 MG capsule Take 1 capsule (60 mg total) by mouth in the morning. 01/21/23   Juliana Ocean, DO  gabapentin  (NEURONTIN ) 300 MG capsule Take 1 capsule (300 mg total) by mouth 3 (three) times daily. 01/20/23   Juliana Ocean, DO  omeprazole (PRILOSEC) 20 MG capsule Take 20 mg by mouth in the morning. Patient not taking: Reported on 02/01/2023 03/19/22   [provider]  traZODone  (DESYREL ) 100 MG tablet Take 1 tablet (100 mg total) by mouth at bedtime as needed for sleep. 01/20/23   Juliana Ocean, DO    Family History Family History  Problem Relation Age of Onset   Hypertension Father     Social History Social History   Tobacco Use   Smoking status: Every Day    Current packs/day: 0.25    Average packs/day: 0.3 packs/day for 5.0 years (1.3 ttl pk-yrs)    Types: Cigarettes   Smokeless tobacco: Current  Vaping Use   Vaping status: Every Day   Substances: Nicotine   Substance Use Topics   Alcohol use: Yes    Comment: rare   Drug use: Yes    Types: Cocaine     Allergies   Sulfacetamide sodium   Review of Systems Review of Systems Per HPI  Physical Exam Triage  Vital Signs ED Triage Vitals  Encounter Vitals Group     BP 01/06/24 1629 120/80     Girls Systolic BP Percentile --      Girls Diastolic BP Percentile --      Boys Systolic BP Percentile --      Boys Diastolic BP Percentile --      Pulse Rate 01/06/24 1629 75     Resp 01/06/24 1629 16     Temp 01/06/24 1629 98.1 F (36.7 C)     Temp Source 01/06/24 1629 Oral     SpO2 01/06/24 1629 93 %     Weight --      Height --      Head Circumference --      Peak  Flow --      Pain Score 01/06/24 1626 0     Pain Loc --      Pain Education --      Exclude from Growth Chart --    No data found.  Updated Vital Signs BP 120/80 (BP Location: Left Arm)   Pulse 75   Temp 98.1 F (36.7 C) (Oral)   Resp 16   SpO2 93%   Visual Acuity Right Eye Distance:   Left Eye Distance:   Bilateral Distance:    Right Eye Near:   Left Eye Near:    Bilateral Near:     Physical Exam Vitals and nursing note reviewed.  Constitutional:      Appearance: Normal appearance.  HENT:     Head: Atraumatic.     Mouth/Throat:     Mouth: Mucous membranes are moist.   Eyes:     Extraocular Movements: Extraocular movements intact.     Conjunctiva/sclera: Conjunctivae normal.    Cardiovascular:     Rate and Rhythm: Normal rate.  Pulmonary:     Effort: Pulmonary effort is normal.   Musculoskeletal:        General: No swelling or tenderness. Normal range of motion.     Cervical back: Normal range of motion and neck supple.   Skin:    General: Skin is warm.     Comments: 1 inch diameter ulceration with thick yellow drainage, scabbing, mild erythema to edges to the left elbow   Neurological:     General: No focal deficit present.     Mental Status: He is oriented to person, place, and time.     Comments: Left upper extremity neurovascularly intact  Psychiatric:        Mood and Affect: Mood normal.        Thought Content: Thought content normal.        Judgment: Judgment normal.      UC  Treatments / Results  Labs (all labs ordered are listed, but only abnormal results are displayed) Labs Reviewed - No data to display  EKG   Radiology No results found.  Procedures Procedures (including critical care time)  Medications Ordered in UC Medications - No data to display  Initial Impression / Assessment and Plan / UC Course  I have reviewed the triage vital signs and the nursing notes.  Pertinent labs & imaging results that were available during my care of the patient were reviewed by me and considered in my medical decision making (see chart for details).     Persistent infection, will refill doxycycline  and switch from peroxide to Hibiclens , mupirocin , keep covered at all times.  Discussed good home wound care, return precautions.  Final Clinical Impressions(s) / UC Diagnoses   Final diagnoses:  Cellulitis of left arm     Discharge Instructions      With the Hibiclens  solution and apply the mupirocin  ointment.  Keep the area covered with nonstick gauze and Coban wrap all day every day until fully healed.  Take the full course of doxycycline  and follow-up if not resolving.    ED Prescriptions     Medication Sig Dispense Auth. Provider   doxycycline  (VIBRAMYCIN ) 100 MG capsule Take 1 capsule (100 mg total) by mouth 2 (two) times daily. 14 capsule Corbin Dess, PA-C   chlorhexidine  (HIBICLENS ) 4 % external liquid Apply topically daily as needed. 236 mL Corbin Dess, PA-C   mupirocin  ointment (BACTROBAN ) 2 % Apply 1 Application topically 2 (  two) times daily. 22 g Corbin Dess, [place]      PDMP not reviewed this encounter.   Corbin Dess, [place] 01/06/24 [phone]

## 2024-01-06 NOTE — Discharge Instructions (Signed)
 With the Hibiclens  solution and apply the mupirocin  ointment.  Keep the area covered with nonstick gauze and Coban wrap all day every day until fully healed.  Take the full course of doxycycline  and follow-up if not resolving.

## 2024-01-06 NOTE — ED Triage Notes (Signed)
 Pt presents to UC for c/o left elbow swelling after insect bite. Pt was given doxycycline  and he finished the entire course. Pt states it continues to ooze.

## 2024-02-12 ENCOUNTER — Ambulatory Visit
Admission: EM | Admit: 2024-02-12 | Discharge: 2024-02-12 | Disposition: A | Discharge status: Home / Self Care | Attending: Family Medicine | Admitting: Family Medicine

## 2024-02-12 DIAGNOSIS — L02414 Cutaneous abscess of left upper limb: Secondary | ICD-10-CM

## 2024-02-12 DIAGNOSIS — S50862A Insect bite (nonvenomous) of left forearm, initial encounter: Secondary | ICD-10-CM | POA: Diagnosis not present

## 2024-02-12 DIAGNOSIS — W57XXXA Bitten or stung by nonvenomous insect and other nonvenomous arthropods, initial encounter: Secondary | ICD-10-CM | POA: Diagnosis not present

## 2024-02-12 MED ORDER — DOXYCYCLINE HYCLATE 100 MG PO CAPS: 100.0000 mg | ORAL_CAPSULE | Freq: Two times a day (BID) | ORAL | 0 refills | Status: AC

## 2024-02-12 NOTE — ED Triage Notes (Signed)
 Pt reports spider bite on left  forearm

## 2024-02-12 NOTE — ED Provider Notes (Signed)
 [facility]    CSN: [id number] Arrival date & time: 02/12/24  1603      History   Chief Complaint No chief complaint on file.   HPI Jeffery Gross is a 63 y.o. male.   Patient presenting today with several day history of progressively worsening spider bite to the left forearm.  Has become increasingly more red, swollen and has had some purulent drainage.  Denies fever, chills, body aches.  Has been cleaning with Hibiclens  and apply mupirocin  ointment with no relief.    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   Cocaine abuse (HCC) 01/08/2023   Opiate dependence (HCC) 01/08/2023   Homelessness 01/08/2023   Suicidal ideation 12/04/2022   Substance induced mood disorder (HCC) 12/04/2022   Cocaine use disorder (HCC) 12/04/2022   Amputation stump infection (HCC) 08/06/2022   Cocaine use 08/06/2022   Opioid dependence (HCC) 08/06/2022   Tobacco use disorder 07/13/2022   Substance abuse (HCC) 07/13/2022   Cellulitis 07/12/2022   GERD (gastroesophageal reflux disease) 07/12/2022   AKA stump complication (HCC) 07/05/2022   Wound dehiscence 07/04/2022   Hx of AKA (above knee amputation), right (HCC) 07/04/2022   History of MRSA infection 06/14/2022   Cellulitis of right lower extremity 06/14/2022   Infected wound 06/13/2022   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   Seasonal allergies 02/08/2016   Other specified anemias 02/08/2016   Chronic pain syndrome 02/08/2016   Depression 02/08/2016   Post-operative pain    Ischemia of lower extremity 01/23/2016    Hypoalbuminemia due to protein-calorie malnutrition (HCC)    Dysuria    Constipation due to pain medication    Chronic low back pain    Benign essential HTN    Asthma    Hyponatremia    CKD (chronic kidney disease)    Anemia of chronic disease    Transaminitis    Phantom limb syndrome with pain (HCC)    S/P AKA (above knee amputation) bilateral (HCC)    Acute kidney injury (HCC)    Syncope    Pressure ulcer 01/12/2016   Fall    Encounter for central line placement    Urinary retention    AKI (acute kidney injury) (HCC)    Traumatic rhabdomyolysis (HCC)    Rhabdomyolysis 01/03/2016   Nontraumatic compartment syndrome of leg 01/03/2016   Chest pain 10/27/2012   HTN (hypertension) 10/27/2012   Back pain 10/27/2012    Past Surgical History:  Procedure Laterality Date   AMPUTATION Bilateral 01/09/2016   Procedure: Bilateral Above Knee Amputation, Apply Wound VAC;  Surgeon: Jerona LULLA Sage, MD;  Location: [facility] OR;  Service: Orthopedics;  Laterality: Bilateral;   AMPUTATION Right 08/09/2022   Procedure: RIGHT REVISION AMPUTATION ABOVE KNEE;  Surgeon: Sage Jerona LULLA, MD;  Location: [facility] OR;  Service: Orthopedics;  Laterality: Right;   FASCIOTOMY Bilateral 01/03/2016   Procedure: FASCIOTOMY;  Surgeon: Kay CHRISTELLA Cummins, MD;  Location: [facility] OR;  Service: Orthopedics;  Laterality: Bilateral;   I & D EXTREMITY Bilateral 01/05/2016  Procedure: IRRIGATION AND DEBRIDEMENT BILATERAL LOWER EXTREMITIES; WOUND VAC CHANGE;  Surgeon: Kay CHRISTELLA Cummins, MD;  Location: [facility] OR;  Service: Orthopedics;  Laterality: Bilateral;   I & D EXTREMITY Left 01/07/2016   Procedure: Irrigation and Debridement of Left Lower Extremity with Application of Wound Vac;  Surgeon: Kay CHRISTELLA Cummins, MD;  Location: [facility] OR;  Service: Orthopedics;  Laterality: Left;   STUMP REVISION Right 07/04/2022   Procedure: REVISION RIGHT ABOVE KNEE AMPUTATION;  Surgeon: Harden Jerona GAILS, MD;  Location: [facility] OR;  Service: Orthopedics;  Laterality: Right;   VASECTOMY          Home Medications    Prior to Admission medications   Medication Sig Start Date End Date Taking? Authorizing Provider  doxycycline  (VIBRAMYCIN ) 100 MG capsule Take 1 capsule (100 mg total) by mouth 2 (two) times daily. 02/12/24  Yes Stuart Vernell Norris, PA-C  chlorhexidine  (HIBICLENS ) 4 % external liquid Apply topically daily as needed. 01/06/24   Stuart Vernell Norris, PA-C  doxepin  (SINEQUAN ) 100 MG capsule Take 1 capsule (100 mg total) by mouth at bedtime. 01/20/23   Cam Charlie Loving, DO  doxycycline  (VIBRAMYCIN ) 100 MG capsule Take 1 capsule (100 mg total) by mouth 2 (two) times daily. 01/06/24   Stuart Vernell Norris, PA-C  DULoxetine  (CYMBALTA ) 60 MG capsule Take 1 capsule (60 mg total) by mouth in the morning. 01/21/23   Cam Charlie Loving, DO  gabapentin  (NEURONTIN ) 300 MG capsule Take 1 capsule (300 mg total) by mouth 3 (three) times daily. 01/20/23   Cam Charlie Loving, DO  mupirocin  ointment (BACTROBAN ) 2 % Apply 1 Application topically 2 (two) times daily. 01/06/24   Stuart Vernell Norris, PA-C  omeprazole (PRILOSEC) 20 MG capsule Take 20 mg by mouth in the morning. Patient not taking: Reported on 02/01/2023 03/19/22   [provider]  traZODone  (DESYREL ) 100 MG tablet Take 1 tablet (100 mg total) by mouth at bedtime as needed for sleep. 01/20/23   Cam Charlie Loving, DO    Family History Family History  Problem Relation Age of Onset   Hypertension Father     Social History Social History   Tobacco Use   Smoking status: Every Day    Current packs/day: 0.25    Average packs/day: 0.3 packs/day for 5.0 years (1.3 ttl pk-yrs)    Types: Cigarettes   Smokeless tobacco: Current  Vaping Use   Vaping status: Every Day   Substances: Nicotine   Substance Use Topics   Alcohol use: Yes    Comment: rare   Drug use: Yes    Types: Cocaine     Allergies   Sulfacetamide sodium   Review of Systems Review of Systems PER HPI  Physical  Exam Triage Vital Signs ED Triage Vitals  Encounter Vitals Group     BP 02/12/24 1625 122/75     Girls Systolic BP Percentile --      Girls Diastolic BP Percentile --      Boys Systolic BP Percentile --      Boys Diastolic BP Percentile --      Pulse Rate 02/12/24 1625 78     Resp 02/12/24 1625 20     Temp 02/12/24 1625 98.2 F (36.8 C)     Temp Source 02/12/24 1625 Oral     SpO2 02/12/24 1625 94 %     Weight --      Height --      Head Circumference --      Peak Flow --  Pain Score 02/12/24 1626 0     Pain Loc --      Pain Education --      Exclude from Growth Chart --    No data found.  Updated Vital Signs BP 122/75 (BP Location: Right Arm)   Pulse 78   Temp 98.2 F (36.8 C) (Oral)   Resp 20   SpO2 94%   Visual Acuity Right Eye Distance:   Left Eye Distance:   Bilateral Distance:    Right Eye Near:   Left Eye Near:    Bilateral Near:     Physical Exam Vitals and nursing note reviewed.  Constitutional:      Appearance: Normal appearance.  HENT:     Head: Atraumatic.     Mouth/Throat:     Mouth: Mucous membranes are moist.  Eyes:     Extraocular Movements: Extraocular movements intact.     Conjunctiva/sclera: Conjunctivae normal.  Cardiovascular:     Rate and Rhythm: Normal rate.  Pulmonary:     Effort: Pulmonary effort is normal.  Musculoskeletal:     Cervical back: Normal range of motion and neck supple.  Skin:    General: Skin is warm.     Comments: Erythematous and edematous abscess to the left forearm, dried yellow drainage to area  Neurological:     Mental Status: He is oriented to person, place, and time.     Comments: Left upper extremity neurovascularly intact  Psychiatric:        Mood and Affect: Mood normal.        Thought Content: Thought content normal.        Judgment: Judgment normal.      UC Treatments / Results  Labs (all labs ordered are listed, but only abnormal results are displayed) Labs Reviewed - No data to  display  EKG   Radiology No results found.  Procedures Procedures (including critical care time)  Medications Ordered in UC Medications - No data to display  Initial Impression / Assessment and Plan / UC Course  I have reviewed the triage vital signs and the nursing notes.  Pertinent labs & imaging results that were available during my care of the patient were reviewed by me and considered in my medical decision making (see chart for details).     Treat with doxycycline , Hibiclens , mupirocin  and keep covered.  Supportive home care and return precautions reviewed.  Final Clinical Impressions(s) / UC Diagnoses   Final diagnoses:  Abscess of left arm  Insect bite of left forearm, initial encounter   Discharge Instructions   None    ED Prescriptions     Medication Sig Dispense Auth. Provider   doxycycline  (VIBRAMYCIN ) 100 MG capsule Take 1 capsule (100 mg total) by mouth 2 (two) times daily. 20 capsule Stuart Vernell Norris, [place]      PDMP not reviewed this encounter.   Stuart Vernell Norris, [place] 02/12/24 1743
